# Patient Record
Sex: Male | Born: 1980 | Race: White | Hispanic: No | Smoking: Current every day smoker
Health system: Southern US, Community
[De-identification: ages and names within clinical notes are randomized; demographics above are authoritative.]

## PROBLEM LIST (undated history)

## (undated) ENCOUNTER — Emergency Department (HOSPITAL_COMMUNITY): Admission: EM | Payer: Medicaid Other | Source: Home / Self Care

## (undated) DIAGNOSIS — I10 Essential (primary) hypertension: Secondary | ICD-10-CM

## (undated) DIAGNOSIS — I213 ST elevation (STEMI) myocardial infarction of unspecified site: Secondary | ICD-10-CM

## (undated) DIAGNOSIS — E782 Mixed hyperlipidemia: Secondary | ICD-10-CM

## (undated) DIAGNOSIS — J45909 Unspecified asthma, uncomplicated: Secondary | ICD-10-CM

## (undated) DIAGNOSIS — I2510 Atherosclerotic heart disease of native coronary artery without angina pectoris: Secondary | ICD-10-CM

## (undated) DIAGNOSIS — C801 Malignant (primary) neoplasm, unspecified: Secondary | ICD-10-CM

## (undated) HISTORY — DX: Essential (primary) hypertension: I10

## (undated) HISTORY — PX: SPLENECTOMY: SUR1306

## (undated) HISTORY — DX: Mixed hyperlipidemia: E78.2

## (undated) HISTORY — PX: BACK SURGERY: SHX140

## (undated) HISTORY — PX: CARDIAC CATHETERIZATION: SHX172

## (undated) HISTORY — DX: Atherosclerotic heart disease of native coronary artery without angina pectoris: I25.10

---

## 2003-12-29 ENCOUNTER — Emergency Department (HOSPITAL_COMMUNITY): Admission: EM | Admit: 2003-12-29 | Discharge: 2003-12-29 | Payer: Self-pay | Admitting: Emergency Medicine

## 2004-06-20 ENCOUNTER — Emergency Department (HOSPITAL_COMMUNITY): Admission: EM | Admit: 2004-06-20 | Discharge: 2004-06-20 | Payer: Self-pay | Admitting: Emergency Medicine

## 2004-08-01 ENCOUNTER — Emergency Department (HOSPITAL_COMMUNITY): Admission: EM | Admit: 2004-08-01 | Discharge: 2004-08-01 | Payer: Self-pay | Admitting: Emergency Medicine

## 2004-10-21 ENCOUNTER — Emergency Department (HOSPITAL_COMMUNITY): Admission: EM | Admit: 2004-10-21 | Discharge: 2004-10-22 | Payer: Self-pay | Admitting: *Deleted

## 2005-04-06 ENCOUNTER — Emergency Department (HOSPITAL_COMMUNITY): Admission: EM | Admit: 2005-04-06 | Discharge: 2005-04-06 | Payer: Self-pay | Admitting: *Deleted

## 2005-07-22 ENCOUNTER — Emergency Department (HOSPITAL_COMMUNITY): Admission: EM | Admit: 2005-07-22 | Discharge: 2005-07-22 | Payer: Self-pay | Admitting: Emergency Medicine

## 2005-07-26 ENCOUNTER — Emergency Department (HOSPITAL_COMMUNITY): Admission: EM | Admit: 2005-07-26 | Discharge: 2005-07-26 | Payer: Self-pay | Admitting: Emergency Medicine

## 2005-07-29 ENCOUNTER — Emergency Department (HOSPITAL_COMMUNITY): Admission: EM | Admit: 2005-07-29 | Discharge: 2005-07-29 | Payer: Self-pay | Admitting: Emergency Medicine

## 2005-08-03 ENCOUNTER — Emergency Department (HOSPITAL_COMMUNITY): Admission: EM | Admit: 2005-08-03 | Discharge: 2005-08-03 | Payer: Self-pay | Admitting: Emergency Medicine

## 2005-11-15 ENCOUNTER — Emergency Department (HOSPITAL_COMMUNITY): Admission: EM | Admit: 2005-11-15 | Discharge: 2005-11-15 | Payer: Self-pay | Admitting: Emergency Medicine

## 2005-11-22 ENCOUNTER — Emergency Department (HOSPITAL_COMMUNITY): Admission: EM | Admit: 2005-11-22 | Discharge: 2005-11-23 | Payer: Self-pay | Admitting: Emergency Medicine

## 2008-06-22 ENCOUNTER — Emergency Department (HOSPITAL_COMMUNITY): Admission: EM | Admit: 2008-06-22 | Discharge: 2008-06-22 | Payer: Self-pay | Admitting: Emergency Medicine

## 2009-05-20 ENCOUNTER — Emergency Department (HOSPITAL_COMMUNITY): Admission: EM | Admit: 2009-05-20 | Discharge: 2009-05-20 | Payer: Self-pay | Admitting: Emergency Medicine

## 2009-05-27 ENCOUNTER — Emergency Department (HOSPITAL_COMMUNITY): Admission: EM | Admit: 2009-05-27 | Discharge: 2009-05-27 | Payer: Self-pay | Admitting: Emergency Medicine

## 2009-09-14 ENCOUNTER — Emergency Department (HOSPITAL_COMMUNITY): Admission: EM | Admit: 2009-09-14 | Discharge: 2009-09-14 | Payer: Self-pay | Admitting: Emergency Medicine

## 2010-04-21 ENCOUNTER — Ambulatory Visit (HOSPITAL_COMMUNITY): Admission: RE | Admit: 2010-04-21 | Discharge: 2010-04-21 | Payer: Self-pay | Admitting: Neurology

## 2010-06-14 ENCOUNTER — Inpatient Hospital Stay (HOSPITAL_COMMUNITY): Admission: RE | Admit: 2010-06-14 | Discharge: 2010-06-16 | Payer: Self-pay | Admitting: Neurosurgery

## 2010-09-16 ENCOUNTER — Emergency Department (HOSPITAL_COMMUNITY)
Admission: EM | Admit: 2010-09-16 | Discharge: 2010-09-16 | Payer: Self-pay | Source: Home / Self Care | Admitting: Emergency Medicine

## 2010-09-19 ENCOUNTER — Emergency Department (HOSPITAL_COMMUNITY)
Admission: EM | Admit: 2010-09-19 | Discharge: 2010-09-19 | Payer: Self-pay | Source: Home / Self Care | Admitting: Emergency Medicine

## 2010-12-31 LAB — CULTURE, BLOOD (ROUTINE X 2)
Culture: NO GROWTH
Culture: NO GROWTH

## 2010-12-31 LAB — CBC
HCT: 40.9 % (ref 39.0–52.0)
HCT: 45.3 % (ref 39.0–52.0)
Hemoglobin: 13.9 g/dL (ref 13.0–17.0)
Hemoglobin: 15.6 g/dL (ref 13.0–17.0)
MCH: 32.1 pg (ref 26.0–34.0)
MCH: 32.4 pg (ref 26.0–34.0)
MCHC: 34 g/dL (ref 30.0–36.0)
MCHC: 34.4 g/dL (ref 30.0–36.0)
MCV: 94.2 fL (ref 78.0–100.0)
MCV: 94.5 fL (ref 78.0–100.0)
Platelets: 258 10*3/uL (ref 150–400)
Platelets: 279 10*3/uL (ref 150–400)
RBC: 4.33 MIL/uL (ref 4.22–5.81)
RBC: 4.81 MIL/uL (ref 4.22–5.81)
RDW: 14.1 % (ref 11.5–15.5)
RDW: 14.1 % (ref 11.5–15.5)
WBC: 12.5 10*3/uL — ABNORMAL HIGH (ref 4.0–10.5)
WBC: 19 10*3/uL — ABNORMAL HIGH (ref 4.0–10.5)

## 2010-12-31 LAB — URINALYSIS, ROUTINE W REFLEX MICROSCOPIC
Bilirubin Urine: NEGATIVE
Glucose, UA: NEGATIVE mg/dL
Ketones, ur: NEGATIVE mg/dL
Leukocytes, UA: NEGATIVE
Nitrite: NEGATIVE
Protein, ur: 30 mg/dL — AB
Specific Gravity, Urine: 1.015 (ref 1.005–1.030)
Urobilinogen, UA: 1 mg/dL (ref 0.0–1.0)
pH: 7.5 (ref 5.0–8.0)

## 2010-12-31 LAB — URINE MICROSCOPIC-ADD ON

## 2010-12-31 LAB — SURGICAL PCR SCREEN
MRSA, PCR: NEGATIVE
Staphylococcus aureus: NEGATIVE

## 2010-12-31 LAB — BASIC METABOLIC PANEL
BUN: 4 mg/dL — ABNORMAL LOW (ref 6–23)
CO2: 23 mEq/L (ref 19–32)
Calcium: 8.5 mg/dL (ref 8.4–10.5)
Chloride: 107 mEq/L (ref 96–112)
Creatinine, Ser: 0.84 mg/dL (ref 0.4–1.5)
GFR calc Af Amer: >60 mL/min (ref >60)
GFR calc non Af Amer: >60 mL/min (ref >60)
Glucose, Bld: 124 mg/dL — ABNORMAL HIGH (ref 70–99)
Potassium: 3.5 mEq/L (ref 3.5–5.1)
Sodium: 135 mEq/L (ref 135–145)

## 2010-12-31 LAB — TYPE AND SCREEN
ABO/RH(D): O POS
Antibody Screen: NEGATIVE

## 2010-12-31 LAB — ABO/RH: ABO/RH(D): O POS

## 2011-02-07 ENCOUNTER — Emergency Department (HOSPITAL_COMMUNITY)
Admission: EM | Admit: 2011-02-07 | Discharge: 2011-02-08 | Disposition: A | Discharge status: Home / Self Care | Payer: Medicaid Other | Attending: Emergency Medicine | Admitting: Emergency Medicine

## 2011-02-07 ENCOUNTER — Emergency Department (HOSPITAL_COMMUNITY): Payer: Medicaid Other

## 2011-02-07 DIAGNOSIS — E669 Obesity, unspecified: Secondary | ICD-10-CM | POA: Insufficient documentation

## 2011-02-07 DIAGNOSIS — R091 Pleurisy: Secondary | ICD-10-CM | POA: Insufficient documentation

## 2011-02-07 DIAGNOSIS — J45909 Unspecified asthma, uncomplicated: Secondary | ICD-10-CM | POA: Insufficient documentation

## 2011-04-04 ENCOUNTER — Ambulatory Visit: Payer: Medicaid Other | Attending: Neurology

## 2011-04-04 DIAGNOSIS — G4730 Sleep apnea, unspecified: Secondary | ICD-10-CM | POA: Insufficient documentation

## 2011-04-04 DIAGNOSIS — G4710 Hypersomnia, unspecified: Secondary | ICD-10-CM | POA: Insufficient documentation

## 2011-04-04 DIAGNOSIS — Z6836 Body mass index (BMI) 36.0-36.9, adult: Secondary | ICD-10-CM | POA: Insufficient documentation

## 2011-04-06 NOTE — Procedures (Signed)
Nathan Waters, Nathan Waters               ACCOUNT NO.:  [id number]  MEDICAL RECORD NO.:  [id number]          PATIENT TYPE:  OUT  LOCATION:  SLEEP LAB                     FACILITY:  [facility]  PHYSICIAN:  Neleh Muldoon A. Gerilyn Pilgrim, M.D. DATE OF BIRTH:  24-Aug-1981  DATE OF STUDY:  04/04/2011                           NOCTURNAL POLYSOMNOGRAM  REFERRING PHYSICIAN:  Keva Darty  INDICATION FOR STUDY:  A 30 year old presents with snoring, fatigue, and insomnia.  This study has been done to evaluate for obstructive sleep apnea syndrome.  EPWORTH SLEEPINESS SCORE: 1. BMI 36.  MEDICATIONS:  Fioricet, Ventolin, Robaxin, and hydrocodone.  SLEEP ARCHITECTURE:  The total recording time is 401 minutes.  The sleep efficiency is 27.9%.  Sleep latency 36.5 minutes.  Sleep latency 0. Stage N1 9.4%, N2 46%, N3 44.6%, and REM sleep 0%.  RESPIRATORY DATA:  Baseline oxygen saturation 95, lowest saturation 89. Diagnostic AHI 0.  OXYGEN DATA:  CARDIAC DATA:  Average heart rate is 64 with no significant dysrhythmias observed.  MOVEMENT-PARASOMNIA:  PLM index 0.  IMPRESSIONS-RECOMMENDATIONS:  Abnormal sleep architecture with very poor sleep efficiency and no REM sleep.     Kerrigan Glendening A. Gerilyn Pilgrim, M.D. Electronically Signed 04/07/2011 09:33:40    KAD/MEDQ  D:  04/06/2011 07:43:40  T:  04/06/2011 08:31:38  Job:  [id number]

## 2011-04-19 ENCOUNTER — Other Ambulatory Visit: Payer: Self-pay | Admitting: Internal Medicine

## 2011-04-19 ENCOUNTER — Emergency Department (HOSPITAL_COMMUNITY)
Admission: EM | Admit: 2011-04-19 | Discharge: 2011-04-19 | Disposition: A | Discharge status: Home / Self Care | Payer: Medicaid Other | Attending: Emergency Medicine | Admitting: Emergency Medicine

## 2011-04-19 DIAGNOSIS — R51 Headache: Secondary | ICD-10-CM | POA: Insufficient documentation

## 2011-06-24 ENCOUNTER — Emergency Department (HOSPITAL_COMMUNITY)
Admission: EM | Admit: 2011-06-24 | Discharge: 2011-06-24 | Disposition: A | Discharge status: Home / Self Care | Payer: Medicaid Other | Attending: Emergency Medicine | Admitting: Emergency Medicine

## 2011-06-24 ENCOUNTER — Encounter: Payer: Self-pay | Admitting: *Deleted

## 2011-06-24 DIAGNOSIS — L02419 Cutaneous abscess of limb, unspecified: Secondary | ICD-10-CM | POA: Insufficient documentation

## 2011-06-24 DIAGNOSIS — F17200 Nicotine dependence, unspecified, uncomplicated: Secondary | ICD-10-CM | POA: Insufficient documentation

## 2011-06-24 MED ORDER — HYDROCODONE-ACETAMINOPHEN 5-325 MG PO TABS
2.0000 | ORAL_TABLET | Freq: Once | ORAL | Status: AC
  Administered 2011-06-24: 2 via ORAL
  Filled 2011-06-24: qty 2

## 2011-06-24 MED ORDER — SULFAMETHOXAZOLE-TRIMETHOPRIM 800-160 MG PO TABS: 1.0000 | ORAL_TABLET | Freq: Two times a day (BID) | ORAL | Status: DC

## 2011-06-24 MED ORDER — HYDROCODONE-ACETAMINOPHEN 5-325 MG PO TABS: ORAL_TABLET | ORAL | Status: DC

## 2011-06-24 MED ORDER — SULFAMETHOXAZOLE-TRIMETHOPRIM 800-160 MG PO TABS: 1.0000 | ORAL_TABLET | Freq: Two times a day (BID) | ORAL | Status: AC

## 2011-06-24 MED ORDER — CEPHALEXIN 500 MG PO CAPS: 500.0000 mg | ORAL_CAPSULE | Freq: Four times a day (QID) | ORAL | Status: AC

## 2011-06-24 MED ORDER — SULFAMETHOXAZOLE-TMP DS 800-160 MG PO TABS
1.0000 | ORAL_TABLET | Freq: Once | ORAL | Status: AC
  Administered 2011-06-24: 1 via ORAL
  Filled 2011-06-24: qty 1

## 2011-06-24 MED ORDER — HYDROCODONE-ACETAMINOPHEN 5-325 MG PO TABS: 2.0000 | ORAL_TABLET | ORAL | Status: DC | PRN

## 2011-06-24 MED ORDER — CEFTRIAXONE SODIUM 1 G IJ SOLR
1.0000 g | Freq: Once | INTRAMUSCULAR | Status: AC
  Administered 2011-06-24: 1 g via INTRAMUSCULAR
  Filled 2011-06-24: qty 1

## 2011-06-24 MED ORDER — ONDANSETRON HCL 4 MG PO TABS
4.0000 mg | ORAL_TABLET | Freq: Once | ORAL | Status: AC
  Administered 2011-06-24: 4 mg via ORAL
  Filled 2011-06-24: qty 1

## 2011-06-24 MED ORDER — CEPHALEXIN 500 MG PO CAPS: 500.0000 mg | ORAL_CAPSULE | Freq: Four times a day (QID) | ORAL | Status: DC

## 2011-06-24 NOTE — ED Notes (Signed)
C/o abscess to right inner thigh x 3 days

## 2011-06-24 NOTE — ED Provider Notes (Addendum)
Medical screening examination/treatment/procedure(s) were performed by non-physician practitioner and as supervising physician I was immediately available for consultation/collaboration.   Anayely Constantine, MD 07/01/11 1223 

## 2011-06-24 NOTE — ED Provider Notes (Signed)
History     CSN: [id number] Arrival date & time: 06/24/2011  5:01 PM  Chief Complaint  Patient presents with  . Abscess    right thigh   Patient is a 30 y.o. male presenting with abscess. The history is provided by the patient.  Abscess  This is a new problem. The current episode started less than one week ago. The onset was gradual. The problem occurs continuously. The problem has been gradually worsening. The abscess is present on the right upper leg. The problem is moderate. The abscess is characterized by painfulness and redness. Pertinent negatives include no decrease in physical activity, no fever, no vomiting and no cough. His past medical history is significant for skin abscesses in family. He has received no recent medical care.    Past Medical History  Diagnosis Date  . Migraine     Past Surgical History  Procedure Date  . Splenectomy   . Back surgery     No family history on file.  History  Substance Use Topics  . Smoking status: Current Everyday Smoker -- 1.0 packs/day  . Smokeless tobacco: Not on file  . Alcohol Use: No      Review of Systems  Constitutional: Negative for fever and activity change.       All ROS Neg except as noted in HPI  HENT: Negative for nosebleeds and neck pain.   Eyes: Negative for photophobia and discharge.  Respiratory: Negative for cough, shortness of breath and wheezing.   Cardiovascular: Negative for chest pain and palpitations.  Gastrointestinal: Negative for vomiting, abdominal pain and blood in stool.  Genitourinary: Negative for dysuria, frequency and hematuria.  Musculoskeletal: Negative for back pain and arthralgias.  Skin: Negative.        abscess  Neurological: Negative for dizziness, seizures and speech difficulty.  Psychiatric/Behavioral: Negative for hallucinations and confusion.    Physical Exam  BP 130/91  Pulse 108  Temp(Src) 98.3 F (36.8 C) (Oral)  Resp 18  Ht 5\' 9"  (1.753 m)  Wt 255 lb (115.667 kg)   BMI 37.66 kg/m2  SpO2 99%  Physical Exam  Nursing note and vitals reviewed. Constitutional: He is oriented to person, place, and time. He appears well-developed and well-nourished.  Non-toxic appearance.  HENT:  Head: Normocephalic.  Right Ear: Tympanic membrane and external ear normal.  Left Ear: Tympanic membrane and external ear normal.  Eyes: EOM and lids are normal. Pupils are equal, round, and reactive to light.  Neck: Normal range of motion. Neck supple. Carotid bruit is not present.  Cardiovascular: Regular rhythm, normal heart sounds, intact distal pulses and normal pulses.  Tachycardia present.   Pulmonary/Chest: Breath sounds normal. No respiratory distress.  Abdominal: Soft. Bowel sounds are normal. There is no tenderness. There is no guarding.  Musculoskeletal: Normal range of motion.  Lymphadenopathy:       Head (right side): No submandibular adenopathy present.       Head (left side): No submandibular adenopathy present.    He has no cervical adenopathy.  Neurological: He is alert and oriented to person, place, and time. He has normal strength. No cranial nerve deficit or sensory deficit.  Skin: Skin is warm and dry.       Moderate size abscess of the right inner thigh with moderate increase redness. Painful to touch. Warm but not hot. No red streaks.  Psychiatric: He has a normal mood and affect. His speech is normal.    ED Course: I and D suggested. Pt  states it is too painful at this time an he would like a surgical consult for the abscess. Rx for bactrim DS, cephalexin, and Norco given. Referal to Dr  Lovell Sheehan given at pt's request.  Procedures  MDM I have reviewed nursing notes, vital signs, and all appropriate lab and imaging results for this patient.      Kathie Dike, [place] 06/24/11 1733

## 2011-11-21 ENCOUNTER — Other Ambulatory Visit: Payer: Self-pay

## 2011-11-21 ENCOUNTER — Encounter (HOSPITAL_COMMUNITY): Payer: Self-pay | Admitting: *Deleted

## 2011-11-21 ENCOUNTER — Encounter (HOSPITAL_COMMUNITY)
Admission: EM | Disposition: A | Discharge status: Other Acute Inpatient Hospital | Payer: Self-pay | Source: Home / Self Care | Attending: Emergency Medicine

## 2011-11-21 ENCOUNTER — Ambulatory Visit (HOSPITAL_COMMUNITY): Admit: 2011-11-21 | Payer: Self-pay | Admitting: Cardiology

## 2011-11-21 ENCOUNTER — Emergency Department (HOSPITAL_COMMUNITY)
Admission: EM | Admit: 2011-11-21 | Discharge: 2011-11-21 | Disposition: A | Discharge status: Other Acute Inpatient Hospital | Payer: Medicaid Other | Source: Home / Self Care | Attending: Emergency Medicine | Admitting: Emergency Medicine

## 2011-11-21 ENCOUNTER — Inpatient Hospital Stay (HOSPITAL_COMMUNITY)
Admission: EM | Admit: 2011-11-21 | Discharge: 2011-11-24 | DRG: 249 | Disposition: A | Discharge status: Home / Self Care | Payer: Medicaid Other | Source: Ambulatory Visit | Attending: Cardiology | Admitting: Cardiology

## 2011-11-21 ENCOUNTER — Encounter (HOSPITAL_COMMUNITY)
Admission: EM | Disposition: A | Discharge status: Home / Self Care | Payer: Self-pay | Source: Ambulatory Visit | Attending: Cardiology

## 2011-11-21 ENCOUNTER — Emergency Department (HOSPITAL_COMMUNITY): Payer: Medicaid Other

## 2011-11-21 DIAGNOSIS — I252 Old myocardial infarction: Secondary | ICD-10-CM

## 2011-11-21 DIAGNOSIS — E876 Hypokalemia: Secondary | ICD-10-CM

## 2011-11-21 DIAGNOSIS — I4891 Unspecified atrial fibrillation: Secondary | ICD-10-CM

## 2011-11-21 DIAGNOSIS — J45909 Unspecified asthma, uncomplicated: Secondary | ICD-10-CM

## 2011-11-21 DIAGNOSIS — I2129 ST elevation (STEMI) myocardial infarction involving other sites: Principal | ICD-10-CM | POA: Diagnosis present

## 2011-11-21 DIAGNOSIS — R079 Chest pain, unspecified: Secondary | ICD-10-CM

## 2011-11-21 DIAGNOSIS — D72829 Elevated white blood cell count, unspecified: Secondary | ICD-10-CM

## 2011-11-21 DIAGNOSIS — I2510 Atherosclerotic heart disease of native coronary artery without angina pectoris: Secondary | ICD-10-CM | POA: Diagnosis present

## 2011-11-21 DIAGNOSIS — I213 ST elevation (STEMI) myocardial infarction of unspecified site: Secondary | ICD-10-CM

## 2011-11-21 DIAGNOSIS — F17200 Nicotine dependence, unspecified, uncomplicated: Secondary | ICD-10-CM | POA: Diagnosis present

## 2011-11-21 HISTORY — PX: LEFT HEART CATHETERIZATION WITH CORONARY ANGIOGRAM: SHX5451

## 2011-11-21 HISTORY — PX: PERCUTANEOUS CORONARY STENT INTERVENTION (PCI-S): SHX5485

## 2011-11-21 HISTORY — DX: ST elevation (STEMI) myocardial infarction of unspecified site: I21.3

## 2011-11-21 LAB — BASIC METABOLIC PANEL
BUN: 9 mg/dL (ref 6–23)
BUN: 5 mg/dL — ABNORMAL LOW (ref 6–23)
CO2: 23 mEq/L (ref 19–32)
Calcium: 8.8 mg/dL (ref 8.4–10.5)
Chloride: 106 mEq/L (ref 96–112)
Creatinine, Ser: 0.87 mg/dL (ref 0.50–1.35)
GFR calc Af Amer: >90 mL/min (ref >90)
Glucose, Bld: 154 mg/dL — ABNORMAL HIGH (ref 70–99)
Glucose, Bld: 110 mg/dL — ABNORMAL HIGH (ref 70–99)
Sodium: 140 mEq/L (ref 135–145)

## 2011-11-21 LAB — HEMOGLOBIN A1C
Hgb A1c MFr Bld: 5.7 % — ABNORMAL HIGH (ref <5.7)
Mean Plasma Glucose: 117 mg/dL — ABNORMAL HIGH (ref <117)

## 2011-11-21 LAB — MAGNESIUM: Magnesium: 2.1 mg/dL (ref 1.5–2.5)

## 2011-11-21 LAB — CBC
HCT: 44.3 % (ref 39.0–52.0)
Hemoglobin: 15.1 g/dL (ref 13.0–17.0)
MCHC: 34.1 g/dL (ref 30.0–36.0)
MCV: 92.9 fL (ref 78.0–100.0)
RDW: 14.1 % (ref 11.5–15.5)
WBC: 13.1 10*3/uL — ABNORMAL HIGH (ref 4.0–10.5)

## 2011-11-21 LAB — LIPID PANEL
HDL: 32 mg/dL — ABNORMAL LOW (ref >39)
LDL Cholesterol: 84 mg/dL (ref 0–99)

## 2011-11-21 LAB — URINALYSIS, ROUTINE W REFLEX MICROSCOPIC
Bilirubin Urine: NEGATIVE
Ketones, ur: NEGATIVE mg/dL
Nitrite: NEGATIVE
Protein, ur: NEGATIVE mg/dL
Urobilinogen, UA: 1 mg/dL (ref 0.0–1.0)

## 2011-11-21 SURGERY — LEFT HEART CATHETERIZATION WITH CORONARY ANGIOGRAM
Anesthesia: LOCAL

## 2011-11-21 MED ORDER — INFLUENZA VIRUS VACC SPLIT PF IM SUSP
0.5000 mL | INTRAMUSCULAR | Status: AC
  Administered 2011-11-22: 0.5 mL via INTRAMUSCULAR
  Filled 2011-11-21: qty 0.5

## 2011-11-21 MED ORDER — MIDAZOLAM HCL 2 MG/2ML IJ SOLN
INTRAMUSCULAR | Status: AC
  Filled 2011-11-21: qty 2

## 2011-11-21 MED ORDER — BIVALIRUDIN 250 MG IV SOLR
INTRAVENOUS | Status: AC
  Filled 2011-11-21: qty 250

## 2011-11-21 MED ORDER — NITROGLYCERIN 0.2 MG/ML ON CALL CATH LAB
INTRAVENOUS | Status: AC
  Filled 2011-11-21: qty 1

## 2011-11-21 MED ORDER — HEPARIN (PORCINE) IN NACL 2-0.9 UNIT/ML-% IJ SOLN
INTRAMUSCULAR | Status: AC
  Filled 2011-11-21: qty 2000

## 2011-11-21 MED ORDER — LIDOCAINE HCL (PF) 1 % IJ SOLN
INTRAMUSCULAR | Status: AC
  Filled 2011-11-21: qty 30

## 2011-11-21 MED ORDER — PNEUMOCOCCAL VAC POLYVALENT 25 MCG/0.5ML IJ INJ
0.5000 mL | INJECTION | INTRAMUSCULAR | Status: AC
  Administered 2011-11-22: 0.5 mL via INTRAMUSCULAR
  Filled 2011-11-21: qty 0.5

## 2011-11-21 MED ORDER — ROSUVASTATIN CALCIUM 40 MG PO TABS
40.0000 mg | ORAL_TABLET | Freq: Every day | ORAL | Status: DC
  Administered 2011-11-21 – 2011-11-23 (×3): 40 mg via ORAL
  Filled 2011-11-21 (×4): qty 1

## 2011-11-21 MED ORDER — SODIUM CHLORIDE 0.9 % IV SOLN: INTRAVENOUS | Status: AC

## 2011-11-21 MED ORDER — NITROGLYCERIN IN D5W 200-5 MCG/ML-% IV SOLN: 5.0000 ug/min | Freq: Once | INTRAVENOUS | Status: DC

## 2011-11-21 MED ORDER — ONDANSETRON HCL 4 MG/2ML IJ SOLN
4.0000 mg | Freq: Four times a day (QID) | INTRAMUSCULAR | Status: DC | PRN
  Administered 2011-11-21 – 2011-11-22 (×2): 4 mg via INTRAVENOUS
  Filled 2011-11-21 (×2): qty 2

## 2011-11-21 MED ORDER — BUTALBITAL-APAP-CAFF-COD 50-325-40-30 MG PO CAPS: 2.0000 | ORAL_CAPSULE | ORAL | Status: DC | PRN

## 2011-11-21 MED ORDER — POTASSIUM CHLORIDE CRYS ER 20 MEQ PO TBCR: 40.0000 meq | EXTENDED_RELEASE_TABLET | Freq: Once | ORAL | Status: DC

## 2011-11-21 MED ORDER — HYDROMORPHONE HCL PF 1 MG/ML IJ SOLN
1.0000 mg | Freq: Once | INTRAMUSCULAR | Status: AC
  Administered 2011-11-21: 1 mg via INTRAVENOUS
  Filled 2011-11-21: qty 1

## 2011-11-21 MED ORDER — ONDANSETRON HCL 4 MG/2ML IJ SOLN: 4.0000 mg | Freq: Four times a day (QID) | INTRAMUSCULAR | Status: DC | PRN

## 2011-11-21 MED ORDER — FENTANYL CITRATE 0.05 MG/ML IJ SOLN
INTRAMUSCULAR | Status: AC
  Filled 2011-11-21: qty 2

## 2011-11-21 MED ORDER — SODIUM CHLORIDE 0.9 % IV SOLN: INTRAVENOUS | Status: DC

## 2011-11-21 MED ORDER — PRASUGREL HCL 10 MG PO TABS: 10.0000 mg | ORAL_TABLET | Freq: Every day | ORAL | Status: DC

## 2011-11-21 MED ORDER — PANTOPRAZOLE SODIUM 40 MG IV SOLR: 40.0000 mg | Freq: Once | INTRAVENOUS | Status: DC

## 2011-11-21 MED ORDER — TRAZODONE 25 MG HALF TABLET
25.0000 mg | ORAL_TABLET | Freq: Every evening | ORAL | Status: DC | PRN
  Filled 2011-11-21: qty 1

## 2011-11-21 MED ORDER — HEPARIN SOD (PORCINE) IN D5W 100 UNIT/ML IV SOLN: 1000.0000 [IU]/h | Freq: Once | INTRAVENOUS | Status: DC

## 2011-11-21 MED ORDER — METHOCARBAMOL 500 MG PO TABS
500.0000 mg | ORAL_TABLET | Freq: Three times a day (TID) | ORAL | Status: DC | PRN
  Filled 2011-11-21: qty 1

## 2011-11-21 MED ORDER — ASPIRIN 81 MG PO CHEW: 81.0000 mg | CHEWABLE_TABLET | Freq: Every day | ORAL | Status: DC

## 2011-11-21 MED ORDER — POTASSIUM CHLORIDE CRYS ER 20 MEQ PO TBCR
EXTENDED_RELEASE_TABLET | ORAL | Status: AC
  Filled 2011-11-21: qty 2

## 2011-11-21 MED ORDER — ACETAMINOPHEN 325 MG PO TABS
650.0000 mg | ORAL_TABLET | ORAL | Status: DC | PRN
  Administered 2011-11-21: 650 mg via ORAL
  Filled 2011-11-21: qty 2

## 2011-11-21 MED ORDER — HEPARIN BOLUS VIA INFUSION
4000.0000 [IU] | Freq: Once | INTRAVENOUS | Status: AC
  Administered 2011-11-21: 4000 [IU] via INTRAVENOUS

## 2011-11-21 MED ORDER — ACETAMINOPHEN 325 MG PO TABS: 650.0000 mg | ORAL_TABLET | ORAL | Status: DC | PRN

## 2011-11-21 MED ORDER — ONDANSETRON HCL 4 MG PO TABS: 4.0000 mg | ORAL_TABLET | Freq: Four times a day (QID) | ORAL | Status: DC | PRN

## 2011-11-21 MED ORDER — VERAPAMIL HCL 2.5 MG/ML IV SOLN
INTRAVENOUS | Status: AC
  Filled 2011-11-21: qty 2

## 2011-11-21 MED ORDER — PRASUGREL HCL 10 MG PO TABS
ORAL_TABLET | ORAL | Status: AC
  Administered 2011-11-22: 10 mg via ORAL
  Filled 2011-11-21: qty 6

## 2011-11-21 MED ORDER — ROSUVASTATIN CALCIUM 40 MG PO TABS: 40.0000 mg | ORAL_TABLET | Freq: Every day | ORAL | Status: DC

## 2011-11-21 MED ORDER — PRASUGREL HCL 10 MG PO TABS
10.0000 mg | ORAL_TABLET | Freq: Every day | ORAL | Status: DC
Start: 2011-11-21 — End: 2011-11-24
  Administered 2011-11-21 – 2011-11-24 (×4): 10 mg via ORAL
  Filled 2011-11-21 (×4): qty 1

## 2011-11-21 MED ORDER — ONDANSETRON HCL 4 MG/2ML IJ SOLN
4.0000 mg | Freq: Once | INTRAMUSCULAR | Status: AC
  Administered 2011-11-21: 4 mg via INTRAVENOUS
  Filled 2011-11-21: qty 2

## 2011-11-21 MED ORDER — METHOCARBAMOL 500 MG PO TABS
500.0000 mg | ORAL_TABLET | Freq: Three times a day (TID) | ORAL | Status: DC | PRN
Start: 2011-11-21 — End: 2011-11-21

## 2011-11-21 MED ORDER — NICOTINE 21 MG/24HR TD PT24
21.0000 mg | MEDICATED_PATCH | Freq: Every day | TRANSDERMAL | Status: DC
  Administered 2011-11-21 – 2011-11-24 (×4): 21 mg via TRANSDERMAL
  Filled 2011-11-21 (×4): qty 1

## 2011-11-21 MED ORDER — ALBUTEROL SULFATE HFA 108 (90 BASE) MCG/ACT IN AERS
2.0000 | INHALATION_SPRAY | RESPIRATORY_TRACT | Status: DC | PRN
  Administered 2011-11-23: 2 via RESPIRATORY_TRACT
  Filled 2011-11-21: qty 6.7

## 2011-11-21 MED ORDER — DEXTROSE 5 % IV SOLN
150.0000 mg | INTRAVENOUS | Status: DC | PRN
  Administered 2011-11-21: 150 mg via INTRAVENOUS

## 2011-11-21 MED ORDER — METOPROLOL TARTRATE 50 MG PO TABS
50.0000 mg | ORAL_TABLET | Freq: Two times a day (BID) | ORAL | Status: DC
  Administered 2011-11-21 – 2011-11-24 (×6): 50 mg via ORAL
  Filled 2011-11-21 (×8): qty 1

## 2011-11-21 MED ORDER — NITROGLYCERIN IN D5W 200-5 MCG/ML-% IV SOLN
INTRAVENOUS | Status: AC
  Administered 2011-11-21: 50000 ug
  Filled 2011-11-21: qty 250

## 2011-11-21 MED ORDER — POTASSIUM CHLORIDE 20 MEQ PO PACK
40.0000 meq | PACK | Freq: Once | ORAL | Status: DC
  Administered 2011-11-21: 40 meq via ORAL

## 2011-11-21 MED ORDER — HEPARIN SOD (PORCINE) IN D5W 100 UNIT/ML IV SOLN
INTRAVENOUS | Status: AC
  Filled 2011-11-21: qty 250

## 2011-11-21 MED ORDER — ASPIRIN EC 81 MG PO TBEC
81.0000 mg | DELAYED_RELEASE_TABLET | Freq: Every day | ORAL | Status: DC
  Administered 2011-11-22 – 2011-11-24 (×3): 81 mg via ORAL
  Filled 2011-11-21 (×4): qty 1

## 2011-11-21 MED ORDER — BUTALBITAL-APAP-CAFFEINE 50-325-40 MG PO TABS
2.0000 | ORAL_TABLET | ORAL | Status: DC | PRN
  Filled 2011-11-21: qty 2

## 2011-11-21 MED ORDER — SODIUM CHLORIDE 0.9 % IV BOLUS (SEPSIS)
250.0000 mL | Freq: Once | INTRAVENOUS | Status: AC
  Administered 2011-11-21: 250 mL via INTRAVENOUS

## 2011-11-21 MED FILL — Medication: Qty: 1 | Status: AC

## 2011-11-21 NOTE — Progress Notes (Signed)
   CARE MANAGEMENT NOTE 11/21/2011  Patient:  Nathan Waters, Nathan Waters   Account Number:  [id number]  Date Initiated:  11/21/2011  Documentation initiated by:  GRAVES-BIGELOW,Haniya Fern  Subjective/Objective Assessment:   Pt admitted with chest pain- s/p   Left Heart Cath. Pt is from [facility]. Currently has medicaid and gets medications from [facility] in [place]. CM will give pt 30 day free effeint card.     Action/Plan:   MD please write rx for 30 day free effient no refills and then the original with refills. Effient will need prior authorization please call [phone]. Medicaid # is [id number] Q.   Anticipated DC Date:  11/25/2011   Anticipated DC Plan:  HOME/SELF CARE      DC Planning Services  CM consult  Medication Assistance      Choice offered to / List presented to:             Status of service:  Completed, signed off Medicare Important Message given?   (If response is "NO", the following Medicare IM given date fields will be blank) Date Medicare IM given:   Date Additional Medicare IM given:    Discharge Disposition:  HOME/SELF CARE  Per UR Regulation:    Comments:  11-21-11 [address] Tomi Bamberger, RN,BSN [phone] No other needs assessed by CM at this time.

## 2011-11-21 NOTE — ED Notes (Signed)
Pt states this a.m. While at home he had x1 episode of vomiting approx 45 minutes ago - afterwards pt began experiencing substernal chest pain, pt describes as sharp and pain increases w/ movement/inspiration. Pt appears pale in color. A&Ox4 admits to some dizziness.

## 2011-11-21 NOTE — ED Notes (Signed)
CRITICAL VALUE ALERT  Critical value received:  Trop 0.47  Date of notification:  11/21/2011  Time of notification: 0726  Critical value read back:yes  Nurse who received alert:  Lilia Pro RN  MD notified (1st page):  Dr. Elbert Ewings  Time of first page:  0726  MD notified (2nd page):  Time of second page:  Responding MD:  Deretha Emory  Time MD responded:  [phone]

## 2011-11-21 NOTE — ED Notes (Signed)
[facility] called to inform them of [facility] departure with PT.

## 2011-11-21 NOTE — Code Documentation (Signed)
Family at beside. Family given emotional support. 

## 2011-11-21 NOTE — H&P (Signed)
CARDIOLOGY ADMISSION NOTE  Patient ID: ALGIS LEHENBAUER MRN: [id number] DOB/AGE: 1981/03/07 31 y.o.  Admit date: 11/21/2011 Primary Physician : none Primary Cardiologist : none Chief Complaint   : chest pain  HPI: Mr. Erbe is a 31 yo M with PMH significant for asthma and back surgery presents initially to [facility] for chest pain that started at 2:30AM on day of admission.  He then called EMS at 3:30AM.  He receives asa, dilaudid, nitro, heparin, amio and was also found to have A-fib RVR (133).  Patient also had a witnessed arrest with ventricular fibrillation. He describes chest pain as sharp, locates mid-sternal, 8/10 in severity, no radiation, ++associates with shortness of breath.  Exacerbation factor include movement.  He denies any similar episodes in the past.  Of note, patient has strong family history of heart disease including mother, father, and brother; however, patient could not specify exactly and at what age.    Past Medical History  Diagnosis Date  . Migraine Asthma     Past Surgical History  Procedure Date  . Splenectomy-for unclear etiology of splenomegaly -  . Back surgery     Allergies  Allergen Reactions  . Penicillins Anaphylaxis   Current Facility-Administered Medications on File Prior to Encounter  Medication Dose Route Frequency Provider Last Rate Last Dose  . heparin bolus via infusion 4,000 Units  4,000 Units Intravenous Once Shelda Jakes, MD   4,000 Units at 11/21/11 [phone]  . HYDROmorphone (DILAUDID) injection 1 mg  1 mg Intravenous Once Shelda Jakes, MD   1 mg at 11/21/11 0630  . nitroGLYCERIN 0.2 mg/mL in dextrose 5 % infusion      5 mL/hr at 11/21/11 0736 50,000 mcg at 11/21/11 0736  . ondansetron (ZOFRAN) injection 4 mg  4 mg Intravenous Once Shelda Jakes, MD   4 mg at 11/21/11 0630  . sodium chloride 0.9 % bolus 250 mL  250 mL Intravenous Once Shelda Jakes, MD   250 mL at 11/21/11 0630  . DISCONTD: 0.9 %  sodium  chloride infusion   Intravenous Continuous Shelda Jakes, MD      . DISCONTD: amiodarone (CORDARONE) 150 mg in dextrose 5 % 100 mL bolus  150 mg Intravenous PRN Shelda Jakes, MD   150 mg at 11/21/11 0714  . DISCONTD: heparin 100 UNIT/ML infusion           . DISCONTD: heparin ADULT infusion 100 units/ml (25000 units/250 ml)  1,000 Units/hr Intravenous Once Shelda Jakes, MD      . DISCONTD: nitroGLYCERIN 0.2 mg/mL in dextrose 5 % infusion  5-200 mcg/min Intravenous Once Shelda Jakes, MD      . DISCONTD: pantoprazole (PROTONIX) injection 40 mg  40 mg Intravenous Once Shelda Jakes, MD       Current Outpatient Prescriptions on File Prior to Encounter  Medication Sig Dispense Refill  . butalbital-acetaminophen-caffeine (FIORICET WITH CODEINE) 50-325-40-30 MG per capsule Take 2 capsules by mouth every 4 (four) hours as needed. For headache pain       . butalbital-acetaminophen-caffeine (FIORICET, ESGIC) 50-325-40 MG per tablet Take 2 tablets by mouth every 4 (four) hours as needed.        Marland Kitchen HYDROcodone-acetaminophen (NORCO) 5-325 MG per tablet 1 or 2 po q4h prn pain  20 tablet  0  . methocarbamol (ROBAXIN) 500 MG tablet Take 500 mg by mouth 3 (three) times daily as needed.  History   Social History: divorced, smokes 2 ppd, denies any alcohol or illicit drugs. Unemployed and has 2 children    Family history:  Father: Cancer, died from MI Mother: heart Disease Brother: heart disease at age 64 Grandmother: heart disease  Physical Exam: General: alert, well-developed, and cooperative to examination.  Head: normocephalic and atraumatic.  Eyes: vision grossly intact, pupils equal, pupils round, pupils reactive to light, no injection and anicteric.  Mouth: pharynx pink and moist, no erythema, and no exudates.  Neck: supple, full ROM, no thyromegaly, no JVD, and no carotid bruits.  Lungs: normal respiratory effort, no accessory muscle use, coarse breath sounds, no  crackles, and + mild wheezes. Heart:irregularly irregular, no murmur, no gallop, and no rub.  Abdomen: soft, non-tender, normal bowel sounds, no distention, no guarding, no rebound tenderness, no hepatomegaly Msk: no joint swelling, no joint warmth, and no redness over joints.  Pulses: 2+ DP/PT pulses bilaterally Extremities: No cyanosis, clubbing, edema Neurologic: alert & oriented X3, cranial nerves II-XII intact, strength normal in all extremities, sensation intact to light touch, and gait normal.  Skin: turgor normal and no rashes.  Psych: Oriented X3, memory intact for recent and remote, normally interactive, good eye contact, ++anxious appearing, and not depressed appearing.  Labs: Lab Results  Component Value Date   BUN 9 11/21/2011   Lab Results  Component Value Date   CREATININE 0.87 11/21/2011   Lab Results  Component Value Date   NA 138 11/21/2011   K 3.3* 11/21/2011   CL 106 11/21/2011   CO2 24 11/21/2011   Lab Results  Component Value Date   TROPONINI 0.47* 11/21/2011   Lab Results  Component Value Date   WBC 13.1* 11/21/2011   HGB 15.1 11/21/2011   HCT 44.3 11/21/2011   MCV 92.9 11/21/2011   PLT 274 11/21/2011   Radiology:  CXR on 11/21/11: Mild left lower lobe opacity and hemidiaphragm elevation, favored  to represent atelectasis or scarring.   EKG:  Atrial fibrillation with RVR rate 133.  ST depression from V2-V4, mild ST elevation lead l, V6-->posterolateral  ASSESSMENT AND PLAN:   1.Chest pain/unstable Angina- EKG concerning for posterolateral MI with positive troponin 0.47.   -Admit to [id number]-[id number] -Catheterization performed-please see cath note per Dr. Swaz[place] -Continue ASA, Effient -Check FLP, 2D echocardiogram to evaluate structural heart disease -Start BB, statin -UDS -Check TSH -Counsel of smoking cessation  2. Atrial fibrillation with RVR- likely 2/2 MI. CHAD2 score: zero. -Start beta blocker -will check TSH -if afib persists, will start anti-coagulation later on  today  3. Leukocytosis- likely 2/2 stress de-margination.  Patient is afebrile, no focal source of infection as CXR does not show any infiltrates. -Repeat 2 views chest Xray once he is stable -Monitor temp and CBC -Will check urinalysis/ctx -Consider starting abx once there is a source of infection identified  4. Hypokalemia- slightly low K of 3.3.   -Replete with KCl  -Will check Mg and monitor BMP  5. Asthma- stable, will start albuterol inhaler  6. Tobacco abuse: smoking cessation counseling.  -Nicotine patch  Case management to help with medication since patient is unemployed and will need to be on either Plavix or Effient as outpatient for his stent.  VTE ppx: SCDs  Signed: HO,MICHELE 11/21/2011, 8:30 AM  Patient seen and examined and history reviewed. Agree with above findings and plan. Young white male presented to [facility] with acute chest pain. Ecg consistent with acute posterolateral STEMI with ST elevation in  leads I and V6 and marked ST depression in leads V2-4. He had a witnessed Vfib arrest and was emergently cardioverted. Now in atrial fibrillation. On arrival he is alert. Ongoing chest pain. Exam is remarkable for poor dentition. He is obese. Lung and heart sounds are normal. Patient taken for emergent cath/ PCI. Hopefully with reperfusion atrial fib will convert. Will Rx with dual antiplatelet Rx, statin, beta blockers.   Thedora Hinders, [facility] 11/21/2011 12:25 PM

## 2011-11-21 NOTE — Op Note (Signed)
Cardiac Catheterization Procedure Note  Name: Nathan Waters MRN: [id number] DOB: 05-07-1981  Procedure: Left Heart Cath, Selective Coronary Angiography, LV angiography, PTCA and stenting of the left circumflex  Indication: 31 year old white male who presents to an outside hospital with acute onset of chest pain. ECG shows evidence of a posterior lateral myocardial infarction with ST elevation in leads 1 and V6 and significant ST depression in leads of V2 through V4. Patient had a witnessed arrest with ventricular fibrillation.  Procedural Details:  The right wrist was prepped, draped, and anesthetized with 1% lidocaine. Using the modified Seldinger technique, a 6 French sheath was introduced into the right radial artery. 3 mg of verapamil was administered through the sheath, weight-based unfractionated heparin was administered intravenously. Standard Judkins catheters were used for selective coronary angiography and left ventriculography. Catheter exchanges were performed over an exchange length guidewire.  PROCEDURAL FINDINGS Hemodynamics: Patient was in atrial fibrillation during the procedure AO 106/77 with a mean of 91 mmHg LV 109/24   Coronary angiography: Coronary dominance: right  Left mainstem: Normal  Left anterior descending (LAD): Normal.  Left circumflex (LCx): This is a moderately large vessel which gives rise to 2 marginal branches. There is a 99% stenosis just prior to the bifurcation of the first obtuse marginal vessel.  Right coronary artery (RCA): Dominant vessel. It is normal.  Left ventriculography: Left ventricular systolic function is normal, LVEF is estimated at 55%, there is no significant mitral regurgitation   PCI Note:  Following the diagnostic procedure, the decision was made to proceed with PCI.  Weight-based bivalirudin was given for anticoagulation. Once a therapeutic ACT was achieved, a 6 [place] XB LAD 3.5 guide catheter was inserted.  A pro-water  coronary guidewire was used to cross the lesion.  The lesion was predilated with a 2.5 x 12 mm balloon.  The lesion was then stented with a 3.0 x 16 Veriflex stent.  The stent was postdilated with a 3.25 mm noncompliant balloon.  Following PCI, there was 0% residual stenosis and TIMI-3 flow. Final angiography confirmed an excellent result. The patient tolerated the procedure well. There were no immediate procedural complications. A TR band was used for radial hemostasis. The patient was transferred to the post catheterization recovery area for further monitoring.  PCI Data: Vessel - left circumflex/Segment - mid vessel Percent Stenosis (pre)  99% TIMI-flow 1 Stent 3.0 x 16 mm Veriflex Percent Stenosis (post) 0% TIMI-flow (post) 3  Final Conclusions:   1. Single vessel occlusive atherosclerotic coronary disease. 2. Good overall left ventricular systolic function. 3. Successful intracoronary stenting of the mid circumflex with a bare-metal stent.   Recommendations:  Recommend dual antiplatelet therapy for one year.  Peter Swaziland 11/21/2011, 9:23 AM

## 2011-11-21 NOTE — ED Notes (Signed)
Gave report to Riki Rusk at [facility] cone cath lab

## 2011-11-21 NOTE — ED Notes (Signed)
Per EMS - pt from home, pt w/ onset of chest pain approx 45 minutes ago - pt states pain is worse w/ movement and inspiration. Pt denies any n/v. Pt given ASA 324 and x2 nitro SL by EMS en route w/o relief.

## 2011-11-21 NOTE — ED Notes (Signed)
Report given by Cala Bradford, RN.

## 2011-11-21 NOTE — ED Provider Notes (Signed)
History     CSN: [id number]  Arrival date & time 11/21/11  0605   First MD Initiated Contact with Patient 11/21/11 [phone]      Chief Complaint  Patient presents with  . Chest Pain    (Consider location/radiation/quality/duration/timing/severity/associated sxs/prior treatment) Patient is a 31 y.o. male presenting with chest pain. The history is provided by the patient and the EMS personnel.  Chest Pain The chest pain began 1 - 2 hours ago. Chest pain occurs constantly. The chest pain is unchanged. Associated with: vomiting. At its most intense, the pain is at 10/10. The pain is currently at 10/10. The severity of the pain is moderate. The quality of the pain is described as sharp. The pain radiates to the left arm and right arm. Exacerbated by: nothing. Primary symptoms include nausea and vomiting. Pertinent negatives for primary symptoms include no fever, no syncope, no shortness of breath, no cough, no wheezing, no abdominal pain and no dizziness. He tried nitroglycerin and aspirin for the symptoms.   acute onset of substernal chest pain that radiates to both arms immediately following a vomiting episode. Did not vomit blood hasn't vomited since EMS gave patient aspirin and nitroglycerin with no change in his pain. No history of similar pain. Pain is not exacerbated by anything.  Past Medical History  Diagnosis Date  . Migraine     Past Surgical History  Procedure Date  . Splenectomy   . Back surgery     No family history on file.  History  Substance Use Topics  . Smoking status: Current Everyday Smoker -- 2.0 packs/day    Types: Cigarettes  . Smokeless tobacco: Not on file  . Alcohol Use: Yes     occasionally      Review of Systems  Constitutional: Negative for fever.  HENT: Negative for congestion, neck pain and neck stiffness.   Eyes: Negative for redness and visual disturbance.  Respiratory: Negative for cough, shortness of breath and wheezing.   Cardiovascular:  Positive for chest pain. Negative for syncope.  Gastrointestinal: Positive for nausea and vomiting. Negative for abdominal pain.  Genitourinary: Negative for dysuria.  Musculoskeletal: Negative for back pain.  Skin: Negative for rash.  Neurological: Negative for dizziness and headaches.  Hematological: Does not bruise/bleed easily.    Allergies  Penicillins  Home Medications   Current Outpatient Rx  Name Route Sig Dispense Refill  . BUTALBITAL-APAP-CAFF-COD 50-325-40-30 MG PO CAPS Oral Take 2 capsules by mouth every 4 (four) hours as needed. For headache pain     . BUTALBITAL-APAP-CAFFEINE 50-325-40 MG PO TABS Oral Take 2 tablets by mouth every 4 (four) hours as needed.      Marland Kitchen HYDROCODONE-ACETAMINOPHEN 5-325 MG PO TABS  1 or 2 po q4h prn pain 20 tablet 0  . METHOCARBAMOL 500 MG PO TABS Oral Take 500 mg by mouth 3 (three) times daily as needed.        BP 149/91  Pulse 132  Temp(Src) 97.7 F (36.5 C) (Oral)  Resp 17  SpO2 100%  Physical Exam  Nursing note and vitals reviewed. Constitutional: He is oriented to person, place, and time. He appears well-developed and well-nourished.  HENT:  Head: Normocephalic and atraumatic.  Mouth/Throat: Oropharynx is clear and moist.  Eyes: Conjunctivae and EOM are normal. Pupils are equal, round, and reactive to light.  Neck: Normal range of motion. Neck supple.       No crepitus  Cardiovascular: Normal rate, regular rhythm, normal heart sounds and intact distal  pulses.   No murmur heard. Pulmonary/Chest: Effort normal and breath sounds normal. No respiratory distress. He has no wheezes. He has no rales. He exhibits no tenderness.  Abdominal: Soft. Bowel sounds are normal. There is no tenderness.  Musculoskeletal: Normal range of motion. He exhibits no edema.  Neurological: He is alert and oriented to person, place, and time. No cranial nerve deficit. He exhibits normal muscle tone. Coordination normal.  Skin: Skin is warm. No rash noted.      ED Course  Procedures (including critical care time)  Labs Reviewed  CBC - Abnormal; Notable for the following:    WBC 13.1 (*)    All other components within normal limits  BASIC METABOLIC PANEL - Abnormal; Notable for the following:    Potassium 3.3 (*)    Glucose, Bld 154 (*)    All other components within normal limits  D-DIMER, QUANTITATIVE  TROPONIN I  URINALYSIS, WITH MICROSCOPIC  URINE RAPID DRUG SCREEN (HOSP PERFORMED)   Dg Chest Portable 1 View  11/21/2011  *RADIOLOGY REPORT*  Clinical Data: Pleuritic chest pain  PORTABLE CHEST - 1 VIEW  Comparison: 02/07/2011  Findings: Mild left lung base opacity with associated mild elevation of the left hemidiaphragm. Cardiomediastinal contours within normal limits.  No pleural effusion or pneumothorax.  No acute osseous abnormality.  IMPRESSION: Mild left lower lobe opacity and hemidiaphragm elevation, favored to represent atelectasis or scarring.  Original Report Authenticated By: Waneta Martins, M.D.    Date: 11/21/2011  Rate: 75  Rhythm: normal sinus rhythm and sinus arrhythmia  QRS Axis: normal  Intervals: normal  ST/T Wave abnormalities: nonspecific ST/T changes  Conduction Disutrbances:none  Narrative Interpretation:   Old EKG Reviewed: none available Initial EKG with questionable posterior ischemia.   Date: 11/21/2011  Rate: 131  Rhythm: sinus tachycardia  QRS Axis: normal  Intervals: normal  ST/T Wave abnormalities: acute myocardial infarction  Conduction Disutrbances:none  Narrative Interpretation:   Old EKG Reviewed: changes noted This EKG is post cardiac arrest obvious posterior MI with marked ST segment depression leads V1 V2 and V3 code STEMI called.    Results for orders placed during the hospital encounter of 11/21/11  CBC      Component Value Range   WBC 13.1 (*) 4.0 - 10.5 (K/uL)   RBC 4.77  4.22 - 5.81 (MIL/uL)   Hemoglobin 15.1  13.0 - 17.0 (g/dL)   HCT 29.5  28.4 - 13.2 (%)   MCV 92.9  78.0  - 100.0 (fL)   MCH 31.7  26.0 - 34.0 (pg)   MCHC 34.1  30.0 - 36.0 (g/dL)   RDW 44.0  10.2 - 72.5 (%)   Platelets 274  150 - 400 (K/uL)  BASIC METABOLIC PANEL      Component Value Range   Sodium 138  135 - 145 (mEq/L)   Potassium 3.3 (*) 3.5 - 5.1 (mEq/L)   Chloride 106  96 - 112 (mEq/L)   CO2 24  19 - 32 (mEq/L)   Glucose, Bld 154 (*) 70 - 99 (mg/dL)   BUN 9  6 - 23 (mg/dL)   Creatinine, Ser 3.66  0.50 - 1.35 (mg/dL)   Calcium 9.2  8.4 - 44.0 (mg/dL)   GFR calc non Af Amer >90  >90 (mL/min)   GFR calc Af Amer >90  >90 (mL/min)  D-DIMER, QUANTITATIVE      Component Value Range   D-Dimer, Quant <0.22  0.00 - 0.48 (ug/mL-FEU)     1. Chest pain  CRITICAL CARE Performed by: Shelda Jakes.   Total critical care time: 30  Critical care time was exclusive of separately billable procedures and treating other patients.  Critical care was necessary to treat or prevent imminent or life-threatening deterioration.  Critical care was time spent personally by me on the following activities: development of treatment plan with patient and/or surrogate as well as nursing, discussions with consultants, evaluation of patient's response to treatment, examination of patient, obtaining history from patient or surrogate, ordering and performing treatments and interventions, ordering and review of laboratory studies, ordering and review of radiographic studies, pulse oximetry and re-evaluation of patient's condition.   MDM   Patient's chest x-ray and labs are still pending. EKG does raise some concern for ST segment changes although at his age would not expect acute cardiac event. Since chest pain started immediately after her episode of vomiting could be esophageal irritation from stomach acid or perhaps even esophageal perforation. Clinically no evidence of subcutaneous air in the neck or upper part of chest. Will treat with pain medicines in the ED. D-dimer pending cardiac marker pending  urinalysis with drug screen pending. Would consider having a low threshold to do CT angiography of chest.  Patient will be turned over to Dr. Ignacia Palma the morning ED physician to check the labs and x-ray results.   Addendum:  She with acute change in the emergency department heart rate on the monitor went into a bradycardia cardiac rhythm and then patient went into V. Fib and went into cardiac arrest. CPR initiated. Patient placed with defibrillator pads and shocked for V. Fib 120 J biphasic patient returned to sinus rhythm woke up [employer] Arona bolus 150 mg initiated. Repeat EKG clearly consistent with posterior MI with marked ST segment depression now and some ST segment elevation in V5 ST segment depression was in V1 V2 V3. Upper and initiated code STEMI called to discuss with Dr. [place] at [facility] patient will be transferred to the Cath Lab to rocking him EMS. Initially after cardiac arrest patient's chest pains was resolved but now is recurring so we'll start IV nitroglycerin. Owing arrest blood pressure returned to systolic of 126 is to rest EKG at heart rate of 131. Patient was not given any of epinephrine or atropine.         Shelda Jakes, MD 11/21/11 [phone]

## 2011-11-22 ENCOUNTER — Other Ambulatory Visit: Payer: Self-pay

## 2011-11-22 ENCOUNTER — Inpatient Hospital Stay (HOSPITAL_COMMUNITY): Payer: Medicaid Other

## 2011-11-22 DIAGNOSIS — I2129 ST elevation (STEMI) myocardial infarction involving other sites: Secondary | ICD-10-CM

## 2011-11-22 DIAGNOSIS — I517 Cardiomegaly: Secondary | ICD-10-CM

## 2011-11-22 LAB — COMPREHENSIVE METABOLIC PANEL
CO2: 22 mEq/L (ref 19–32)
Calcium: 9.2 mg/dL (ref 8.4–10.5)
Creatinine, Ser: 0.8 mg/dL (ref 0.50–1.35)
GFR calc Af Amer: >90 mL/min (ref >90)
GFR calc non Af Amer: >90 mL/min (ref >90)
Glucose, Bld: 112 mg/dL — ABNORMAL HIGH (ref 70–99)

## 2011-11-22 LAB — URINE CULTURE

## 2011-11-22 LAB — CBC
Hemoglobin: 15.4 g/dL (ref 13.0–17.0)
MCH: 32.4 pg (ref 26.0–34.0)
MCV: 93.3 fL (ref 78.0–100.0)
Platelets: 272 10*3/uL (ref 150–400)
RBC: 4.76 MIL/uL (ref 4.22–5.81)

## 2011-11-22 MED FILL — Medication: Qty: 1 | Status: AC

## 2011-11-22 NOTE — Progress Notes (Signed)
CARDIAC REHAB PHASE I   PRE:  Rate/Rhythm: 94 SR    BP: sitting 119/65    SaO2: 95 RA  MODE:  Ambulation: 390 ft   POST:  Rate/Rhythm: 100 ST    BP: sitting 122/74     SaO2: 91 RA  Tolerated well. No c/o. Ed completed and pt voiced understanding. Sts he will quit smoking. Requests his name be sent to [facility] [phone]  [name] CES, ACSM

## 2011-11-22 NOTE — Progress Notes (Signed)
UR Completed. Simmons, [place] F [phone]  

## 2011-11-22 NOTE — H&P (Signed)
CARDIOLOGY ADMISSION NOTE  Patient ID: Nathan Waters MRN: [id number] DOB/AGE: 13-Feb-1981 30 y.o.  Admit date: 11/21/2011 Primary Physician : none Primary Cardiologist : none Chief Complaint   : chest pain  HPI: Nathan Waters is a 31 yo M with PMH significant for asthma and back surgery presents initially to [facility] for chest pain that started at 2:30AM on day of admission.  Has a significant family history of early CAD. Had posterior lateral MI- bare-metal stenting of left circumflex.  Had one vomiting episode last night, otherwise no chest pain. Had good sleep.    Past Medical History  Diagnosis Date  . Migraine Asthma     Past Surgical History  Procedure Date  . Splenectomy-for unclear etiology of splenomegaly -  . Back surgery     Allergies  Allergen Reactions  . Penicillins Anaphylaxis   Current Facility-Administered Medications on File Prior to Encounter  Medication Dose Route Frequency Provider Last Rate Last Dose  . DISCONTD: 0.9 %  sodium chloride infusion   Intravenous Continuous Shelda Jakes, MD      . DISCONTD: 0.9 %  sodium chloride infusion   Intravenous Continuous Peter Swaziland, MD      . DISCONTD: acetaminophen (TYLENOL) tablet 650 mg  650 mg Oral Q4H PRN Peter Swaziland, MD      . DISCONTD: amiodarone (CORDARONE) 150 mg in dextrose 5 % 100 mL bolus  150 mg Intravenous PRN Shelda Jakes, MD   150 mg at 11/21/11 0714  . DISCONTD: aspirin chewable tablet 81 mg  81 mg Oral Daily Peter Swaziland, MD      . DISCONTD: butalbital-acetaminophen-caffeine (FIORICET WITH CODEINE) per capsule 2 capsule  2 capsule Oral Q4H PRN Peter Swaziland, MD      . DISCONTD: heparin 100 UNIT/ML infusion           . DISCONTD: heparin ADULT infusion 100 units/ml (25000 units/250 ml)  1,000 Units/hr Intravenous Once Shelda Jakes, MD      . DISCONTD: methocarbamol (ROBAXIN) tablet 500 mg  500 mg Oral TID PRN Peter Swaziland, MD      . DISCONTD: nitroGLYCERIN 0.2 mg/mL in  dextrose 5 % infusion  5-200 mcg/min Intravenous Once Shelda Jakes, MD      . DISCONTD: ondansetron [facility]) injection 4 mg  4 mg Intravenous Q6H PRN Peter Swaziland, MD      . DISCONTD: pantoprazole (PROTONIX) injection 40 mg  40 mg Intravenous Once Shelda Jakes, MD      . DISCONTD: prasugrel (EFFIENT) tablet 10 mg  10 mg Oral Daily Peter Swaziland, MD      . DISCONTD: rosuvastatin (CRESTOR) tablet 40 mg  40 mg Oral q1800 Peter Swaziland, MD       No current outpatient prescriptions on file prior to encounter.    Physical Exam: General: NAD, resting in bed.  Head: normocephalic and atraumatic.  Eyes: vision grossly intact, pupils equal, pupils round, pupils reactive to light, no injection and anicteric.  Mouth: pharynx pink and moist, no erythema, and no exudates.  Neck: supple, full ROM, no thyromegaly, no JVD, and no carotid bruits.  Lungs: normal respiratory effort, no accessory muscle use, coarse breath sounds, no crackles or wheezes.  Heart: irregularly irregular, no murmur, no gallop, and no rub.  Abdomen: soft, non-tender, normal bowel sounds Pulses: 2+ DP/PT pulses bilaterally Extremities: No cyanosis, clubbing, edema Neurologic: alert & oriented X3 Skin: turgor normal and no rashes.   Labs: Lab  Results  Component Value Date   BUN 5* 11/22/2011   Lab Results  Component Value Date   CREATININE 0.80 11/22/2011   Lab Results  Component Value Date   NA 138 11/22/2011   K 3.9 11/22/2011   CL 107 11/22/2011   CO2 22 11/22/2011   Lab Results  Component Value Date   TROPONINI 0.47* 11/21/2011   Lab Results  Component Value Date   WBC 16.0* 11/22/2011   HGB 15.4 11/22/2011   HCT 44.4 11/22/2011   MCV 93.3 11/22/2011   PLT 272 11/22/2011   Radiology:  CXR on 11/21/11: Mild left lower lobe opacity and hemidiaphragm elevation, favored  to represent atelectasis or scarring.   EKG:  Atrial fibrillation with RVR rate 133.  ST depression from V2-V4, mild ST elevation lead l,  V6-->posterolateral  ASSESSMENT AND PLAN:   1.STEMI- acute posterolateral STEMI- with troponin 0.47. Single vessel disease- left circumflex- status post PCI with bare-metal stenting on 2/4.  -UDS- negative, total cholesterol 141 LDL 84, HDL 32, HbA1c 5.7, TSH 1.315  - Continue ASA, Effient  - 2D echocardiogram to evaluate structural heart disease- pending  - BB and statin started. - Counsel for smoking cessation  2. Atrial fibrillation with RVR- likely 2/2 MI. CHAD2 score: zero. -Start beta blocker - TSH 1.315 -if afib persists, will consider anti-coagulation later on today.  3. Leukocytosis- likely 2/2 stress de-margination.  Patient is afebrile, no focal source of infection as CXR does not show any infiltrates. - CXR this morning shows no signs of infection.  -Monitor temp and CBC - UA negative  - will not give antibiotics at this point of time.   4. Hypokalemia-  normalized with replacement - magnesium 2.1   5. Asthma- stable. - Continue albuterol inhaler   6. Tobacco abuse: smoking cessation counseling.  -Nicotine patch  Case management to help with medication since patient is unemployed and will need to be on either Plavix or Effient as outpatient for his stent.  VTE ppx: SCDs  Signed: PATEL,RAVI 11/22/2011, 7:38 AM   Patient examined and agree except changes made. Compliance emphasized as well as not smoking. Has Medicaid and [place] [facility] involved. Will followup in [place] with [facility].  Valera Castle, MD 11/22/2011 9:01 AM

## 2011-11-22 NOTE — Progress Notes (Signed)
  Echocardiogram 2D Echocardiogram has been performed.  Jorje Guild [facility] 11/22/2011, 9:52 AM

## 2011-11-23 ENCOUNTER — Inpatient Hospital Stay (HOSPITAL_COMMUNITY): Payer: Medicaid Other

## 2011-11-23 DIAGNOSIS — I2129 ST elevation (STEMI) myocardial infarction involving other sites: Principal | ICD-10-CM

## 2011-11-23 LAB — CBC
Platelets: 269 10*3/uL (ref 150–400)
RBC: 5.02 MIL/uL (ref 4.22–5.81)
WBC: 17.1 10*3/uL — ABNORMAL HIGH (ref 4.0–10.5)

## 2011-11-23 LAB — DIFFERENTIAL
Lymphocytes Relative: 26 % (ref 12–46)
Lymphs Abs: 4.4 10*3/uL — ABNORMAL HIGH (ref 0.7–4.0)
Neutrophils Relative %: 65 % (ref 43–77)

## 2011-11-23 MED ORDER — AZITHROMYCIN 250 MG PO TABS
250.0000 mg | ORAL_TABLET | Freq: Every day | ORAL | Status: DC
  Administered 2011-11-24: 250 mg via ORAL
  Filled 2011-11-23: qty 1

## 2011-11-23 MED ORDER — ACETAMINOPHEN 500 MG PO TABS
1000.0000 mg | ORAL_TABLET | Freq: Four times a day (QID) | ORAL | Status: DC | PRN
  Administered 2011-11-23: 1000 mg via ORAL
  Filled 2011-11-23: qty 2

## 2011-11-23 MED ORDER — AZITHROMYCIN 500 MG PO TABS
500.0000 mg | ORAL_TABLET | Freq: Every day | ORAL | Status: AC
  Administered 2011-11-23: 500 mg via ORAL
  Filled 2011-11-23: qty 1

## 2011-11-23 MED ORDER — HYDROCODONE-ACETAMINOPHEN 5-325 MG PO TABS: 0.5000 | ORAL_TABLET | ORAL | Status: DC | PRN

## 2011-11-23 MED ORDER — ALBUTEROL SULFATE (5 MG/ML) 0.5% IN NEBU
2.5000 mg | INHALATION_SOLUTION | RESPIRATORY_TRACT | Status: DC
  Filled 2011-11-23: qty 0.5

## 2011-11-23 MED ORDER — IPRATROPIUM BROMIDE 0.02 % IN SOLN
0.5000 mg | RESPIRATORY_TRACT | Status: DC
  Filled 2011-11-23: qty 2.5

## 2011-11-23 NOTE — Progress Notes (Signed)
Evaluated pt this afternoon. He continues to have low grade fevers with no source. Blood cultures are pending. Will transfer to telemetry today and if afebrile in am, he can be discharged home in am.   MCALHANY,CHRISTOPHER 1:06 PM 11/23/2011

## 2011-11-23 NOTE — Progress Notes (Signed)
Patient ambulated 600 ft independently.  Tolerated well.  VSS.  Will continue to monitor.

## 2011-11-23 NOTE — Progress Notes (Signed)
CARDIAC REHAB PHASE I   PRE:  Rate/Rhythm: 92 SR asleep, 128 ST getting to EOB    BP: sitting 102/51    SaO2: 92 RA  MODE:  Ambulation: 740 ft   POST:  Rate/Rhythm: 115 ST    BP: sitting 119/73     SaO2: 97 RA  Pt sts he feels good, just sleepy. Denied palpitations. Tolerated well. Reinforced smoking cessation.  [phone]  [name] CES, ACSM

## 2011-11-23 NOTE — Progress Notes (Addendum)
Subjective: HPI: Nathan Waters is a 31 yo M with PMH significant for asthma and back surgery presents initially to [facility] for chest pain with witnessed V-fib and cardiac arrest s/p cardioverted on 2/4.   Has a significant family history of early CAD.  Had posterior lateral MI-s/p bare-metal stenting of left circumflex on 2/4.  He denies any chest pain, SOB, N/Vor chills, abdominal pain this morning.  He does have occasional cough but could not tell me if it is productive or not. Asking if he is being discharged today. Had a Tmax of 100.9 but repeat was 98. He was also found to have 17 beat of non-sustained VT yesterday at noon.   Objective: Vital signs in last 24 hours: Filed Vitals:   11/23/11 0400 11/23/11 0455 11/23/11 0500 11/23/11 0600  BP: 116/65  119/58 120/63  Pulse: 79  80 85  Temp: 100.9 F (38.3 C)     TempSrc: Oral     Resp: 20  21 20   Height:      Weight:  260 lb 2.3 oz (118 kg)    SpO2: 92%  92% 90%   Weight change: -1 lb 5.2 oz (-0.6 kg)  Intake/Output Summary (Last 24 hours) at 11/23/11 0611 Last data filed at 11/23/11 0400  Gross per 24 hour  Intake   1160 ml  Output   1910 ml  Net   -750 ml   Physical Exam: General: NAD, resting in bed.  Neck: supple, full ROM, no thyromegaly, no JVD, and no carotid bruits.  Lungs: normal respiratory effort, no accessory muscle use, ++coarse breath sounds, no crackles, +++diffuse wheezes bilaterally, decrease/tight air movement on right.  Heart: RRR, no murmur, no gallop, and no rub.  Abdomen: soft, non-tender, normal bowel sounds  Pulses: 2+ DP/PT pulses bilaterally Extremities: No cyanosis, clubbing, edema Neurologic: nonfocal Skin: turgor normal and no rashes.  Lab Results: Basic Metabolic Panel:  Lab 11/22/11 1610 11/21/11 1643 11/21/11 1135  NA 138 140 --  K 3.9 3.9 --  CL 107 110 --  CO2 22 23 --  GLUCOSE 112* 110* --  BUN 5* 5* --  CREATININE 0.80 0.76 --  CALCIUM 9.2 8.8 --  MG -- -- 2.1  PHOS -- -- --    Liver Function Tests:  Lab 11/22/11 0510  AST 136*  ALT 52  ALKPHOS 93  BILITOT 0.6  PROT 6.7  ALBUMIN 3.4*   CBC:  Lab 11/22/11 0510 11/21/11 0630  WBC 16.0* 13.1*  NEUTROABS -- --  HGB 15.4 15.1  HCT 44.4 44.3  MCV 93.3 92.9  PLT 272 274   Cardiac Enzymes:  Lab 11/21/11 0630  CKTOTAL --  CKMB --  CKMBINDEX --  TROPONINI 0.47*  Hemoglobin A1C:  Lab 11/21/11 1135  HGBA1C 5.7*   Fasting Lipid Panel:  Lab 11/21/11 1135  CHOL 141  HDL 32*  LDLCALC 84  TRIG 960  CHOLHDL 4.4  LDLDIRECT --   Thyroid Function Tests:  Lab 11/21/11 1135  TSH 1.315  T4TOTAL --  FREET4 --  T3FREE --  THYROIDAB --   Urine Drug Screen: Drugs of Abuse     Component Value Date/Time   LABOPIA POSITIVE* 11/21/2011 0855   COCAINSCRNUR NONE DETECTED 11/21/2011 0855   LABBENZ NONE DETECTED 11/21/2011 0855   AMPHETMU NONE DETECTED 11/21/2011 0855   THCU NONE DETECTED 11/21/2011 0855   LABBARB NONE DETECTED 11/21/2011 0855    Alcohol Level: No results found for this basename: ETH:2 in the last 168  hours Urinalysis:  Lab 11/21/11 1125  COLORURINE YELLOW  LABSPEC 1.023  PHURINE 7.5  GLUCOSEU NEGATIVE  HGBUR NEGATIVE  BILIRUBINUR NEGATIVE  KETONESUR NEGATIVE  PROTEINUR NEGATIVE  UROBILINOGEN 1.0  NITRITE NEGATIVE  LEUKOCYTESUR NEGATIVE    Micro Results: Recent Results (from the past 240 hour(s))  URINE CULTURE     Status: Normal   Collection Time   11/21/11 11:25 AM      Component Value Range Status Comment   Specimen Description URINE, CLEAN CATCH   Final    Special Requests NONE   Final    Culture  Setup Time [id number]   Final    Colony Count 20,OOO COLONIES/ML   Final    Culture     Final    Value: Multiple bacterial morphotypes present, none predominant. Suggest appropriate recollection if clinically indicated.   Report Status 11/22/2011 FINAL   Final   MRSA PCR SCREENING     Status: Normal   Collection Time   11/21/11 11:25 AM      Component Value Range Status  Comment   MRSA by PCR NEGATIVE  NEGATIVE  Final    Studies/Results: Dg Chest 2 View  11/22/2011  *RADIOLOGY REPORT*  Clinical Data: Myocardial infarction.  CHEST - 2 VIEW  Comparison: 11/21/2011  Findings: Aeration improved with no further atelectasis present at the left lung base.  No edema, infiltrate, nodule or pleural fluid identified.  The heart size is within normal limits.  IMPRESSION: No active disease.  Improved aeration.  Original Report Authenticated By: Reola Calkins, M.D.   Dg Chest Portable 1 View  11/21/2011  *RADIOLOGY REPORT*  Clinical Data: Pleuritic chest pain  PORTABLE CHEST - 1 VIEW  Comparison: 02/07/2011  Findings: Mild left lung base opacity with associated mild elevation of the left hemidiaphragm. Cardiomediastinal contours within normal limits.  No pleural effusion or pneumothorax.  No acute osseous abnormality.  IMPRESSION: Mild left lower lobe opacity and hemidiaphragm elevation, favored to represent atelectasis or scarring.  Original Report Authenticated By: Waneta Martins, M.D.   Medications:  Scheduled Meds:   . aspirin EC  81 mg Oral Daily  . influenza  inactive virus vaccine  0.5 mL Intramuscular Tomorrow-1000  . metoprolol tartrate  50 mg Oral BID  . nicotine  21 mg Transdermal Daily  . pneumococcal 23 valent vaccine  0.5 mL Intramuscular Tomorrow-1000  . potassium chloride  40 mEq Oral Once  . prasugrel  10 mg Oral Daily  . rosuvastatin  40 mg Oral q1800   Continuous Infusions:  PRN Meds:.acetaminophen, albuterol, butalbital-acetaminophen-caffeine, methocarbamol, ondansetron (ZOFRAN) IV, ondansetron, traZODone Assessment/Plan: 1.STEMI- acute posterolateral STEMI- with troponin 0.47.  Single vessel disease- left circumflex- status post PCI with bare-metal stenting on 2/4.  -UDS- negative, total cholesterol 141 LDL 84, HDL 32, HbA1c 5.7, TSH 1.315  - Continue ASA, Effient  - 2D echocardiogram : 55-60% EF, hypokinesis of basal-mid posterolateral  myocardium. Atrial septal aneurysm. - Continue BB and statin    2. Atrial fibrillation with RVR- likely 2/2 MI. CHAD2 score: zero. Now resolved, rate is well-controlled and is in sinus. -continue Metoprolol 50mg  bid - TSH 1.315 -wnl  3. Leukocytosis- likely 2/2 stress de-margination. Patient febrile at 4AM this morning with T max of 100.9, although there is no focal source of infection as CXR does not show any infiltrates on 2/5, UA was negative on admission.   -start Abx for empiric treatment? -get blood culture, sputum ctx -Repeat CXR  4. Hypokalemia- resolved - magnesium  2.1   5. Asthma- wheezing and coarse breath sounds on lung exam, patient has not been asking for his albuterol inhaler.  - Continue albuterol inhaler -Start Duo-Neb treatments  -If no improvement, may need steroids  6. Tobacco abuse: smoking cessation counseling.  -Nicotine patch  -Will write for Rx for Effient so he can get 30 days free, then after that will need prior authorization   VTE ppx: SCDs  Dispo: transfer to telemetry  Addendum: will add Zpak for severe bronchitis on 2 views CXR today.  LOS: 2 days   Nathan Waters 11/23/2011, 6:11 AM  I have personally seen and examined this patient with Dr Anselm Jungling. I agree with the assessment and plan as outlined above. We will transfer to telemetry this am. He is asymptomatic on good medical therapy. He did have a low grade fever this am but no source for infection. If afebrile all day, will d/c home later today. Follow up in [facility] cardiology office.   MCALHANY,CHRISTOPHER 9:18 AM 11/23/2011

## 2011-11-24 ENCOUNTER — Encounter (HOSPITAL_COMMUNITY): Payer: Self-pay | Admitting: Physician Assistant

## 2011-11-24 LAB — BASIC METABOLIC PANEL
Chloride: 103 mEq/L (ref 96–112)
GFR calc Af Amer: >90 mL/min (ref >90)
GFR calc non Af Amer: >90 mL/min (ref >90)
Potassium: 3.9 mEq/L (ref 3.5–5.1)
Sodium: 136 mEq/L (ref 135–145)

## 2011-11-24 LAB — CBC
HCT: 47 % (ref 39.0–52.0)
Hemoglobin: 16.1 g/dL (ref 13.0–17.0)
RBC: 5.03 MIL/uL (ref 4.22–5.81)

## 2011-11-24 MED ORDER — ROSUVASTATIN CALCIUM 40 MG PO TABS: 40.0000 mg | ORAL_TABLET | Freq: Every day | ORAL | Status: DC

## 2011-11-24 MED ORDER — ALBUTEROL SULFATE HFA 108 (90 BASE) MCG/ACT IN AERS: 2.0000 | INHALATION_SPRAY | RESPIRATORY_TRACT | Status: DC | PRN

## 2011-11-24 MED ORDER — ASPIRIN 81 MG PO TBEC: 81.0000 mg | DELAYED_RELEASE_TABLET | Freq: Every day | ORAL | Status: DC

## 2011-11-24 MED ORDER — AZITHROMYCIN 250 MG PO TABS: ORAL_TABLET | ORAL | Status: AC

## 2011-11-24 MED ORDER — NITROGLYCERIN 0.4 MG SL SUBL: 0.4000 mg | SUBLINGUAL_TABLET | SUBLINGUAL | Status: DC | PRN

## 2011-11-24 MED ORDER — METOPROLOL TARTRATE 50 MG PO TABS: 50.0000 mg | ORAL_TABLET | Freq: Two times a day (BID) | ORAL | Status: DC

## 2011-11-24 MED FILL — Dextrose Inj 5%: INTRAVENOUS | Qty: 50 | Status: AC

## 2011-11-24 NOTE — Consult Note (Signed)
Pt a heavy smoker of 2+ ppd. Discussed risk factors in detail with the pt. He is in action stage. Recommended starting with 42 mg patch (two 21 mg patches). Discussed and wrote down patch use instructions and tapering. Referred to 1-800 quit now for f/u and support. Discussed oral fixation substitutes, second hand smoke and in home smoking policy. Reviewed and gave pt Written education/contact information.

## 2011-11-24 NOTE — Discharge Summary (Signed)
Patient seen and examined and history reviewed. Agree with above findings and plan. See rounding note earlier today.   Nathan Waters 11/24/2011 12:37 PM

## 2011-11-24 NOTE — Progress Notes (Signed)
Subjective: HPI: Nathan Waters is a 31 yo M with PMH significant for asthma and back surgery presents initially to [facility] for chest pain with witnessed V-fib and cardiac arrest s/p cardioverted on 2/4.   Has a significant family history of early CAD. Had posterior lateral MI-s/p bare-metal stenting of left circumflex on 2/4.  He denies any chest pain, SOB, N/Vor chills, abdominal pain this morning.  He does have occasional cough. HR increases significantly with exertion to 120-130 sinus tachy. Little ambulation to date. Metoprolol held last night due to decreased BP 98 systolic.  Objective: Vital signs in last 24 hours: Filed Vitals:   11/23/11 1512 11/23/11 2023 11/24/11 0515 11/24/11 0606  BP: 122/75 98/50 102/59   Pulse: 98 79 84   Temp: 99.1 F (37.3 C) 98.2 F (36.8 C) 97.1 F (36.2 C)   TempSrc: Oral Oral Oral   Resp: 19 20 18    Height:      Weight:    252 lb 3.2 oz (114.397 kg)  SpO2: 95% 96% 95%    Weight change: -7 lb 15.1 oz (-3.603 kg)  Intake/Output Summary (Last 24 hours) at 11/24/11 0753 Last data filed at 11/23/11 1900  Gross per 24 hour  Intake    960 ml  Output    725 ml  Net    235 ml   Physical Exam: General: NAD, resting in bed.  Neck: supple, full ROM, no thyromegaly, no JVD, and no carotid bruits.  Lungs: normal respiratory effort, no accessory muscle use, ++coarse breath sounds, no crackles, no wheezes Heart: RRR, no murmur, no gallop, and no rub.  Abdomen: soft, non-tender, normal bowel sounds  Pulses: 2+ DP/PT pulses bilaterally Extremities: No cyanosis, clubbing, edema Neurologic: nonfocal Skin: turgor normal and no rashes.  Lab Results: Basic Metabolic Panel:  Lab 11/24/11 7829 11/22/11 0510 11/21/11 1135  NA 136 138 --  K 3.9 3.9 --  CL 103 107 --  CO2 23 22 --  GLUCOSE 111* 112* --  BUN 10 5* --  CREATININE 0.97 0.80 --  CALCIUM 9.7 9.2 --  MG -- -- 2.1  PHOS -- -- --   Liver Function Tests:  Lab 11/22/11 0510  AST 136*  ALT  52  ALKPHOS 93  BILITOT 0.6  PROT 6.7  ALBUMIN 3.4*   CBC:  Lab 11/24/11 0535 11/23/11 0740  WBC 16.9* 17.1*  NEUTROABS -- 11.2*  HGB 16.1 16.4  HCT 47.0 46.7  MCV 93.4 93.0  PLT 270 269   Cardiac Enzymes:  Lab 11/21/11 0630  CKTOTAL --  CKMB --  CKMBINDEX --  TROPONINI 0.47*  Hemoglobin A1C:  Lab 11/21/11 1135  HGBA1C 5.7*   Fasting Lipid Panel:  Lab 11/21/11 1135  CHOL 141  HDL 32*  LDLCALC 84  TRIG 562  CHOLHDL 4.4  LDLDIRECT --   Thyroid Function Tests:  Lab 11/21/11 1135  TSH 1.315  T4TOTAL --  FREET4 --  T3FREE --  THYROIDAB --   Urine Drug Screen: Drugs of Abuse     Component Value Date/Time   LABOPIA POSITIVE* 11/21/2011 0855   COCAINSCRNUR NONE DETECTED 11/21/2011 0855   LABBENZ NONE DETECTED 11/21/2011 0855   AMPHETMU NONE DETECTED 11/21/2011 0855   THCU NONE DETECTED 11/21/2011 0855   LABBARB NONE DETECTED 11/21/2011 0855    Alcohol Level: No results found for this basename: ETH:2 in the last 168 hours Urinalysis:  Lab 11/21/11 1125  COLORURINE YELLOW  LABSPEC 1.023  PHURINE 7.5  GLUCOSEU NEGATIVE  HGBUR NEGATIVE  BILIRUBINUR NEGATIVE  KETONESUR NEGATIVE  PROTEINUR NEGATIVE  UROBILINOGEN 1.0  NITRITE NEGATIVE  LEUKOCYTESUR NEGATIVE    Micro Results: Recent Results (from the past 240 hour(s))  URINE CULTURE     Status: Normal   Collection Time   11/21/11 11:25 AM      Component Value Range Status Comment   Specimen Description URINE, CLEAN CATCH   Final    Special Requests NONE   Final    Culture  Setup Time [id number]   Final    Colony Count 20,OOO COLONIES/ML   Final    Culture     Final    Value: Multiple bacterial morphotypes present, none predominant. Suggest appropriate recollection if clinically indicated.   Report Status 11/22/2011 FINAL   Final   MRSA PCR SCREENING     Status: Normal   Collection Time   11/21/11 11:25 AM      Component Value Range Status Comment   MRSA by PCR NEGATIVE  NEGATIVE  Final     Studies/Results: Dg Chest 2 View  11/23/2011  *RADIOLOGY REPORT*  Clinical Data: Fever.  Cough.  Leukocytosis.  CHEST - 2 VIEW  Comparison: 11/22/2011 and 11/21/2011  Findings: The patient has developed a linear atelectasis at the right base medially and in the left perihilar region extending superiorly and inferiorly.  There is peribronchial thickening. Heart size and vascularity are normal.  No effusions.  No osseous abnormality.  IMPRESSION: Progressive slight linear atelectasis bilaterally.  Findings are consistent with severe bronchitis.  Original Report Authenticated By: Gwynn Burly, M.D.   Medications:  Scheduled Meds:    . aspirin EC  81 mg Oral Daily  . azithromycin  500 mg Oral Daily   Followed by  . azithromycin  250 mg Oral Daily  . metoprolol tartrate  50 mg Oral BID  . nicotine  21 mg Transdermal Daily  . prasugrel  10 mg Oral Daily  . rosuvastatin  40 mg Oral q1800  . DISCONTD: albuterol  2.5 mg Nebulization Q4H  . DISCONTD: ipratropium  0.5 mg Nebulization Q4H  . DISCONTD: potassium chloride  40 mEq Oral Once   Continuous Infusions:  PRN Meds:.acetaminophen, albuterol, butalbital-acetaminophen-caffeine, HYDROcodone-acetaminophen, methocarbamol, ondansetron (ZOFRAN) IV, ondansetron, traZODone, DISCONTD: acetaminophen Assessment/Plan: 1.STEMI- acute posterolateral STEMI- with troponin 0.47.  Single vessel disease- left circumflex- status post PCI with bare-metal stenting on 2/4.  -UDS- negative, total cholesterol 141 LDL 84, HDL 32, HbA1c 5.7, TSH 1.315  - Continue ASA, Effient  - 2D echocardiogram : 55-60% EF, hypokinesis of basal-mid posterolateral myocardium. Atrial septal aneurysm. - Continue BB and statin. Will need to continue metoprolol 50 mg bid    2. Atrial fibrillation with RVR- likely 2/2 MI. CHAD2 score: zero. Now resolved in sinus. -continue Metoprolol 50mg  bid - TSH 1.315 -wnl  3. Leukocytosis- likely 2/2 stress de-margination. Patient febrile at  4AM this morning with T max of 100.9, although there is no focal source of infection as CXR does not show any infiltrates on 2/5, UA was negative on admission.   CXR consistent with severe bronchitis. Now on zithromax. Afebrile.  4. Hypokalemia- resolved - magnesium 2.1   5. Asthma- wheezing and coarse breath sounds on lung exam, patient has not been asking for his albuterol inhaler.  - Continue albuterol inhaler   6. Tobacco abuse: smoking cessation counseling.  -Nicotine patch  -Will write for Rx for Effient so he can get 30 days free, then after that will need prior authorization  VTE ppx: SCDs  Dispo:  Patient now on telemetry. Given recent cardiac events and bronchitis I would recommend he stay another day since he is still deconditioned and meds are being adjusted. Patient insists that he is going home today and will check out AMA. Poor understanding of medical condition and risk. Reinforced need for smoking cessation. Will need close follow up with cardiology in our [place] office. Long term treatment will be a challenge given lack of insight.    LOS: 3 days   Peter [place]az[place] 11/24/2011, 7:53 AM

## 2011-11-24 NOTE — Discharge Summary (Signed)
Discharge Summary   Patient ID: Nathan Waters,  MRN: [id number], DOB/AGE: May 05, 1981 31 y.o.  Admit date: 11/21/2011 Discharge date: 11/24/2011  Discharge Diagnoses Principal Problem:  *Myocardial infarction of posterolateral wall Active Problems:  Asthma  Leukocytosis  Hypokalemia  Atrial fibrillation   Allergies Allergies  Allergen Reactions  . Penicillins Anaphylaxis    Procedures  Cardiac catheterization  Hemodynamics: Patient was in atrial fibrillation during the procedure  AO 106/77 with a mean of 91 mmHg  LV 109/24  Coronary angiography:  Coronary dominance: right  Left mainstem: Normal  Left anterior descending (LAD): Normal.  Left circumflex (LCx): This is a moderately large vessel which gives rise to 2 marginal branches. There is a 99% stenosis just prior to the bifurcation of the first obtuse marginal vessel.  Right coronary artery (RCA): Dominant vessel. It is normal.  Left ventriculography: Left ventricular systolic function is normal, LVEF is estimated at 55%, there is no significant mitral regurgitation  PCI Note: Following the diagnostic procedure, the decision was made to proceed with PCI. Weight-based bivalirudin was given for anticoagulation. Once a therapeutic ACT was achieved, a 6 [place] XB LAD 3.5 guide catheter was inserted. A pro-water coronary guidewire was used to cross the lesion. The lesion was predilated with a 2.5 x 12 mm balloon. The lesion was then stented with a 3.0 x 16 Veriflex stent. The stent was postdilated with a 3.25 mm noncompliant balloon. Following PCI, there was 0% residual stenosis and TIMI-3 flow. Final angiography confirmed an excellent result. The patient tolerated the procedure well. There were no immediate procedural complications. A TR band was used for radial hemostasis. The patient was transferred to the post catheterization recovery area for further monitoring.  PCI Data:  Vessel - left circumflex/Segment - mid vessel    Percent Stenosis (pre) 99%  TIMI-flow 1  Stent 3.0 x 16 mm Veriflex  Percent Stenosis (post) 0%  TIMI-flow (post) 3  Final Conclusions:  1. Single vessel occlusive atherosclerotic coronary disease.  2. Good overall left ventricular systolic function.  3. Successful intracoronary stenting of the mid circumflex with a bare-metal stent.  Recommendations:  Recommend dual antiplatelet therapy for one year.  2D Echocardiogram with contrast  Study Conclusions  - Left ventricle: The cavity size was mildly dilated. Wall thickness was normal. Systolic function was normal. The estimated ejection fraction was in the range of 55% to 60%. There is hypokinesis of the basal-mid posterolateral myocardium. - Left atrium: The atrium was mildly dilated. - Atrial septum: There was an atrial septal aneurysm. Transthoracic echocardiography. M-mode, complete 2D, spectral Doppler, and color Doppler. Height: Height: 175.3cm. Height: 69in. Weight: Weight: 117.9kg. Weight: 259.5lb. Body mass index: BMI: 38.4kg/m^2. Body surface area: BSA: 2.50m^2. Blood pressure: 117/71. Patient status: Inpatient. Location: ICU/CCU ------------------------------------------------------------ ------------------------------------------------------------ Left ventricle: The cavity size was mildly dilated. Wall thickness was normal. Systolic function was normal. The estimated ejection fraction was in the range of 55% to 60%. Regional wall motion abnormalities: There is hypokinesis of the basal-mid posterolateral myocardium. ------------------------------------------------------------ Aortic valve: Trileaflet; normal thickness leaflets. Mobility was not restricted. Doppler: Transvalvular velocity was within the normal range. There was no stenosis. No regurgitation. ------------------------------------------------------------ Aorta: Aortic root: The aortic root was normal in size.   ------------------------------------------------------------ Mitral valve: Structurally normal valve. Mobility was not restricted. Doppler: Transvalvular velocity was within the normal range. There was no evidence for stenosis. Trivial regurgitation. ------------------------------------------------------------ Left atrium: The atrium was mildly dilated.  ------------------------------------------------------------ Atrial septum: There was an atrial septal aneurysm.  ------------------------------------------------------------  Right ventricle: The cavity size was normal. Systolic function was normal. ------------------------------------------------------------ Pulmonic valve: Doppler: Transvalvular velocity was within the normal range. There was no evidence for stenosis. ------------------------------------------------------------ Tricuspid valve: Structurally normal valve. Doppler: Transvalvular velocity was within the normal range. Trivial regurgitation. ------------------------------------------------------------ Right atrium: The atrium was normal in size.  ------------------------------------------------------------ Pericardium: There was no pericardial effusion.  ------------------------------------------------------------ Systemic veins: Inferior vena cava: The vessel was normal in size.  History of Present Illness  Nathan Waters is a 31 yo Caucasian male with PMHx significant for asthma and back surgery who was transferred to [facility] from [facility] for chest pain after experiencing VF arrest s/p successful cardioversion. He stated that the chest pain began at 2:30 AM on 02/04, he then called EMS at 3:30 AM. He was witnessed to have VF arrest. Chest pain was described as sharp, mid-sternal, 8/10 without radiation associated with SOB. Aggravated by movement. No prior episodes. Patient also with strong family history of premature CAD.   Hospital Course   At [facility], he underwent emergent PCI  revealing the above details including 99% LCx stenosis prox to OM1 bifurcation, LVEF 55% and s/p successful BMS placement to mid LCx. He tolerated the procedure well without immediate post-op complications. He had an episode of emesis the evening after the procedure. Otherwise, he did not experience recurrent chest pain. He was noted to have a fib with RVR. TSH was WNL, he was started on BB. He developed a mild leukocytosis and fever believed to be secondary to inflammation post-MI, however CXR did reveal evidence of severe bronchitis. His breathing remained stable at baseline. He was subsequently started on a Z-Pak. He was given albuterol, given tobacco cessation counseling and a nicotine patch. Mild hypokalemia was noted, and successfully supplemented. He continued to improve, a fib with RVR resolved and he was transferred from stepdown to telemetry.   Overnight, he developed an episode of mild hypotension and Lopressor was held. Today, his symptoms have improved, but it was recommended that he stay an additional day as he is deconditioned and for possible medication adjustments. He insisted on leaving AMA today despite recommendations against this. He has a poor understanding of his medical condition and risk. He will be discharged on ASA/Effient/Lopressor/Zocor/NTG SL PRN. Additionally, he will finish Z-Pak and continue with albuterol inh. Of note, patient is unemployed, and required assistance with Effient. I have also switched statin from Crestor to high-dose Zocor for affordability. This has been facilitated by CM. He will follow-up in [place] later this month. I have stressed the importance of medication and follow-up compliance. He expressed understanding.   Discharge Vitals:  Blood pressure 102/59, pulse 84, temperature 97.1 F (36.2 C), temperature source Oral, resp. rate 18, height 5\' 9"  (1.753 m), weight 114.397 kg (252 lb 3.2 oz), SpO2 95.00%.   Weight change: -3.603 kg (-7 lb 15.1  oz)  Labs: Recent Labs  [facility] 11/24/11 0535 11/23/11 0740   WBC 16.9* 17.1*   HGB 16.1 16.4   HCT 47.0 46.7   MCV 93.4 93.0   PLT 270 269    Lab 11/24/11 0535 11/22/11 0510 11/21/11 1643  NA 136 138 140  K 3.9 3.9 3.9  CL 103 107 110  CO2 23 22 23   BUN 10 5* 5*  CREATININE 0.97 0.80 0.76  CALCIUM 9.7 9.2 8.8  PROT -- 6.7 --  BILITOT -- 0.6 --  ALKPHOS -- 93 --  ALT -- 52 --  AST -- 136* --  AMYLASE -- -- --  LIPASE -- -- --  GLUCOSE 111* 112* 110*   Recent Labs  Basename 11/21/11 1135   HGBA1C 5.7*    Recent Labs  Basename 11/21/11 1135   CHOL 141   HDL 32*   LDLCALC 84   TRIG 124   CHOLHDL 4.4   LDLDIRECT --    Basename 11/21/11 1135  TSH 1.315  T4TOTAL --  T3FREE --  THYROIDAB --    Disposition:  Discharge Orders    Future Appointments: Provider: Department: Dept Phone: Center:   12/08/2011 1:00 PM Joni Reining, NP [facility] [phone] [id number]     Follow-up Information    Follow up with Joni Reining, NP on 12/08/2011. (At 1:00 PM for follow-up after your heart attack. )    Contact information:   [address], [address] [place] [address] [phone]          Discharge Medications:  Medication List  As of 11/24/2011  9:42 AM   START taking these medications         albuterol 108 (90 BASE) MCG/ACT inhaler   Commonly known as: PROVENTIL HFA;VENTOLIN HFA   Inhale 2 puffs into the lungs every 4 (four) hours as needed for wheezing or shortness of breath.      aspirin 81 MG EC tablet   Take 1 tablet (81 mg total) by mouth daily.      azithromycin 250 MG tablet   Commonly known as: ZITHROMAX   Take 1 tablet daily.      metoprolol 50 MG tablet   Commonly known as: LOPRESSOR   Take 1 tablet (50 mg total) by mouth 2 (two) times daily.      nitroGLYCERIN 0.4 MG SL tablet   Commonly known as: NITROSTAT   Place 1 tablet (0.4 mg total) under the tongue every 5 (five) minutes  as needed for chest pain.      rosuvastatin 40 MG tablet   Commonly known as: CRESTOR   Take 1 tablet (40 mg total) by mouth daily at 6 PM.    *Note, this has been replaced with Zocor 80mg  PO daily for affordability. [facility] $4 list*       CONTINUE taking these medications         acetaminophen 500 MG tablet   Commonly known as: TYLENOL      HYDROcodone-acetaminophen 5-325 MG per tablet   Commonly known as: NORCO          Where to get your medications    These are the prescriptions that you need to pick up. We sent them to a specific pharmacy, so you will need to go there to get them.   [id number] - [place], [place] - [address]    [address] [place] [address]    Phone: [phone]        albuterol 108 (90 BASE) MCG/ACT inhaler   aspirin 81 MG EC tablet   azithromycin 250 MG tablet   metoprolol 50 MG tablet   nitroGLYCERIN 0.4 MG SL tablet   rosuvastatin 40 MG tablet           Outstanding Labs/Studies: None  Duration of Discharge Encounter: 40 minutes including physician time.  Signed, R. Hurman Horn, PA-C 11/24/2011, 9:42 AM

## 2011-11-26 ENCOUNTER — Telehealth: Payer: Self-pay | Admitting: Physician Assistant

## 2011-11-26 NOTE — Telephone Encounter (Signed)
Received call from patient.  Zocor and Effient not being filled by his pharmacy. Called his pharmacy. Needs prior auth. For Zocor 80 mg due to the dose and Medicaid will not pay for Effient. Will switch to Plavix 75 mg QD. D/c Zocor and start Lipitor 80 mg QD. Notified patient. Tereso Newcomer, PA-C  11:43 AM 11/26/2011

## 2011-11-28 ENCOUNTER — Encounter (HOSPITAL_COMMUNITY): Payer: Self-pay

## 2011-11-28 ENCOUNTER — Encounter (HOSPITAL_COMMUNITY)
Admission: RE | Admit: 2011-11-28 | Discharge: 2011-11-28 | Disposition: A | Discharge status: Home / Self Care | Payer: Medicaid Other | Source: Ambulatory Visit | Attending: Cardiology | Admitting: Cardiology

## 2011-11-28 DIAGNOSIS — I252 Old myocardial infarction: Secondary | ICD-10-CM | POA: Insufficient documentation

## 2011-11-28 DIAGNOSIS — I2510 Atherosclerotic heart disease of native coronary artery without angina pectoris: Secondary | ICD-10-CM | POA: Insufficient documentation

## 2011-11-28 DIAGNOSIS — Z5189 Encounter for other specified aftercare: Secondary | ICD-10-CM | POA: Insufficient documentation

## 2011-11-28 DIAGNOSIS — Z9861 Coronary angioplasty status: Secondary | ICD-10-CM | POA: Insufficient documentation

## 2011-11-28 NOTE — Progress Notes (Signed)
During orientation advised patient on arrival and appointment times what to wear, what to do before, during and after exercise. Reviewed attendance and class policy. Talked about inclement weather and class consultation policy. Pt is scheduled to start Cardiac Rehab on 12/07/11 at 11:00. Pt was advised to come to class 5 minutes before class starts. He was also given instructions on meeting with the dietician and attending the Family Structure classes. Pt is eager to get started.

## 2011-11-28 NOTE — Patient Instructions (Signed)
Pt has finished orientation and is scheduled to start CR on 12/07/11 at 11:00. Pt has been instructed to arrive to class 15 minutes early for scheduled class. Pt has been instructed to wear comfortable clothing and shoes with rubber soles. Pt has been told to take their medications 1 hour prior to coming to class.  If the patient is not going to attend class, he/she has been instructed to call.

## 2011-11-29 LAB — CULTURE, BLOOD (ROUTINE X 2)
Culture: NO GROWTH
Culture: NO GROWTH

## 2011-12-07 ENCOUNTER — Encounter (HOSPITAL_COMMUNITY): Payer: Medicaid Other

## 2011-12-07 ENCOUNTER — Encounter: Payer: Self-pay | Admitting: Adult Health

## 2011-12-08 ENCOUNTER — Encounter: Payer: Self-pay | Admitting: Adult Health

## 2011-12-08 ENCOUNTER — Ambulatory Visit (INDEPENDENT_AMBULATORY_CARE_PROVIDER_SITE_OTHER): Payer: Medicaid Other | Admitting: Adult Health

## 2011-12-08 DIAGNOSIS — I2129 ST elevation (STEMI) myocardial infarction involving other sites: Secondary | ICD-10-CM

## 2011-12-08 DIAGNOSIS — I4891 Unspecified atrial fibrillation: Secondary | ICD-10-CM

## 2011-12-08 NOTE — Assessment & Plan Note (Signed)
He is doing well without recurrence of symptoms. He will remain of Plavix for a minimum of 30 days. I will see him in 3 months and will take him off of this. He is encouraged to attend cardiac rehab and to continue smoking cessation. Follow-up lipids and [facility].

## 2011-12-08 NOTE — Progress Notes (Signed)
HPI: Nathan Waters is a 31 y/o patient of Dr. Juanito Doom, who is here on follow-up after presenting to [facility] ER with V-fib arrest, requiring defibrillation and emergent cardiac cath. He was diagnosed with a posterior lateral MI.  Cath revealed normal LM, LAD, 99% stenosis of the L Cx just prior to the bifurcation of the first OM. RCA was normal.  EF 55%.He had subsequent PCI with BMS. He had transient Atrial fib post procedure. He also had transient bradycardia and hypotension on BB, but resolved on discharge and was sent home on lopressor 50 mg BID.  He was started on Effient but could not afford and switched to Plavix as this was paid by Medicaid. He has quit smoking since discharge. He denies any further complaints of chest pain. Occasional substernal burning when he bends over, or with coughing. He is starting cardiac rehab on 12/09/11. He denies bleeding. There is some muscle soreness in his thighs but not consistent or debilitating.  Allergies  Allergen Reactions  . Penicillins Anaphylaxis    Current Outpatient Prescriptions  Medication Sig Dispense Refill  . acetaminophen (TYLENOL) 500 MG tablet Take 1,000 mg by mouth every 6 (six) hours as needed. For headaches      . albuterol (PROVENTIL HFA;VENTOLIN HFA) 108 (90 BASE) MCG/ACT inhaler Inhale 2 puffs into the lungs every 4 (four) hours as needed for wheezing or shortness of breath.  1 Inhaler  3  . aspirin EC 81 MG EC tablet Take 1 tablet (81 mg total) by mouth daily.  30 tablet  3  . atorvastatin (LIPITOR) 80 MG tablet Take 80 mg by mouth daily.      . clopidogrel (PLAVIX) 75 MG tablet Take 75 mg by mouth daily.      Marland Kitchen HYDROcodone-acetaminophen (NORCO) 5-325 MG per tablet Take 0.5-1 tablets by mouth every 4 (four) hours as needed. For pain      . metoprolol (LOPRESSOR) 50 MG tablet Take 1 tablet (50 mg total) by mouth 2 (two) times daily.  60 tablet  3  . nitroGLYCERIN (NITROSTAT) 0.4 MG SL tablet Place 1 tablet (0.4 mg total) under the tongue  every 5 (five) minutes as needed for chest pain.  25 tablet  12    Past Medical History  Diagnosis Date  . Migraine   . STEMI (ST elevation myocardial infarction)   . History of ventricular fibrillation     S/p cardioversion  . CAD (coronary artery disease)     s/p BMS to mid LCx    Past Surgical History  Procedure Date  . Splenectomy   . Back surgery     [id number] of systems complete and found to be negative unless listed above  PHYSICAL EXAM BP 101/66  Pulse 54  Resp 16  Ht 5\' 10"  (1.778 m)  Wt 268 lb (121.564 kg)  BMI 38.45 kg/m2  General: Well developed, well nourished, in no acute distress Head: Eyes PERRLA, No xanthomas.   Normal cephalic and atramatic  Lungs: Clear bilaterally to auscultation and percussion. Heart: HRRR S1 S2, without MRG.  Pulses are 2+ & equal.            No carotid bruit. No JVD.  No abdominal bruits. No femoral bruits. Abdomen: Bowel sounds are positive, abdomen soft and non-tender without masses or                  Hernia's noted. Msk:  Back normal, normal gait. Normal strength and tone for age. Extremities: No clubbing,  cyanosis or edema.  DP +1 Neuro: Alert and oriented X 3. Psych:  Good affect, responds appropriately    ASSESSMENT AND PLAN

## 2011-12-08 NOTE — Assessment & Plan Note (Signed)
NO recurrence of arrythmia or palpitations per pt. Continue lopressor as tolerated.

## 2011-12-08 NOTE — Patient Instructions (Signed)
Follow up with fasting lipids and lfts in 6 weeks.  Your physician wants you to follow-up in: 3 months with Lorin Picket NP You will receive a reminder letter in the mail two months in advance. If you don't receive a letter, please call our office to schedule the follow-up appointment.

## 2011-12-09 ENCOUNTER — Encounter (HOSPITAL_COMMUNITY)
Admission: RE | Admit: 2011-12-09 | Discharge: 2011-12-09 | Disposition: A | Discharge status: Home / Self Care | Payer: Medicaid Other | Source: Ambulatory Visit | Attending: Cardiology | Admitting: Cardiology

## 2011-12-12 ENCOUNTER — Other Ambulatory Visit: Payer: Self-pay | Admitting: Adult Health

## 2011-12-12 ENCOUNTER — Encounter (HOSPITAL_COMMUNITY)
Admission: RE | Admit: 2011-12-12 | Discharge: 2011-12-12 | Disposition: A | Discharge status: Home / Self Care | Payer: Medicaid Other | Source: Ambulatory Visit | Attending: Cardiology | Admitting: Cardiology

## 2011-12-12 LAB — HEPATIC FUNCTION PANEL
ALT: 77 U/L — ABNORMAL HIGH (ref 0–53)
Albumin: 4.1 g/dL (ref 3.5–5.2)
Total Protein: 6.7 g/dL (ref 6.0–8.3)

## 2011-12-12 LAB — LIPID PANEL
Cholesterol: 105 mg/dL (ref 0–200)
HDL: 35 mg/dL — ABNORMAL LOW (ref >39)
Total CHOL/HDL Ratio: 3 Ratio
Triglycerides: 99 mg/dL (ref <150)
VLDL: 20 mg/dL (ref 0–40)

## 2011-12-14 ENCOUNTER — Encounter (HOSPITAL_COMMUNITY)
Admission: RE | Admit: 2011-12-14 | Discharge: 2011-12-14 | Disposition: A | Discharge status: Home / Self Care | Payer: Medicaid Other | Source: Ambulatory Visit

## 2011-12-15 ENCOUNTER — Other Ambulatory Visit: Payer: Self-pay

## 2011-12-15 MED ORDER — PRAVASTATIN SODIUM 40 MG PO TABS: 40.0000 mg | ORAL_TABLET | Freq: Every day | ORAL | Status: DC

## 2011-12-16 ENCOUNTER — Encounter (HOSPITAL_COMMUNITY)
Admission: RE | Admit: 2011-12-16 | Discharge: 2011-12-16 | Disposition: A | Discharge status: Home / Self Care | Payer: Medicaid Other | Source: Ambulatory Visit | Attending: Cardiology | Admitting: Cardiology

## 2011-12-16 DIAGNOSIS — Z5189 Encounter for other specified aftercare: Secondary | ICD-10-CM | POA: Insufficient documentation

## 2011-12-16 DIAGNOSIS — I2510 Atherosclerotic heart disease of native coronary artery without angina pectoris: Secondary | ICD-10-CM | POA: Insufficient documentation

## 2011-12-16 DIAGNOSIS — I252 Old myocardial infarction: Secondary | ICD-10-CM | POA: Insufficient documentation

## 2011-12-16 DIAGNOSIS — Z9861 Coronary angioplasty status: Secondary | ICD-10-CM | POA: Insufficient documentation

## 2011-12-19 ENCOUNTER — Encounter (HOSPITAL_COMMUNITY)
Admission: RE | Admit: 2011-12-19 | Discharge: 2011-12-19 | Disposition: A | Discharge status: Home / Self Care | Payer: Medicaid Other | Source: Ambulatory Visit | Attending: Cardiology | Admitting: Cardiology

## 2011-12-21 ENCOUNTER — Encounter (HOSPITAL_COMMUNITY)
Admission: RE | Admit: 2011-12-21 | Discharge: 2011-12-21 | Disposition: A | Discharge status: Home / Self Care | Payer: Medicaid Other | Source: Ambulatory Visit | Attending: Cardiology | Admitting: Cardiology

## 2011-12-23 ENCOUNTER — Encounter (HOSPITAL_COMMUNITY)
Admission: RE | Admit: 2011-12-23 | Discharge: 2011-12-23 | Disposition: A | Discharge status: Home / Self Care | Payer: Medicaid Other | Source: Ambulatory Visit | Attending: Cardiology | Admitting: Cardiology

## 2011-12-26 ENCOUNTER — Encounter (HOSPITAL_COMMUNITY)
Admission: RE | Admit: 2011-12-26 | Discharge: 2011-12-26 | Disposition: A | Discharge status: Home / Self Care | Payer: Medicaid Other | Source: Ambulatory Visit | Attending: Cardiology | Admitting: Cardiology

## 2011-12-28 ENCOUNTER — Encounter (HOSPITAL_COMMUNITY): Payer: Medicaid Other

## 2011-12-30 ENCOUNTER — Encounter (HOSPITAL_COMMUNITY)
Admission: RE | Admit: 2011-12-30 | Discharge: 2011-12-30 | Disposition: A | Discharge status: Home / Self Care | Payer: Medicaid Other | Source: Ambulatory Visit | Attending: Cardiology | Admitting: Cardiology

## 2011-12-30 DIAGNOSIS — Z9861 Coronary angioplasty status: Secondary | ICD-10-CM

## 2012-01-02 ENCOUNTER — Encounter (HOSPITAL_COMMUNITY)
Admission: RE | Admit: 2012-01-02 | Discharge: 2012-01-02 | Disposition: A | Discharge status: Home / Self Care | Payer: Medicaid Other | Source: Ambulatory Visit | Attending: Cardiology | Admitting: Cardiology

## 2012-01-02 ENCOUNTER — Telehealth: Payer: Self-pay | Admitting: Adult Health

## 2012-01-02 NOTE — Telephone Encounter (Signed)
PT HAS BEEN SET UP TO SEE DR WALL ON 01/06/12. PT HAS NOT SEE A "DOCTOR" ONLY NP OR PA.

## 2012-01-02 NOTE — Telephone Encounter (Signed)
DIANE FROM CARDIAC REHAB CALLED FOR PATIENT.  PT BP HAS BEEN LOW, HE STATES THAT HE DOESN'T FEEL BAD AT ALL.  BEFORE EXERCISE TODAY BP WAS 87/50 AND DURING EXERCISE NEVER GOT ABOVE 100/80.  WITH KATHRYN BEING OFF SICK, WAS NOT SURE WHAT TO DO AS FAR AS MAKING A APPOINTMENT.

## 2012-01-02 NOTE — Telephone Encounter (Signed)
PT HAS BEEN SET UP TO SEE DR WALL ON 01/06/12/TMJ

## 2012-01-04 ENCOUNTER — Encounter (HOSPITAL_COMMUNITY)
Admission: RE | Admit: 2012-01-04 | Discharge: 2012-01-04 | Disposition: A | Discharge status: Home / Self Care | Payer: Medicaid Other | Source: Ambulatory Visit | Attending: Cardiology | Admitting: Cardiology

## 2012-01-06 ENCOUNTER — Ambulatory Visit (INDEPENDENT_AMBULATORY_CARE_PROVIDER_SITE_OTHER): Payer: Medicaid Other | Admitting: Cardiology

## 2012-01-06 ENCOUNTER — Encounter: Payer: Self-pay | Admitting: Cardiology

## 2012-01-06 ENCOUNTER — Encounter (HOSPITAL_COMMUNITY)
Admission: RE | Admit: 2012-01-06 | Discharge: 2012-01-06 | Disposition: A | Discharge status: Home / Self Care | Payer: Medicaid Other | Source: Ambulatory Visit | Attending: Cardiology | Admitting: Cardiology

## 2012-01-06 VITALS — BP 96/61 | HR 43 | Resp 16 | Wt 274.0 lb

## 2012-01-06 DIAGNOSIS — E782 Mixed hyperlipidemia: Secondary | ICD-10-CM

## 2012-01-06 DIAGNOSIS — I2510 Atherosclerotic heart disease of native coronary artery without angina pectoris: Secondary | ICD-10-CM | POA: Insufficient documentation

## 2012-01-06 MED ORDER — METOPROLOL TARTRATE 25 MG PO TABS: 25.0000 mg | ORAL_TABLET | Freq: Two times a day (BID) | ORAL | Status: DC

## 2012-01-06 NOTE — Progress Notes (Signed)
HPI Mr Nathan Waters Returns for evaluation and management his history of coronary disease status post MI.  He has done well not smoking and is taking all his meds. His blood pressure heart rate low today. He says he feels sleepy but denies any presyncope or syncope.    He is involved in cardiac rehabilitation. He is very compliant. Recent labs showed his LDL to be at goal.  He's had no recurrent angina. He did have one episode of sharp pain that was very short lived which he took a nitroglycerin for. It gave him a headache.    Past Medical History  Diagnosis Date  . Migraine   . STEMI (ST elevation myocardial infarction)   . History of ventricular fibrillation     S/p cardioversion  . CAD (coronary artery disease)     s/p BMS to mid LCx    Current Outpatient Prescriptions  Medication Sig Dispense Refill  . acetaminophen (TYLENOL) 500 MG tablet Take 1,000 mg by mouth every 6 (six) hours as needed. For headaches      . albuterol (PROVENTIL HFA;VENTOLIN HFA) 108 (90 BASE) MCG/ACT inhaler Inhale 2 puffs into the lungs every 4 (four) hours as needed for wheezing or shortness of breath.  1 Inhaler  3  . aspirin EC 81 MG EC tablet Take 1 tablet (81 mg total) by mouth daily.  30 tablet  3  . clopidogrel (PLAVIX) 75 MG tablet Take 75 mg by mouth daily.      Marland Kitchen HYDROcodone-acetaminophen (NORCO) 5-325 MG per tablet Take 0.5-1 tablets by mouth every 4 (four) hours as needed. For pain      . metoprolol (LOPRESSOR) 25 MG tablet Take 1 tablet (25 mg total) by mouth 2 (two) times daily.  60 tablet  3  . nitroGLYCERIN (NITROSTAT) 0.4 MG SL tablet Place 1 tablet (0.4 mg total) under the tongue every 5 (five) minutes as needed for chest pain.  25 tablet  12  . pravastatin (PRAVACHOL) 40 MG tablet Take 1 tablet (40 mg total) by mouth daily.  30 tablet  3    Allergies  Allergen Reactions  . Penicillins Anaphylaxis    Family History  Problem Relation Age of Onset  . Malignant hyperthermia Mother   .  Malignant hyperthermia Brother   . Heart disease Brother   . Heart disease Father     History   Social History  . Marital Status: Divorced    Spouse Name: N/A    Number of Children: N/A  . Years of Education: N/A   Occupational History  . Not on file.   Social History Main Topics  . Smoking status: Former Smoker -- 2.0 packs/day    Types: Cigarettes  . Smokeless tobacco: Not on file  . Alcohol Use: 1.2 oz/week    2 Cans of beer per week     occasionally  . Drug Use: No  . Sexually Active: Not on file   Other Topics Concern  . Not on file   Social History Narrative  . No narrative on file    ROS ALL NEGATIVE EXCEPT THOSE NOTED IN HPI  PE  General Appearance: well developed, well nourished in no acute distress, obese HEENT: symmetrical face, PERRLA, good dentition  Neck: no JVD, thyromegaly, or adenopathy, trachea midline Chest: symmetric without deformity Cardiac: PMI non-displaced, RRR, normal S1, S2, no gallop or murmur Lung: clear to ausculation and percussion Vascular: all pulses full without bruits  Abdominal: nondistended, nontender, good bowel sounds, no HSM,  no bruits Extremities: no cyanosis, clubbing or edema, no sign of DVT, no varicosities  Skin: normal color, no rashes Neuro: alert and oriented x 3, non-focal Pysch: normal affect  EKG  BMET    Component Value Date/Time   NA 136 11/24/2011 0535   K 3.9 11/24/2011 0535   CL 103 11/24/2011 0535   CO2 23 11/24/2011 0535   GLUCOSE 111* 11/24/2011 0535   BUN 10 11/24/2011 0535   CREATININE 0.97 11/24/2011 0535   CALCIUM 9.7 11/24/2011 0535   GFRNONAA >90 11/24/2011 0535   GFRAA >90 11/24/2011 0535    Lipid Panel     Component Value Date/Time   CHOL 105 12/12/2011 1223   TRIG 99 12/12/2011 1223   HDL 35* 12/12/2011 1223   CHOLHDL 3.0 12/12/2011 1223   VLDL 20 12/12/2011 1223   LDLCALC 50 12/12/2011 1223    CBC    Component Value Date/Time   WBC 16.9* 11/24/2011 0535   RBC 5.03 11/24/2011 0535   HGB 16.1  11/24/2011 0535   HCT 47.0 11/24/2011 0535   PLT 270 11/24/2011 0535   MCV 93.4 11/24/2011 0535   MCH 32.0 11/24/2011 0535   MCHC 34.3 11/24/2011 0535   RDW 14.2 11/24/2011 0535   LYMPHSABS 4.4* 11/23/2011 0740   MONOABS 1.4* 11/23/2011 0740   EOSABS 0.1 11/23/2011 0740   BASOSABS 0.0 11/23/2011 0740

## 2012-01-06 NOTE — Assessment & Plan Note (Signed)
Lipids are at goal except for low HDL. Continue not smoking no change in his pravastatin dose.

## 2012-01-06 NOTE — Assessment & Plan Note (Signed)
Doing well. His blood pressure and his heart rates are low. We'll decrease his Lopressor to 25 mg twice a day. We'll hold his dose this evening. Followup with me in 3 months.

## 2012-01-06 NOTE — Patient Instructions (Signed)
**Note De-Identified Cian Costanzo Obfuscation** Your physician has recommended you make the following change in your medication: Do not take Lopressor (Metoprolol) tonight then decrease to 25 mg twice daily  Your physician recommends that you schedule a follow-up appointment in: 3 months

## 2012-01-09 ENCOUNTER — Encounter (HOSPITAL_COMMUNITY): Payer: Medicaid Other

## 2012-01-11 ENCOUNTER — Encounter (HOSPITAL_COMMUNITY): Payer: Medicaid Other

## 2012-01-13 ENCOUNTER — Encounter (HOSPITAL_COMMUNITY): Payer: Medicaid Other

## 2012-01-16 ENCOUNTER — Encounter (HOSPITAL_COMMUNITY): Payer: Medicaid Other

## 2012-01-18 ENCOUNTER — Encounter (HOSPITAL_COMMUNITY): Payer: Medicaid Other

## 2012-01-20 ENCOUNTER — Encounter (HOSPITAL_COMMUNITY): Payer: Medicaid Other

## 2012-01-23 ENCOUNTER — Ambulatory Visit: Payer: Medicaid Other | Admitting: Cardiology

## 2012-01-23 ENCOUNTER — Encounter (HOSPITAL_COMMUNITY): Payer: Medicaid Other

## 2012-01-25 ENCOUNTER — Encounter (HOSPITAL_COMMUNITY): Payer: Medicaid Other

## 2012-01-27 ENCOUNTER — Encounter (HOSPITAL_COMMUNITY): Payer: Medicaid Other

## 2012-01-30 ENCOUNTER — Encounter (HOSPITAL_COMMUNITY): Payer: Medicaid Other

## 2012-02-01 ENCOUNTER — Encounter (HOSPITAL_COMMUNITY): Payer: Medicaid Other

## 2012-02-03 ENCOUNTER — Encounter (HOSPITAL_COMMUNITY): Payer: Medicaid Other

## 2012-02-03 NOTE — Progress Notes (Signed)
Cardiac Rehabilitation Program Outcomes Report   Orientation:  11/28/2011 Graduate Date:   Discharge Date:  01/06/2012 # of sessions completed: 12  [id number] Infarction  Cardiologist: [place] Bing Family MD:  Health Dept Class Time:  11:00  A.  Exercise Program:  Tolerates exercise @ 3.0 METS for 15 minutes and Discharged  B.  Mental Health:  Good mental attitude  C.  Education/Instruction/Skills  Knows THR for exercise and Uses Perceived Exertion Scale and/or Dyspnea Scale  Uses Perceived Exertion Scale and/or Dyspnea Scale  D.  Nutrition/Weight Control/Body Composition:  Adherence to prescribed nutrition program: good    E.  Blood Lipids    Lab Results  Component Value Date   CHOL 105 12/12/2011   HDL 35* 12/12/2011   LDLCALC 50 12/12/2011   TRIG 99 12/12/2011   CHOLHDL 3.0 12/12/2011    F.  Lifestyle Changes:  Making positive lifestyle changes  G.  Symptoms noted with exercise:  Asymptomatic  Report Completed By:  Lelon Huh. [name] RN   Comments:  Patient did well while in rehab. He achieved a peak Mets of 3.0. His resting HR was 42 and His resting BP is 90/60. His peak HR is 85 and his peak BP is 110/60. He attended 12 sessions and never returned to rehab No call either.

## 2012-02-06 ENCOUNTER — Encounter (HOSPITAL_COMMUNITY): Payer: Self-pay | Admitting: *Deleted

## 2012-02-06 ENCOUNTER — Encounter (HOSPITAL_COMMUNITY): Payer: Medicaid Other

## 2012-02-06 ENCOUNTER — Emergency Department (HOSPITAL_COMMUNITY)
Admission: EM | Admit: 2012-02-06 | Discharge: 2012-02-07 | Disposition: A | Discharge status: Home / Self Care | Payer: Medicaid Other | Attending: Emergency Medicine | Admitting: Emergency Medicine

## 2012-02-06 ENCOUNTER — Emergency Department (HOSPITAL_COMMUNITY): Payer: Medicaid Other

## 2012-02-06 DIAGNOSIS — R079 Chest pain, unspecified: Secondary | ICD-10-CM | POA: Insufficient documentation

## 2012-02-06 DIAGNOSIS — I498 Other specified cardiac arrhythmias: Secondary | ICD-10-CM | POA: Insufficient documentation

## 2012-02-06 DIAGNOSIS — I4891 Unspecified atrial fibrillation: Secondary | ICD-10-CM | POA: Insufficient documentation

## 2012-02-06 DIAGNOSIS — F17200 Nicotine dependence, unspecified, uncomplicated: Secondary | ICD-10-CM | POA: Insufficient documentation

## 2012-02-06 DIAGNOSIS — I252 Old myocardial infarction: Secondary | ICD-10-CM | POA: Insufficient documentation

## 2012-02-06 DIAGNOSIS — I2510 Atherosclerotic heart disease of native coronary artery without angina pectoris: Secondary | ICD-10-CM | POA: Insufficient documentation

## 2012-02-06 LAB — CBC
MCH: 32 pg (ref 26.0–34.0)
Platelets: 368 10*3/uL (ref 150–400)
RBC: 4.5 MIL/uL (ref 4.22–5.81)
WBC: 11.1 10*3/uL — ABNORMAL HIGH (ref 4.0–10.5)

## 2012-02-06 LAB — POCT I-STAT TROPONIN I: Troponin i, poc: 0.01 ng/mL (ref 0.00–0.08)

## 2012-02-06 LAB — BASIC METABOLIC PANEL
CO2: 27 mEq/L (ref 19–32)
Calcium: 9.7 mg/dL (ref 8.4–10.5)
Potassium: 3.5 mEq/L (ref 3.5–5.1)
Sodium: 139 mEq/L (ref 135–145)

## 2012-02-06 LAB — D-DIMER, QUANTITATIVE: D-Dimer, Quant: 0.4 ug/mL-FEU (ref 0.00–0.48)

## 2012-02-06 MED ORDER — ASPIRIN 81 MG PO CHEW
324.0000 mg | CHEWABLE_TABLET | Freq: Once | ORAL | Status: AC
  Administered 2012-02-06: 324 mg via ORAL
  Filled 2012-02-06: qty 4

## 2012-02-06 NOTE — ED Provider Notes (Signed)
History   This chart was scribed for Shelda Jakes, MD by Toya Smothers. The patient was seen in room APA11/APA11. Patient's care was started at 2021.   CSN: [id number]  Arrival date & time 02/06/12  2021   First MD Initiated Contact with Patient 02/06/12 2058      Chief Complaint  Patient presents with  . Chest Pain    (Consider location/radiation/quality/duration/timing/severity/associated sxs/prior treatment) HPI Nathan Waters is a 31 y.o. male who presents to the Emergency Department complaining of constant gradual chest pain radiating from left chest to left shoulder onset 5 hrs ago while walking and persistent since. Pt noted increased pain with movement and states pain was relieved with rest. Pt denies nausea, vomiting, SOB, fever, chills, and abdominal pain. Pt took NTG with no relief. Pt took ASA 81mg  this morning. Pt has had splenectomy and h/o MI 2 months ago and had a cardiac stent placed at that time. Pt list cardiologist as [place]   Past Medical History  Diagnosis Date  . Migraine   . STEMI (ST elevation myocardial infarction)   . History of ventricular fibrillation     S/p cardioversion  . CAD (coronary artery disease)     s/p BMS to mid LCx    Past Surgical History  Procedure Date  . Splenectomy   . Back surgery     Family History  Problem Relation Age of Onset  . Malignant hyperthermia Mother   . Malignant hyperthermia Brother   . Heart disease Brother   . Heart disease Father     History  Substance Use Topics  . Smoking status: Current Everyday Smoker -- 2.0 packs/day    Types: Cigarettes  . Smokeless tobacco: Not on file  . Alcohol Use: 1.2 oz/week    2 Cans of beer per week     occasionally      Review of Systems  Constitutional: Negative for fever and chills.  HENT: Negative for rhinorrhea and neck pain.   Eyes: Negative for pain.  Respiratory: Negative for cough and shortness of breath.   Cardiovascular: Positive for chest pain.   Gastrointestinal: Negative for nausea, vomiting, abdominal pain and diarrhea.  Genitourinary: Negative for dysuria.  Musculoskeletal: Negative for back pain.  Skin: Negative for pallor and wound.  Neurological: Negative for dizziness and weakness.    Allergies  Penicillins  Home Medications   Current Outpatient Rx  Name Route Sig Dispense Refill  . ALBUTEROL SULFATE HFA 108 (90 BASE) MCG/ACT IN AERS Inhalation Inhale 2 puffs into the lungs every 4 (four) hours as needed for wheezing or shortness of breath. 1 Inhaler 3  . ASPIRIN 81 MG PO TBEC Oral Take 81 mg by mouth every morning.    Marland Kitchen CLOPIDOGREL BISULFATE 75 MG PO TABS Oral Take 75 mg by mouth every morning.     Marland Kitchen METOPROLOL TARTRATE 25 MG PO TABS Oral Take 12.5 mg by mouth 2 (two) times daily.    Marland Kitchen NITROGLYCERIN 0.4 MG SL SUBL Sublingual Place 1 tablet (0.4 mg total) under the tongue every 5 (five) minutes as needed for chest pain. 25 tablet 12  . PRAVASTATIN SODIUM 40 MG PO TABS Oral Take 40 mg by mouth every evening.      BP 121/67  Pulse 88  Temp(Src) 98.8 F (37.1 C) (Oral)  Resp 20  Wt 270 lb (122.471 kg)  SpO2 97%  Physical Exam  Nursing note and vitals reviewed. Constitutional: He is oriented to person, place, and time.  He appears well-developed and well-nourished. No distress.  HENT:  Head: Normocephalic and atraumatic.  Mouth/Throat: Oropharynx is clear and moist.  Eyes: EOM are normal. Pupils are equal, round, and reactive to light.  Neck: Neck supple. No tracheal deviation present.  Cardiovascular: Normal rate, regular rhythm and intact distal pulses.  Exam reveals no gallop and no friction rub.   No murmur heard. Pulmonary/Chest: Effort normal. No respiratory distress. He has no wheezes.  Abdominal: Soft. Bowel sounds are normal. He exhibits no distension. There is no tenderness. There is no rebound.  Musculoskeletal: Normal range of motion. He exhibits no edema.  Neurological: He is alert and oriented to  person, place, and time. No sensory deficit.  Skin: Skin is warm and dry.  Psychiatric: He has a normal mood and affect. His behavior is normal.    ED Course  Procedures (including critical care time) DIAGNOSTIC STUDIES: Oxygen Saturation is 95% on room air, adequate by my interpretation.    COORDINATION OF CARE: 9:55PM- Patient informed of current plan for treatment and evaluation and agrees with plan at this time.     Labs Reviewed  CBC - Abnormal; Notable for the following:    WBC 11.1 (*)    All other components within normal limits  BASIC METABOLIC PANEL  POCT I-STAT TROPONIN I  D-DIMER, QUANTITATIVE  TROPONIN I   Chest Portable 1 View  02/06/2012  *RADIOLOGY REPORT*  Clinical Data: Chest pain  PORTABLE CHEST - 1 VIEW  Comparison: 11/23/2011  Findings: Lungs clear.  Heart size and pulmonary vascularity normal.  No effusion.  Visualized bones unremarkable.  IMPRESSION: No acute disease  Original Report Authenticated By: Osa Craver, M.D.    Date: 02/07/2012  Rate: 66  Rhythm: normal sinus rhythm and sinus arrhythmia  QRS Axis: normal  Intervals: normal  ST/T Wave abnormalities: normal  Conduction Disutrbances:none  Narrative Interpretation:   Old EKG Reviewed: changes noted EKG compared to 11/23/2011 shows some peaking of T waves in the lateral leads. No change in inferior leads no change in anterior posterior leads.  Results for orders placed during the hospital encounter of 02/06/12  CBC      Component Value Range   WBC 11.1 (*) 4.0 - 10.5 (K/uL)   RBC 4.50  4.22 - 5.81 (MIL/uL)   Hemoglobin 14.4  13.0 - 17.0 (g/dL)   HCT 09.8  11.9 - 14.7 (%)   MCV 94.2  78.0 - 100.0 (fL)   MCH 32.0  26.0 - 34.0 (pg)   MCHC 34.0  30.0 - 36.0 (g/dL)   RDW 82.9  56.2 - 13.0 (%)   Platelets 368  150 - 400 (K/uL)  BASIC METABOLIC PANEL      Component Value Range   Sodium 139  135 - 145 (mEq/L)   Potassium 3.5  3.5 - 5.1 (mEq/L)   Chloride 104  96 - 112 (mEq/L)   CO2  27  19 - 32 (mEq/L)   Glucose, Bld 90  70 - 99 (mg/dL)   BUN 11  6 - 23 (mg/dL)   Creatinine, Ser 8.65  0.50 - 1.35 (mg/dL)   Calcium 9.7  8.4 - 78.4 (mg/dL)   GFR calc non Af Amer >90  >90 (mL/min)   GFR calc Af Amer >90  >90 (mL/min)  POCT I-STAT TROPONIN I      Component Value Range   Troponin i, poc 0.01  0.00 - 0.08 (ng/mL)   Comment 3  D-DIMER, QUANTITATIVE      Component Value Range   D-Dimer, Quant 0.40  0.00 - 0.48 (ug/mL-FEU)     1. Chest pain       MDM  Patient well-known to me was involved with his acute posterior MI and cardiac arrest that required defibrillation to bring him back. He then went on to cardiac catheterization had stent placed in the circumflex artery. Patient with onset of chest pain tonight may be musculoskeletal but still of concern is started at 4:30 in the afternoon first troponin being negative is promising. EKG was some mild T wave changes there is more as a pronouncement of the T waves compared to previous EKG noted.  Second cardiac marker if negative will be after the six-hour mark patient can probably be discharged home. No chest pain currently no chest pain with taking a deep breath as before. Only gets chest pain now with some movement of the left arm into certain positions. This is more supportive of musculoskeletal chest pain. As noted in the history patient did take his baby aspirin today and also took one sublingual nitroglycerin when this first started without any relief. But this resolved on its own over time. Again as stated if the second cardiac marker beyond 6 hours is negative this is not likely to be related to cardiac event can be discharged and followup will of our cardiology on an outpatient basis.  Second cardiac marker was negative not even slightly elevated. Okay to discharge patient home with followup with cardiology patient will return for any new or worse or recurrent symptoms not resolved with nitroglycerin.    I  personally performed the services described in this documentation, which was scribed in my presence. The recorded information has been reviewed and considered.      Shelda Jakes, MD 02/07/12 [phone]

## 2012-02-06 NOTE — ED Notes (Signed)
Chest pain for 1-2 hours, took 1 ntg without relief

## 2012-02-06 NOTE — ED Notes (Signed)
Patient lying in bed with family at bedside. States pain is "no better but no worse." Denies diaphoresis, nausea / vomiting, radiating pain. States "I feel like it might be a pulled muscle." No obvious distress at this time. Patient has generalized rash and states he has had it for several days. Complains of rash itching.

## 2012-02-07 NOTE — Discharge Instructions (Signed)
Cardiac markers x2 negative including the last one which is after 6 hours. Make appointment with cardiology for followup. Return for any recurrent chest pain lasting 15 minutes or longer her new or worse symptoms. Remember to take her nitroglycerin for recurrent chest pain if it does not resolve in 15 minutes then return.

## 2012-02-08 ENCOUNTER — Encounter (HOSPITAL_COMMUNITY): Payer: Medicaid Other

## 2012-02-10 ENCOUNTER — Encounter (HOSPITAL_COMMUNITY): Payer: Medicaid Other

## 2012-02-13 ENCOUNTER — Encounter (HOSPITAL_COMMUNITY): Payer: Medicaid Other

## 2012-02-15 ENCOUNTER — Encounter (HOSPITAL_COMMUNITY): Payer: Medicaid Other

## 2012-02-17 ENCOUNTER — Encounter (HOSPITAL_COMMUNITY): Payer: Medicaid Other

## 2012-02-20 ENCOUNTER — Encounter (HOSPITAL_COMMUNITY): Payer: Medicaid Other

## 2012-02-22 ENCOUNTER — Encounter (HOSPITAL_COMMUNITY): Payer: Medicaid Other

## 2012-02-24 ENCOUNTER — Encounter (HOSPITAL_COMMUNITY): Payer: Medicaid Other

## 2012-02-27 ENCOUNTER — Encounter (HOSPITAL_COMMUNITY): Payer: Medicaid Other

## 2012-02-29 ENCOUNTER — Encounter (HOSPITAL_COMMUNITY): Payer: Medicaid Other

## 2012-03-02 ENCOUNTER — Encounter (HOSPITAL_COMMUNITY): Payer: Medicaid Other

## 2012-03-05 ENCOUNTER — Encounter (HOSPITAL_COMMUNITY): Payer: Medicaid Other

## 2012-03-07 ENCOUNTER — Encounter (HOSPITAL_COMMUNITY): Payer: Medicaid Other

## 2012-03-09 ENCOUNTER — Encounter (HOSPITAL_COMMUNITY): Payer: Medicaid Other

## 2012-03-09 ENCOUNTER — Ambulatory Visit: Payer: Self-pay | Admitting: Adult Health

## 2012-03-12 ENCOUNTER — Encounter (HOSPITAL_COMMUNITY): Payer: Medicaid Other

## 2012-04-09 ENCOUNTER — Encounter: Payer: Self-pay | Admitting: Cardiology

## 2012-04-09 ENCOUNTER — Ambulatory Visit (INDEPENDENT_AMBULATORY_CARE_PROVIDER_SITE_OTHER): Payer: Medicaid Other | Admitting: Cardiology

## 2012-04-09 VITALS — BP 115/77 | HR 77 | Resp 18 | Ht 71.0 in | Wt 267.0 lb

## 2012-04-09 DIAGNOSIS — E782 Mixed hyperlipidemia: Secondary | ICD-10-CM

## 2012-04-09 DIAGNOSIS — F17200 Nicotine dependence, unspecified, uncomplicated: Secondary | ICD-10-CM

## 2012-04-09 DIAGNOSIS — Z720 Tobacco use: Secondary | ICD-10-CM | POA: Insufficient documentation

## 2012-04-09 DIAGNOSIS — I2510 Atherosclerotic heart disease of native coronary artery without angina pectoris: Secondary | ICD-10-CM

## 2012-04-09 MED ORDER — BUPROPION HCL ER (XL) 150 MG PO TB24: 150.0000 mg | ORAL_TABLET | Freq: Every day | ORAL | Status: DC

## 2012-04-09 NOTE — Patient Instructions (Signed)
Your physician recommends that you schedule a follow-up appointment in: 6 months   Your physician has recommended you make the following change in your medication:  1 - START Wellbutrin 150 mg twice a day for smoking cessation

## 2012-04-09 NOTE — Assessment & Plan Note (Signed)
Stable. No change in medications. Stop smoking. See back in the office in 6 months.

## 2012-04-09 NOTE — Assessment & Plan Note (Signed)
Repeat labs in February 2014.

## 2012-04-09 NOTE — Progress Notes (Signed)
HPI Nathan Waters returns today for evaluation and management coronary artery disease. He's had no angina or chest pain. He has started smoking again because she's anxious all the time. Is worse during the day. He is up about a half-pack cigarettes a day.  He is compliant with his medications. He is looking for a job.  Past Medical History  Diagnosis Date  . Migraine   . STEMI (ST elevation myocardial infarction)   . History of ventricular fibrillation     S/p cardioversion  . CAD (coronary artery disease)     s/p BMS to mid LCx    Current Outpatient Prescriptions  Medication Sig Dispense Refill  . albuterol (PROVENTIL HFA;VENTOLIN HFA) 108 (90 BASE) MCG/ACT inhaler Inhale 2 puffs into the lungs every 4 (four) hours as needed for wheezing or shortness of breath.  1 Inhaler  3  . aspirin 81 MG EC tablet Take 81 mg by mouth every morning.      . clopidogrel (PLAVIX) 75 MG tablet Take 75 mg by mouth every morning.       . metoprolol tartrate (LOPRESSOR) 25 MG tablet Take 25 mg by mouth 2 (two) times daily.       . nitroGLYCERIN (NITROSTAT) 0.4 MG SL tablet Place 1 tablet (0.4 mg total) under the tongue every 5 (five) minutes as needed for chest pain.  25 tablet  12  . pravastatin (PRAVACHOL) 40 MG tablet Take 40 mg by mouth every evening.        Allergies  Allergen Reactions  . Penicillins Anaphylaxis    Family History  Problem Relation Age of Onset  . Malignant hyperthermia Mother   . Malignant hyperthermia Brother   . Heart disease Brother   . Heart disease Father     History   Social History  . Marital Status: Divorced    Spouse Name: N/A    Number of Children: N/A  . Years of Education: N/A   Occupational History  . Not on file.   Social History Main Topics  . Smoking status: Current Everyday Smoker -- 2.0 packs/day    Types: Cigarettes  . Smokeless tobacco: Not on file  . Alcohol Use: 1.2 oz/week    2 Cans of beer per week     occasionally  . Drug Use: No  .  Sexually Active: Yes    Birth Control/ Protection: Rhythm   Other Topics Concern  . Not on file   Social History Narrative  . No narrative on file    ROS ALL NEGATIVE EXCEPT THOSE NOTED IN HPI  PE  General Appearance: well developed, well nourished in no acute distress, obese HEENT: symmetrical face, PERRLA, good dentition  Neck: no JVD, thyromegaly, or adenopathy, trachea midline Chest: symmetric without deformity Cardiac: PMI non-displaced, RRR, normal S1, S2, no gallop or murmur Lung: clear to ausculation and percussion Vascular: all pulses full without bruits  Abdominal: nondistended, nontender, good bowel sounds, no HSM, no bruits Extremities: no cyanosis, clubbing or edema, no sign of DVT, no varicosities  Skin: normal color, no rashes Neuro: alert and oriented x 3, non-focal Pysch: normal affect  EKG  BMET    Component Value Date/Time   NA 139 02/06/2012 2036   K 3.5 02/06/2012 2036   CL 104 02/06/2012 2036   CO2 27 02/06/2012 2036   GLUCOSE 90 02/06/2012 2036   BUN 11 02/06/2012 2036   CREATININE 0.85 02/06/2012 2036   CALCIUM 9.7 02/06/2012 2036   GFRNONAA >90 02/06/2012 2036  GFRAA >90 02/06/2012 2036    Lipid Panel     Component Value Date/Time   CHOL 105 12/12/2011 1223   TRIG 99 12/12/2011 1223   HDL 35* 12/12/2011 1223   CHOLHDL 3.0 12/12/2011 1223   VLDL 20 12/12/2011 1223   LDLCALC 50 12/12/2011 1223    CBC    Component Value Date/Time   WBC 11.1* 02/06/2012 2036   RBC 4.50 02/06/2012 2036   HGB 14.4 02/06/2012 2036   HCT 42.4 02/06/2012 2036   PLT 368 02/06/2012 2036   MCV 94.2 02/06/2012 2036   MCH 32.0 02/06/2012 2036   MCHC 34.0 02/06/2012 2036   RDW 14.2 02/06/2012 2036   LYMPHSABS 4.4* 11/23/2011 0740   MONOABS 1.4* 11/23/2011 0740   EOSABS 0.1 11/23/2011 0740   BASOSABS 0.0 11/23/2011 0740

## 2012-04-09 NOTE — Assessment & Plan Note (Signed)
I will start Wellbutrin 150 mg twice a day. Hopefully this will help his anxiety as well.

## 2012-04-13 ENCOUNTER — Other Ambulatory Visit: Payer: Self-pay | Admitting: Adult Health

## 2012-06-09 ENCOUNTER — Emergency Department (HOSPITAL_COMMUNITY)
Admission: EM | Admit: 2012-06-09 | Discharge: 2012-06-10 | Disposition: A | Discharge status: Home / Self Care | Payer: Medicaid Other | Attending: Emergency Medicine | Admitting: Emergency Medicine

## 2012-06-09 DIAGNOSIS — S40019A Contusion of unspecified shoulder, initial encounter: Secondary | ICD-10-CM

## 2012-06-09 DIAGNOSIS — F17200 Nicotine dependence, unspecified, uncomplicated: Secondary | ICD-10-CM | POA: Insufficient documentation

## 2012-06-09 DIAGNOSIS — M549 Dorsalgia, unspecified: Secondary | ICD-10-CM | POA: Insufficient documentation

## 2012-06-09 DIAGNOSIS — I252 Old myocardial infarction: Secondary | ICD-10-CM | POA: Insufficient documentation

## 2012-06-09 DIAGNOSIS — M25519 Pain in unspecified shoulder: Secondary | ICD-10-CM | POA: Insufficient documentation

## 2012-06-09 DIAGNOSIS — I2510 Atherosclerotic heart disease of native coronary artery without angina pectoris: Secondary | ICD-10-CM | POA: Insufficient documentation

## 2012-06-09 HISTORY — DX: Unspecified asthma, uncomplicated: J45.909

## 2012-06-09 NOTE — ED Notes (Signed)
Patient complaining of mid back pain radiating to lower back. Also complaining of left elbow, left shoulder, and left shoulder blade pain. Patient reports running in wet grass yesterday, slipping, and falling onto pavement. Patient reports history of back problems.

## 2012-06-10 ENCOUNTER — Emergency Department (HOSPITAL_COMMUNITY): Payer: Medicaid Other

## 2012-06-10 ENCOUNTER — Encounter (HOSPITAL_COMMUNITY): Payer: Self-pay | Admitting: Emergency Medicine

## 2012-06-10 MED ORDER — HYDROCODONE-ACETAMINOPHEN 5-325 MG PO TABS
1.0000 | ORAL_TABLET | Freq: Once | ORAL | Status: AC
  Administered 2012-06-10: 1 via ORAL
  Filled 2012-06-10: qty 1

## 2012-06-10 MED ORDER — HYDROCODONE-ACETAMINOPHEN 5-325 MG PO TABS: 1.0000 | ORAL_TABLET | ORAL | Status: AC | PRN

## 2012-06-10 NOTE — ED Provider Notes (Addendum)
History     CSN: [id number]  Arrival date & time 06/09/12  2322   First MD Initiated Contact with Patient 06/09/12 2339      Chief Complaint  Patient presents with  . Back Pain  . Arm Pain    (Consider location/radiation/quality/duration/timing/severity/associated sxs/prior treatment) HPI  Nathan Waters is a 31 y.o. male who presents to the Emergency Department complaining of right shoulder and arm pain after falling yesterday in the rain. He was running, slipped, slammed into the hood of a car and rolled off onto the ground. He has used tylenol with no relief. Able to move the arm and shoulder with discomfort.  Past Medical History  Diagnosis Date  . Migraine   . STEMI (ST elevation myocardial infarction)   . History of ventricular fibrillation     S/p cardioversion  . CAD (coronary artery disease)     s/p BMS to mid LCx    Past Surgical History  Procedure Date  . Splenectomy   . Back surgery     Family History  Problem Relation Age of Onset  . Malignant hyperthermia Mother   . Malignant hyperthermia Brother   . Heart disease Brother   . Heart disease Father     History  Substance Use Topics  . Smoking status: Current Everyday Smoker -- 2.0 packs/day    Types: Cigarettes  . Smokeless tobacco: Not on file  . Alcohol Use: 1.2 oz/week    2 Cans of beer per week     occasionally      Review of Systems  Constitutional: Negative for fever.       10 Systems reviewed and are negative for acute change except as noted in the HPI.  HENT: Negative for congestion.   Eyes: Negative for discharge and redness.  Respiratory: Negative for cough and shortness of breath.   Cardiovascular: Negative for chest pain.  Gastrointestinal: Negative for vomiting and abdominal pain.  Musculoskeletal: Negative for back pain.       Left shoulder pain  Skin: Negative for rash.  Neurological: Negative for syncope, numbness and headaches.  Psychiatric/Behavioral:       No  behavior change.    Allergies  Penicillins  Home Medications   Current Outpatient Rx  Name Route Sig Dispense Refill  . ALBUTEROL SULFATE HFA 108 (90 BASE) MCG/ACT IN AERS Inhalation Inhale 2 puffs into the lungs every 4 (four) hours as needed for wheezing or shortness of breath. 1 Inhaler 3  . ASPIRIN 81 MG PO TBEC Oral Take 81 mg by mouth every morning.    Marland Kitchen BUPROPION HCL ER (XL) 150 MG PO TB24 Oral Take 1 tablet (150 mg total) by mouth daily. 60 tablet 6  . CLOPIDOGREL BISULFATE 75 MG PO TABS Oral Take 75 mg by mouth every morning.     Marland Kitchen METOPROLOL TARTRATE 25 MG PO TABS Oral Take 25 mg by mouth 2 (two) times daily.     Marland Kitchen NITROGLYCERIN 0.4 MG SL SUBL Sublingual Place 1 tablet (0.4 mg total) under the tongue every 5 (five) minutes as needed for chest pain. 25 tablet 12  . PRAVASTATIN SODIUM 40 MG PO TABS Oral Take 40 mg by mouth every evening.    Marland Kitchen PRAVASTATIN SODIUM 40 MG PO TABS  TAKE 1 TABLET BY MOUTH EVERY DAY TO REPLACE LIPITOR 30 tablet 2    BP 123/73  Pulse 65  Temp 97.7 F (36.5 C) (Oral)  Resp 20  Ht 5\' 11"  (1.803 m)  Wt 270 lb (122.471 kg)  BMI 37.66 kg/m2  SpO2 99%  Physical Exam  Nursing note and vitals reviewed. Constitutional: He appears well-developed and well-nourished.       Awake, alert, nontoxic appearance.  HENT:  Head: Atraumatic.  Eyes: Right eye exhibits no discharge. Left eye exhibits no discharge.  Neck: Neck supple.  Pulmonary/Chest: Effort normal. He exhibits no tenderness.  Abdominal: Soft. There is no tenderness. There is no rebound.  Musculoskeletal: He exhibits no tenderness.       Baseline ROM, no obvious new focal weakness.No bruising, lesions, deformity to left shoulder. FROM with mild discomfort.  Neurological:       Mental status and motor strength appears baseline for patient and situation.  Skin: No rash noted.  Psychiatric: He has a normal mood and affect.    ED Course  Procedures (including critical care time)  Dg Shoulder  Left  06/10/2012  *RADIOLOGY REPORT*  Clinical Data: 31 year old male with pain status post fall.  LEFT SHOULDER - 2+ VIEW  Comparison: Chest radiographs 11/23/2011 and earlier.  Findings: Bone mineralization is within normal limits. No glenohumeral joint dislocation.  Proximal left humerus, left clavicle and left scapula appear intact.  Visualized left ribs and lung parenchyma within normal limits.  IMPRESSION: No acute fracture or dislocation identified about the left shoulder.   Original Report Authenticated By: Harley Hallmark, M.D.      MDM  Patient s/p fall with pain to left shoulder. Xrays negative. Given analgesic with improvement. Pt stable in ED with no significant deterioration in condition.The patient appears reasonably screened and/or stabilized for discharge and I doubt any other medical condition or other [facility] requiring further screening, evaluation, or treatment in the ED at this time prior to discharge.  MDM Reviewed: nursing note and vitals           Nicoletta Dress. Colon Branch, MD 06/10/12 Josefa Half  Nicoletta Dress. Colon Branch, MD 06/10/12 1610

## 2012-06-17 ENCOUNTER — Emergency Department (HOSPITAL_COMMUNITY)
Admission: EM | Admit: 2012-06-17 | Discharge: 2012-06-18 | Disposition: A | Discharge status: Home / Self Care | Payer: Medicaid Other | Attending: Emergency Medicine | Admitting: Emergency Medicine

## 2012-06-17 ENCOUNTER — Encounter (HOSPITAL_COMMUNITY): Payer: Self-pay

## 2012-06-17 DIAGNOSIS — I2510 Atherosclerotic heart disease of native coronary artery without angina pectoris: Secondary | ICD-10-CM | POA: Insufficient documentation

## 2012-06-17 DIAGNOSIS — F17200 Nicotine dependence, unspecified, uncomplicated: Secondary | ICD-10-CM | POA: Insufficient documentation

## 2012-06-17 DIAGNOSIS — T148XXA Other injury of unspecified body region, initial encounter: Secondary | ICD-10-CM

## 2012-06-17 DIAGNOSIS — J45909 Unspecified asthma, uncomplicated: Secondary | ICD-10-CM | POA: Insufficient documentation

## 2012-06-17 DIAGNOSIS — S239XXA Sprain of unspecified parts of thorax, initial encounter: Secondary | ICD-10-CM | POA: Insufficient documentation

## 2012-06-17 DIAGNOSIS — E7800 Pure hypercholesterolemia, unspecified: Secondary | ICD-10-CM | POA: Insufficient documentation

## 2012-06-17 DIAGNOSIS — I252 Old myocardial infarction: Secondary | ICD-10-CM | POA: Insufficient documentation

## 2012-06-17 DIAGNOSIS — Z9861 Coronary angioplasty status: Secondary | ICD-10-CM | POA: Insufficient documentation

## 2012-06-17 DIAGNOSIS — X500XXA Overexertion from strenuous movement or load, initial encounter: Secondary | ICD-10-CM | POA: Insufficient documentation

## 2012-06-17 DIAGNOSIS — S335XXA Sprain of ligaments of lumbar spine, initial encounter: Secondary | ICD-10-CM | POA: Insufficient documentation

## 2012-06-17 DIAGNOSIS — I10 Essential (primary) hypertension: Secondary | ICD-10-CM | POA: Insufficient documentation

## 2012-06-17 MED ORDER — HYDROCODONE-ACETAMINOPHEN 5-325 MG PO TABS
1.0000 | ORAL_TABLET | Freq: Once | ORAL | Status: AC
  Administered 2012-06-18: 1 via ORAL
  Filled 2012-06-17: qty 1

## 2012-06-17 MED ORDER — CYCLOBENZAPRINE HCL 10 MG PO TABS
10.0000 mg | ORAL_TABLET | Freq: Once | ORAL | Status: AC
  Administered 2012-06-18: 10 mg via ORAL
  Filled 2012-06-17: qty 1

## 2012-06-17 MED ORDER — IBUPROFEN 800 MG PO TABS
800.0000 mg | ORAL_TABLET | Freq: Once | ORAL | Status: AC
  Administered 2012-06-18: 800 mg via ORAL
  Filled 2012-06-17: qty 1

## 2012-06-17 NOTE — ED Notes (Signed)
Pt sts tripped over a jack and twisted back. Sts lower back is hurting.  Hx of back surgery in 2011.

## 2012-06-17 NOTE — ED Notes (Signed)
Pt states pain in lower back after tripping over a jack at home. Pt has had sx on L4/L5 area in 2011. Pt states R foot has been going numb also

## 2012-06-17 NOTE — ED Provider Notes (Signed)
History     CSN: [id number]  Arrival date & time 06/17/12  2200   First MD Initiated Contact with Patient 06/17/12 2327      Chief Complaint  Patient presents with  . Back Pain    (Consider location/radiation/quality/duration/timing/severity/associated sxs/prior treatment) HPI Comments: Pr helping a friend work on his car yesterday.  The car was up on a jack.  He was walking and tripped over the jack handle and "wrenched" his back.  He had 2 laminectomies and a fusion in the LS spine 2 years ago.      Patient is a 31 y.o. male presenting with back pain. The history is provided by the patient. No language interpreter was used.  Back Pain  This is a new problem. The current episode started yesterday. The problem occurs constantly. The problem has not changed since onset.The pain is present in the thoracic spine. The pain does not radiate. The pain is severe. The symptoms are aggravated by bending and twisting. Pertinent negatives include no fever, no numbness, no bowel incontinence, no perianal numbness, no bladder incontinence, no leg pain, no paresthesias, no paresis, no tingling and no weakness. He has tried nothing for the symptoms.    Past Medical History  Diagnosis Date  . Migraine   . STEMI (ST elevation myocardial infarction)   . History of ventricular fibrillation     S/p cardioversion  . CAD (coronary artery disease)     s/p BMS to mid LCx  . Hypertension   . Hypercholesteremia   . Asthma     Past Surgical History  Procedure Date  . Splenectomy   . Back surgery   . Coronary stent placement     Family History  Problem Relation Age of Onset  . Malignant hyperthermia Mother   . Malignant hyperthermia Brother   . Heart disease Brother   . Heart disease Father     History  Substance Use Topics  . Smoking status: Current Every Day Smoker -- 0.5 packs/day    Types: Cigarettes  . Smokeless tobacco: Not on file  . Alcohol Use: 0.0 oz/week     occasionally       Review of Systems  Constitutional: Negative for fever and chills.  Gastrointestinal: Negative for bowel incontinence.  Genitourinary: Negative for bladder incontinence.  Musculoskeletal: Positive for back pain.  Neurological: Negative for tingling, weakness, numbness and paresthesias.  All other systems reviewed and are negative.    Allergies  Penicillins  Home Medications   Current Outpatient Rx  Name Route Sig Dispense Refill  . ALBUTEROL SULFATE HFA 108 (90 BASE) MCG/ACT IN AERS Inhalation Inhale 2 puffs into the lungs every 4 (four) hours as needed for wheezing or shortness of breath. 1 Inhaler 3  . ASPIRIN 81 MG PO TBEC Oral Take 81 mg by mouth every morning.    Marland Kitchen BUPROPION HCL ER (XL) 150 MG PO TB24 Oral Take 1 tablet (150 mg total) by mouth daily. 60 tablet 6  . CLOPIDOGREL BISULFATE 75 MG PO TABS Oral Take 75 mg by mouth every morning.     Marland Kitchen METOPROLOL TARTRATE 50 MG PO TABS  TAKE 1 TABLET BY MOUTH TWICE DAILY 60 tablet 2  . METOPROLOL TARTRATE 25 MG PO TABS Oral Take 25 mg by mouth 2 (two) times daily.     Marland Kitchen NITROGLYCERIN 0.4 MG SL SUBL Sublingual Place 1 tablet (0.4 mg total) under the tongue every 5 (five) minutes as needed for chest pain. 25 tablet 12  .  PRAVASTATIN SODIUM 40 MG PO TABS Oral Take 40 mg by mouth every evening.    Marland Kitchen PRAVASTATIN SODIUM 40 MG PO TABS  TAKE 1 TABLET BY MOUTH EVERY DAY TO REPLACE LIPITOR 30 tablet 2    BP 123/72  Pulse 79  Temp 97.9 F (36.6 C)  Resp 20  Ht 5\' 11"  (1.803 m)  Wt 270 lb (122.471 kg)  BMI 37.66 kg/m2  SpO2 97%  Physical Exam  Nursing note and vitals reviewed. Constitutional: He is oriented to person, place, and time. He appears well-developed and well-nourished.  HENT:  Head: Normocephalic and atraumatic.  Eyes: EOM are normal.  Neck: Normal range of motion.  Cardiovascular: Normal rate, regular rhythm, normal heart sounds and intact distal pulses.   Pulmonary/Chest: Effort normal and breath sounds normal.  No respiratory distress.  Abdominal: Soft. He exhibits no distension. There is no tenderness.  Musculoskeletal:       Lumbar back: He exhibits decreased range of motion and tenderness. He exhibits no bony tenderness, no swelling, no edema, no deformity, no laceration, no pain, no spasm and normal pulse.       Back:  Neurological: He is alert and oriented to person, place, and time.  Skin: Skin is warm and dry.  Psychiatric: He has a normal mood and affect. Judgment normal.    ED Course  Procedures (including critical care time)  Labs Reviewed - No data to display No results found.   1. Muscle strain       MDM  rx-flexeril F/u with PCP        Evalina Field, PA 07/05/12 1331

## 2012-06-18 MED ORDER — CYCLOBENZAPRINE HCL 10 MG PO TABS: 10.0000 mg | ORAL_TABLET | Freq: Three times a day (TID) | ORAL | Status: AC | PRN

## 2012-06-18 MED ORDER — HYDROCODONE-ACETAMINOPHEN 5-325 MG PO TABS: 12.0000 | ORAL_TABLET | Freq: Four times a day (QID) | ORAL | Status: AC | PRN

## 2012-06-18 NOTE — ED Notes (Signed)
Pt stable at discharge pt instructed not to drive while on pain medication also instructed not to take extra tylenol with pain meds

## 2012-06-19 ENCOUNTER — Other Ambulatory Visit (HOSPITAL_COMMUNITY): Payer: Self-pay | Admitting: Physician Assistant

## 2012-07-06 NOTE — ED Provider Notes (Signed)
Medical screening examination/treatment/procedure(s) were performed by non-physician practitioner and as supervising physician I was immediately available for consultation/collaboration.    Vida Roller, MD 07/06/12 [phone]

## 2012-07-26 ENCOUNTER — Other Ambulatory Visit: Payer: Self-pay | Admitting: Cardiology

## 2012-07-27 NOTE — Telephone Encounter (Signed)
Fax Received. Refill Completed. Nathan Waters (R.M.A)   

## 2012-11-15 ENCOUNTER — Encounter (HOSPITAL_COMMUNITY): Payer: Self-pay | Admitting: *Deleted

## 2012-11-15 ENCOUNTER — Emergency Department (HOSPITAL_COMMUNITY)
Admission: EM | Admit: 2012-11-15 | Discharge: 2012-11-16 | Disposition: A | Discharge status: Home / Self Care | Payer: Medicaid Other | Attending: Emergency Medicine | Admitting: Emergency Medicine

## 2012-11-15 DIAGNOSIS — R000 Tachycardia, unspecified: Secondary | ICD-10-CM | POA: Insufficient documentation

## 2012-11-15 DIAGNOSIS — R5381 Other malaise: Secondary | ICD-10-CM | POA: Insufficient documentation

## 2012-11-15 DIAGNOSIS — I2510 Atherosclerotic heart disease of native coronary artery without angina pectoris: Secondary | ICD-10-CM | POA: Insufficient documentation

## 2012-11-15 DIAGNOSIS — R5383 Other fatigue: Secondary | ICD-10-CM | POA: Insufficient documentation

## 2012-11-15 DIAGNOSIS — R51 Headache: Secondary | ICD-10-CM | POA: Insufficient documentation

## 2012-11-15 DIAGNOSIS — F430 Acute stress reaction: Secondary | ICD-10-CM | POA: Insufficient documentation

## 2012-11-15 DIAGNOSIS — Z79899 Other long term (current) drug therapy: Secondary | ICD-10-CM | POA: Insufficient documentation

## 2012-11-15 DIAGNOSIS — R52 Pain, unspecified: Secondary | ICD-10-CM | POA: Insufficient documentation

## 2012-11-15 DIAGNOSIS — Z9861 Coronary angioplasty status: Secondary | ICD-10-CM | POA: Insufficient documentation

## 2012-11-15 DIAGNOSIS — Z9089 Acquired absence of other organs: Secondary | ICD-10-CM | POA: Insufficient documentation

## 2012-11-15 DIAGNOSIS — E7800 Pure hypercholesterolemia, unspecified: Secondary | ICD-10-CM | POA: Insufficient documentation

## 2012-11-15 DIAGNOSIS — I252 Old myocardial infarction: Secondary | ICD-10-CM | POA: Insufficient documentation

## 2012-11-15 DIAGNOSIS — J45909 Unspecified asthma, uncomplicated: Secondary | ICD-10-CM | POA: Insufficient documentation

## 2012-11-15 DIAGNOSIS — Z8679 Personal history of other diseases of the circulatory system: Secondary | ICD-10-CM | POA: Insufficient documentation

## 2012-11-15 DIAGNOSIS — K529 Noninfective gastroenteritis and colitis, unspecified: Secondary | ICD-10-CM

## 2012-11-15 DIAGNOSIS — I10 Essential (primary) hypertension: Secondary | ICD-10-CM | POA: Insufficient documentation

## 2012-11-15 DIAGNOSIS — R110 Nausea: Secondary | ICD-10-CM | POA: Insufficient documentation

## 2012-11-15 DIAGNOSIS — R630 Anorexia: Secondary | ICD-10-CM | POA: Insufficient documentation

## 2012-11-15 DIAGNOSIS — F17200 Nicotine dependence, unspecified, uncomplicated: Secondary | ICD-10-CM | POA: Insufficient documentation

## 2012-11-15 DIAGNOSIS — Z7982 Long term (current) use of aspirin: Secondary | ICD-10-CM | POA: Insufficient documentation

## 2012-11-15 DIAGNOSIS — Z7902 Long term (current) use of antithrombotics/antiplatelets: Secondary | ICD-10-CM | POA: Insufficient documentation

## 2012-11-15 DIAGNOSIS — K5289 Other specified noninfective gastroenteritis and colitis: Secondary | ICD-10-CM | POA: Insufficient documentation

## 2012-11-15 MED ORDER — SODIUM CHLORIDE 0.9 % IV SOLN
1000.0000 mL | Freq: Once | INTRAVENOUS | Status: AC
  Administered 2012-11-15: 1000 mL via INTRAVENOUS

## 2012-11-15 MED ORDER — ONDANSETRON HCL 4 MG/2ML IJ SOLN
4.0000 mg | Freq: Once | INTRAMUSCULAR | Status: AC
  Administered 2012-11-15: 4 mg via INTRAVENOUS
  Filled 2012-11-15: qty 2

## 2012-11-15 MED ORDER — SODIUM CHLORIDE 0.9 % IV SOLN: 1000.0000 mL | INTRAVENOUS | Status: DC

## 2012-11-15 NOTE — ED Notes (Signed)
Body aches, fever, nausea and vomiting

## 2012-11-15 NOTE — ED Provider Notes (Signed)
History     CSN: [id number]  Arrival date & time 11/15/12  2222   First MD Initiated Contact with Patient 11/15/12 2240      Chief Complaint  Patient presents with  . Influenza    (Consider location/radiation/quality/duration/timing/severity/associated sxs/prior treatment) Patient is a 32 y.o. male presenting with flu symptoms. The history is provided by the patient.  Influenza This is a new problem. The current episode started today. The problem occurs constantly. The problem has been gradually worsening. Associated symptoms include anorexia, arthralgias, a change in bowel habit, chills, fatigue, a fever, headaches, myalgias, nausea and weakness. Pertinent negatives include no abdominal pain, chest pain, coughing, neck pain or vomiting. Nothing aggravates the symptoms. He has tried nothing for the symptoms. The treatment provided no relief.    Past Medical History  Diagnosis Date  . Migraine   . STEMI (ST elevation myocardial infarction)   . History of ventricular fibrillation     S/p cardioversion  . CAD (coronary artery disease)     s/p BMS to mid LCx  . Hypertension   . Hypercholesteremia   . Asthma     Past Surgical History  Procedure Date  . Splenectomy   . Back surgery   . Coronary stent placement     Family History  Problem Relation Age of Onset  . Malignant hyperthermia Mother   . Malignant hyperthermia Brother   . Heart disease Brother   . Heart disease Father     History  Substance Use Topics  . Smoking status: Current Every Day Smoker -- 0.5 packs/day    Types: Cigarettes  . Smokeless tobacco: Not on file  . Alcohol Use: 0.0 oz/week     Comment: occasionally      Review of Systems  Constitutional: Positive for fever, chills and fatigue. Negative for activity change.       All ROS Neg except as noted in HPI  HENT: Negative for nosebleeds and neck pain.   Eyes: Negative for photophobia and discharge.  Respiratory: Negative for cough, shortness  of breath and wheezing.   Cardiovascular: Negative for chest pain and palpitations.  Gastrointestinal: Positive for nausea, anorexia and change in bowel habit. Negative for vomiting, abdominal pain and blood in stool.  Genitourinary: Negative for dysuria, frequency and hematuria.  Musculoskeletal: Positive for myalgias and arthralgias. Negative for back pain.  Skin: Negative.   Neurological: Positive for weakness and headaches. Negative for dizziness, seizures and speech difficulty.  Psychiatric/Behavioral: Negative for hallucinations and confusion.    Allergies  Penicillins  Home Medications   Current Outpatient Rx  Name  Route  Sig  Dispense  Refill  . ASPIRIN 81 MG PO TBEC   Oral   Take 81 mg by mouth every morning.          Marland Kitchen CLOPIDOGREL BISULFATE 75 MG PO TABS   Oral   Take 75 mg by mouth every morning.          . IBUPROFEN 200 MG PO TABS   Oral   Take 600 mg by mouth once as needed.         Marland Kitchen METOPROLOL TARTRATE 50 MG PO TABS   Oral   Take 50 mg by mouth 2 (two) times daily.         Marland Kitchen NITROGLYCERIN 0.4 MG SL SUBL   Sublingual   Place 1 tablet (0.4 mg total) under the tongue every 5 (five) minutes as needed for chest pain.   25 tablet  12   . PERMETHRIN 5 % EX CREA   Topical   Apply topically once.         Marland Kitchen PRAVASTATIN SODIUM 40 MG PO TABS   Oral   Take 40 mg by mouth daily.         . ALBUTEROL SULFATE HFA 108 (90 BASE) MCG/ACT IN AERS   Inhalation   Inhale 2 puffs into the lungs every 4 (four) hours as needed for wheezing or shortness of breath.   1 Inhaler   3     BP 114/79  Pulse 124  Temp 99.4 F (37.4 C)  Resp 20  Ht 5\' 11"  (1.803 m)  Wt 266 lb (120.657 kg)  BMI 37.10 kg/m2  SpO2 98%  Physical Exam  Nursing note and vitals reviewed. Constitutional: He is oriented to person, place, and time. He appears well-developed and well-nourished.  Non-toxic appearance.  HENT:  Head: Normocephalic.  Right Ear: Tympanic membrane and  external ear normal.  Left Ear: Tympanic membrane and external ear normal.  Eyes: EOM and lids are normal. Pupils are equal, round, and reactive to light.  Neck: Normal range of motion. Neck supple. Carotid bruit is not present.  Cardiovascular: Normal heart sounds, intact distal pulses and normal pulses.  Tachycardia present.   Pulmonary/Chest: Breath sounds normal. No respiratory distress.  Abdominal: Soft. Bowel sounds are normal. There is no tenderness. There is no guarding.       Diffuse soreness.  Musculoskeletal: Normal range of motion.  Lymphadenopathy:       Head (right side): No submandibular adenopathy present.       Head (left side): No submandibular adenopathy present.    He has no cervical adenopathy.  Neurological: He is alert and oriented to person, place, and time. He has normal strength. No cranial nerve deficit or sensory deficit.  Skin: Skin is warm and dry.  Psychiatric: He has a normal mood and affect. His speech is normal.    ED Course  Procedures (including critical care time)   Labs Reviewed  BASIC METABOLIC PANEL   No results found.   No diagnosis found.    MDM  I have reviewed nursing notes, vital signs, and all appropriate lab and imaging results for this patient. No nausea, vomiting, or diarrhea in the emergency department. Patient tolerated IV fluids and IV Zofran without problem. Suspect patient has a viral gastroenteritis. Will treat with promethazine every 6 hours. Patient asked to increase fluids. To use Tylenol for soreness or fever. Patient to return if not progressing.       Kathie Dike, [place] 11/16/12 1606

## 2012-11-16 LAB — BASIC METABOLIC PANEL
BUN: 10 mg/dL (ref 6–23)
Chloride: 101 mEq/L (ref 96–112)
Creatinine, Ser: 0.95 mg/dL (ref 0.50–1.35)
GFR calc Af Amer: >90 mL/min (ref >90)
GFR calc non Af Amer: >90 mL/min (ref >90)
Glucose, Bld: 122 mg/dL — ABNORMAL HIGH (ref 70–99)

## 2012-11-16 MED ORDER — PROMETHAZINE HCL 25 MG PO TABS: ORAL_TABLET | ORAL | Status: DC

## 2012-11-16 NOTE — Discharge Instructions (Signed)
Please increase fluids. Wash hands frequently with soap and waterViral Gastroenteritis Viral gastroenteritis is also known as stomach flu. This condition affects the stomach and intestinal tract. It can cause sudden diarrhea and vomiting. The illness typically lasts 3 to 8 days. Most people develop an immune response that eventually gets rid of the virus. While this natural response develops, the virus can make you quite ill. CAUSES  Many different viruses can cause gastroenteritis, such as rotavirus or noroviruses. You can catch one of these viruses by consuming contaminated food or water. You may also catch a virus by sharing utensils or other personal items with an infected person or by touching a contaminated surface. SYMPTOMS  The most common symptoms are diarrhea and vomiting. These problems can cause a severe loss of body fluids (dehydration) and a body salt (electrolyte) imbalance. Other symptoms may include:  Fever.  Headache.  Fatigue.  Abdominal pain. DIAGNOSIS  Your caregiver can usually diagnose viral gastroenteritis based on your symptoms and a physical exam. A stool sample may also be taken to test for the presence of viruses or other infections. TREATMENT  This illness typically goes away on its own. Treatments are aimed at rehydration. The most serious cases of viral gastroenteritis involve vomiting so severely that you are not able to keep fluids down. In these cases, fluids must be given through an intravenous line (IV). HOME CARE INSTRUCTIONS   Drink enough fluids to keep your urine clear or pale yellow. Drink small amounts of fluids frequently and increase the amounts as tolerated.  Ask your caregiver for specific rehydration instructions.  Avoid:  Foods high in sugar.  Alcohol.  Carbonated drinks.  Tobacco.  Juice.  Caffeine drinks.  Extremely hot or cold fluids.  Fatty, greasy foods.  Too much intake of anything at one time.  Dairy products until 24  to 48 hours after diarrhea stops.  You may consume probiotics. Probiotics are active cultures of beneficial bacteria. They may lessen the amount and number of diarrheal stools in adults. Probiotics can be found in yogurt with active cultures and in supplements.  Wash your hands well to avoid spreading the virus.  Only take over-the-counter or prescription medicines for pain, discomfort, or fever as directed by your caregiver. Do not give aspirin to children. Antidiarrheal medicines are not recommended.  Ask your caregiver if you should continue to take your regular prescribed and over-the-counter medicines.  Keep all follow-up appointments as directed by your caregiver. SEEK IMMEDIATE MEDICAL CARE IF:   You are unable to keep fluids down.  You do not urinate at least once every 6 to 8 hours.  You develop shortness of breath.  You notice blood in your stool or vomit. This may look like coffee grounds.  You have abdominal pain that increases or is concentrated in one small area (localized).  You have persistent vomiting or diarrhea.  You have a fever.  The patient is a child younger than 3 months, and he or she has a fever.  The patient is a child older than 3 months, and he or she has a fever and persistent symptoms.  The patient is a child older than 3 months, and he or she has a fever and symptoms suddenly get worse.  The patient is a baby, and he or she has no tears when crying. MAKE SURE YOU:   Understand these instructions.  Will watch your condition.  Will get help right away if you are not doing well or get worse.  Document Released: 10/03/2005 Document Revised: 12/26/2011 Document Reviewed: 07/20/2011 [facility] Patient Information 2013 [place], [place]. Clear Liquid Diet The clear liquid dietconsists of foods that are liquid or will become liquid at room temperature.You should be able to see through the liquid and beverages. Examples of foods allowed on a clear  liquid diet include fruit juice, broth or bouillon, gelatin, or frozen ice pops. The purpose of this diet is to provide necessary fluid, electrolytes such as sodium and potassium, and energy to keep the body functioning during times when you are not able to consume a regular diet.A clear liquid diet should not be continued for long periods of time as it is not nutritionally adequate.  REASONS FOR USING A CLEAR LIQUID DIET  In sudden onset (acute) conditions for a patient before or after surgery.  As the first step in oral feeding.  For fluid and electrolyte replacement in diarrheal diseases.  As a diet before certain medical tests are performed. ADEQUACY The clear liquid diet is adequate only in ascorbic acid, according to the Recommended Dietary Allowances of the [employer]. CHOOSING FOODS Breads and Starches  Allowed:  None are allowed.  Avoid: All are avoided. Vegetables  Allowed:  Strained tomato or vegetable juice.  Avoid: Any others. Fruit  Allowed:  Strained fruit juices and fruit drinks. Include 1 serving of citrus or vitamin C-enriched fruit juice daily.  Avoid: Any others. Meat and Meat Substitutes  Allowed:  None are allowed.  Avoid: All are avoided. Milk  Allowed:  None are allowed.  Avoid: All are avoided. Soups and Combination Foods  Allowed:  Clear bouillon, broth, or strained broth-based soups.  Avoid: Any others. Desserts and Sweets  Allowed:  Sugar, honey. High protein gelatin. Flavored gelatin, ices, or frozen ice pops that do not contain milk.  Avoid: Any others. Fats and Oils  Allowed:  None are allowed.  Avoid: All are avoided. Beverages  Allowed: Cereal beverages, coffee (regular or decaffeinated), tea, or soda at the discretion of your caregiver.  Avoid: Any others. Condiments  Allowed:  Iodized salt.  Avoid: Any others, including pepper. Supplements  Allowed:  Liquid nutrition beverages.  Avoid: Any others  that contain lactose or fiber. SAMPLE MEAL PLAN Breakfast  4 oz (120 mL) strained orange juice.   to 1 cup (125 to 250 mL) gelatin (plain or fortified).  1 cup (250 mL) beverage (coffee or tea).  Sugar, if desired. Midmorning Snack   cup (125 mL) gelatin (plain or fortified). Lunch  1 cup (250 mL) broth or consomm.  4 oz (120 mL) strained grapefruit juice.   cup (125 mL) gelatin (plain or fortified).  1 cup (250 mL) beverage (coffee or tea).  Sugar, if desired. Midafternoon Snack   cup (125 mL) fruit ice.   cup (125 mL) strained fruit juice. Dinner  1 cup (250 mL) broth or consomm.   cup (125 mL) cranberry juice.   cup (125 mL) flavored gelatin (plain or fortified).  1 cup (250 mL) beverage (coffee or tea).  Sugar, if desired. Evening Snack  4 oz (120 mL) strained apple juice (vitamin C-fortified).   cup (125 mL) flavored gelatin (plain or fortified). Document Released: 10/03/2005 Document Revised: 12/26/2011 Document Reviewed: 12/31/2010 [facility] Patient Information 2013 [place], [place]. . Do not allow any one to eat or drink from your utensils. Use clear liquids for the next 24 hours, then gradually increase diet.

## 2012-11-16 NOTE — ED Provider Notes (Signed)
Medical screening examination/treatment/procedure(s) were performed by non-physician practitioner and as supervising physician I was immediately available for consultation/collaboration.   Danilyn Cocke B. Bernette Mayers, MD 11/16/12 [phone]

## 2012-11-26 ENCOUNTER — Emergency Department (HOSPITAL_COMMUNITY)
Admission: EM | Admit: 2012-11-26 | Discharge: 2012-11-26 | Disposition: A | Discharge status: Home / Self Care | Payer: Medicaid Other | Attending: Emergency Medicine | Admitting: Emergency Medicine

## 2012-11-26 ENCOUNTER — Other Ambulatory Visit (HOSPITAL_COMMUNITY): Payer: Self-pay | Admitting: Physician Assistant

## 2012-11-26 ENCOUNTER — Encounter (HOSPITAL_COMMUNITY): Payer: Self-pay | Admitting: *Deleted

## 2012-11-26 DIAGNOSIS — Z8679 Personal history of other diseases of the circulatory system: Secondary | ICD-10-CM | POA: Insufficient documentation

## 2012-11-26 DIAGNOSIS — M25551 Pain in right hip: Secondary | ICD-10-CM

## 2012-11-26 DIAGNOSIS — F17200 Nicotine dependence, unspecified, uncomplicated: Secondary | ICD-10-CM | POA: Insufficient documentation

## 2012-11-26 DIAGNOSIS — M5430 Sciatica, unspecified side: Secondary | ICD-10-CM

## 2012-11-26 DIAGNOSIS — I252 Old myocardial infarction: Secondary | ICD-10-CM | POA: Insufficient documentation

## 2012-11-26 DIAGNOSIS — M25559 Pain in unspecified hip: Secondary | ICD-10-CM | POA: Insufficient documentation

## 2012-11-26 DIAGNOSIS — E7800 Pure hypercholesterolemia, unspecified: Secondary | ICD-10-CM | POA: Insufficient documentation

## 2012-11-26 DIAGNOSIS — J45909 Unspecified asthma, uncomplicated: Secondary | ICD-10-CM | POA: Insufficient documentation

## 2012-11-26 DIAGNOSIS — Z79899 Other long term (current) drug therapy: Secondary | ICD-10-CM | POA: Insufficient documentation

## 2012-11-26 DIAGNOSIS — I10 Essential (primary) hypertension: Secondary | ICD-10-CM | POA: Insufficient documentation

## 2012-11-26 DIAGNOSIS — I2510 Atherosclerotic heart disease of native coronary artery without angina pectoris: Secondary | ICD-10-CM | POA: Insufficient documentation

## 2012-11-26 MED ORDER — IBUPROFEN 800 MG PO TABS: 800.0000 mg | ORAL_TABLET | Freq: Three times a day (TID) | ORAL | Status: DC | PRN

## 2012-11-26 MED ORDER — IBUPROFEN 800 MG PO TABS
800.0000 mg | ORAL_TABLET | Freq: Once | ORAL | Status: AC
  Administered 2012-11-26: 800 mg via ORAL
  Filled 2012-11-26: qty 1

## 2012-11-26 MED ORDER — CYCLOBENZAPRINE HCL 10 MG PO TABS
10.0000 mg | ORAL_TABLET | Freq: Once | ORAL | Status: AC
  Administered 2012-11-26: 10 mg via ORAL
  Filled 2012-11-26: qty 1

## 2012-11-26 MED ORDER — CYCLOBENZAPRINE HCL 10 MG PO TABS: 10.0000 mg | ORAL_TABLET | Freq: Two times a day (BID) | ORAL | Status: DC | PRN

## 2012-11-26 NOTE — ED Notes (Signed)
Pain low back , rt hip and rt leg. Onset this am, no know injury.

## 2012-11-26 NOTE — ED Provider Notes (Signed)
History     CSN: [id number]  Arrival date & time 11/26/12  1800   None     Chief Complaint  Patient presents with  . Hip Pain    (Consider location/radiation/quality/duration/timing/severity/associated sxs/prior treatment) HPI Comments: R hip pain began when he woke up. No known trauma.   Patient is a 32 y.o. male presenting with hip pain. The history is provided by the patient.  Hip Pain This is a new problem. The current episode started today. The problem occurs constantly. The problem has been unchanged. Pertinent negatives include no abdominal pain, chills, coughing, fever or vomiting. The symptoms are aggravated by bending and walking. He has tried nothing for the symptoms.    Past Medical History  Diagnosis Date  . Migraine   . STEMI (ST elevation myocardial infarction)   . History of ventricular fibrillation     S/p cardioversion  . CAD (coronary artery disease)     s/p BMS to mid LCx  . Hypertension   . Hypercholesteremia   . Asthma     Past Surgical History  Procedure Laterality Date  . Splenectomy    . Back surgery    . Coronary stent placement      Family History  Problem Relation Age of Onset  . Malignant hyperthermia Mother   . Malignant hyperthermia Brother   . Heart disease Brother   . Heart disease Father     History  Substance Use Topics  . Smoking status: Current Every Day Smoker -- 0.50 packs/day    Types: Cigarettes  . Smokeless tobacco: Not on file  . Alcohol Use: 0.0 oz/week     Comment: occasionally      Review of Systems  Constitutional: Negative for fever and chills.  Respiratory: Negative for cough and shortness of breath.   Gastrointestinal: Negative for vomiting and abdominal pain.  All other systems reviewed and are negative.    Allergies  Penicillins  Home Medications   Current Outpatient Rx  Name  Route  Sig  Dispense  Refill  . EXPIRED: albuterol (PROVENTIL HFA;VENTOLIN HFA) 108 (90 BASE) MCG/ACT inhaler  Inhalation   Inhale 2 puffs into the lungs every 4 (four) hours as needed for wheezing or shortness of breath.   1 Inhaler   3   . clopidogrel (PLAVIX) 75 MG tablet   Oral   Take 75 mg by mouth every morning.          Marland Kitchen ibuprofen (ADVIL,MOTRIN) 200 MG tablet   Oral   Take 600 mg by mouth once as needed.         . metoprolol (LOPRESSOR) 50 MG tablet   Oral   Take 50 mg by mouth 2 (two) times daily.         Marland Kitchen EXPIRED: nitroGLYCERIN (NITROSTAT) 0.4 MG SL tablet   Sublingual   Place 1 tablet (0.4 mg total) under the tongue every 5 (five) minutes as needed for chest pain.   25 tablet   12   . permethrin (ELIMITE) 5 % cream   Topical   Apply topically once.         . pravastatin (PRAVACHOL) 40 MG tablet   Oral   Take 40 mg by mouth daily.         . promethazine (PHENERGAN) 25 MG tablet      1/2 to 1 po q6h prn nausea   15 tablet   0     BP 125/78  Pulse 106  Temp(Src) 98.2 F (  36.8 C) (Oral)  Resp 18  Ht 5\' 11"  (1.803 m)  Wt 267 lb (121.11 kg)  BMI 37.26 kg/m2  SpO2 98%  Physical Exam  Nursing note and vitals reviewed. Constitutional: He is oriented to person, place, and time. He appears well-developed and well-nourished. No distress.  HENT:  Head: Normocephalic and atraumatic.  Mouth/Throat: No oropharyngeal exudate.  Eyes: EOM are normal. Pupils are equal, round, and reactive to light.  Neck: Normal range of motion. Neck supple.  Cardiovascular: Normal rate and regular rhythm.  Exam reveals no friction rub.   No murmur heard. Pulmonary/Chest: Effort normal and breath sounds normal. No respiratory distress. He has no wheezes. He has no rales.  Abdominal: He exhibits no distension. There is no tenderness. There is no rebound.  Musculoskeletal: Normal range of motion. He exhibits no edema.       Right hip: He exhibits decreased strength (secondary to lower back pain). He exhibits no tenderness and no bony tenderness.       Lumbar back: He exhibits  tenderness (R lower back) and bony tenderness (lower back over surgical scar). He exhibits no spasm and normal pulse.  Neurological: He is alert and oriented to person, place, and time.  Skin: He is not diaphoretic.    ED Course  Procedures (including critical care time)  Labs Reviewed - No data to display No results found.   1. Right hip pain   2. Sciatica       MDM   32 year old male with history of coronary artery disease who presents with right-sided hip pain. Pain radiates down the back of the right leg to the knee. He has no known trauma. He states he woke up today 11 AM and started having this pain. Had a normal night last night. Patient states it feels like an aching that shoots down his leg. He denies any bowel or bladder incontinence. He denies any fevers. On exam, he is afebrile. He has right lower back tenderness. I did not appreciate any muscle spasm. He has mild strength reduction secondary to pain. He has normal range of motion with passive movements. He has normal sensation. He has no clonus. I feel his exam is consistent with sciatica pain. I will give Flexeril & Motrin. No need for emergent imaging.       Elwin Mocha, MD 11/27/12 [phone]

## 2012-11-26 NOTE — ED Notes (Signed)
Discharge instructions given and reviewed with patient.  Prescriptions given for Flexeril and Motrin; effects and use explained.  Patient verbalized understanding of sedating effects of Flexeril and to take Motrin with food.  Patient ambulatory with steady gait; discharged home in good condition.  Significant other accompanied discharge to drive home.

## 2012-11-27 NOTE — ED Provider Notes (Signed)
I saw and evaluated the patient, reviewed the resident's note and I agree with the findings and plan.  Patient with back pain consistent with sciatica. Treated acutely, followup with his neurosurgeon.  Gilda Crease, MD 11/27/12 [phone]

## 2012-12-11 ENCOUNTER — Ambulatory Visit (INDEPENDENT_AMBULATORY_CARE_PROVIDER_SITE_OTHER): Payer: Medicaid Other | Admitting: Cardiology

## 2012-12-11 ENCOUNTER — Encounter: Payer: Self-pay | Admitting: Cardiology

## 2012-12-11 VITALS — BP 122/82 | HR 69 | Ht 70.0 in | Wt 268.0 lb

## 2012-12-11 DIAGNOSIS — Z720 Tobacco use: Secondary | ICD-10-CM

## 2012-12-11 DIAGNOSIS — I2510 Atherosclerotic heart disease of native coronary artery without angina pectoris: Secondary | ICD-10-CM

## 2012-12-11 DIAGNOSIS — E782 Mixed hyperlipidemia: Secondary | ICD-10-CM

## 2012-12-11 DIAGNOSIS — F17200 Nicotine dependence, unspecified, uncomplicated: Secondary | ICD-10-CM

## 2012-12-11 DIAGNOSIS — I252 Old myocardial infarction: Secondary | ICD-10-CM

## 2012-12-11 NOTE — Progress Notes (Signed)
HPI Nathan Waters returns today for evaluation and management his history of coronary artery disease and history of a STEMI.  He denies any chest pain or angina. He is compliant with his medications but as due  blood work. He continues to smoke about a half-pack cigarettes a day however. He needs renewal on his nitroglycerin. He has not lost any weight.  Past Medical History  Diagnosis Date  . Migraine   . STEMI (ST elevation myocardial infarction)   . History of ventricular fibrillation     S/p cardioversion  . CAD (coronary artery disease)     s/p BMS to mid LCx  . Hypertension   . Hypercholesteremia   . Asthma     Current Outpatient Prescriptions  Medication Sig Dispense Refill  . clopidogrel (PLAVIX) 75 MG tablet Take 75 mg by mouth every morning.       . cyclobenzaprine (FLEXERIL) 10 MG tablet Take 1 tablet (10 mg total) by mouth 2 (two) times daily as needed for muscle spasms.  20 tablet  0  . ibuprofen (ADVIL,MOTRIN) 800 MG tablet Take 1 tablet (800 mg total) by mouth every 8 (eight) hours as needed for pain.  30 tablet  0  . metoprolol (LOPRESSOR) 50 MG tablet Take 50 mg by mouth 2 (two) times daily.      Marland Kitchen NITROSTAT 0.4 MG SL tablet PLACE 1 TABLET UNDER TONGUE EVERY 5 MINUTES AS NEEDED FOR CHEST PAIN  25 tablet  3  . permethrin (ELIMITE) 5 % cream Apply topically once.      . pravastatin (PRAVACHOL) 40 MG tablet Take 40 mg by mouth daily.      Marland Kitchen albuterol (PROVENTIL HFA;VENTOLIN HFA) 108 (90 BASE) MCG/ACT inhaler Inhale 2 puffs into the lungs every 4 (four) hours as needed for wheezing or shortness of breath.  1 Inhaler  3   No current facility-administered medications for this visit.    Allergies  Allergen Reactions  . Penicillins Anaphylaxis    Family History  Problem Relation Age of Onset  . Malignant hyperthermia Mother   . Malignant hyperthermia Brother   . Heart disease Brother   . Heart disease Father     History   Social History  . Marital Status: Divorced     Spouse Name: N/A    Number of Children: N/A  . Years of Education: N/A   Occupational History  . Not on file.   Social History Main Topics  . Smoking status: Current Every Day Smoker -- 0.50 packs/day    Types: Cigarettes  . Smokeless tobacco: Not on file  . Alcohol Use: 0.0 oz/week     Comment: occasionally  . Drug Use: No  . Sexually Active: Yes    Birth Control/ Protection: Rhythm   Other Topics Concern  . Not on file   Social History Narrative  . No narrative on file    ROS ALL NEGATIVE EXCEPT THOSE NOTED IN HPI  PE  General Appearance: well developed, well nourished in no acute distress, morbidly obese, disheveled HEENT: symmetrical face, PERRLA,   Neck: no JVD, thyromegaly, or adenopathy, trachea midline Chest: symmetric without deformity Cardiac: PMI non-displaced, RRR, normal S1, S2, no gallop or murmur Lung: clear to ausculation and percussion Vascular: all pulses full without bruits  Abdominal: nondistended, nontender, good bowel sounds, no HSM, no bruits Extremities: no cyanosis, clubbing or edema, no sign of DVT, no varicosities  Skin: normal color, no rashes Neuro: alert and oriented x 3, non-focal Pysch: normal  affect  EKG Sinus bradycardia with sinus arrhythmia, otherwise normal EKG  BMET    Component Value Date/Time   NA 134* 11/15/2012 2257   K 3.4* 11/15/2012 2257   CL 101 11/15/2012 2257   CO2 21 11/15/2012 2257   GLUCOSE 122* 11/15/2012 2257   BUN 10 11/15/2012 2257   CREATININE 0.95 11/15/2012 2257   CALCIUM 9.1 11/15/2012 2257   GFRNONAA >90 11/15/2012 2257   GFRAA >90 11/15/2012 2257    Lipid Panel     Component Value Date/Time   CHOL 105 12/12/2011 1223   TRIG 99 12/12/2011 1223   HDL 35* 12/12/2011 1223   CHOLHDL 3.0 12/12/2011 1223   VLDL 20 12/12/2011 1223   LDLCALC 50 12/12/2011 1223    CBC    Component Value Date/Time   WBC 11.1* 02/06/2012 2036   RBC 4.50 02/06/2012 2036   HGB 14.4 02/06/2012 2036   HCT 42.4 02/06/2012 2036    PLT 368 02/06/2012 2036   MCV 94.2 02/06/2012 2036   MCH 32.0 02/06/2012 2036   MCHC 34.0 02/06/2012 2036   RDW 14.2 02/06/2012 2036   LYMPHSABS 4.4* 11/23/2011 0740   MONOABS 1.4* 11/23/2011 0740   EOSABS 0.1 11/23/2011 0740   BASOSABS 0.0 11/23/2011 0740

## 2012-12-11 NOTE — Assessment & Plan Note (Signed)
Stable. Continue secondary preventative therapy. We'll obtain fasting lipids and a compress metabolic profile. Encouraged to lose weight and stop smoking. Return the office in one year.

## 2012-12-11 NOTE — Patient Instructions (Signed)
Your physician wants you to follow-up in: 1 year in [place].  You will receive a reminder letter in the mail two months in advance. If you don't receive a letter, please call our office to schedule the follow-up appointment.   Have Fasting Labs at [facility]:  CMP and Lipid Panel.

## 2012-12-17 ENCOUNTER — Encounter: Payer: Self-pay | Admitting: *Deleted

## 2013-01-14 ENCOUNTER — Telehealth: Payer: Self-pay | Admitting: Cardiology

## 2013-01-14 NOTE — Telephone Encounter (Signed)
Pt states that he needs a new rx called in for the medication that we changed the dose on. He doesn't know the name of it. States that pharmacy faxed [place] last week about it.

## 2013-01-14 NOTE — Telephone Encounter (Signed)
Reviewed chart to find OV notations on 01-06-12 for last medication change noted as follows:  Doing well. His blood pressure and his heart rates are low. We'll decrease his Lopressor to 25 mg twice a day. We'll hold his dose this evening. Followup with me in 3 months.per MD Wall however unable to locate any new medication changes in pt chart at recent OV nor lab/test results  Left message with pt family member to call office

## 2013-01-15 MED ORDER — METOPROLOL TARTRATE 25 MG PO TABS: 25.0000 mg | ORAL_TABLET | Freq: Two times a day (BID) | ORAL | Status: DC

## 2013-01-15 NOTE — Telephone Encounter (Signed)
Pt advised that he is still taking the lopressor 25mg  BID, however noted last OV as 50mg  BID, pt stated he has been taking the 25mg  since the last change from MD Wall noted a few visits ago, which confirms pt medication change on 01-06-12, pt advised he needs a refill, sent Lopressor with sig to take 25mg  BID, pt understood and refill sent via escribe per pharmacy rep clarified pt last refill was for 25mg  not 50mg , error noted in chart has been changed

## 2013-01-15 NOTE — Telephone Encounter (Signed)
Tried to call pt number in chart however noted out of service at this time, will try again later per was able to make contact on same number yesterday

## 2013-01-19 ENCOUNTER — Other Ambulatory Visit: Payer: Self-pay | Admitting: Physician Assistant

## 2013-03-05 ENCOUNTER — Emergency Department (HOSPITAL_COMMUNITY)
Admission: EM | Admit: 2013-03-05 | Discharge: 2013-03-05 | Disposition: A | Discharge status: Home / Self Care | Payer: Medicaid Other | Attending: Emergency Medicine | Admitting: Emergency Medicine

## 2013-03-05 ENCOUNTER — Encounter (HOSPITAL_COMMUNITY): Payer: Self-pay | Admitting: *Deleted

## 2013-03-05 DIAGNOSIS — R221 Localized swelling, mass and lump, neck: Secondary | ICD-10-CM | POA: Insufficient documentation

## 2013-03-05 DIAGNOSIS — K089 Disorder of teeth and supporting structures, unspecified: Secondary | ICD-10-CM | POA: Insufficient documentation

## 2013-03-05 DIAGNOSIS — I252 Old myocardial infarction: Secondary | ICD-10-CM | POA: Insufficient documentation

## 2013-03-05 DIAGNOSIS — E7800 Pure hypercholesterolemia, unspecified: Secondary | ICD-10-CM | POA: Insufficient documentation

## 2013-03-05 DIAGNOSIS — J45909 Unspecified asthma, uncomplicated: Secondary | ICD-10-CM | POA: Insufficient documentation

## 2013-03-05 DIAGNOSIS — K047 Periapical abscess without sinus: Secondary | ICD-10-CM | POA: Insufficient documentation

## 2013-03-05 DIAGNOSIS — I2510 Atherosclerotic heart disease of native coronary artery without angina pectoris: Secondary | ICD-10-CM | POA: Insufficient documentation

## 2013-03-05 DIAGNOSIS — Z880 Allergy status to penicillin: Secondary | ICD-10-CM | POA: Insufficient documentation

## 2013-03-05 DIAGNOSIS — Z79899 Other long term (current) drug therapy: Secondary | ICD-10-CM | POA: Insufficient documentation

## 2013-03-05 DIAGNOSIS — F17200 Nicotine dependence, unspecified, uncomplicated: Secondary | ICD-10-CM | POA: Insufficient documentation

## 2013-03-05 DIAGNOSIS — I10 Essential (primary) hypertension: Secondary | ICD-10-CM | POA: Insufficient documentation

## 2013-03-05 DIAGNOSIS — R220 Localized swelling, mass and lump, head: Secondary | ICD-10-CM | POA: Insufficient documentation

## 2013-03-05 DIAGNOSIS — Z7902 Long term (current) use of antithrombotics/antiplatelets: Secondary | ICD-10-CM | POA: Insufficient documentation

## 2013-03-05 DIAGNOSIS — K0889 Other specified disorders of teeth and supporting structures: Secondary | ICD-10-CM

## 2013-03-05 DIAGNOSIS — Z9861 Coronary angioplasty status: Secondary | ICD-10-CM | POA: Insufficient documentation

## 2013-03-05 DIAGNOSIS — Z8679 Personal history of other diseases of the circulatory system: Secondary | ICD-10-CM | POA: Insufficient documentation

## 2013-03-05 MED ORDER — CLINDAMYCIN HCL 150 MG PO CAPS: ORAL_CAPSULE | ORAL | Status: DC

## 2013-03-05 MED ORDER — HYDROCODONE-ACETAMINOPHEN 5-325 MG PO TABS: 1.0000 | ORAL_TABLET | Freq: Four times a day (QID) | ORAL | Status: DC | PRN

## 2013-03-05 MED ORDER — HYDROCODONE-ACETAMINOPHEN 5-325 MG PO TABS
2.0000 | ORAL_TABLET | Freq: Once | ORAL | Status: AC
  Administered 2013-03-05: 2 via ORAL
  Filled 2013-03-05: qty 2

## 2013-03-05 MED ORDER — CLINDAMYCIN HCL 150 MG PO CAPS
300.0000 mg | ORAL_CAPSULE | Freq: Once | ORAL | Status: AC
  Administered 2013-03-05: 300 mg via ORAL
  Filled 2013-03-05: qty 2

## 2013-03-05 NOTE — ED Notes (Signed)
Pain, swelling lt side of  Face.

## 2013-03-05 NOTE — ED Provider Notes (Signed)
History  This chart was scribed for Benny Lennert, MD by Bennett Scrape, ED Scribe. This patient was seen in room APA18/APA18 and the patient's care was started at 8:19 PM.  CSN: [id number]  Arrival date & time 03/05/13  1953   First MD Initiated Contact with Patient 03/05/13 2019      Chief Complaint  Patient presents with  . Dental Pain     Patient is a 32 y.o. male presenting with tooth pain. The history is provided by the patient. No language interpreter was used.  Dental Pain Location:  Upper Severity:  Moderate Onset quality:  Gradual Duration:  1 day Timing:  Constant Progression:  Worsening Chronicity:  New Context: abscess   Relieved by:  Nothing Worsened by:  Pressure, touching, hot food/drink, cold food/drink and jaw movement Ineffective treatments:  None tried Associated symptoms: facial swelling   Associated symptoms: no congestion, no difficulty swallowing, no headaches and no neck pain     HPI Comments: Nathan Waters is a 32 y.o. male who presents to the Emergency Department complaining of a gradual onset, gradually worsening, constant left upper dental abscess with associated left sided facial swelling that he noticed yesterday. He denies fever, chills, nausea and emesis as associated symptoms. He has a h/o HTN, CAD and HLD. Pt is a current everyday smoker and occasional alcohol user.  Pt does not have a dentist.   Past Medical History  Diagnosis Date  . Migraine   . STEMI (ST elevation myocardial infarction)   . History of ventricular fibrillation     S/p cardioversion  . CAD (coronary artery disease)     s/p BMS to mid LCx  . Hypertension   . Hypercholesteremia   . Asthma     Past Surgical History  Procedure Laterality Date  . Splenectomy    . Back surgery    . Coronary stent placement      Family History  Problem Relation Age of Onset  . Malignant hyperthermia Mother   . Malignant hyperthermia Brother   . Heart disease Brother   .  Heart disease Father     History  Substance Use Topics  . Smoking status: Current Every Day Smoker -- 0.50 packs/day    Types: Cigarettes  . Smokeless tobacco: Not on file  . Alcohol Use: 0.0 oz/week     Comment: occasionally      Review of Systems  Constitutional: Negative for appetite change and fatigue.  HENT: Positive for facial swelling. Negative for congestion, neck pain, sinus pressure and ear discharge.   Eyes: Negative for discharge.  Respiratory: Negative for cough.   Cardiovascular: Negative for chest pain.  Gastrointestinal: Negative for abdominal pain and diarrhea.  Genitourinary: Negative for frequency and hematuria.  Musculoskeletal: Negative for back pain.  Skin: Negative for rash.  Neurological: Negative for seizures and headaches.  Psychiatric/Behavioral: Negative for hallucinations.    Allergies  Penicillins  Home Medications   Current Outpatient Rx  Name  Route  Sig  Dispense  Refill  . EXPIRED: albuterol (PROVENTIL HFA;VENTOLIN HFA) 108 (90 BASE) MCG/ACT inhaler   Inhalation   Inhale 2 puffs into the lungs every 4 (four) hours as needed for wheezing or shortness of breath.   1 Inhaler   3   . clopidogrel (PLAVIX) 75 MG tablet      TAKE 1 TABLET BY MOUTH EVERY DAY   90 tablet   1   . cyclobenzaprine (FLEXERIL) 10 MG tablet   Oral  Take 1 tablet (10 mg total) by mouth 2 (two) times daily as needed for muscle spasms.   20 tablet   0   . ibuprofen (ADVIL,MOTRIN) 800 MG tablet   Oral   Take 1 tablet (800 mg total) by mouth every 8 (eight) hours as needed for pain.   30 tablet   0   . metoprolol tartrate (LOPRESSOR) 25 MG tablet   Oral   Take 1 tablet (25 mg total) by mouth 2 (two) times daily.   90 tablet   1   . NITROSTAT 0.4 MG SL tablet      PLACE 1 TABLET UNDER TONGUE EVERY 5 MINUTES AS NEEDED FOR CHEST PAIN   25 tablet   3   . permethrin (ELIMITE) 5 % cream   Topical   Apply topically once.         . pravastatin  (PRAVACHOL) 40 MG tablet   Oral   Take 40 mg by mouth daily.           Triage Vitals: BP 133/86  Pulse 93  Temp(Src) 99 F (37.2 C)  Resp 20  Ht 5\' 11"  (1.803 m)  Wt 270 lb (122.471 kg)  BMI 37.67 kg/m2  SpO2 97%  Physical Exam  Nursing note and vitals reviewed. Constitutional: He is oriented to person, place, and time. He appears well-developed and well-nourished.  HENT:  Head: Atraumatic.  Very poor dentition, tenderness to a broken tooth on the left upper side, mild left sided facial swelling  Eyes: Conjunctivae are normal.  Neck: No tracheal deviation present.  Cardiovascular: Normal rate.   Pulmonary/Chest: Effort normal. No respiratory distress.  Musculoskeletal: Normal range of motion.  Neurological: He is alert and oriented to person, place, and time.  Skin: Skin is warm and dry.  Psychiatric: He has a normal mood and affect. His behavior is normal.    ED Course  Procedures (including critical care time)  Medications - No data to display  DIAGNOSTIC STUDIES: Oxygen Saturation is 97% on room air, normal by my interpretation.    COORDINATION OF CARE: 8:26 PM-Discussed treatment plan which includes antibiotics and pain medications with pt at bedside and pt agreed to plan.   Labs Reviewed - No data to display No results found.   No diagnosis found.    MDM        The chart was scribed for me under my direct supervision.  I personally performed the history, physical, and medical decision making and all procedures in the evaluation of this patient.Benny Lennert, MD 03/05/13 2035

## 2013-03-07 ENCOUNTER — Telehealth: Payer: Self-pay | Admitting: *Deleted

## 2013-03-07 MED ORDER — PRAVASTATIN SODIUM 40 MG PO TABS: 40.0000 mg | ORAL_TABLET | Freq: Every day | ORAL | Status: DC

## 2013-03-07 NOTE — Telephone Encounter (Signed)
Incoming fax of patient medication refill request noted per patient pharmacy. FOR PRAVASTIN FROM [facility] rx sent to pharmacy by e-script

## 2013-05-20 ENCOUNTER — Other Ambulatory Visit: Payer: Self-pay | Admitting: *Deleted

## 2013-05-21 ENCOUNTER — Emergency Department (HOSPITAL_COMMUNITY)
Admission: EM | Admit: 2013-05-21 | Discharge: 2013-05-21 | Disposition: A | Discharge status: Home / Self Care | Payer: Medicaid Other | Attending: Emergency Medicine | Admitting: Emergency Medicine

## 2013-05-21 ENCOUNTER — Emergency Department (HOSPITAL_COMMUNITY): Payer: Medicaid Other

## 2013-05-21 DIAGNOSIS — Z79899 Other long term (current) drug therapy: Secondary | ICD-10-CM | POA: Insufficient documentation

## 2013-05-21 DIAGNOSIS — Z880 Allergy status to penicillin: Secondary | ICD-10-CM | POA: Insufficient documentation

## 2013-05-21 DIAGNOSIS — J45909 Unspecified asthma, uncomplicated: Secondary | ICD-10-CM | POA: Insufficient documentation

## 2013-05-21 DIAGNOSIS — Z9861 Coronary angioplasty status: Secondary | ICD-10-CM | POA: Insufficient documentation

## 2013-05-21 DIAGNOSIS — M549 Dorsalgia, unspecified: Secondary | ICD-10-CM

## 2013-05-21 DIAGNOSIS — F17200 Nicotine dependence, unspecified, uncomplicated: Secondary | ICD-10-CM | POA: Insufficient documentation

## 2013-05-21 DIAGNOSIS — I10 Essential (primary) hypertension: Secondary | ICD-10-CM | POA: Insufficient documentation

## 2013-05-21 DIAGNOSIS — Z7902 Long term (current) use of antithrombotics/antiplatelets: Secondary | ICD-10-CM | POA: Insufficient documentation

## 2013-05-21 DIAGNOSIS — I2510 Atherosclerotic heart disease of native coronary artery without angina pectoris: Secondary | ICD-10-CM | POA: Insufficient documentation

## 2013-05-21 DIAGNOSIS — E7800 Pure hypercholesterolemia, unspecified: Secondary | ICD-10-CM | POA: Insufficient documentation

## 2013-05-21 DIAGNOSIS — Z9889 Other specified postprocedural states: Secondary | ICD-10-CM | POA: Insufficient documentation

## 2013-05-21 DIAGNOSIS — I252 Old myocardial infarction: Secondary | ICD-10-CM | POA: Insufficient documentation

## 2013-05-21 DIAGNOSIS — Z8679 Personal history of other diseases of the circulatory system: Secondary | ICD-10-CM | POA: Insufficient documentation

## 2013-05-21 DIAGNOSIS — M546 Pain in thoracic spine: Secondary | ICD-10-CM | POA: Insufficient documentation

## 2013-05-21 MED ORDER — HYDROCODONE-ACETAMINOPHEN 5-325 MG PO TABS
1.0000 | ORAL_TABLET | Freq: Once | ORAL | Status: AC
  Administered 2013-05-21: 1 via ORAL
  Filled 2013-05-21: qty 1

## 2013-05-21 MED ORDER — HYDROCODONE-ACETAMINOPHEN 5-325 MG PO TABS: 1.0000 | ORAL_TABLET | Freq: Four times a day (QID) | ORAL | Status: DC | PRN

## 2013-05-21 MED ORDER — METOPROLOL TARTRATE 25 MG PO TABS: 25.0000 mg | ORAL_TABLET | Freq: Two times a day (BID) | ORAL | Status: DC

## 2013-05-21 NOTE — Telephone Encounter (Signed)
Medication sent via escribe.  

## 2013-05-21 NOTE — ED Provider Notes (Addendum)
CSN: [id number]     Arrival date & time 05/21/13  0424 History     First MD Initiated Contact with Patient 05/21/13 [phone]     Chief Complaint  Patient presents with  . Muscle Pain    .pain bewtween shoulder blades . has hx of stemi   (Consider location/radiation/quality/duration/timing/severity/associated sxs/prior Treatment) HPI This is a 32 year old male with about a 2 hour a history of pain in his upper back. It is located between her shoulder blades about the midline. He is not aware of any injury or exertion they have caused it. The pain is worse with movement of the back, movement of the shoulders, deep breathing and palpation. It is sharp in nature. He describes it as feeling like muscle pain. The symptoms are moderate to severe depending on position and movement. There is no associated shortness of breath, nausea, vomiting, diaphoresis or cough. He has had a heart attack he states this feels in no way like that pain.   Past Medical History  Diagnosis Date  . Migraine   . STEMI (ST elevation myocardial infarction)   . History of ventricular fibrillation     S/p cardioversion  . CAD (coronary artery disease)     s/p BMS to mid LCx  . Hypertension   . Hypercholesteremia   . Asthma    Past Surgical History  Procedure Laterality Date  . Splenectomy    . Back surgery    . Coronary stent placement     Family History  Problem Relation Age of Onset  . Malignant hyperthermia Mother   . Malignant hyperthermia Brother   . Heart disease Brother   . Heart disease Father    History  Substance Use Topics  . Smoking status: Current Every Day Smoker -- 0.50 packs/day    Types: Cigarettes  . Smokeless tobacco: Not on file  . Alcohol Use: 0.0 oz/week     Comment: occasionally    Review of Systems  All other systems reviewed and are negative.    Allergies  Penicillins  Home Medications   Current Outpatient Rx  Name  Route  Sig  Dispense  Refill  . albuterol (PROVENTIL  HFA;VENTOLIN HFA) 108 (90 BASE) MCG/ACT inhaler   Inhalation   Inhale 2 puffs into the lungs every 4 (four) hours as needed for wheezing or shortness of breath.   1 Inhaler   3   . clopidogrel (PLAVIX) 75 MG tablet   Oral   Take 75 mg by mouth daily.         . metoprolol tartrate (LOPRESSOR) 25 MG tablet   Oral   Take 1 tablet (25 mg total) by mouth 2 (two) times daily.   90 tablet   1   . nitroGLYCERIN (NITROSTAT) 0.4 MG SL tablet   Sublingual   Place 0.4 mg under the tongue every 5 (five) minutes as needed for chest pain.         . pravastatin (PRAVACHOL) 40 MG tablet   Oral   Take 1 tablet (40 mg total) by mouth daily.   30 tablet   6   . clindamycin (CLEOCIN) 150 MG capsule      Take 2 tablets 4 times a day   80 capsule   0   . HYDROcodone-acetaminophen (NORCO/VICODIN) 5-325 MG per tablet   Oral   Take 1 tablet by mouth every 6 (six) hours as needed for pain.   40 tablet   0    BP 121/69  Pulse 57  Temp(Src) 97.6 F (36.4 C) (Oral)  Resp 20  Ht 5\' 11"  (1.803 m)  Wt 263 lb (119.296 kg)  BMI 36.7 kg/m2  SpO2 96%  Physical Exam General: Well-developed, well-nourished male in no acute distress; appearance consistent with age of record HENT: normocephalic, atraumatic Eyes: pupils equal round and reactive to light; extraocular muscles intact Neck: supple Heart: regular rate and rhythm; no murmurs, rubs or gallops Lungs: clear to auscultation bilaterally Abdomen: soft; nondistended; nontender; bowel sounds present Back: Tenderness of mid upper back without deformity; pain on movement of the back or shoulders Extremities: No deformity; full range of motion; pulses normal; there Neurologic: Awake, alert and oriented; motor function intact in all extremities and symmetric; no facial droop Skin: Warm and dry Psychiatric: Normal mood and affect    ED Course   Procedures (including critical care time)   MDM  Nursing notes and vitals signs, including  pulse oximetry, reviewed.  Summary of this visit's results, reviewed by myself:  Labs:  No results found for this or any previous visit (from the past 24 hour(s)).  Imaging Studies: Dg Chest 2 View  05/21/2013   *RADIOLOGY REPORT*  Clinical Data: Pain between the shoulder blades.  CHEST - 2 VIEW  Comparison: Single view of the chest 02/06/2012.  Findings: Lungs are clear.  There is peribronchial thickening.  No consolidative process, pneumothorax or effusion.  Heart size is normal.  IMPRESSION: Bronchitic change without focal process.   Original Report Authenticated By: Holley Dexter, M.D.    EKG Interpretation:  Date & Time: 05/21/2013 4:35 AM  Rate: 59  Rhythm: sinus bradycardia and sinus arrhythmia  QRS Axis: normal  Intervals: normal  ST/T Wave abnormalities: normal  Conduction Disutrbances:none  Narrative Interpretation:   Old EKG Reviewed: rate is slower  Pain is clearly musculoskeletal on exam. Low suspicion for cardiac, pulmonary or vascular etiology.     Hanley Seamen, MD 05/21/13 [id number]  Hanley Seamen, MD 05/21/13 [phone]

## 2013-06-20 ENCOUNTER — Emergency Department (HOSPITAL_COMMUNITY)
Admission: EM | Admit: 2013-06-20 | Discharge: 2013-06-21 | Disposition: A | Discharge status: Home / Self Care | Payer: Medicaid Other | Attending: Emergency Medicine | Admitting: Emergency Medicine

## 2013-06-20 ENCOUNTER — Emergency Department (HOSPITAL_COMMUNITY): Payer: Medicaid Other

## 2013-06-20 ENCOUNTER — Encounter (HOSPITAL_COMMUNITY): Payer: Self-pay | Admitting: *Deleted

## 2013-06-20 DIAGNOSIS — Z79899 Other long term (current) drug therapy: Secondary | ICD-10-CM | POA: Insufficient documentation

## 2013-06-20 DIAGNOSIS — I2510 Atherosclerotic heart disease of native coronary artery without angina pectoris: Secondary | ICD-10-CM | POA: Insufficient documentation

## 2013-06-20 DIAGNOSIS — E7800 Pure hypercholesterolemia, unspecified: Secondary | ICD-10-CM | POA: Insufficient documentation

## 2013-06-20 DIAGNOSIS — I252 Old myocardial infarction: Secondary | ICD-10-CM | POA: Insufficient documentation

## 2013-06-20 DIAGNOSIS — Z87828 Personal history of other (healed) physical injury and trauma: Secondary | ICD-10-CM | POA: Insufficient documentation

## 2013-06-20 DIAGNOSIS — I10 Essential (primary) hypertension: Secondary | ICD-10-CM | POA: Insufficient documentation

## 2013-06-20 DIAGNOSIS — Z880 Allergy status to penicillin: Secondary | ICD-10-CM | POA: Insufficient documentation

## 2013-06-20 DIAGNOSIS — M25511 Pain in right shoulder: Secondary | ICD-10-CM

## 2013-06-20 DIAGNOSIS — F17200 Nicotine dependence, unspecified, uncomplicated: Secondary | ICD-10-CM | POA: Insufficient documentation

## 2013-06-20 DIAGNOSIS — M25519 Pain in unspecified shoulder: Secondary | ICD-10-CM | POA: Insufficient documentation

## 2013-06-20 DIAGNOSIS — Z7902 Long term (current) use of antithrombotics/antiplatelets: Secondary | ICD-10-CM | POA: Insufficient documentation

## 2013-06-20 DIAGNOSIS — J45909 Unspecified asthma, uncomplicated: Secondary | ICD-10-CM | POA: Insufficient documentation

## 2013-06-20 DIAGNOSIS — Z9889 Other specified postprocedural states: Secondary | ICD-10-CM | POA: Insufficient documentation

## 2013-06-20 MED ORDER — MELOXICAM 7.5 MG PO TABS: 7.5000 mg | ORAL_TABLET | Freq: Every day | ORAL | Status: DC

## 2013-06-20 MED ORDER — OXYCODONE-ACETAMINOPHEN 5-325 MG PO TABS
1.0000 | ORAL_TABLET | Freq: Once | ORAL | Status: AC
  Administered 2013-06-20: 1 via ORAL
  Filled 2013-06-20: qty 1

## 2013-06-20 NOTE — ED Notes (Signed)
Rt shoulder pain, Onset while watching tv.

## 2013-06-20 NOTE — ED Notes (Signed)
Pt reporting pain in right shoulder.  Decreased ROM due to pain.  Denies numbness or tingling.  Radial pulse strong and palpable.  No deformity noted. Pt denies injury, and states pain began while he was sitting on the couch.

## 2013-06-20 NOTE — ED Provider Notes (Signed)
Date: 06/20/2013  Rate: 61  Rhythm: normal sinus rhythm  QRS Axis: normal  Intervals: normal  ST/T Wave abnormalities: nonspecific ST changes  Conduction Disutrbances:none  Narrative Interpretation:   Old EKG Reviewed: unchanged from 05/21/13    Joya Gaskins, MD 06/20/13 [phone]

## 2013-06-20 NOTE — ED Provider Notes (Signed)
CSN: [id number]     Arrival date & time 06/20/13  2155 History   First MD Initiated Contact with Patient 06/20/13 2214     Chief Complaint  Patient presents with  . Shoulder Pain   (Consider location/radiation/quality/duration/timing/severity/associated sxs/prior Treatment) Patient is a 31 y.o. male presenting with shoulder pain. The history is provided by the patient.  Shoulder Pain This is a recurrent problem. The current episode started today. The problem occurs constantly. Pertinent negatives include no chest pain, chills, fever, headaches, nausea, neck pain, sore throat or vomiting.   Nathan Waters is a 32 y.o. male who presents to the ED with right shoulder pain that started suddenly approximately 6 pm. Patient states he started to raise his arm and it felt like a sharp pain shoot through the shoulder. Has continued to hurt but if no movement the pain is 4/10 and with movement is 8/10. He injured the shoulder 5 years ago and has problems off and on since then.   Past Medical History  Diagnosis Date  . Migraine   . STEMI (ST elevation myocardial infarction)   . History of ventricular fibrillation     S/p cardioversion  . CAD (coronary artery disease)     s/p BMS to mid LCx  . Hypertension   . Hypercholesteremia   . Asthma    Past Surgical History  Procedure Laterality Date  . Splenectomy    . Back surgery    . Coronary stent placement     Family History  Problem Relation Age of Onset  . Malignant hyperthermia Mother   . Malignant hyperthermia Brother   . Heart disease Brother   . Heart disease Father    History  Substance Use Topics  . Smoking status: Current Every Day Smoker -- 0.50 packs/day    Types: Cigarettes  . Smokeless tobacco: Not on file  . Alcohol Use: 0.0 oz/week     Comment: occasionally    Review of Systems  Constitutional: Negative for fever and chills.  HENT: Negative for sore throat and neck pain.   Respiratory: Negative for shortness of  breath.   Cardiovascular: Negative for chest pain.  Gastrointestinal: Negative for nausea and vomiting.  Musculoskeletal:       Right shoulder pain  Skin: Negative for wound.  Neurological: Negative for headaches.  Psychiatric/Behavioral: The patient is not nervous/anxious.     Allergies  Penicillins  Home Medications   Current Outpatient Rx  Name  Route  Sig  Dispense  Refill  . EXPIRED: albuterol (PROVENTIL HFA;VENTOLIN HFA) 108 (90 BASE) MCG/ACT inhaler   Inhalation   Inhale 2 puffs into the lungs every 4 (four) hours as needed for wheezing or shortness of breath.   1 Inhaler   3   . clindamycin (CLEOCIN) 150 MG capsule      Take 2 tablets 4 times a day   80 capsule   0   . clopidogrel (PLAVIX) 75 MG tablet   Oral   Take 75 mg by mouth daily.         Marland Kitchen HYDROcodone-acetaminophen (NORCO/VICODIN) 5-325 MG per tablet   Oral   Take 1-2 tablets by mouth every 6 (six) hours as needed for pain.   20 tablet   0   . metoprolol tartrate (LOPRESSOR) 25 MG tablet   Oral   Take 1 tablet (25 mg total) by mouth 2 (two) times daily.   90 tablet   1   . nitroGLYCERIN (NITROSTAT) 0.4 MG SL tablet  Sublingual   Place 0.4 mg under the tongue every 5 (five) minutes as needed for chest pain.         . pravastatin (PRAVACHOL) 40 MG tablet   Oral   Take 1 tablet (40 mg total) by mouth daily.   30 tablet   6    BP 130/64  Pulse 75  Temp(Src) 98.4 F (36.9 C) (Oral)  Resp 20  Ht 5\' 11"  (1.803 m)  Wt 230 lb (104.327 kg)  BMI 32.09 kg/m2  SpO2 97% Physical Exam  Nursing note and vitals reviewed. Constitutional: He is oriented to person, place, and time. He appears well-developed and well-nourished. No distress.  HENT:  Head: Normocephalic and atraumatic.  Eyes: Conjunctivae and EOM are normal. Pupils are equal, round, and reactive to light.  Neck: Normal range of motion. Neck supple.  Cardiovascular: Normal rate.   Pulmonary/Chest: Effort normal.  Abdominal: Soft.  There is no tenderness.  Musculoskeletal:       Right shoulder: He exhibits decreased range of motion, tenderness and pain. He exhibits no swelling, no crepitus, no deformity, normal pulse and normal strength.  Pain with palpation of the right deltoid and with range of motion. Pain radiates to the shoulder. Radial pulses strong, adequate circulation, good touch sensation.   Neurological: He is alert and oriented to person, place, and time. No cranial nerve deficit.  Skin: Skin is warm and dry.  Psychiatric: He has a normal mood and affect. His behavior is normal.   Dg Shoulder Right  06/20/2013   *RADIOLOGY REPORT*  Clinical Data: Shoulder pain  RIGHT SHOULDER - 2+ VIEW  Comparison: 08/01/2004  Findings: The humeral head is located.  No evidence of subluxation or dislocation.  No acute or healing fracture or focal bony abnormality is identified.  IMPRESSION: No acute bony abnormality.   Original Report Authenticated By: Britta Mccreedy, M.D.    ED Course  Procedures   EKG reviewed by Dr. Bebe Shaggy MDM  32 y.o. male with right shoulder pain. Will treat with medication for inflammation. He is to follow up with his PCP. He will return here for any problems.  Discussed with the patient and all questioned fully answered.   Medication List    TAKE these medications       meloxicam 7.5 MG tablet  Commonly known as:  MOBIC  Take 1 tablet (7.5 mg total) by mouth daily.      ASK your doctor about these medications       albuterol 108 (90 BASE) MCG/ACT inhaler  Commonly known as:  PROVENTIL HFA;VENTOLIN HFA  Inhale 2 puffs into the lungs every 4 (four) hours as needed for wheezing or shortness of breath.     clopidogrel 75 MG tablet  Commonly known as:  PLAVIX  Take 75 mg by mouth daily.     metoprolol tartrate 25 MG tablet  Commonly known as:  LOPRESSOR  Take 1 tablet (25 mg total) by mouth 2 (two) times daily.     nitroGLYCERIN 0.4 MG SL tablet  Commonly known as:  NITROSTAT  Place 0.4  mg under the tongue every 5 (five) minutes as needed for chest pain.     pravastatin 40 MG tablet  Commonly known as:  PRAVACHOL  Take 1 tablet (40 mg total) by mouth daily.       Janne Napoleon, NP 06/21/13 [phone]

## 2013-06-22 NOTE — ED Provider Notes (Signed)
Medical screening examination/treatment/procedure(s) were performed by non-physician practitioner and as supervising physician I was immediately available for consultation/collaboration.  Srija Southard, MD 06/22/13 1643 

## 2013-07-17 ENCOUNTER — Other Ambulatory Visit: Payer: Self-pay | Admitting: Cardiology

## 2013-12-24 ENCOUNTER — Other Ambulatory Visit: Payer: Self-pay | Admitting: Cardiology

## 2014-02-05 ENCOUNTER — Encounter: Payer: Self-pay | Admitting: Cardiology

## 2014-02-05 ENCOUNTER — Ambulatory Visit (INDEPENDENT_AMBULATORY_CARE_PROVIDER_SITE_OTHER): Payer: Medicaid Other | Admitting: Cardiology

## 2014-02-05 VITALS — BP 127/75 | HR 61 | Ht 71.0 in | Wt 261.0 lb

## 2014-02-05 DIAGNOSIS — F17200 Nicotine dependence, unspecified, uncomplicated: Secondary | ICD-10-CM

## 2014-02-05 DIAGNOSIS — Z720 Tobacco use: Secondary | ICD-10-CM

## 2014-02-05 DIAGNOSIS — E782 Mixed hyperlipidemia: Secondary | ICD-10-CM

## 2014-02-05 DIAGNOSIS — I2510 Atherosclerotic heart disease of native coronary artery without angina pectoris: Secondary | ICD-10-CM

## 2014-02-05 NOTE — Assessment & Plan Note (Signed)
He continues on Pravachol. Due for followup FLP and LFT which will be arranged.

## 2014-02-05 NOTE — Assessment & Plan Note (Signed)
We discussed critical importance of complete smoking cessation.

## 2014-02-05 NOTE — Progress Notes (Signed)
Clinical Summary Nathan Waters is a 33 y.o.male presenting for a followup visit. This is my first meeting with him, he was last seen by Dr. Verl Blalock in February 2014. Cardiac history is reviewed below. He has done very well since then, denies any angina. Has had no unusual shortness of breath. Recently played basketball with some friends with no major limitation.  I reviewed his medications. He has remained on aspirin and Plavix despite use of BMS back in 2013. We discussed simplifying this to an aspirin going forward. He otherwise reports compliance with statin therapy.  Cannot locate recent lipid panel, lab work from 2013 showed cholesterol 105, triglycerides 99, HDL 35, LDL 50. ECG from September 2014 shows sinus arrhythmia with no acute ST segment changes.  Echocardiogram from February 2013 showed mildly dilated left ventricle with LVEF 55-60%, hypokinesis of the mid to basal posterior lateral wall, mildly dilated left atrium.  He continues to smoke cigarettes. He has been able to cut back. We discussed the critical importance of complete smoking cessation.   Allergies  Allergen Reactions  . Penicillins Anaphylaxis    Current Outpatient Prescriptions  Medication Sig Dispense Refill  . aspirin 325 MG EC tablet Take 325 mg by mouth daily.      . metoprolol tartrate (LOPRESSOR) 25 MG tablet TAKE 1 TABLET BY MOUTH TWICE DAILY  90 tablet  0  . nitroGLYCERIN (NITROSTAT) 0.4 MG SL tablet Place 0.4 mg under the tongue every 5 (five) minutes as needed for chest pain.      . pravastatin (PRAVACHOL) 40 MG tablet Take 1 tablet (40 mg total) by mouth daily.  30 tablet  6  . albuterol (PROVENTIL HFA;VENTOLIN HFA) 108 (90 BASE) MCG/ACT inhaler Inhale 2 puffs into the lungs every 4 (four) hours as needed for wheezing or shortness of breath.  1 Inhaler  3   No current facility-administered medications for this visit.    Past Medical History  Diagnosis Date  . Migraine   . STEMI (ST elevation  myocardial infarction)     Complicated by VF arrest 11/2011  . Coronary atherosclerosis of native coronary artery     BMS to mid circumflex 11/2011, LVEF 55-60%  . Essential hypertension, benign   . Mixed hyperlipidemia   . Asthma     Past Surgical History  Procedure Laterality Date  . Splenectomy    . Back surgery      Family History  Problem Relation Age of Onset  . Malignant hyperthermia Mother   . Malignant hyperthermia Brother   . Heart disease Brother   . Heart disease Father     Social History Nathan Waters reports that he has been smoking Cigarettes.  He has been smoking about 0.50 packs per day. He does not have any smokeless tobacco history on file. Nathan Waters reports that he drinks alcohol.  Review of Systems Negative except as outlined.  Physical Examination Filed Vitals:   02/05/14 1435  BP: 127/75  Pulse: 61   Filed Weights   02/05/14 1435  Weight: 261 lb (118.389 kg)   Overweight male, appears comfortable at rest. HEENT: Conjunctiva and lids normal, oropharynx clear, lip piercings. Neck: Supple, no elevated JVP or carotid bruits, no thyromegaly. Lungs: Clear to auscultation, nonlabored breathing at rest. Cardiac: Regular rate and rhythm, no S3 or significant systolic murmur, no pericardial rub. Abdomen: Soft, nontender, bowel sounds present. Extremities: No pitting edema, distal pulses 2+. Skin: Warm and dry. Musculoskeletal: No kyphosis. Neuropsychiatric: Alert and oriented x3,  affect grossly appropriate.   Problem List and Plan   Coronary atherosclerosis of native coronary artery Symptomatically stable, history of myocardial infarction back in 2013, BMS to the mid circumflex at that time, overall preserved LVEF. Recent ECG reviewed. Plan will be to simplify to coated aspirin 325 mg daily after he completes present bottle of Plavix. Continue observation. Discussed maintaining a fresh bottle of nitroglycerin at least every 6 months to year. Also  discussed warning signs.  Mixed hyperlipidemia He continues on Pravachol. Due for followup FLP and LFT which will be arranged.  Tobacco use We discussed critical importance of complete smoking cessation.    Satira Sark, M.D., F.A.C.C.

## 2014-02-05 NOTE — Assessment & Plan Note (Signed)
Symptomatically stable, history of myocardial infarction back in 2013, BMS to the mid circumflex at that time, overall preserved LVEF. Recent ECG reviewed. Plan will be to simplify to coated aspirin 325 mg daily after he completes present bottle of Plavix. Continue observation. Discussed maintaining a fresh bottle of nitroglycerin at least every 6 months to year. Also discussed warning signs.

## 2014-02-05 NOTE — Patient Instructions (Signed)
Your physician recommends that you schedule a follow-up appointment in: 1 year with Dr Ferne Reus will receive a reminder letter two months in advance reminding you to call and schedule your appointment. If you don't receive this letter, please contact our office.  Finish Plavix then start taking aspirin 325 daily.  Your physician recommends that you return for lab work in the next week. LIVER/LIPID

## 2014-02-17 ENCOUNTER — Other Ambulatory Visit (HOSPITAL_COMMUNITY): Payer: Self-pay | Admitting: Cardiology

## 2014-04-18 ENCOUNTER — Encounter (HOSPITAL_COMMUNITY): Payer: Self-pay | Admitting: Emergency Medicine

## 2014-04-18 ENCOUNTER — Emergency Department (HOSPITAL_COMMUNITY): Payer: Medicaid Other

## 2014-04-18 ENCOUNTER — Emergency Department (HOSPITAL_COMMUNITY)
Admission: EM | Admit: 2014-04-18 | Discharge: 2014-04-18 | Disposition: A | Discharge status: Home / Self Care | Payer: Medicaid Other | Attending: Emergency Medicine | Admitting: Emergency Medicine

## 2014-04-18 DIAGNOSIS — I10 Essential (primary) hypertension: Secondary | ICD-10-CM | POA: Insufficient documentation

## 2014-04-18 DIAGNOSIS — R079 Chest pain, unspecified: Secondary | ICD-10-CM

## 2014-04-18 DIAGNOSIS — J45909 Unspecified asthma, uncomplicated: Secondary | ICD-10-CM | POA: Insufficient documentation

## 2014-04-18 DIAGNOSIS — F17200 Nicotine dependence, unspecified, uncomplicated: Secondary | ICD-10-CM | POA: Insufficient documentation

## 2014-04-18 DIAGNOSIS — Z79899 Other long term (current) drug therapy: Secondary | ICD-10-CM | POA: Insufficient documentation

## 2014-04-18 DIAGNOSIS — I252 Old myocardial infarction: Secondary | ICD-10-CM | POA: Insufficient documentation

## 2014-04-18 DIAGNOSIS — I2510 Atherosclerotic heart disease of native coronary artery without angina pectoris: Secondary | ICD-10-CM | POA: Insufficient documentation

## 2014-04-18 DIAGNOSIS — Z880 Allergy status to penicillin: Secondary | ICD-10-CM | POA: Insufficient documentation

## 2014-04-18 DIAGNOSIS — E782 Mixed hyperlipidemia: Secondary | ICD-10-CM | POA: Insufficient documentation

## 2014-04-18 DIAGNOSIS — Z7982 Long term (current) use of aspirin: Secondary | ICD-10-CM | POA: Insufficient documentation

## 2014-04-18 LAB — COMPREHENSIVE METABOLIC PANEL
ALK PHOS: 123 U/L — AB (ref 39–117)
ALT: 34 U/L (ref 0–53)
AST: 26 U/L (ref 0–37)
Albumin: 3.7 g/dL (ref 3.5–5.2)
Anion gap: 11 (ref 5–15)
BILIRUBIN TOTAL: 0.3 mg/dL (ref 0.3–1.2)
BUN: 9 mg/dL (ref 6–23)
CHLORIDE: 103 meq/L (ref 96–112)
CO2: 24 meq/L (ref 19–32)
Calcium: 9.6 mg/dL (ref 8.4–10.5)
Creatinine, Ser: 0.81 mg/dL (ref 0.50–1.35)
GLUCOSE: 111 mg/dL — AB (ref 70–99)
POTASSIUM: 3.7 meq/L (ref 3.7–5.3)
SODIUM: 138 meq/L (ref 137–147)
Total Protein: 7.3 g/dL (ref 6.0–8.3)

## 2014-04-18 LAB — CBC WITH DIFFERENTIAL/PLATELET
Basophils Absolute: 0 10*3/uL (ref 0.0–0.1)
Basophils Relative: 0 % (ref 0–1)
Eosinophils Absolute: 0.2 10*3/uL (ref 0.0–0.7)
Eosinophils Relative: 1 % (ref 0–5)
HCT: 43.3 % (ref 39.0–52.0)
HEMOGLOBIN: 15.1 g/dL (ref 13.0–17.0)
LYMPHS ABS: 3.8 10*3/uL (ref 0.7–4.0)
LYMPHS PCT: 32 % (ref 12–46)
MCH: 32.3 pg (ref 26.0–34.0)
MCHC: 34.9 g/dL (ref 30.0–36.0)
MCV: 92.5 fL (ref 78.0–100.0)
MONOS PCT: 7 % (ref 3–12)
Monocytes Absolute: 0.9 10*3/uL (ref 0.1–1.0)
NEUTROS PCT: 60 % (ref 43–77)
Neutro Abs: 7.1 10*3/uL (ref 1.7–7.7)
PLATELETS: 359 10*3/uL (ref 150–400)
RBC: 4.68 MIL/uL (ref 4.22–5.81)
RDW: 14.4 % (ref 11.5–15.5)
WBC: 12 10*3/uL — AB (ref 4.0–10.5)

## 2014-04-18 LAB — TROPONIN I
Troponin I: <0.3 ng/mL (ref <0.30)
Troponin I: <0.3 ng/mL (ref <0.30)

## 2014-04-18 MED ORDER — ASPIRIN 81 MG PO CHEW
324.0000 mg | CHEWABLE_TABLET | Freq: Once | ORAL | Status: AC
  Administered 2014-04-18: 324 mg via ORAL
  Filled 2014-04-18: qty 4

## 2014-04-18 MED ORDER — NITROGLYCERIN 0.4 MG SL SUBL
0.4000 mg | SUBLINGUAL_TABLET | Freq: Once | SUBLINGUAL | Status: AC
  Administered 2014-04-18: 0.4 mg via SUBLINGUAL
  Filled 2014-04-18: qty 1

## 2014-04-18 NOTE — ED Notes (Signed)
Awake, painfree, voided.

## 2014-04-18 NOTE — Discharge Instructions (Signed)

## 2014-04-18 NOTE — ED Notes (Signed)
sleeping

## 2014-04-18 NOTE — ED Notes (Signed)
Pat reports mid sternal chest pressure and sharp pains that started approx 1 hour ago, states he took 3 ntg sl with minimal relief.

## 2014-04-18 NOTE — ED Notes (Signed)
Awake and comfortable

## 2014-04-18 NOTE — ED Provider Notes (Signed)
CSN: [id number]     Arrival date & time 04/18/14  0030 History   First MD Initiated Contact with Patient 04/18/14 0124     No chief complaint on file.    HPI Patient has a history of ST elevation MI in the past.  At the time of his MI in 2013 he had a V. fib arrest in the emergency department and required emergent heart catheterization.  He states his been his normal state of health over the past several weeks without any exertional shortness of breath or chest pain.  He states this evening he developed some sharp left-sided chest discomfort that occurred while he was playing a video game.  Approximately one hour prior to this he had eaten 3 microwavable hamburgers.  He states he took nitroglycerin with some improvement in his symptoms.  At time of my evaluation the patient is chest pain-free.  He denies diaphoresis.  No nausea vomiting.  No shortness of breath.  He states the symptoms feel somewhat different than his prior heart attack.  He's been by with his medications including his aspirin.  He continues to smoke cigarettes.  He has no other complaints at this time.  He is chest pain-free.  No recent cough or congestion.  No fevers or chills.   Past Medical History  Diagnosis Date  . Migraine   . STEMI (ST elevation myocardial infarction)     Complicated by VF arrest 11/2011  . Coronary atherosclerosis of native coronary artery     BMS to mid circumflex 11/2011, LVEF 55-60%  . Essential hypertension, benign   . Mixed hyperlipidemia   . Asthma    Past Surgical History  Procedure Laterality Date  . Splenectomy    . Back surgery     Family History  Problem Relation Age of Onset  . Malignant hyperthermia Mother   . Malignant hyperthermia Brother   . Heart disease Brother   . Heart disease Father    History  Substance Use Topics  . Smoking status: Current Every Day Smoker -- 0.50 packs/day    Types: Cigarettes  . Smokeless tobacco: Not on file  . Alcohol Use: 0.0 oz/week   Comment: Occasionally    Review of Systems  All other systems reviewed and are negative.     Allergies  Penicillins  Home Medications   Prior to Admission medications   Medication Sig Start Date End Date Taking? Authorizing Provider  albuterol (PROVENTIL HFA;VENTOLIN HFA) 108 (90 BASE) MCG/ACT inhaler Inhale 2 puffs into the lungs every 4 (four) hours as needed for wheezing or shortness of breath. 11/24/11 04/18/14 Yes Roger A Arguello, PA-C  aspirin 325 MG EC tablet Take 325 mg by mouth daily.   Yes Historical Provider, MD  metoprolol tartrate (LOPRESSOR) 25 MG tablet TAKE 1 TABLET BY MOUTH TWICE DAILY   Yes Arnoldo Lenis, MD  nitroGLYCERIN (NITROSTAT) 0.4 MG SL tablet Place 0.4 mg under the tongue every 5 (five) minutes as needed for chest pain.   Yes Historical Provider, MD  NITROSTAT 0.4 MG SL tablet PLACE 1 TABLET UNDER THE TONGUE EVERY 5 MINUTES AS NEEDED FOR CHEST PAIN   Yes Satira Sark, MD  pravastatin (PRAVACHOL) 40 MG tablet Take 1 tablet (40 mg total) by mouth daily. 03/07/13  Yes Renella Cunas, MD   BP 108/73  Pulse 55  Temp(Src) 97.9 F (36.6 C) (Oral)  Resp 19  Ht 5\' 11"  (1.803 m)  Wt 260 lb (117.935 kg)  BMI 36.28  kg/m2  SpO2 95% Physical Exam  Nursing note and vitals reviewed. Constitutional: He is oriented to person, place, and time. He appears well-developed and well-nourished.  HENT:  Head: Normocephalic and atraumatic.  Eyes: EOM are normal.  Neck: Normal range of motion.  Cardiovascular: Normal rate, regular rhythm, normal heart sounds and intact distal pulses.   Pulmonary/Chest: Effort normal and breath sounds normal. No respiratory distress.  Abdominal: Soft. He exhibits no distension. There is no tenderness.  Musculoskeletal: Normal range of motion.  Neurological: He is alert and oriented to person, place, and time.  Skin: Skin is warm and dry.  Psychiatric: He has a normal mood and affect. Judgment normal.    ED Course  Procedures  (including critical care time) Labs Review Labs Reviewed  CBC WITH DIFFERENTIAL - Abnormal; Notable for the following:    WBC 12.0 (*)    All other components within normal limits  COMPREHENSIVE METABOLIC PANEL - Abnormal; Notable for the following:    Glucose, Bld 111 (*)    Alkaline Phosphatase 123 (*)    All other components within normal limits  TROPONIN I  TROPONIN I    Imaging Review Dg Chest 1 View  04/18/2014   CLINICAL DATA:  Sternal chest pain.  EXAM: CHEST - 1 VIEW  COMPARISON:  05/21/2013  FINDINGS: The heart size and mediastinal contours are within normal limits. Both lungs are clear. The visualized skeletal structures are unremarkable.  IMPRESSION: No active disease.   Electronically Signed   By: Kerby Moors M.D.   On: 04/18/2014 01:48     EKG Interpretation   Date/Time:  Friday April 18 2014 00:51:51 EDT Ventricular Rate:  75 PR Interval:  161 QRS Duration: 108 QT Interval:  393 QTC Calculation: 439 R Axis:   48 Text Interpretation:  Sinus rhythm No significant change was found  Confirmed by Jacquelyn Antony  MD, Tori Dattilio ([id number]) on 04/18/2014 2:28:36 AM      ECG interpretation  Date: 04/18/2014 0354AM  Rate: 79   Rhythm: normal sinus rhythm  QRS Axis: normal  Intervals: normal  ST/T Wave abnormalities: normal  Conduction Disutrbances: none  Narrative Interpretation:   Old EKG Reviewed: No significant changes noted     MDM   Final diagnoses:  None    Atypical story for acute coronary syndrome.  EKG x2 are without abnormalities.  Troponin x2 are negative.  Outpatient cardiology followup.  Patient and family understand to return to the ER for any new or worsening symptoms.    Hoy Morn, MD 04/18/14 [phone]

## 2014-04-21 ENCOUNTER — Other Ambulatory Visit: Payer: Self-pay | Admitting: Cardiology

## 2014-06-19 ENCOUNTER — Encounter (HOSPITAL_COMMUNITY): Payer: Self-pay | Admitting: Emergency Medicine

## 2014-06-19 ENCOUNTER — Emergency Department (HOSPITAL_COMMUNITY): Payer: Medicaid Other

## 2014-06-19 ENCOUNTER — Emergency Department (HOSPITAL_COMMUNITY)
Admission: EM | Admit: 2014-06-19 | Discharge: 2014-06-19 | Disposition: A | Discharge status: Home / Self Care | Payer: Medicaid Other | Attending: Emergency Medicine | Admitting: Emergency Medicine

## 2014-06-19 DIAGNOSIS — R52 Pain, unspecified: Secondary | ICD-10-CM | POA: Diagnosis not present

## 2014-06-19 DIAGNOSIS — M25531 Pain in right wrist: Secondary | ICD-10-CM

## 2014-06-19 DIAGNOSIS — E785 Hyperlipidemia, unspecified: Secondary | ICD-10-CM | POA: Insufficient documentation

## 2014-06-19 DIAGNOSIS — F17200 Nicotine dependence, unspecified, uncomplicated: Secondary | ICD-10-CM | POA: Diagnosis not present

## 2014-06-19 DIAGNOSIS — Z7982 Long term (current) use of aspirin: Secondary | ICD-10-CM | POA: Diagnosis not present

## 2014-06-19 DIAGNOSIS — M25549 Pain in joints of unspecified hand: Secondary | ICD-10-CM | POA: Diagnosis present

## 2014-06-19 DIAGNOSIS — I10 Essential (primary) hypertension: Secondary | ICD-10-CM | POA: Insufficient documentation

## 2014-06-19 DIAGNOSIS — J45909 Unspecified asthma, uncomplicated: Secondary | ICD-10-CM | POA: Diagnosis not present

## 2014-06-19 DIAGNOSIS — G43909 Migraine, unspecified, not intractable, without status migrainosus: Secondary | ICD-10-CM | POA: Insufficient documentation

## 2014-06-19 DIAGNOSIS — M25539 Pain in unspecified wrist: Secondary | ICD-10-CM | POA: Insufficient documentation

## 2014-06-19 DIAGNOSIS — I252 Old myocardial infarction: Secondary | ICD-10-CM | POA: Insufficient documentation

## 2014-06-19 DIAGNOSIS — Z79899 Other long term (current) drug therapy: Secondary | ICD-10-CM | POA: Insufficient documentation

## 2014-06-19 DIAGNOSIS — I2510 Atherosclerotic heart disease of native coronary artery without angina pectoris: Secondary | ICD-10-CM | POA: Diagnosis not present

## 2014-06-19 DIAGNOSIS — Z880 Allergy status to penicillin: Secondary | ICD-10-CM | POA: Diagnosis not present

## 2014-06-19 MED ORDER — PREDNISONE 10 MG PO TABS: ORAL_TABLET | ORAL | Status: DC

## 2014-06-19 MED ORDER — PREDNISONE 50 MG PO TABS
60.0000 mg | ORAL_TABLET | Freq: Once | ORAL | Status: AC
  Administered 2014-06-19: 60 mg via ORAL
  Filled 2014-06-19 (×2): qty 1

## 2014-06-19 NOTE — Discharge Instructions (Signed)
Wrist Pain °A wrist sprain happens when the bands of tissue that hold the wrist joints together (ligament) stretch too much or tear. A wrist strain happens when muscles or bands of tissue that connect muscles to bones (tendons) are stretched or pulled. °HOME CARE °· Put ice on the injured area. °¨ Put ice in a plastic bag. °¨ Place a towel between your skin and the bag. °¨ Leave the ice on for 15-20 minutes, 03-04 times a day, for the first 2 days. °· Raise (elevate) the injured wrist to lessen puffiness (swelling). °· Rest the injured wrist for at least 48 hours or as told by your doctor. °· Wear a splint, cast, or an elastic wrap as told by your doctor. °· Only take medicine as told by your doctor. °· Follow up with your doctor as told. This is important. °GET HELP RIGHT AWAY IF:  °· The fingers are puffy, very red, white, or cold and blue. °· The fingers lose feeling (numb) or tingle. °· The pain gets worse. °· It is hard to move the fingers. °MAKE SURE YOU:  °· Understand these instructions. °· Will watch your condition. °· Will get help right away if you are not doing well or get worse. °Document Released: 03/21/2008 Document Revised: 12/26/2011 Document Reviewed: 11/24/2010 °[employer]® Patient Information ©2015 [employer]. This information is not intended to replace advice given to you by your health care provider. Make sure you discuss any questions you have with your health care provider. ° °

## 2014-06-19 NOTE — ED Notes (Signed)
Pt reporting pain in right hand/wrist area.  Denies known injury.  Denies numbness or tingling.  Reports pain when attempting to grasp.

## 2014-06-21 NOTE — ED Provider Notes (Signed)
CSN: [id number]     Arrival date & time 06/19/14  2119 History   First MD Initiated Contact with Patient 06/19/14 2152     Chief Complaint  Patient presents with  . Hand Pain     (Consider location/radiation/quality/duration/timing/severity/associated sxs/prior Treatment) The history is provided by the patient.   Nathan Waters is a 33 y.o.right handed male presenting with right wrist pain which is constant, aching and worsened when he tries to grasp objects and when he pushes himself off the bed or couch, describing using a clenched fist approach getting up from a seated position.  He denies injury or overuse activities and denies prior episodes of similar symptoms. He denies weakness or numbness in his fingers.  He has taken ibuprofen 400 mg without relief of pain.     Past Medical History  Diagnosis Date  . Migraine   . STEMI (ST elevation myocardial infarction)     Complicated by VF arrest 11/2011  . Coronary atherosclerosis of native coronary artery     BMS to mid circumflex 11/2011, LVEF 55-60%  . Essential hypertension, benign   . Mixed hyperlipidemia   . Asthma    Past Surgical History  Procedure Laterality Date  . Splenectomy    . Back surgery     Family History  Problem Relation Age of Onset  . Malignant hyperthermia Mother   . Malignant hyperthermia Brother   . Heart disease Brother   . Heart disease Father    History  Substance Use Topics  . Smoking status: Current Every Day Smoker -- 0.50 packs/day    Types: Cigarettes  . Smokeless tobacco: Not on file  . Alcohol Use: 0.0 oz/week     Comment: Occasionally    Review of Systems  Constitutional: Negative for fever.  Musculoskeletal: Positive for arthralgias. Negative for joint swelling and myalgias.  Skin: Negative for color change.  Neurological: Negative for weakness and numbness.      Allergies  Penicillins  Home Medications   Prior to Admission medications   Medication Sig Start Date End  Date Taking? Authorizing Provider  aspirin 325 MG EC tablet Take 325 mg by mouth daily.   Yes Historical Provider, MD  metoprolol tartrate (LOPRESSOR) 25 MG tablet Take 25 mg by mouth 2 (two) times daily.   Yes Historical Provider, MD  nitroGLYCERIN (NITROSTAT) 0.4 MG SL tablet Place 0.4 mg under the tongue every 5 (five) minutes as needed for chest pain.   Yes Historical Provider, MD  pravastatin (PRAVACHOL) 40 MG tablet Take 40 mg by mouth at bedtime.   Yes Historical Provider, MD  predniSONE (DELTASONE) 10 MG tablet 6, 5, 4, 3, 2 then 1 tablet by mouth daily for 6 days total. 06/19/14   Evalee Jefferson, PA-C   BP 144/85  Pulse 77  Temp(Src) 98 F (36.7 C) (Oral)  Resp 20  Ht 6' (1.829 m)  Wt 260 lb (117.935 kg)  BMI 35.25 kg/m2  SpO2 97% Physical Exam  Constitutional: He appears well-developed and well-nourished.  HENT:  Head: Atraumatic.  Neck: Normal range of motion.  Cardiovascular:  Pulses equal bilaterally  Musculoskeletal: He exhibits tenderness.  ttp along right dorsal proximal hand and wrist.  No point tenderness, no edema or erythema, no rash.  No crepitus with palpation or ROM.  Forearm nontender.  Negative finkelstein.  Distal cap refill less than 3 seconds.  Neurological: He is alert. He has normal strength. He displays normal reflexes. No sensory deficit.  Skin: Skin is  warm and dry.  Psychiatric: He has a normal mood and affect.    ED Course  Procedures (including critical care time) Labs Review Labs Reviewed - No data to display  Imaging Review Dg Hand Complete Right  06/19/2014   CLINICAL DATA:  HAND PAIN  EXAM: RIGHT HAND - COMPLETE 3+ VIEW  COMPARISON:  01/30/2005  FINDINGS: There is no evidence of fracture or dislocation. There is no evidence of arthropathy or other focal bone abnormality. Soft tissues are unremarkable. No radiodense foreign body.  IMPRESSION: Negative.   Electronically Signed   By: Arne Cleveland M.D.   On: 06/19/2014 22:21     EKG  Interpretation None      MDM   Final diagnoses:  Wrist pain, acute, right    Patients labs and/or radiological studies were viewed and considered during the medical decision making and disposition process. Wrist and hand pain without known injury or overuse.  xrays negative.  Exam without suspicion of infection, gout, trauma.  Pt was placed in wrist splint to rest the area.  Prednisone taper, heat tx.  F/u with ortho in 1 week if sx not improved, referral given.    Evalee Jefferson, PA-C 06/21/14 1326

## 2014-06-22 NOTE — ED Provider Notes (Signed)
Medical screening examination/treatment/procedure(s) were performed by non-physician practitioner and as supervising physician I was immediately available for consultation/collaboration.   EKG Interpretation None        Francine Graven, DO 06/22/14 1626

## 2014-08-12 ENCOUNTER — Emergency Department (HOSPITAL_COMMUNITY)
Admission: EM | Admit: 2014-08-12 | Discharge: 2014-08-12 | Disposition: A | Discharge status: Home / Self Care | Payer: Medicaid Other | Attending: Emergency Medicine | Admitting: Emergency Medicine

## 2014-08-12 ENCOUNTER — Encounter (HOSPITAL_COMMUNITY): Payer: Self-pay | Admitting: Emergency Medicine

## 2014-08-12 DIAGNOSIS — J45909 Unspecified asthma, uncomplicated: Secondary | ICD-10-CM | POA: Diagnosis not present

## 2014-08-12 DIAGNOSIS — Z9889 Other specified postprocedural states: Secondary | ICD-10-CM | POA: Diagnosis not present

## 2014-08-12 DIAGNOSIS — M545 Low back pain, unspecified: Secondary | ICD-10-CM

## 2014-08-12 DIAGNOSIS — I10 Essential (primary) hypertension: Secondary | ICD-10-CM | POA: Diagnosis not present

## 2014-08-12 DIAGNOSIS — Z880 Allergy status to penicillin: Secondary | ICD-10-CM | POA: Insufficient documentation

## 2014-08-12 DIAGNOSIS — I2510 Atherosclerotic heart disease of native coronary artery without angina pectoris: Secondary | ICD-10-CM | POA: Insufficient documentation

## 2014-08-12 DIAGNOSIS — I252 Old myocardial infarction: Secondary | ICD-10-CM | POA: Diagnosis not present

## 2014-08-12 DIAGNOSIS — E782 Mixed hyperlipidemia: Secondary | ICD-10-CM | POA: Insufficient documentation

## 2014-08-12 MED ORDER — DIAZEPAM 5 MG PO TABS: 5.0000 mg | ORAL_TABLET | Freq: Four times a day (QID) | ORAL | Status: DC | PRN

## 2014-08-12 MED ORDER — HYDROCODONE-ACETAMINOPHEN 5-325 MG PO TABS: 2.0000 | ORAL_TABLET | Freq: Four times a day (QID) | ORAL | Status: DC | PRN

## 2014-08-12 MED ORDER — LORAZEPAM 1 MG PO TABS
1.0000 mg | ORAL_TABLET | Freq: Once | ORAL | Status: AC
  Administered 2014-08-12: 1 mg via ORAL
  Filled 2014-08-12: qty 1

## 2014-08-12 MED ORDER — HYDROCODONE-ACETAMINOPHEN 5-325 MG PO TABS
2.0000 | ORAL_TABLET | Freq: Once | ORAL | Status: AC
  Administered 2014-08-12: 2 via ORAL
  Filled 2014-08-12: qty 2

## 2014-08-12 NOTE — Discharge Instructions (Signed)
SEEK IMMEDIATE MEDICAL ATTENTION IF: New numbness, tingling, weakness, or problem with the use of your arms or legs.  Severe back pain not relieved with medications.  Change in bowel or bladder control.  Increasing pain in any areas of the body (such as chest or abdominal pain).  Shortness of breath, dizziness or fainting.  Nausea (feeling sick to your stomach), vomiting, fever, or sweats.  

## 2014-08-12 NOTE — ED Provider Notes (Signed)
CSN: [id number]     Arrival date & time 08/12/14  0041 History   First MD Initiated Contact with Patient 08/12/14 0046     Chief Complaint  Patient presents with  . Back Pain     (Consider location/radiation/quality/duration/timing/severity/associated sxs/prior Treatment) HPI 33 year old male with history of prior back surgery states over the last few months for a few minutes at a time and some numbness and tingling to both feet which is not an issue today however he states over the last week he has been helping a friend perform roofing and has been climbing lifting and twisting with heavy lifting and has a sore lower back for the last few days with constant moderately severe back pain worse with position changes over the last few days radiating to his buttocks bilaterally without radiation down his legs without change in bowel or bladder function without weakness or numbness or tingling to his legs or feet without trauma without fever without falls without abdominal pain shortness of breath chest pain or other concerns. He denies history of cancer, HIV, or IV drug use. He has tried Tylenol with minimal relief. He states he can feel his low back muscles spasm intermittently.  Past Medical History  Diagnosis Date  . Migraine   . STEMI (ST elevation myocardial infarction)     Complicated by VF arrest 11/2011  . Coronary atherosclerosis of native coronary artery     BMS to mid circumflex 11/2011, LVEF 55-60%  . Essential hypertension, benign   . Mixed hyperlipidemia   . Asthma    Past Surgical History  Procedure Laterality Date  . Splenectomy    . Back surgery     Family History  Problem Relation Age of Onset  . Malignant hyperthermia Mother   . Malignant hyperthermia Brother   . Heart disease Brother   . Heart disease Father    History  Substance Use Topics  . Smoking status: Current Every Day Smoker -- 0.50 packs/day    Types: Cigarettes  . Smokeless tobacco: Not on file  .  Alcohol Use: 0.0 oz/week     Comment: Occasionally    Review of Systems 10 Systems reviewed and are negative for acute change except as noted in the HPI.   Allergies  Penicillins  Home Medications   Prior to Admission medications   Medication Sig Start Date End Date Taking? Authorizing Provider  aspirin 325 MG EC tablet Take 325 mg by mouth daily.    Historical Provider, MD  diazepam (VALIUM) 5 MG tablet Take 1 tablet (5 mg total) by mouth every 6 (six) hours as needed for muscle spasms. 08/12/14   Babette Relic, MD  HYDROcodone-acetaminophen (NORCO) 5-325 MG per tablet Take 2 tablets by mouth every 6 (six) hours as needed for moderate pain or severe pain. 08/12/14   Babette Relic, MD  metoprolol tartrate (LOPRESSOR) 25 MG tablet Take 25 mg by mouth 2 (two) times daily.    Historical Provider, MD  nitroGLYCERIN (NITROSTAT) 0.4 MG SL tablet Place 0.4 mg under the tongue every 5 (five) minutes as needed for chest pain.    Historical Provider, MD  pravastatin (PRAVACHOL) 40 MG tablet Take 40 mg by mouth at bedtime.    Historical Provider, MD  predniSONE (DELTASONE) 10 MG tablet 6, 5, 4, 3, 2 then 1 tablet by mouth daily for 6 days total. 06/19/14   Evalee Jefferson, PA-C   BP 130/87 mmHg  Pulse 104  Temp(Src) 98.2 F (36.8 C) (Oral)  Resp 18  Ht 5\' 11"  (1.803 m)  Wt 231 lb (104.781 kg)  BMI 32.23 kg/m2  SpO2 95% Physical Exam  Nursing note and vitals reviewed. Constitutional:  Awake, alert, nontoxic appearance with baseline speech.  HENT:  Head: Atraumatic.  Eyes: Pupils are equal, round, and reactive to light. Right eye exhibits no discharge. Left eye exhibits no discharge.  Neck: Neck supple.  Cardiovascular: Normal rate and regular rhythm.   No murmur heard. Pulmonary/Chest: Effort normal and breath sounds normal. No respiratory distress. He has no wheezes. He has no rales. He exhibits no tenderness.  Abdominal: Soft. Bowel sounds are normal. He exhibits no mass. There is no  tenderness. There is no rebound.  Musculoskeletal: He exhibits tenderness.       Thoracic back: He exhibits no tenderness.       Lumbar back: He exhibits no tenderness.  Bilateral lower extremities non tender without new rashes or color change, baseline ROM with intact DP pulses, CR<2 secs all digits bilaterally, sensation baseline light touch bilaterally for pt, DTR's symmetric and intact bilaterally KJ / AJ, motor symmetric bilateral 5 / 5 hip flexion, quadriceps, hamstrings, EHL, foot dorsiflexion, foot plantarflexion, gait somewhat antalgic but without apparent new ataxia. He has diffuse lumbar and paralumbar tenderness without rash.  Neurological: He is alert.  Mental status baseline for patient.  Upper extremity motor strength and sensation intact and symmetric bilaterally.  Skin: No rash noted.  Psychiatric: He has a normal mood and affect.    ED Course  Procedures (including critical care time) Labs Review Labs Reviewed - No data to display  Imaging Review No results found.   EKG Interpretation None      MDM   Final diagnoses:  Acute low back pain    Patient / Family / Caregiver informed of clinical course, understand medical decision-making process, and agree with plan. I doubt any other EMC precluding discharge at this time including, but not necessarily limited to the following:cauda equina syndrome, SBI.    Babette Relic, MD 08/17/14 [phone]

## 2014-08-12 NOTE — ED Notes (Signed)
Pt reports pain to lower back that started in past couple of days, states he has been helping to do some roofing last week, and feels it is worse since then

## 2014-09-25 ENCOUNTER — Encounter (HOSPITAL_COMMUNITY): Payer: Self-pay | Admitting: Cardiology

## 2015-04-01 ENCOUNTER — Emergency Department (HOSPITAL_COMMUNITY): Payer: Medicaid Other

## 2015-04-01 ENCOUNTER — Encounter (HOSPITAL_COMMUNITY): Payer: Self-pay

## 2015-04-01 ENCOUNTER — Emergency Department (HOSPITAL_COMMUNITY)
Admission: EM | Admit: 2015-04-01 | Discharge: 2015-04-01 | Disposition: A | Discharge status: Home / Self Care | Payer: Medicaid Other | Attending: Emergency Medicine | Admitting: Emergency Medicine

## 2015-04-01 DIAGNOSIS — G43909 Migraine, unspecified, not intractable, without status migrainosus: Secondary | ICD-10-CM | POA: Diagnosis not present

## 2015-04-01 DIAGNOSIS — Z9889 Other specified postprocedural states: Secondary | ICD-10-CM | POA: Insufficient documentation

## 2015-04-01 DIAGNOSIS — J45909 Unspecified asthma, uncomplicated: Secondary | ICD-10-CM | POA: Insufficient documentation

## 2015-04-01 DIAGNOSIS — I252 Old myocardial infarction: Secondary | ICD-10-CM | POA: Insufficient documentation

## 2015-04-01 DIAGNOSIS — X58XXXA Exposure to other specified factors, initial encounter: Secondary | ICD-10-CM | POA: Insufficient documentation

## 2015-04-01 DIAGNOSIS — Z720 Tobacco use: Secondary | ICD-10-CM | POA: Diagnosis not present

## 2015-04-01 DIAGNOSIS — I2510 Atherosclerotic heart disease of native coronary artery without angina pectoris: Secondary | ICD-10-CM | POA: Insufficient documentation

## 2015-04-01 DIAGNOSIS — Z7982 Long term (current) use of aspirin: Secondary | ICD-10-CM | POA: Insufficient documentation

## 2015-04-01 DIAGNOSIS — S46812A Strain of other muscles, fascia and tendons at shoulder and upper arm level, left arm, initial encounter: Secondary | ICD-10-CM | POA: Insufficient documentation

## 2015-04-01 DIAGNOSIS — Y999 Unspecified external cause status: Secondary | ICD-10-CM | POA: Diagnosis not present

## 2015-04-01 DIAGNOSIS — Z880 Allergy status to penicillin: Secondary | ICD-10-CM | POA: Insufficient documentation

## 2015-04-01 DIAGNOSIS — Y929 Unspecified place or not applicable: Secondary | ICD-10-CM | POA: Insufficient documentation

## 2015-04-01 DIAGNOSIS — I10 Essential (primary) hypertension: Secondary | ICD-10-CM | POA: Diagnosis not present

## 2015-04-01 DIAGNOSIS — E782 Mixed hyperlipidemia: Secondary | ICD-10-CM | POA: Diagnosis not present

## 2015-04-01 DIAGNOSIS — Y939 Activity, unspecified: Secondary | ICD-10-CM | POA: Insufficient documentation

## 2015-04-01 DIAGNOSIS — S4992XA Unspecified injury of left shoulder and upper arm, initial encounter: Secondary | ICD-10-CM | POA: Diagnosis present

## 2015-04-01 MED ORDER — TRAMADOL HCL 50 MG PO TABS: 50.0000 mg | ORAL_TABLET | Freq: Four times a day (QID) | ORAL | Status: DC | PRN

## 2015-04-01 NOTE — Discharge Instructions (Signed)
Follow up with a family md or dr. Aline Waters if shoulder does not improve

## 2015-04-01 NOTE — ED Notes (Signed)
Pt c/o pain in r shoulder blade, worse with deep breathing or moving.  Denies injury.  Denies cough or congestion.

## 2015-04-01 NOTE — ED Provider Notes (Signed)
CSN: [id number]     Arrival date & time 04/01/15  1712 History   First MD Initiated Contact with Patient 04/01/15 1727     Chief Complaint  Patient presents with  . Shoulder Pain     (Consider location/radiation/quality/duration/timing/severity/associated sxs/prior Treatment) Patient is a 34 y.o. male presenting with shoulder pain. The history is provided by the patient (the pt complains of pain to upper back and shoulder).  Shoulder Pain Upper extremity pain location: right trapezius muscle. Pain details:    Quality:  Aching   Radiates to:  Does not radiate   Severity:  Moderate   Onset quality:  Sudden   Timing:  Intermittent Chronicity:  New Associated symptoms: back pain   Associated symptoms: no fatigue     Past Medical History  Diagnosis Date  . Migraine   . STEMI (ST elevation myocardial infarction)     Complicated by VF arrest 11/2011  . Coronary atherosclerosis of native coronary artery     BMS to mid circumflex 11/2011, LVEF 55-60%  . Essential hypertension, benign   . Mixed hyperlipidemia   . Asthma    Past Surgical History  Procedure Laterality Date  . Splenectomy    . Back surgery    . Left heart catheterization with coronary angiogram N/A 11/21/2011    Procedure: LEFT HEART CATHETERIZATION WITH CORONARY ANGIOGRAM;  Surgeon: Peter M Martinique, MD;  Location: [facility] CATH LAB;  Service: Cardiovascular;  Laterality: N/A;  . Percutaneous coronary stent intervention (pci-s) N/A 11/21/2011    Procedure: PERCUTANEOUS CORONARY STENT INTERVENTION (PCI-S);  Surgeon: Peter M Martinique, MD;  Location: [facility] CATH LAB;  Service: Cardiovascular;  Laterality: N/A;   Family History  Problem Relation Age of Onset  . Malignant hyperthermia Mother   . Malignant hyperthermia Brother   . Heart disease Brother   . Heart disease Father    History  Substance Use Topics  . Smoking status: Current Every Day Smoker -- 0.50 packs/day    Types: Cigarettes  . Smokeless tobacco: Not on file  .  Alcohol Use: 0.0 oz/week     Comment: Occasionally    Review of Systems  Constitutional: Negative for appetite change and fatigue.  HENT: Negative for congestion, ear discharge and sinus pressure.   Eyes: Negative for discharge.  Respiratory: Negative for cough.   Cardiovascular: Negative for chest pain.  Gastrointestinal: Negative for abdominal pain and diarrhea.  Genitourinary: Negative for frequency and hematuria.  Musculoskeletal: Positive for back pain.  Skin: Negative for rash.  Neurological: Negative for seizures and headaches.  Psychiatric/Behavioral: Negative for hallucinations.      Allergies  Penicillins  Home Medications   Prior to Admission medications   Medication Sig Start Date End Date Taking? Authorizing Provider  aspirin 325 MG EC tablet Take 325 mg by mouth daily.   Yes Historical Provider, MD  diazepam (VALIUM) 5 MG tablet Take 1 tablet (5 mg total) by mouth every 6 (six) hours as needed for muscle spasms. Patient not taking: Reported on 04/01/2015 08/12/14   Riki Altes, MD  HYDROcodone-acetaminophen [facility]) 5-325 MG per tablet Take 2 tablets by mouth every 6 (six) hours as needed for moderate pain or severe pain. Patient not taking: Reported on 04/01/2015 08/12/14   Riki Altes, MD  metoprolol tartrate (LOPRESSOR) 25 MG tablet Take 25 mg by mouth 2 (two) times daily.    Historical Provider, MD  nitroGLYCERIN (NITROSTAT) 0.4 MG SL tablet Place 0.4 mg under the tongue every 5 (five) minutes as needed for  chest pain.    Historical Provider, MD  pravastatin (PRAVACHOL) 40 MG tablet Take 40 mg by mouth at bedtime.    Historical Provider, MD  predniSONE (DELTASONE) 10 MG tablet 6, 5, 4, 3, 2 then 1 tablet by mouth daily for 6 days total. Patient not taking: Reported on 04/01/2015 06/19/14   Evalee Jefferson, PA-C  traMADol (ULTRAM) 50 MG tablet Take 1 tablet (50 mg total) by mouth every 6 (six) hours as needed. 04/01/15   Milton Ferguson, MD   BP 142/85 mmHg  Pulse 57   Temp(Src) 98 F (36.7 C) (Oral)  Resp 18  Ht 6' (1.829 m)  Wt 240 lb (108.863 kg)  BMI 32.54 kg/m2  SpO2 94% Physical Exam  Constitutional: He is oriented to person, place, and time. He appears well-developed.  HENT:  Head: Normocephalic.  Eyes: Conjunctivae and EOM are normal. No scleral icterus.  Neck: Neck supple. No thyromegaly present.  Cardiovascular: Normal rate and regular rhythm.  Exam reveals no gallop and no friction rub.   No murmur heard. Pulmonary/Chest: No stridor. He has no wheezes. He has no rales. He exhibits no tenderness.  Abdominal: He exhibits no distension. There is no tenderness. There is no rebound.  Musculoskeletal: Normal range of motion. He exhibits no edema.  Tender right trapezius muscle,  Worse with movement  Lymphadenopathy:    He has no cervical adenopathy.  Neurological: He is oriented to person, place, and time. He exhibits normal muscle tone. Coordination normal.  Skin: No rash noted. No erythema.  Psychiatric: He has a normal mood and affect. His behavior is normal.    ED Course  Procedures (including critical care time) Labs Review Labs Reviewed - No data to display  Imaging Review Dg Chest 2 View  04/01/2015   CLINICAL DATA:  Severe chest and right shoulder pain with deep inspiration.  EXAM: CHEST  2 VIEW  COMPARISON:  04/18/2014  FINDINGS: The heart size and mediastinal contours are within normal limits. Both lungs are clear. The visualized skeletal structures are unremarkable.  IMPRESSION: Normal chest x-ray.   Electronically Signed   By: Marijo Sanes M.D.   On: 04/01/2015 17:57     EKG Interpretation None      MDM   Final diagnoses:  Trapezius muscle strain, left, initial encounter    Muscle strain,  tx with motrin and ultram and follow up    Milton Ferguson, MD 04/01/15 [phone]

## 2015-07-31 ENCOUNTER — Emergency Department (HOSPITAL_COMMUNITY)
Admission: EM | Admit: 2015-07-31 | Discharge: 2015-07-31 | Disposition: A | Discharge status: Home / Self Care | Payer: Medicaid Other | Attending: Emergency Medicine | Admitting: Emergency Medicine

## 2015-07-31 ENCOUNTER — Encounter (HOSPITAL_COMMUNITY): Payer: Self-pay | Admitting: Emergency Medicine

## 2015-07-31 DIAGNOSIS — E782 Mixed hyperlipidemia: Secondary | ICD-10-CM | POA: Insufficient documentation

## 2015-07-31 DIAGNOSIS — Z720 Tobacco use: Secondary | ICD-10-CM | POA: Diagnosis not present

## 2015-07-31 DIAGNOSIS — Z79899 Other long term (current) drug therapy: Secondary | ICD-10-CM | POA: Insufficient documentation

## 2015-07-31 DIAGNOSIS — J45909 Unspecified asthma, uncomplicated: Secondary | ICD-10-CM | POA: Insufficient documentation

## 2015-07-31 DIAGNOSIS — Z880 Allergy status to penicillin: Secondary | ICD-10-CM | POA: Diagnosis not present

## 2015-07-31 DIAGNOSIS — R21 Rash and other nonspecific skin eruption: Secondary | ICD-10-CM

## 2015-07-31 DIAGNOSIS — Z7982 Long term (current) use of aspirin: Secondary | ICD-10-CM | POA: Insufficient documentation

## 2015-07-31 DIAGNOSIS — I10 Essential (primary) hypertension: Secondary | ICD-10-CM | POA: Diagnosis not present

## 2015-07-31 DIAGNOSIS — I252 Old myocardial infarction: Secondary | ICD-10-CM | POA: Diagnosis not present

## 2015-07-31 DIAGNOSIS — Z9889 Other specified postprocedural states: Secondary | ICD-10-CM | POA: Insufficient documentation

## 2015-07-31 DIAGNOSIS — I2510 Atherosclerotic heart disease of native coronary artery without angina pectoris: Secondary | ICD-10-CM | POA: Insufficient documentation

## 2015-07-31 MED ORDER — FLUCONAZOLE 200 MG PO TABS: 200.0000 mg | ORAL_TABLET | Freq: Every day | ORAL | Status: DC

## 2015-07-31 MED ORDER — PREDNISONE 20 MG PO TABS: ORAL_TABLET | ORAL | Status: DC

## 2015-07-31 NOTE — ED Provider Notes (Signed)
CSN: [id number]     Arrival date & time 07/31/15  0044 History   First MD Initiated Contact with Patient 07/31/15 0100     Chief Complaint  Patient presents with  . Rash     (Consider location/radiation/quality/duration/timing/severity/associated sxs/prior Treatment) Patient is a 34 y.o. male presenting with rash.  Rash Location:  Torso Torso rash location:  Abd LLQ, abd RLQ and lower back Quality: dryness, redness and scaling   Quality: not itchy, not painful and not peeling   Severity:  Mild Onset quality:  Gradual Duration:  1 month Timing:  Constant Progression:  Worsening Chronicity:  New Context: not animal contact, not chemical exposure, not eggs and not exposure to similar rash   Relieved by:  None tried Worsened by:  Nothing tried Ineffective treatments:  None tried Associated symptoms: no abdominal pain, no diarrhea, no fever, no nausea, no shortness of breath, not vomiting and not wheezing     Past Medical History  Diagnosis Date  . Migraine   . STEMI (ST elevation myocardial infarction) ([place])     Complicated by VF arrest 11/2011  . Coronary atherosclerosis of native coronary artery     BMS to mid circumflex 11/2011, LVEF 55-60%  . Essential hypertension, benign   . Mixed hyperlipidemia   . Asthma    Past Surgical History  Procedure Laterality Date  . Splenectomy    . Back surgery    . Left heart catheterization with coronary angiogram N/A 11/21/2011    Procedure: LEFT HEART CATHETERIZATION WITH CORONARY ANGIOGRAM;  Surgeon: Peter M Martinique, MD;  Location: [facility] CATH LAB;  Service: Cardiovascular;  Laterality: N/A;  . Percutaneous coronary stent intervention (pci-s) N/A 11/21/2011    Procedure: PERCUTANEOUS CORONARY STENT INTERVENTION (PCI-S);  Surgeon: Peter M Martinique, MD;  Location: [facility] CATH LAB;  Service: Cardiovascular;  Laterality: N/A;   Family History  Problem Relation Age of Onset  . Malignant hyperthermia Mother   . Malignant hyperthermia Brother   .  Heart disease Brother   . Heart disease Father    Social History  Substance Use Topics  . Smoking status: Current Every Day Smoker -- 1.00 packs/day    Types: Cigarettes  . Smokeless tobacco: None  . Alcohol Use: 0.0 oz/week     Comment: Occasionally    Review of Systems  Constitutional: Negative for fever, chills, activity change and appetite change.  HENT: Negative for congestion, drooling, ear discharge and ear pain.   Respiratory: Negative for shortness of breath and wheezing.   Cardiovascular: Negative for chest pain and leg swelling.  Gastrointestinal: Negative for nausea, vomiting, abdominal pain and diarrhea.  Endocrine: Negative for polydipsia and polyuria.  Skin: Positive for rash.      Allergies  Penicillins  Home Medications   Prior to Admission medications   Medication Sig Start Date End Date Taking? Authorizing Provider  aspirin 325 MG EC tablet Take 325 mg by mouth daily.    Historical Provider, MD  fluconazole (DIFLUCAN) 200 MG tablet Take 1 tablet (200 mg total) by mouth daily. 07/31/15   Merrily Pew, MD  metoprolol tartrate (LOPRESSOR) 25 MG tablet Take 25 mg by mouth 2 (two) times daily.    Historical Provider, MD  nitroGLYCERIN (NITROSTAT) 0.4 MG SL tablet Place 0.4 mg under the tongue every 5 (five) minutes as needed for chest pain.    Historical Provider, MD  pravastatin (PRAVACHOL) 40 MG tablet Take 40 mg by mouth at bedtime.    Historical Provider, MD  predniSONE (  DELTASONE) 20 MG tablet 3 tabs po daily x 3 days, then 2 tabs x 3 days, then 1.5 tabs x 3 days, then 1 tab x 3 days, then 0.5 tabs x 3 days 07/31/15   Merrily Pew, MD  traMADol (ULTRAM) 50 MG tablet Take 1 tablet (50 mg total) by mouth every 6 (six) hours as needed. 04/01/15   Milton Ferguson, MD   BP 129/88 mmHg  Pulse 102  Temp(Src) 98.3 F (36.8 C) (Oral)  Resp 18  Ht 6' (1.829 m)  Wt 270 lb (122.471 kg)  BMI 36.61 kg/m2  SpO2 100% Physical Exam  Constitutional: He is oriented to  person, place, and time. He appears well-developed and well-nourished.  HENT:  Head: Normocephalic and atraumatic.  Eyes: Conjunctivae and EOM are normal.  Neck: Normal range of motion. Neck supple.  Cardiovascular: Normal rate and regular rhythm.   Pulmonary/Chest: Effort normal. No respiratory distress.  Abdominal: Soft. There is no tenderness.  Musculoskeletal: Normal range of motion. He exhibits no edema or tenderness.  Neurological: He is alert and oriented to person, place, and time.  Skin: Skin is warm and dry. Rash noted. No purpura noted. Rash is not nodular, not pustular, not vesicular and not urticarial.  Nursing note and vitals reviewed.   ED Course  Procedures (including critical care time) Labs Review Labs Reviewed - No data to display  Imaging Review No results found. I have personally reviewed and evaluated these images and lab results as part of my medical decision-making.   EKG Interpretation None      MDM   Final diagnoses:  Rash    DDx for rash is broad and includes common causes of atopic dermatitis, contact dermatitis, drug eruption, erythema multiforme, fifth disease, psoriasis, insect bites, eczema, pityriasis rosea, roseola, scabies, tinea corporis, urticaria, varicella, viral exanthem. However I feel this patient's rash is most consistent with likely tinea corpora. He has no signs of the more emergent causes of rashes. It is diffuse so we'll treat with systemic antifungals and have reevaluation in 5 days if not improving.   Merrily Pew, MD 07/31/15 [phone]

## 2015-07-31 NOTE — ED Notes (Signed)
Pt stated that has a red -dry patchy rash all over his body-- started on lower extremeties and this past couple of days it has worked it's way up body

## 2015-08-25 ENCOUNTER — Encounter (HOSPITAL_COMMUNITY): Payer: Self-pay | Admitting: Emergency Medicine

## 2015-08-25 ENCOUNTER — Emergency Department (HOSPITAL_COMMUNITY)
Admission: EM | Admit: 2015-08-25 | Discharge: 2015-08-25 | Disposition: A | Discharge status: Home / Self Care | Payer: Medicaid Other | Attending: Emergency Medicine | Admitting: Emergency Medicine

## 2015-08-25 DIAGNOSIS — L0211 Cutaneous abscess of neck: Secondary | ICD-10-CM | POA: Insufficient documentation

## 2015-08-25 DIAGNOSIS — E782 Mixed hyperlipidemia: Secondary | ICD-10-CM | POA: Diagnosis not present

## 2015-08-25 DIAGNOSIS — L0291 Cutaneous abscess, unspecified: Secondary | ICD-10-CM

## 2015-08-25 DIAGNOSIS — I10 Essential (primary) hypertension: Secondary | ICD-10-CM | POA: Diagnosis not present

## 2015-08-25 DIAGNOSIS — Z7982 Long term (current) use of aspirin: Secondary | ICD-10-CM | POA: Insufficient documentation

## 2015-08-25 DIAGNOSIS — J45909 Unspecified asthma, uncomplicated: Secondary | ICD-10-CM | POA: Insufficient documentation

## 2015-08-25 DIAGNOSIS — I252 Old myocardial infarction: Secondary | ICD-10-CM | POA: Insufficient documentation

## 2015-08-25 DIAGNOSIS — I2510 Atherosclerotic heart disease of native coronary artery without angina pectoris: Secondary | ICD-10-CM | POA: Insufficient documentation

## 2015-08-25 DIAGNOSIS — Z880 Allergy status to penicillin: Secondary | ICD-10-CM | POA: Diagnosis not present

## 2015-08-25 DIAGNOSIS — Z720 Tobacco use: Secondary | ICD-10-CM | POA: Diagnosis not present

## 2015-08-25 DIAGNOSIS — Z79899 Other long term (current) drug therapy: Secondary | ICD-10-CM | POA: Diagnosis not present

## 2015-08-25 MED ORDER — SULFAMETHOXAZOLE-TRIMETHOPRIM 800-160 MG PO TABS: 1.0000 | ORAL_TABLET | Freq: Two times a day (BID) | ORAL | Status: AC

## 2015-08-25 MED ORDER — POVIDONE-IODINE 10 % EX SOLN
CUTANEOUS | Status: AC
  Administered 2015-08-25: 04:00:00
  Filled 2015-08-25: qty 118

## 2015-08-25 MED ORDER — LIDOCAINE HCL (PF) 1 % IJ SOLN
INTRAMUSCULAR | Status: AC
  Administered 2015-08-25: 04:00:00
  Filled 2015-08-25: qty 5

## 2015-08-25 MED ORDER — SULFAMETHOXAZOLE-TRIMETHOPRIM 800-160 MG PO TABS
1.0000 | ORAL_TABLET | Freq: Once | ORAL | Status: AC
  Administered 2015-08-25: 1 via ORAL
  Filled 2015-08-25: qty 1

## 2015-08-25 MED ORDER — HYDROCODONE-ACETAMINOPHEN 5-325 MG PO TABS
1.0000 | ORAL_TABLET | Freq: Once | ORAL | Status: AC
  Administered 2015-08-25: 1 via ORAL
  Filled 2015-08-25: qty 1

## 2015-08-25 MED ORDER — HYDROCODONE-ACETAMINOPHEN 5-325 MG PO TABS
1.0000 | ORAL_TABLET | Freq: Four times a day (QID) | ORAL | Status: DC | PRN
Start: 2015-08-25 — End: 2015-10-07

## 2015-08-25 NOTE — ED Provider Notes (Signed)
TIME SEEN: 3:40 AM  CHIEF COMPLAINT: Abscess  HPI: Pt is a 34 y.o. male with history of hypertension, hyperlipidemia, coronary artery disease who presents to the emergency department with an abscess to his right neck for the past 2-3 days. No drainage. States it is painful to palpate and makes it painful to bend his neck to the side. No fever. He is not a diabetic. Denies any IV drug use.  ROS: See HPI Constitutional: no fever  Eyes: no drainage  ENT: no runny nose   Cardiovascular:  no chest pain  Resp: no SOB  GI: no vomiting GU: no dysuria Integumentary: no rash  Allergy: no hives  Musculoskeletal: no leg swelling  Neurological: no slurred speech ROS otherwise negative  PAST MEDICAL HISTORY/PAST SURGICAL HISTORY:  Past Medical History  Diagnosis Date  . Migraine   . STEMI (ST elevation myocardial infarction) ([place])     Complicated by VF arrest 11/2011  . Coronary atherosclerosis of native coronary artery     BMS to mid circumflex 11/2011, LVEF 55-60%  . Essential hypertension, benign   . Mixed hyperlipidemia   . Asthma     MEDICATIONS:  Prior to Admission medications   Medication Sig Start Date End Date Taking? Authorizing Provider  aspirin 325 MG EC tablet Take 325 mg by mouth daily.    Historical Provider, MD  metoprolol tartrate (LOPRESSOR) 25 MG tablet Take 25 mg by mouth 2 (two) times daily.    Historical Provider, MD  nitroGLYCERIN (NITROSTAT) 0.4 MG SL tablet Place 0.4 mg under the tongue every 5 (five) minutes as needed for chest pain.    Historical Provider, MD  pravastatin (PRAVACHOL) 40 MG tablet Take 40 mg by mouth at bedtime.    Historical Provider, MD  predniSONE (DELTASONE) 20 MG tablet 3 tabs po daily x 3 days, then 2 tabs x 3 days, then 1.5 tabs x 3 days, then 1 tab x 3 days, then 0.5 tabs x 3 days 07/31/15   Merrily Pew, MD  traMADol (ULTRAM) 50 MG tablet Take 1 tablet (50 mg total) by mouth every 6 (six) hours as needed. 04/01/15   Milton Ferguson, MD     ALLERGIES:  Allergies  Allergen Reactions  . Penicillins Anaphylaxis    SOCIAL HISTORY:  Social History  Substance Use Topics  . Smoking status: Current Every Day Smoker -- 1.00 packs/day    Types: Cigarettes  . Smokeless tobacco: Not on file  . Alcohol Use: 0.0 oz/week     Comment: Occasionally    FAMILY HISTORY: Family History  Problem Relation Age of Onset  . Malignant hyperthermia Mother   . Malignant hyperthermia Brother   . Heart disease Brother   . Heart disease Father     EXAM: BP 133/77 mmHg  Pulse 71  Temp(Src) 97.8 F (36.6 C) (Oral)  Resp 18  Ht 6' (1.829 m)  Wt 260 lb (117.935 kg)  BMI 35.25 kg/m2  SpO2 95% CONSTITUTIONAL: Alert and oriented and responds appropriately to questions. Well-appearing; well-nourished HEAD: Normocephalic EYES: Conjunctivae clear, PERRL ENT: normal nose; no rhinorrhea; moist mucous membranes; pharynx without lesions noted, no tonsillar hypertrophy or exudate, no uvular deviation, no trismus or drooling, normal phonation, no stridor, no dental caries or abscess noted, no Ludwig's angina, tongue sits flat in the bottom of the mouth NECK: Supple, no meningismus, no LAD  CARD: RRR; S1 and S2 appreciated; no murmurs, no clicks, no rubs, no gallops RESP: Normal chest excursion without splinting or tachypnea; breath  sounds clear and equal bilaterally; no wheezes, no rhonchi, no rales, no hypoxia or respiratory distress, speaking full sentences ABD/GI: Normal bowel sounds; non-distended; soft, non-tender, no rebound, no guarding, no peritoneal signs BACK:  The back appears normal and is non-tender to palpation, there is no CVA tenderness EXT: Normal ROM in all joints; non-tender to palpation; no edema; normal capillary refill; no cyanosis, no calf tenderness or swelling    SKIN: Normal color for age and race; warm; 2 x 3 fluctuant area with erythema and warmth to the right lateral neck that appear superficial with no active  drainage NEURO: Moves all extremities equally, sensation to light touch intact diffusely, cranial nerves II through XII intact PSYCH: The patient's mood and manner are appropriate. Grooming and personal hygiene are appropriate.  MEDICAL DECISION MAKING: Patient here with a superficial abscess to the right neck. Abscess easily drained with a moderate amount of purulent drainage. Abscess was very superficial on exam. I do not feel he needs any imaging. Minimal surrounding cellulitis so will discharge patient on Bactrim. We'll also discharge with pain medication.  I do not feel there is any life-threatening condition present. Discussed all results, exam findings with patient. I feel the patient is safe to be discharged home without further emergent workup. Discussed usual and customary return precautions. Patient and family (if present) verbalize understanding and are comfortable with this plan.  Patient will follow-up with their primary care provider. If they do not have a primary care provider, information for follow-up has been provided to them. All questions have been answered.       INCISION AND DRAINAGE Performed by: Nyra Jabs Consent: Verbal consent obtained. Risks and benefits: risks, benefits and alternatives were discussed Type: abscess  Body area: Right neck  Anesthesia: local infiltration  Incision was made with a scalpel.  Local anesthetic: lidocaine 1 % without epinephrine  Anesthetic total: 4 ml  Complexity: complex Superficial Blunt dissection to break up loculations  Drainage: purulent  Drainage amount: moderate  Patient tolerance: Patient tolerated the procedure well with no immediate complications.        [place], DO 08/25/15 [phone]

## 2015-08-25 NOTE — Discharge Instructions (Signed)
Abscess °An abscess is an infected area that contains a collection of pus and debris. It can occur in almost any part of the body. An abscess is also known as a furuncle or boil. °CAUSES  °An abscess occurs when tissue gets infected. This can occur from blockage of oil or sweat glands, infection of hair follicles, or a minor injury to the skin. As the body tries to fight the infection, pus collects in the area and creates pressure under the skin. This pressure causes pain. People with weakened immune systems have difficulty fighting infections and get certain abscesses more often.  °SYMPTOMS °Usually an abscess develops on the skin and becomes a painful mass that is red, warm, and tender. If the abscess forms under the skin, you may feel a moveable soft area under the skin. Some abscesses break open (rupture) on their own, but most will continue to get worse without care. The infection can spread deeper into the body and eventually into the bloodstream, causing you to feel ill.  °DIAGNOSIS  °Your caregiver will take your medical history and perform a physical exam. A sample of fluid may also be taken from the abscess to determine what is causing your infection. °TREATMENT  °Your caregiver may prescribe antibiotic medicines to fight the infection. However, taking antibiotics alone usually does not cure an abscess. Your caregiver may need to make a small cut (incision) in the abscess to drain the pus. In some cases, gauze is packed into the abscess to reduce pain and to continue draining the area. °HOME CARE INSTRUCTIONS  °· Only take over-the-counter or prescription medicines for pain, discomfort, or fever as directed by your caregiver. °· If you were prescribed antibiotics, take them as directed. Finish them even if you start to feel better. °· If gauze is used, follow your caregiver's directions for changing the gauze. °· To avoid spreading the infection: °· Keep your draining abscess covered with a  bandage. °· Wash your hands well. °· Do not share personal care items, towels, or whirlpools with others. °· Avoid skin contact with others. °· Keep your skin and clothes clean around the abscess. °· Keep all follow-up appointments as directed by your caregiver. °SEEK MEDICAL CARE IF:  °· You have increased pain, swelling, redness, fluid drainage, or bleeding. °· You have muscle aches, chills, or a general ill feeling. °· You have a fever. °MAKE SURE YOU:  °· Understand these instructions. °· Will watch your condition. °· Will get help right away if you are not doing well or get worse. °  °This information is not intended to replace advice given to you by your health care provider. Make sure you discuss any questions you have with your health care provider. °  °Document Released: 07/13/2005 Document Revised: 04/03/2012 Document Reviewed: 12/16/2011 °[employer] Interactive Patient Education ©2016 [employer] ° °Incision and Drainage °Incision and drainage is a procedure in which a sac-like structure (cystic structure) is opened and drained. The area to be drained usually contains material such as pus, fluid, or blood.  °LET YOUR CAREGIVER KNOW ABOUT:  °· Allergies to medicine. °· Medicines taken, including vitamins, herbs, eyedrops, over-the-counter medicines, and creams. °· Use of steroids (by mouth or creams). °· Previous problems with anesthetics or numbing medicines. °· History of bleeding problems or blood clots. °· Previous surgery. °· Other health problems, including diabetes and kidney problems. °· Possibility of pregnancy, if this applies. °RISKS AND COMPLICATIONS °· Pain. °· Bleeding. °· Scarring. °· Infection. °BEFORE THE PROCEDURE  °  You may need to have an ultrasound or other imaging tests to see how large or deep your cystic structure is. Blood tests may also be used to determine if you have an infection or how severe the infection is. You may need to have a tetanus shot. °PROCEDURE  °The affected area  is cleaned with a cleaning fluid. The cyst area will then be numbed with a medicine (local anesthetic). A small incision will be made in the cystic structure. A syringe or catheter may be used to drain the contents of the cystic structure, or the contents may be squeezed out. The area will then be flushed with a cleansing solution. After cleansing the area, it is often gently packed with a gauze or another wound dressing. Once it is packed, it will be covered with gauze and tape or some other type of wound dressing.  °AFTER THE PROCEDURE  °· Often, you will be allowed to go home right after the procedure. °· You may be given antibiotic medicine to prevent or heal an infection. °· If the area was packed with gauze or some other wound dressing, you will likely need to come back in 1 to 2 days to get it removed. °· The area should heal in about 14 days. °  °This information is not intended to replace advice given to you by your health care provider. Make sure you discuss any questions you have with your health care provider. °  °Document Released: 03/29/2001 Document Revised: 04/03/2012 Document Reviewed: 11/28/2011 °[employer] Interactive Patient Education ©2016 [employer] ° °

## 2015-08-25 NOTE — ED Notes (Signed)
Pt states that he noticed an "abscess or infected bug bite" 2-3 days ago and that now it is very painful and makes it difficult to turn his neck.

## 2015-10-07 ENCOUNTER — Encounter (HOSPITAL_COMMUNITY): Payer: Self-pay | Admitting: Emergency Medicine

## 2015-10-07 ENCOUNTER — Emergency Department (HOSPITAL_COMMUNITY)
Admission: EM | Admit: 2015-10-07 | Discharge: 2015-10-07 | Disposition: A | Discharge status: Home / Self Care | Payer: Medicaid Other | Attending: Emergency Medicine | Admitting: Emergency Medicine

## 2015-10-07 DIAGNOSIS — R200 Anesthesia of skin: Secondary | ICD-10-CM | POA: Insufficient documentation

## 2015-10-07 DIAGNOSIS — E782 Mixed hyperlipidemia: Secondary | ICD-10-CM | POA: Insufficient documentation

## 2015-10-07 DIAGNOSIS — I252 Old myocardial infarction: Secondary | ICD-10-CM | POA: Diagnosis not present

## 2015-10-07 DIAGNOSIS — Z7982 Long term (current) use of aspirin: Secondary | ICD-10-CM | POA: Insufficient documentation

## 2015-10-07 DIAGNOSIS — S39012A Strain of muscle, fascia and tendon of lower back, initial encounter: Secondary | ICD-10-CM | POA: Insufficient documentation

## 2015-10-07 DIAGNOSIS — Y998 Other external cause status: Secondary | ICD-10-CM | POA: Diagnosis not present

## 2015-10-07 DIAGNOSIS — Z79899 Other long term (current) drug therapy: Secondary | ICD-10-CM | POA: Insufficient documentation

## 2015-10-07 DIAGNOSIS — I2510 Atherosclerotic heart disease of native coronary artery without angina pectoris: Secondary | ICD-10-CM | POA: Diagnosis not present

## 2015-10-07 DIAGNOSIS — Y9289 Other specified places as the place of occurrence of the external cause: Secondary | ICD-10-CM | POA: Diagnosis not present

## 2015-10-07 DIAGNOSIS — Z9889 Other specified postprocedural states: Secondary | ICD-10-CM | POA: Diagnosis not present

## 2015-10-07 DIAGNOSIS — Y9389 Activity, other specified: Secondary | ICD-10-CM | POA: Diagnosis not present

## 2015-10-07 DIAGNOSIS — F17210 Nicotine dependence, cigarettes, uncomplicated: Secondary | ICD-10-CM | POA: Diagnosis not present

## 2015-10-07 DIAGNOSIS — Z880 Allergy status to penicillin: Secondary | ICD-10-CM | POA: Diagnosis not present

## 2015-10-07 DIAGNOSIS — X58XXXA Exposure to other specified factors, initial encounter: Secondary | ICD-10-CM | POA: Insufficient documentation

## 2015-10-07 DIAGNOSIS — J45909 Unspecified asthma, uncomplicated: Secondary | ICD-10-CM | POA: Insufficient documentation

## 2015-10-07 DIAGNOSIS — I10 Essential (primary) hypertension: Secondary | ICD-10-CM | POA: Diagnosis not present

## 2015-10-07 DIAGNOSIS — S3992XA Unspecified injury of lower back, initial encounter: Secondary | ICD-10-CM | POA: Diagnosis present

## 2015-10-07 MED ORDER — OXYCODONE-ACETAMINOPHEN 5-325 MG PO TABS
1.0000 | ORAL_TABLET | Freq: Once | ORAL | Status: AC
  Administered 2015-10-07: 1 via ORAL
  Filled 2015-10-07: qty 1

## 2015-10-07 MED ORDER — OXYCODONE-ACETAMINOPHEN 5-325 MG PO TABS: 1.0000 | ORAL_TABLET | ORAL | Status: DC | PRN

## 2015-10-07 MED ORDER — CYCLOBENZAPRINE HCL 10 MG PO TABS
10.0000 mg | ORAL_TABLET | Freq: Once | ORAL | Status: AC
  Administered 2015-10-07: 10 mg via ORAL
  Filled 2015-10-07: qty 1

## 2015-10-07 MED ORDER — NAPROXEN 500 MG PO TABS: 500.0000 mg | ORAL_TABLET | Freq: Two times a day (BID) | ORAL | Status: DC

## 2015-10-07 MED ORDER — OXYCODONE-ACETAMINOPHEN 5-325 MG PO TABS
1.0000 | ORAL_TABLET | ORAL | Status: DC | PRN
Start: 2015-10-07 — End: 2015-11-09

## 2015-10-07 MED ORDER — ORPHENADRINE CITRATE ER 100 MG PO TB12: 100.0000 mg | ORAL_TABLET | Freq: Two times a day (BID) | ORAL | Status: DC

## 2015-10-07 MED ORDER — IBUPROFEN 800 MG PO TABS
800.0000 mg | ORAL_TABLET | Freq: Once | ORAL | Status: AC
  Administered 2015-10-07: 800 mg via ORAL
  Filled 2015-10-07: qty 1

## 2015-10-07 NOTE — ED Notes (Signed)
Pt states he was swinging an ax yesterday and now his back is hurting lower to mid back. Has hx of lumbar back fusion 5-6 years ago.

## 2015-10-07 NOTE — Discharge Instructions (Signed)
Back Injury Prevention °Back injuries can be very painful. They can also be difficult to heal. After having one back injury, you are more likely to injure your back again. It is important to learn how to avoid injuring or re-injuring your back. The following tips can help you to prevent a back injury. °WHAT SHOULD I KNOW ABOUT PHYSICAL FITNESS? °· Exercise for 30 minutes per day on most days of the week or as directed by your health care provider. Make sure to: °· Do aerobic exercises, such as walking, jogging, biking, or swimming. °· Do exercises that increase balance and strength, such as tai chi and yoga. These can decrease your risk of falling and injuring your back. °· Do stretching exercises to help with flexibility. °· Try to develop strong abdominal muscles. Your abdominal muscles provide a lot of the support that is needed by your back. °· Maintain a healthy weight.  This helps to decrease your risk of a back injury. °WHAT SHOULD I KNOW ABOUT MY DIET? °· Talk with your health care provider about your overall diet. Take supplements and vitamins only as directed by your health care provider. °· Talk with your health care provider about how much calcium and vitamin D you need each day. These nutrients help to prevent weakening of the bones (osteoporosis). Osteoporosis can cause broken (fractured) bones, which lead to back pain. °· Include good sources of calcium in your diet, such as dairy products, green leafy vegetables, and products that have had calcium added to them (fortified). °· Include good sources of vitamin D in your diet, such as milk and foods that are fortified with vitamin D. °WHAT SHOULD I KNOW ABOUT MY POSTURE? °· Sit up straight and stand up straight. Avoid leaning forward when you sit or hunching over when you stand. °· Choose chairs that have good low-back (lumbar) support. °· If you work at a desk, sit close to it so you do not need to lean over. Keep your chin tucked in. Keep your neck  drawn back, and keep your elbows bent at a right angle. Your arms should look like the letter "L." °· Sit high and close to the steering wheel when you drive. Add a lumbar support to your car seat, if needed. °· Avoid sitting or standing in one position for very long. Take breaks to get up, stretch, and walk around at least one time every hour. Take breaks every hour if you are driving for long periods of time. °· Sleep on your side with your knees slightly bent, or sleep on your back with a pillow under your knees. Do not lie on the front of your body to sleep. °WHAT SHOULD I KNOW ABOUT LIFTING, TWISTING, AND REACHING? °Lifting and Heavy Lifting °· Avoid heavy lifting, especially repetitive heavy lifting. If you must do heavy lifting: °· Stretch before lifting. °· Work slowly. °· Rest between lifts. °· Use a tool such as a cart or a dolly to move objects if one is available. °· Make several small trips instead of carrying one heavy load. °· Ask for help when you need it, especially when moving big objects. °· Follow these steps when lifting: °· Stand with your feet shoulder-width apart. °· Get as close to the object as you can. Do not try to pick up a heavy object that is far from your body. °· Use handles or lifting straps if they are available. °· Bend at your knees. Squat down, but keep your heels off the floor. °·   Keep your shoulders pulled back, your chin tucked in, and your back straight. °· Lift the object slowly while you tighten the muscles in your legs, abdomen, and buttocks. Keep the object as close to the center of your body as possible. °· Follow these steps when putting down a heavy load: °· Stand with your feet shoulder-width apart. °· Lower the object slowly while you tighten the muscles in your legs, abdomen, and buttocks. Keep the object as close to the center of your body as possible. °· Keep your shoulders pulled back, your chin tucked in, and your back straight. °· Bend at your knees. Squat  down, but keep your heels off the floor. °· Use handles or lifting straps if they are available. °Twisting and Reaching °· Avoid lifting heavy objects above your waist. °· Do not twist at your waist while you are lifting or carrying a load. If you need to turn, move your feet. °· Do not bend over without bending at your knees. °· Avoid reaching over your head, across a table, or for an object on a high surface. °WHAT ARE SOME OTHER TIPS? °· Avoid wet floors and icy ground. Keep sidewalks clear of ice to prevent falls. °· Do not sleep on a mattress that is too soft or too hard. °· Keep items that are used frequently within easy reach. °· Put heavier objects on shelves at waist level, and put lighter objects on lower or higher shelves. °· Find ways to decrease your stress, such as exercise, massage, or relaxation techniques. Stress can build up in your muscles. Tense muscles are more vulnerable to injury. °· Talk with your health care provider if you feel anxious or depressed. These conditions can make back pain worse. °· Wear flat heel shoes with cushioned soles. °· Avoid sudden movements. °· Use both shoulder straps when carrying a backpack. °· Do not use any tobacco products, including cigarettes, chewing tobacco, or electronic cigarettes. If you need help quitting, ask your health care provider. °  °This information is not intended to replace advice given to you by your health care provider. Make sure you discuss any questions you have with your health care provider. °  °Document Released: 11/10/2004 Document Revised: 02/17/2015 Document Reviewed: 10/07/2014 °Elsevier Interactive Patient Education ©2016 [employer] ° °Lumbosacral Strain °Lumbosacral strain is a strain of any of the parts that make up your lumbosacral vertebrae. Your lumbosacral vertebrae are the bones that make up the lower third of your backbone. Your lumbosacral vertebrae are held together by muscles and tough, fibrous tissue (ligaments).    °CAUSES  °A sudden blow to your back can cause lumbosacral strain. Also, anything that causes an excessive stretch of the muscles in the low back can cause this strain. This is typically seen when people exert themselves strenuously, fall, lift heavy objects, bend, or crouch repeatedly. °RISK FACTORS °· Physically demanding work. °· Participation in pushing or pulling sports or sports that require a sudden twist of the back (tennis, golf, baseball). °· Weight lifting. °· Excessive lower back curvature. °· Forward-tilted pelvis. °· Weak back or abdominal muscles or both. °· Tight hamstrings. °SIGNS AND SYMPTOMS  °Lumbosacral strain may cause pain in the area of your injury or pain that moves (radiates) down your leg.  °DIAGNOSIS °Your health care provider can often diagnose lumbosacral strain through a physical exam. In some cases, you may need tests such as X-ray exams.  °TREATMENT  °Treatment for your lower back injury depends on many factors that   your clinician will have to evaluate. However, most treatment will include the use of anti-inflammatory medicines. HOME CARE INSTRUCTIONS   Avoid hard physical activities (tennis, racquetball, waterskiing) if you are not in proper physical condition for it. This may aggravate or create problems.  If you have a back problem, avoid sports requiring sudden body movements. Swimming and walking are generally safer activities.  Maintain good posture.  Maintain a healthy weight.  For acute conditions, you may put ice on the injured area.  Put ice in a plastic bag.  Place a towel between your skin and the bag.  Leave the ice on for 20 minutes, 2-3 times a day.  When the low back starts healing, stretching and strengthening exercises may be recommended. SEEK MEDICAL CARE IF:  Your back pain is getting worse.  You experience severe back pain not relieved with medicines. SEEK IMMEDIATE MEDICAL CARE IF:   You have numbness, tingling, weakness, or problems  with the use of your arms or legs.  There is a change in bowel or bladder control.  You have increasing pain in any area of the body, including your belly (abdomen).  You notice shortness of breath, dizziness, or feel faint.  You feel sick to your stomach (nauseous), are throwing up (vomiting), or become sweaty.  You notice discoloration of your toes or legs, or your feet get very cold. MAKE SURE YOU:   Understand these instructions.  Will watch your condition.  Will get help right away if you are not doing well or get worse.   This information is not intended to replace advice given to you by your health care provider. Make sure you discuss any questions you have with your health care provider.   Document Released: 07/13/2005 Document Revised: 10/24/2014 Document Reviewed: 05/22/2013 Elsevier Interactive Patient Education 2016 [employer] Inc.  Naproxen and naproxen sodium oral immediate-release tablets What is this medicine? NAPROXEN (na PROX en) is a non-steroidal anti-inflammatory drug (NSAID). It is used to reduce swelling and to treat pain. This medicine may be used for dental pain, headache, or painful monthly periods. It is also used for painful joint and muscular problems such as arthritis, tendinitis, bursitis, and gout. This medicine may be used for other purposes; ask your health care provider or pharmacist if you have questions. What should I tell my health care provider before I take this medicine? They need to know if you have any of these conditions: -asthma -cigarette smoker -drink more than 3 alcohol containing drinks a day -heart disease or circulation problems such as heart failure or leg edema (fluid retention) -high blood pressure -kidney disease -liver disease -stomach bleeding or ulcers -an unusual or allergic reaction to naproxen, aspirin, other NSAIDs, other medicines, foods, dyes, or preservatives -pregnant or trying to get  pregnant -breast-feeding How should I use this medicine? Take this medicine by mouth with a glass of water. Follow the directions on the prescription label. Take it with food if your stomach gets upset. Try to not lie down for at least 10 minutes after you take it. Take your medicine at regular intervals. Do not take your medicine more often than directed. Long-term, continuous use may increase the risk of heart attack or stroke. A special MedGuide will be given to you by the pharmacist with each prescription and refill. Be sure to read this information carefully each time. Talk to your pediatrician regarding the use of this medicine in children. Special care may be needed. Overdosage: If you think you  have taken too much of this medicine contact a poison control center or emergency room at once. NOTE: This medicine is only for you. Do not share this medicine with others. What if I miss a dose? If you miss a dose, take it as soon as you can. If it is almost time for your next dose, take only that dose. Do not take double or extra doses. What may interact with this medicine? -alcohol -aspirin -cidofovir -diuretics -lithium -methotrexate -other drugs for inflammation like ketorolac or prednisone -pemetrexed -probenecid -warfarin This list may not describe all possible interactions. Give your health care provider a list of all the medicines, herbs, non-prescription drugs, or dietary supplements you use. Also tell them if you smoke, drink alcohol, or use illegal drugs. Some items may interact with your medicine. What should I watch for while using this medicine? Tell your doctor or health care professional if your pain does not get better. Talk to your doctor before taking another medicine for pain. Do not treat yourself. This medicine does not prevent heart attack or stroke. In fact, this medicine may increase the chance of a heart attack or stroke. The chance may increase with longer use of  this medicine and in people who have heart disease. If you take aspirin to prevent heart attack or stroke, talk with your doctor or health care professional. Do not take other medicines that contain aspirin, ibuprofen, or naproxen with this medicine. Side effects such as stomach upset, nausea, or ulcers may be more likely to occur. Many medicines available without a prescription should not be taken with this medicine. This medicine can cause ulcers and bleeding in the stomach and intestines at any time during treatment. Do not smoke cigarettes or drink alcohol. These increase irritation to your stomach and can make it more susceptible to damage from this medicine. Ulcers and bleeding can happen without warning symptoms and can cause death. You may get drowsy or dizzy. Do not drive, use machinery, or do anything that needs mental alertness until you know how this medicine affects you. Do not stand or sit up quickly, especially if you are an older patient. This reduces the risk of dizzy or fainting spells. This medicine can cause you to bleed more easily. Try to avoid damage to your teeth and gums when you brush or floss your teeth. What side effects may I notice from receiving this medicine? Side effects that you should report to your doctor or health care professional as soon as possible: -black or bloody stools, blood in the urine or vomit -blurred vision -chest pain -difficulty breathing or wheezing -nausea or vomiting -severe stomach pain -skin rash, skin redness, blistering or peeling skin, hives, or itching -slurred speech or weakness on one side of the body -swelling of eyelids, throat, lips -unexplained weight gain or swelling -unusually weak or tired -yellowing of eyes or skin Side effects that usually do not require medical attention (report to your doctor or health care professional if they continue or are bothersome): -constipation -headache -heartburn This list may not describe all  possible side effects. Call your doctor for medical advice about side effects. You may report side effects to FDA at 1-800-FDA-1088. Where should I keep my medicine? Keep out of the reach of children. Store at room temperature between 15 and 30 degrees C (59 and 86 degrees F). Keep container tightly closed. Throw away any unused medicine after the expiration date. NOTE: This sheet is a summary. It may not cover all  possible information. If you have questions about this medicine, talk to your doctor, pharmacist, or health care provider.    2016, Elsevier/Gold Standard. (2009-10-05 20:10:16)  Orphenadrine tablets What is this medicine? ORPHENADRINE (or FEN a dreen) helps to relieve pain and stiffness in muscles and can treat muscle spasms. This medicine may be used for other purposes; ask your health care provider or pharmacist if you have questions. What should I tell my health care provider before I take this medicine? They need to know if you have any of these conditions: -glaucoma -heart disease -kidney disease -myasthenia gravis -peptic ulcer disease -prostate disease -stomach problems -an unusual or allergic reaction to orphenadrine, other medicines, foods, lactose, dyes, or preservatives -pregnant or trying to get pregnant -breast-feeding How should I use this medicine? Take this medicine by mouth with a full glass of water. Follow the directions on the prescription label. Take your medicine at regular intervals. Do not take your medicine more often than directed. Do not take more than you are told to take. Talk to your pediatrician regarding the use of this medicine in children. Special care may be needed. Patients over 51 years old may have a stronger reaction and need a smaller dose. Overdosage: If you think you have taken too much of this medicine contact a poison control center or emergency room at once. NOTE: This medicine is only for you. Do not share this medicine with  others. What if I miss a dose? If you miss a dose, take it as soon as you can. If it is almost time for your next dose, take only that dose. Do not take double or extra doses. What may interact with this medicine? -alcohol -antihistamines -barbiturates, like phenobarbital -benzodiazepines -cyclobenzaprine -medicines for pain -phenothiazines like chlorpromazine, mesoridazine, prochlorperazine, thioridazine This list may not describe all possible interactions. Give your health care provider a list of all the medicines, herbs, non-prescription drugs, or dietary supplements you use. Also tell them if you smoke, drink alcohol, or use illegal drugs. Some items may interact with your medicine. What should I watch for while using this medicine? Your mouth may get dry. Chewing sugarless gum or sucking hard candy, and drinking plenty of water may help. Contact your doctor if the problem does not go away or is severe. This medicine may cause dry eyes and blurred vision. If you wear contact lenses you may feel some discomfort. Lubricating drops may help. See your eye doctor if the problem does not go away or is severe. You may get drowsy or dizzy. Do not drive, use machinery, or do anything that needs mental alertness until you know how this medicine affects you. Do not stand or sit up quickly, especially if you are an older patient. This reduces the risk of dizzy or fainting spells. Alcohol may interfere with the effect of this medicine. Avoid alcoholic drinks. What side effects may I notice from receiving this medicine? Side effects that you should report to your doctor or health care professional as soon as possible: -allergic reactions like skin rash, itching or hives, swelling of the face, lips, or tongue -changes in vision -difficulty breathing -fast heartbeat or palpitations -hallucinations -light headedness, fainting spells -vomiting Side effects that usually do not require medical attention  (report to your doctor or health care professional if they continue or are bothersome): -dizziness -drowsiness -headache -nausea This list may not describe all possible side effects. Call your doctor for medical advice about side effects. You may report side effects  to FDA at 1-800-FDA-1088. Where should I keep my medicine? Keep out of the reach of children. This medicine may cause accidental overdose and death if it taken by other adults, children, or pets. Mix any unused medicine with a substance like cat litter or coffee grounds. Then throw the medicine away in a sealed container like a sealed bag or a coffee can with a lid. Do not use the medicine after the expiration date. Store at room temperature between 15 and 30 degrees C (59 and 86 degrees F). NOTE: This sheet is a summary. It may not cover all possible information. If you have questions about this medicine, talk to your doctor, pharmacist, or health care provider.    2016, Elsevier/Gold Standard. (2013-11-29 15:35:08)  Acetaminophen; Oxycodone tablets What is this medicine? ACETAMINOPHEN; OXYCODONE (a set a MEE noe fen; ox i KOE done) is a pain reliever. It is used to treat moderate to severe pain. This medicine may be used for other purposes; ask your health care provider or pharmacist if you have questions. What should I tell my health care provider before I take this medicine? They need to know if you have any of these conditions: -brain tumor -Crohn's disease, inflammatory bowel disease, or ulcerative colitis -drug abuse or addiction -head injury -heart or circulation problems -if you often drink alcohol -kidney disease or problems going to the bathroom -liver disease -lung disease, asthma, or breathing problems -an unusual or allergic reaction to acetaminophen, oxycodone, other opioid analgesics, other medicines, foods, dyes, or preservatives -pregnant or trying to get pregnant -breast-feeding How should I use this  medicine? Take this medicine by mouth with a full glass of water. Follow the directions on the prescription label. You can take it with or without food. If it upsets your stomach, take it with food. Take your medicine at regular intervals. Do not take it more often than directed. Talk to your pediatrician regarding the use of this medicine in children. Special care may be needed. Patients over 58 years old may have a stronger reaction and need a smaller dose. Overdosage: If you think you have taken too much of this medicine contact a poison control center or emergency room at once. NOTE: This medicine is only for you. Do not share this medicine with others. What if I miss a dose? If you miss a dose, take it as soon as you can. If it is almost time for your next dose, take only that dose. Do not take double or extra doses. What may interact with this medicine? -alcohol -antihistamines -barbiturates like amobarbital, butalbital, butabarbital, methohexital, pentobarbital, phenobarbital, thiopental, and secobarbital -benztropine -drugs for bladder problems like solifenacin, trospium, oxybutynin, tolterodine, hyoscyamine, and methscopolamine -drugs for breathing problems like ipratropium and tiotropium -drugs for certain stomach or intestine problems like propantheline, homatropine methylbromide, glycopyrrolate, atropine, belladonna, and dicyclomine -general anesthetics like etomidate, ketamine, nitrous oxide, propofol, desflurane, enflurane, halothane, isoflurane, and sevoflurane -medicines for depression, anxiety, or psychotic disturbances -medicines for sleep -muscle relaxants -naltrexone -narcotic medicines (opiates) for pain -phenothiazines like perphenazine, thioridazine, chlorpromazine, mesoridazine, fluphenazine, prochlorperazine, promazine, and trifluoperazine -scopolamine -tramadol -trihexyphenidyl This list may not describe all possible interactions. Give your health care provider a  list of all the medicines, herbs, non-prescription drugs, or dietary supplements you use. Also tell them if you smoke, drink alcohol, or use illegal drugs. Some items may interact with your medicine. What should I watch for while using this medicine? Tell your doctor or health care professional if your pain does  not go away, if it gets worse, or if you have new or a different type of pain. You may develop tolerance to the medicine. Tolerance means that you will need a higher dose of the medication for pain relief. Tolerance is normal and is expected if you take this medicine for a long time. Do not suddenly stop taking your medicine because you may develop a severe reaction. Your body becomes used to the medicine. This does NOT mean you are addicted. Addiction is a behavior related to getting and using a drug for a non-medical reason. If you have pain, you have a medical reason to take pain medicine. Your doctor will tell you how much medicine to take. If your doctor wants you to stop the medicine, the dose will be slowly lowered over time to avoid any side effects. You may get drowsy or dizzy. Do not drive, use machinery, or do anything that needs mental alertness until you know how this medicine affects you. Do not stand or sit up quickly, especially if you are an older patient. This reduces the risk of dizzy or fainting spells. Alcohol may interfere with the effect of this medicine. Avoid alcoholic drinks. There are different types of narcotic medicines (opiates) for pain. If you take more than one type at the same time, you may have more side effects. Give your health care provider a list of all medicines you use. Your doctor will tell you how much medicine to take. Do not take more medicine than directed. Call emergency for help if you have problems breathing. The medicine will cause constipation. Try to have a bowel movement at least every 2 to 3 days. If you do not have a bowel movement for 3 days, call  your doctor or health care professional. Do not take Tylenol (acetaminophen) or medicines that have acetaminophen with this medicine. Too much acetaminophen can be very dangerous. Many nonprescription medicines contain acetaminophen. Always read the labels carefully to avoid taking more acetaminophen. What side effects may I notice from receiving this medicine? Side effects that you should report to your doctor or health care professional as soon as possible: -allergic reactions like skin rash, itching or hives, swelling of the face, lips, or tongue -breathing difficulties, wheezing -confusion -light headedness or fainting spells -severe stomach pain -unusually weak or tired -yellowing of the skin or the whites of the eyes Side effects that usually do not require medical attention (report to your doctor or health care professional if they continue or are bothersome): -dizziness -drowsiness -nausea -vomiting This list may not describe all possible side effects. Call your doctor for medical advice about side effects. You may report side effects to FDA at 1-800-FDA-1088. Where should I keep my medicine? Keep out of the reach of children. This medicine can be abused. Keep your medicine in a safe place to protect it from theft. Do not share this medicine with anyone. Selling or giving away this medicine is dangerous and against the law. This medicine may cause accidental overdose and death if it taken by other adults, children, or pets. Mix any unused medicine with a substance like cat litter or coffee grounds. Then throw the medicine away in a sealed container like a sealed bag or a coffee can with a lid. Do not use the medicine after the expiration date. Store at room temperature between 20 and 25 degrees C (68 and 77 degrees F). NOTE: This sheet is a summary. It may not cover all possible information. If  you have questions about this medicine, talk to your doctor, pharmacist, or health care  provider.    2016, Elsevier/Gold Standard. (2014-09-03 15:18:46)

## 2015-10-07 NOTE — ED Provider Notes (Signed)
CSN: [id number]     Arrival date & time 10/07/15  2231 History  By signing my name below, I, Nathan Waters, attest that this documentation has been prepared under the direction and in the presence of Delora Fuel, MD. Electronically Signed: Jolayne Waters, Scribe. 10/07/2015. 11:17 PM.      Chief Complaint  Patient presents with  . Back Pain    The history is provided by the patient. No language interpreter was used.    HPI Comments: Nathan Waters is a 34 y.o. male with a hx of injury to his L4 and L5 disc and repair by effusion, who presents to the Emergency Department complaining of moderate 8.5/10 lower and middle back pain onset two days ago after experiencing a sharp pain while swinging an axe. Pt also notes radiation of the pain into his hips and lower extremities and states that he sometimes experiences numbness from his waist down when experiencing the pain. He also reports that he almost fell a couple of times today and notes that his pain is worse with movement. He has tried lying on a heating blanket with little relief. Pt denies issues with his bowel and bladder. Pt has medicaid for insurance.   Past Medical History  Diagnosis Date  . Migraine   . STEMI (ST elevation myocardial infarction) ([place])     Complicated by VF arrest 11/2011  . Coronary atherosclerosis of native coronary artery     BMS to mid circumflex 11/2011, LVEF 55-60%  . Essential hypertension, benign   . Mixed hyperlipidemia   . Asthma    Past Surgical History  Procedure Laterality Date  . Splenectomy    . Back surgery    . Left heart catheterization with coronary angiogram N/A 11/21/2011    Procedure: LEFT HEART CATHETERIZATION WITH CORONARY ANGIOGRAM;  Surgeon: Peter M Martinique, MD;  Location: [facility] CATH LAB;  Service: Cardiovascular;  Laterality: N/A;  . Percutaneous coronary stent intervention (pci-s) N/A 11/21/2011    Procedure: PERCUTANEOUS CORONARY STENT INTERVENTION (PCI-S);  Surgeon: Peter M  Martinique, MD;  Location: [facility] CATH LAB;  Service: Cardiovascular;  Laterality: N/A;   Family History  Problem Relation Age of Onset  . Malignant hyperthermia Mother   . Malignant hyperthermia Brother   . Heart disease Brother   . Heart disease Father    Social History  Substance Use Topics  . Smoking status: Current Every Day Smoker -- 1.00 packs/day    Types: Cigarettes  . Smokeless tobacco: None  . Alcohol Use: 0.0 oz/week     Comment: Occasionally    Review of Systems  Musculoskeletal: Positive for back pain and arthralgias.  Neurological: Positive for numbness.  All other systems reviewed and are negative.  Allergies  Penicillins  Home Medications   Prior to Admission medications   Medication Sig Start Date End Date Taking? Authorizing Provider  aspirin 325 MG EC tablet Take 325 mg by mouth daily.    Historical Provider, MD  HYDROcodone-acetaminophen (NORCO/VICODIN) 5-325 MG tablet Take 1-2 tablets by mouth every 6 (six) hours as needed. 08/25/15   Kristen N Ward, DO  metoprolol tartrate (LOPRESSOR) 25 MG tablet Take 25 mg by mouth 2 (two) times daily.    Historical Provider, MD  nitroGLYCERIN (NITROSTAT) 0.4 MG SL tablet Place 0.4 mg under the tongue every 5 (five) minutes as needed for chest pain.    Historical Provider, MD  pravastatin (PRAVACHOL) 40 MG tablet Take 40 mg by mouth at bedtime.  Historical Provider, MD  predniSONE (DELTASONE) 20 MG tablet 3 tabs po daily x 3 days, then 2 tabs x 3 days, then 1.5 tabs x 3 days, then 1 tab x 3 days, then 0.5 tabs x 3 days 07/31/15   Merrily Pew, MD  traMADol (ULTRAM) 50 MG tablet Take 1 tablet (50 mg total) by mouth every 6 (six) hours as needed. 04/01/15   [place] Ferguson, MD   BP 133/85 mmHg  Pulse 83  Temp(Src) 97.7 F (36.5 C) (Oral)  Resp 14  Ht 6' (1.829 m)  Wt 240 lb (108.863 kg)  BMI 32.54 kg/m2  SpO2 97% Physical Exam  Constitutional: He is oriented to person, place, and time. He appears well-developed and  well-nourished. No distress.  HENT:  Head: Normocephalic and atraumatic.  Eyes: Conjunctivae and EOM are normal. Pupils are equal, round, and reactive to light.  Neck: Normal range of motion. Neck supple. No JVD present.  Cardiovascular: Normal rate, regular rhythm and normal heart sounds.   No murmur heard. Pulmonary/Chest: Effort normal and breath sounds normal. He has no wheezes. He has no rales. He exhibits no tenderness.  Abdominal: Soft. Bowel sounds are normal. He exhibits no distension and no mass. There is no tenderness.  Musculoskeletal: Normal range of motion. He exhibits no edema.  Mild tenderness upper lumbar spine Moderate bilateral paralumbar spasm Straight leg raise positive bilaterally at 30 degrees   Lymphadenopathy:    He has no cervical adenopathy.  Neurological: He is alert and oriented to person, place, and time. No cranial nerve deficit. He exhibits normal muscle tone. Coordination normal.  Normal strength and sensation in both legs.  Skin: Skin is warm and dry. No rash noted.  Psychiatric: He has a normal mood and affect. His behavior is normal. Judgment and thought content normal.  Nursing note and vitals reviewed.   ED Course  Procedures  DIAGNOSTIC STUDIES:    Oxygen Saturation is 97% on RA, normal by my interpretation.   COORDINATION OF CARE:  11:05 PM Will prescribe pt naproxin, ibuprofen, and percocet. Will administer pt first dose of pain medication in the ED. Discussed treatment plan with pt at bedside and pt agreed to plan.    MDM   Final diagnoses:  Lumbar strain, initial encounter    Acute lumbosacral strain. This appears to be purely musculoskeletal. No evidence of neurologic compromise. Old records are reviewed and he does have several ED visits for upper back and lower back pain. He does have history of prior lumbar fusion but I do not see any indications for imaging today. I reviewed his record on the [place] controlled substance  reporting website and he has no narcotic prescriptions for the last 6 months. He is discharged with prescriptions for naproxen, orphenadrine, and oxycodone-acetaminophen. He has expressed interest in following up with his neurosurgeon. I've advised him to wait for 2 weeks to see if he has any improvement with this current regimen.  I personally performed the services described in this documentation, which was scribed in my presence. The recorded information has been reviewed and is accurate.       Delora Fuel, MD [id number]

## 2015-10-07 NOTE — ED Notes (Signed)
MD at bedside. 

## 2015-11-09 ENCOUNTER — Encounter (HOSPITAL_COMMUNITY): Payer: Self-pay | Admitting: Emergency Medicine

## 2015-11-09 ENCOUNTER — Emergency Department (HOSPITAL_COMMUNITY)
Admission: EM | Admit: 2015-11-09 | Discharge: 2015-11-10 | Disposition: A | Discharge status: Home / Self Care | Payer: Medicaid Other | Attending: Emergency Medicine | Admitting: Emergency Medicine

## 2015-11-09 DIAGNOSIS — Z7982 Long term (current) use of aspirin: Secondary | ICD-10-CM | POA: Insufficient documentation

## 2015-11-09 DIAGNOSIS — J45909 Unspecified asthma, uncomplicated: Secondary | ICD-10-CM | POA: Diagnosis not present

## 2015-11-09 DIAGNOSIS — Z9889 Other specified postprocedural states: Secondary | ICD-10-CM | POA: Diagnosis not present

## 2015-11-09 DIAGNOSIS — Z79899 Other long term (current) drug therapy: Secondary | ICD-10-CM | POA: Insufficient documentation

## 2015-11-09 DIAGNOSIS — E782 Mixed hyperlipidemia: Secondary | ICD-10-CM | POA: Insufficient documentation

## 2015-11-09 DIAGNOSIS — I252 Old myocardial infarction: Secondary | ICD-10-CM | POA: Insufficient documentation

## 2015-11-09 DIAGNOSIS — F17210 Nicotine dependence, cigarettes, uncomplicated: Secondary | ICD-10-CM | POA: Diagnosis not present

## 2015-11-09 DIAGNOSIS — M436 Torticollis: Secondary | ICD-10-CM | POA: Diagnosis not present

## 2015-11-09 DIAGNOSIS — I10 Essential (primary) hypertension: Secondary | ICD-10-CM | POA: Insufficient documentation

## 2015-11-09 DIAGNOSIS — I2510 Atherosclerotic heart disease of native coronary artery without angina pectoris: Secondary | ICD-10-CM | POA: Diagnosis not present

## 2015-11-09 DIAGNOSIS — Z880 Allergy status to penicillin: Secondary | ICD-10-CM | POA: Insufficient documentation

## 2015-11-09 DIAGNOSIS — M542 Cervicalgia: Secondary | ICD-10-CM | POA: Diagnosis present

## 2015-11-09 MED ORDER — METHOCARBAMOL 500 MG PO TABS: 500.0000 mg | ORAL_TABLET | Freq: Two times a day (BID) | ORAL | Status: DC

## 2015-11-09 MED ORDER — METHOCARBAMOL 500 MG PO TABS
500.0000 mg | ORAL_TABLET | Freq: Once | ORAL | Status: AC
  Administered 2015-11-09: 500 mg via ORAL
  Filled 2015-11-09: qty 1

## 2015-11-09 MED ORDER — OXYCODONE-ACETAMINOPHEN 5-325 MG PO TABS: 2.0000 | ORAL_TABLET | ORAL | Status: DC | PRN

## 2015-11-09 MED ORDER — OXYCODONE-ACETAMINOPHEN 5-325 MG PO TABS
2.0000 | ORAL_TABLET | Freq: Once | ORAL | Status: AC
  Administered 2015-11-09: 2 via ORAL
  Filled 2015-11-09: qty 2

## 2015-11-09 NOTE — ED Notes (Signed)
Onset 3 days ago woke up with neck pain, today can not turn head, denies injury

## 2015-11-09 NOTE — Discharge Instructions (Signed)
Acute Torticollis Take ibuprofen or Tylenol for pain and Percocet for breakthrough pain. Take Robaxin as a muscle relaxer. Do not drive or work when using muscle relaxers or narcotic pain medication. Follow-up with a primary care provider using the resource guide below. Torticollis is a condition in which the muscles of the neck tighten (contract) abnormally, causing the neck to twist and the head to move into an unnatural position. Torticollis that develops suddenly is called acute torticollis. If torticollis becomes chronic and is left untreated, the face and neck can become deformed. CAUSES This condition may be caused by:  Sleeping in an awkward position (common).  Extending or twisting the neck muscles beyond their normal position.  Infection. In some cases, the cause may not be known. SYMPTOMS Symptoms of this condition include:  An unnatural position of the head.  Neck pain.  A limited ability to move the neck.  Twisting of the neck to one side. DIAGNOSIS This condition is diagnosed with a physical exam. You may also have imaging tests, such as an X-ray, CT scan, or MRI. TREATMENT Treatment for this condition involves trying to relax the neck muscles. It may include:  Medicines or shots.  Physical therapy.  Surgery. This may be done in severe cases. HOME CARE INSTRUCTIONS  Take medicines only as directed by your health care provider.  Do stretching exercises and massage your neck as directed by your health care provider.  Keep all follow-up visits as directed by your health care provider. This is important. SEEK MEDICAL CARE IF:  You develop a fever. SEEK IMMEDIATE MEDICAL CARE IF:  You develop difficulty breathing.  You develop noisy breathing (stridor).  You start drooling.  You have trouble swallowing or have pain with swallowing.  You develop numbness or weakness in your hands or feet.  You have changes in your speech, understanding, or vision.  Your  pain gets worse.   This information is not intended to replace advice given to you by your health care provider. Make sure you discuss any questions you have with your health care provider.   Document Released: 09/30/2000 Document Revised: 02/17/2015 Document Reviewed: 09/29/2014 Elsevier Interactive Patient Education 2016 [employer].  Emergency Department Resource Guide 1) Find a Doctor and Pay Out of Pocket Although you won't have to find out who is covered by your insurance plan, it is a good idea to ask around and get recommendations. You will then need to call the office and see if the doctor you have chosen will accept you as a new patient and what types of options they offer for patients who are self-pay. Some doctors offer discounts or will set up payment plans for their patients who do not have insurance, but you will need to ask so you aren't surprised when you get to your appointment.  2) Contact Your Local Health Department Not all health departments have doctors that can see patients for sick visits, but many do, so it is worth a call to see if yours does. If you don't know where your local health department is, you can check in your phone book. The CDC also has a tool to help you locate your state's health department, and many state websites also have listings of all of their local health departments.  3) Find a [facility] If your illness is not likely to be very severe or complicated, you may want to try a walk in clinic. These are popping up all over the country in pharmacies, drugstores,  and shopping centers. They're usually staffed by nurse practitioners or physician assistants that have been trained to treat common illnesses and complaints. They're usually fairly quick and inexpensive. However, if you have serious medical issues or chronic medical problems, these are probably not your best option.  No Primary Care Doctor: - Call Health Connect at  [phone] - they can help  you locate a primary care doctor that  accepts your insurance, provides certain services, etc. - Physician Referral Service- [phone]  Chronic Pain Problems: Organization         Address  Phone   Notes  [facility]  [phone] Patients need to be referred by their primary care doctor.   Medication Assistance: Organization         Address  Phone   Notes  [facility] (2-Rh) Medication [facility] [address] [place], [place] [address] [phone] --Must be a resident of [facility] -- Must have NO insurance coverage whatsoever (no Medicaid/ Medicare, etc.) -- The pt. MUST have a primary care doctor that directs their care regularly and follows them in the community   MedAssist  [phone]   [employer]  [phone]    Agencies that provide inexpensive medical care: Organization         Address  Phone   Notes  [place]  [phone]   [facility]    [phone]   [facility] [place], [place] [address] [phone]   [place] [address], [place] [phone]   Planned Parenthood    [phone]   [facility]    [phone]   [place] and [address], [place] Phone:  [phone], Fax:  [phone] Hours of Operation:  9 am - 6 pm, M-F.  Also accepts Medicaid/Medicare and self-pay.  [facility] for [place] [address], [address], [place] Phone: [phone], Fax: [phone]. Hours of Operation:  8:30 am - 5:30 pm, M-F.  Also accepts Medicaid and self-pay.  [facility] [place] [address], [place] Phone: [phone]   [place], [place], [place] [phone], Ext. [phone] Mondays & Thursdays: 7-9 AM.  First 15 patients are seen on a first come, first serve basis.    [place] Providers:  Organization         Address  Phone   Notes  [facility] [address], [place] [phone] Also accepts self-pay patients.  [facility] [id number] [place], [place]  [phone]   [place], [address], [place] [phone]   [facility] [address], [place] [phone]   Lucianne Lei [address], [place]   [phone] Only accepts [place] Access [place] patients after they have their name applied to their card.   Self-Pay (no insurance) in [facility]:  Organization         Address  Phone   Notes  Sickle Cell Patients, [facility] [place] [phone]   [facility] [place] [phone]   [facility]gent [place]  [place], [place], [place] [phone])  [id number]   [facility]/Dr. Vista Lawman  [address] [place] or [address], [place] [phone] Phone number for both [place] and [place] locations is the same.  Urgent Medical and [facility] [address], [place] [phone]   [facility] [address], [place] or [address] [phone]   [facility] [place] [address] [place] [phone], phone; [phone], fax Sees patients 1st and 3rd Saturday of every month.  Must not qualify for public or private insurance (i.e. Medicaid, Medicare, [place] Health Choice, Veterans' Benefits)  Household income should be no more than 200% of the poverty level The clinic cannot treat you if you are pregnant or think you are pregnant  Sexually transmitted diseases are not treated at the clinic.    Dental Care: Organization         Address  Phone  Notes  [facility] Department of [facility] [place] [phone] Accepts children up to age 27 who are enrolled in [place] or [place]; pregnant women with a Medicaid card; and children who have applied for Medicaid or [place] Health Choice, but were declined, whose parents can pay a reduced fee at time of service.  [facility] Department of [facility]  [address], [place] [phone] Accepts children up to age 49 who are enrolled in [place] or [place]; pregnant women with a Medicaid card; and children who have applied for Medicaid or [place] Health Choice, but were declined, whose parents can pay a reduced fee at time of service.  [facility] Adult [facility] Access PROGRAM  [place] [phone] Patients are seen by appointment only. Walk-ins are not accepted. [place] will see patients 46 years of age and older. Monday - Tuesday (8am-5pm) Most Wednesdays (8:30-5pm) $30 per visit, cash only  [facility] Adult Dental Access PROGRAM  [address], [facility] [phone] Patients are seen by appointment only. Walk-ins are not accepted. [employer] will see patients 37 years of age and older. One Wednesday Evening (Monthly: Volunteer Based).  $30 per visit, cash only  [place]  [phone] for adults; Children under age 94, call Graduate Pediatric Dentistry at [phone]. Children aged 14-14, please call [phone] to request a pediatric application.  Dental services are provided in all areas of dental care including fillings, crowns and bridges, complete and partial dentures, implants, gum treatment, root canals, and extractions. Preventive care is also provided. Treatment is provided to both adults and children. Patients are selected via a lottery and there is often a waiting list.   [facility] [address], [place]  [phone] [email or web address]   [facility] [address] [place], [place] [phone], Ext. [phone] Second and Fourth Thursday of each month, opens at 6:30 AM; Clinic ends at 9 AM.  Patients are seen on a first-come first-served basis, and a limited number are seen during each clinic.   [facility]  [address] Hillard Danker [place], [place] [phone]   Eligibility Requirements You must have lived in [place], [place], or [place] counties for at least the last three months.   You cannot be eligible for state or federal sponsored [employer], including [employer], [place], or [employer].   You generally cannot be eligible  for healthcare insurance through your employer.    How to apply: Eligibility screenings are held every Tuesday and Wednesday afternoon from 1:00 pm until 4:00 pm. You do not need an appointment for the interview!  [facility] [address] [place], [place]   [place]  [place] Department  [place]  [phone]    Behavioral Health Resources in the Community: Intensive Outpatient Programs Organization         Address  Phone  Notes  [place]. [address], [place], [place] [phone]   [facility] Outpatient [address] [place], [place]   ADS: Alcohol & Drug Svcs [address] [place], [place]   [place] [address]  [place], [place] or [phone]   Substance Abuse Resources Organization         Address  Phone  Notes  Alcohol and Drug Services  [phone]   [place]  [phone]   [facility] [place]   Chinita Pester  [phone]   Residential & Outpatient Substance Abuse Program  [phone]   Psychological Services Organization         Address  Phone  Notes  [facility] [place]  [place]  [phone]   [place] [address] [place] or [phone]    Mobile Crisis Teams Organization         Address  Phone  Notes  [facility], Mobile Crisis Care Unit  [phone]   [facility]  [address] [place], [place]   [place] Levels [address], [place] [phone]    Self-Help/Support Groups Organization         Address  Phone             Notes  [place]. of [place] - variety of support groups  [place] Call for more information  Narcotics Anonymous (NA), [facility] [address], [employer] [place]  2 meetings at this location   Special educational needs teacher         Address  Phone  Notes  [facility]AP Residential [facility],    [place]  [phone]   [facility]  [address], [place] [id number], [place], [place]   [place], [place] [phone] Admissions: 8am-3pm M-F  [facility] [address],    [place], [place] [phone]   [facility] [address] [place], [place], [place]   [facility] [address],  [place], [place]   [facility] - Intensive Outpatient [address], [place], [place]   [facility] ([place].,  [place], [place] [phone] or [phone]   [facility] Services (RTS) [address], [place], [place] Accepts Medicaid  [facility] [place] [address],  [place] [phone] Substance Abuse/Addiction Treatment   [facility] Organization         Address  Phone  Notes  [facility]  [phone]   Domenic Schwab, PhD [address] [place], [place]   [phone] or  [phone]   [place],  [place] [phone]   [facility] [address] [place], [place] [phone] Insurance/Medicaid/sponsorship through [employer] and Families [address], Ste [place], [place] [phone] [place] [address]   [place], [place] [phone]    Dr. Adele Schilder  [phone]   [facility] Dept. 1) [address] [place] 2) [place] 3)  [place] [address], [place] [phone]  [phone]   [place] [phone] or [phone] (After Hours)

## 2015-11-09 NOTE — ED Provider Notes (Signed)
CSN: [id number]     Arrival date & time 11/09/15  2248 History   First MD Initiated Contact with Patient 11/09/15 2329     Chief Complaint  Patient presents with  . Neck Pain   (Consider location/radiation/quality/duration/timing/severity/associated sxs/prior Treatment) Patient is a 35 y.o. male presenting with neck pain. The history is provided by the patient and the spouse. No language interpreter was used.  Neck Pain Associated symptoms: no fever    Nathan Waters is a 35 year old male with a past medical history of V. fib arrest, migraines, asthma, and splenectomy who presents for sudden onset right-sided neck pain that he woke up with 3 days ago. Worse with movement of his neck and right arm. Denies any treatment prior to arrival. No previous history of this. Denies any fever, chills, night sweats, rash.   Past Medical History  Diagnosis Date  . Migraine   . STEMI (ST elevation myocardial infarction) ([place])     Complicated by VF arrest 11/2011  . Coronary atherosclerosis of native coronary artery     BMS to mid circumflex 11/2011, LVEF 55-60%  . Essential hypertension, benign   . Mixed hyperlipidemia   . Asthma    Past Surgical History  Procedure Laterality Date  . Splenectomy    . Back surgery    . Left heart catheterization with coronary angiogram N/A 11/21/2011    Procedure: LEFT HEART CATHETERIZATION WITH CORONARY ANGIOGRAM;  Surgeon: Peter M Martinique, MD;  Location: [facility] CATH LAB;  Service: Cardiovascular;  Laterality: N/A;  . Percutaneous coronary stent intervention (pci-s) N/A 11/21/2011    Procedure: PERCUTANEOUS CORONARY STENT INTERVENTION (PCI-S);  Surgeon: Peter M Martinique, MD;  Location: [facility] CATH LAB;  Service: Cardiovascular;  Laterality: N/A;   Family History  Problem Relation Age of Onset  . Malignant hyperthermia Mother   . Malignant hyperthermia Brother   . Heart disease Brother   . Heart disease Father    Social History  Substance Use Topics  . Smoking status:  Current Every Day Smoker -- 1.00 packs/day    Types: Cigarettes  . Smokeless tobacco: None  . Alcohol Use: 0.0 oz/week     Comment: Occasionally    Review of Systems  Constitutional: Negative for fever and chills.  Musculoskeletal: Positive for neck pain.  Skin: Negative for rash.  All other systems reviewed and are negative.     Allergies  Penicillins  Home Medications   Prior to Admission medications   Medication Sig Start Date End Date Taking? Authorizing Provider  aspirin 325 MG EC tablet Take 325 mg by mouth daily.    Historical Provider, MD  methocarbamol (ROBAXIN) 500 MG tablet Take 1 tablet (500 mg total) by mouth 2 (two) times daily. 11/09/15   Brindley Madarang Patel-Mills, PA-C  metoprolol tartrate (LOPRESSOR) 25 MG tablet Take 25 mg by mouth 2 (two) times daily.    Historical Provider, MD  naproxen (NAPROSYN) 500 MG tablet Take 1 tablet (500 mg total) by mouth 2 (two) times daily. [id number]   Delora Fuel, MD  nitroGLYCERIN (NITROSTAT) 0.4 MG SL tablet Place 0.4 mg under the tongue every 5 (five) minutes as needed for chest pain.    Historical Provider, MD  orphenadrine (NORFLEX) 100 MG tablet Take 1 tablet (100 mg total) by mouth 2 (two) times daily. [id number]   Delora Fuel, MD  oxyCODONE-acetaminophen (PERCOCET/ROXICET) 5-325 MG tablet Take 2 tablets by mouth every 4 (four) hours as needed for severe pain. 11/09/15   Joselinne Lawal Patel-Mills, PA-C  pravastatin (PRAVACHOL)  40 MG tablet Take 40 mg by mouth at bedtime.    Historical Provider, MD   BP 131/62 mmHg  Pulse 70  Temp(Src) 97.7 F (36.5 C) (Oral)  Resp 16  Ht 6' (1.829 m)  Wt 108.863 kg  BMI 32.54 kg/m2  SpO2 95% Physical Exam  Constitutional: He is oriented to person, place, and time. He appears well-developed and well-nourished.  HENT:  Head: Normocephalic and atraumatic.  Eyes: Conjunctivae are normal.  Neck: Neck supple. Muscular tenderness present. No spinous process tenderness present.    Tenderness along the right  side of the neck as diagrammed. Worse with movement of the right arm above the head. Limited range of motion secondary to pain. No midline cervical tenderness. No rash. Afebrile.  Cardiovascular: Normal rate.   Pulmonary/Chest: Effort normal.  Musculoskeletal: Normal range of motion.  Neurological: He is alert and oriented to person, place, and time.  Skin: Skin is warm and dry. No rash noted.  Psychiatric: He has a normal mood and affect.  Nursing note and vitals reviewed.   ED Course  Procedures (including critical care time) Labs Review Labs Reviewed - No data to display  Imaging Review No results found.   EKG Interpretation None      MDM   Final diagnoses:  Torticollis   Patient presents for neck pain that is worse with movement and movement of the right arm. He denies any fever, chills, or rash. I do not believe this is meningismus. This is most likely musculoskeletal. Return precautions were discussed with the patient as well as follow-up. Patient agrees with plan.   Nathan Glazier, PA-C 11/10/15 0222  Rolland Porter, MD 11/10/15 [phone]

## 2016-08-23 ENCOUNTER — Emergency Department (HOSPITAL_COMMUNITY): Payer: Medicaid Other

## 2016-08-23 ENCOUNTER — Encounter (HOSPITAL_COMMUNITY): Payer: Self-pay | Admitting: Emergency Medicine

## 2016-08-23 ENCOUNTER — Emergency Department (HOSPITAL_COMMUNITY)
Admission: EM | Admit: 2016-08-23 | Discharge: 2016-08-23 | Disposition: A | Discharge status: Home / Self Care | Payer: Medicaid Other | Attending: Emergency Medicine | Admitting: Emergency Medicine

## 2016-08-23 DIAGNOSIS — Z7982 Long term (current) use of aspirin: Secondary | ICD-10-CM | POA: Diagnosis not present

## 2016-08-23 DIAGNOSIS — W268XXA Contact with other sharp object(s), not elsewhere classified, initial encounter: Secondary | ICD-10-CM | POA: Diagnosis not present

## 2016-08-23 DIAGNOSIS — J45909 Unspecified asthma, uncomplicated: Secondary | ICD-10-CM | POA: Insufficient documentation

## 2016-08-23 DIAGNOSIS — Y939 Activity, unspecified: Secondary | ICD-10-CM | POA: Diagnosis not present

## 2016-08-23 DIAGNOSIS — Z79899 Other long term (current) drug therapy: Secondary | ICD-10-CM | POA: Diagnosis not present

## 2016-08-23 DIAGNOSIS — F17210 Nicotine dependence, cigarettes, uncomplicated: Secondary | ICD-10-CM | POA: Diagnosis not present

## 2016-08-23 DIAGNOSIS — Y999 Unspecified external cause status: Secondary | ICD-10-CM | POA: Diagnosis not present

## 2016-08-23 DIAGNOSIS — Z23 Encounter for immunization: Secondary | ICD-10-CM | POA: Insufficient documentation

## 2016-08-23 DIAGNOSIS — Y929 Unspecified place or not applicable: Secondary | ICD-10-CM | POA: Insufficient documentation

## 2016-08-23 DIAGNOSIS — S61215A Laceration without foreign body of left ring finger without damage to nail, initial encounter: Secondary | ICD-10-CM | POA: Diagnosis present

## 2016-08-23 MED ORDER — TETANUS-DIPHTH-ACELL PERTUSSIS 5-2.5-18.5 LF-MCG/0.5 IM SUSP
0.5000 mL | Freq: Once | INTRAMUSCULAR | Status: AC
  Administered 2016-08-23: 0.5 mL via INTRAMUSCULAR
  Filled 2016-08-23: qty 0.5

## 2016-08-23 MED ORDER — BACITRACIN ZINC 500 UNIT/GM EX OINT
TOPICAL_OINTMENT | CUTANEOUS | Status: AC
  Filled 2016-08-23: qty 0.9

## 2016-08-23 MED ORDER — BACITRACIN ZINC 500 UNIT/GM EX OINT: 1.0000 "application " | TOPICAL_OINTMENT | Freq: Two times a day (BID) | CUTANEOUS | 1 refills | Status: DC

## 2016-08-23 MED ORDER — LIDOCAINE HCL (PF) 1 % IJ SOLN
10.0000 mL | Freq: Once | INTRAMUSCULAR | Status: AC
  Administered 2016-08-23: 10 mL
  Filled 2016-08-23: qty 10

## 2016-08-23 NOTE — ED Triage Notes (Signed)
Pt c/o laceration to the left ring finger knuckle earlier today, bleeding controlled.

## 2016-08-23 NOTE — ED Provider Notes (Signed)
[place] DEPT Provider Note   CSN: [id number] Arrival date & time: 08/23/16  1940     History   Chief Complaint Chief Complaint  Patient presents with  . Extremity Laceration    HPI Nathan Waters is a 35 y.o. male.  Nathan Waters is a 35 y.o. Male who presents to the emergency department complaining of a laceration to his left ring finger sustained prior to arrival. Patient reports he was working on a car when his hand got pushed up against the brake line that he was repairing and sustained a laceration to his left ring finger PIP joint. This occurred about 4 hours prior to arrival. He is unsure of the date of his last tetanus shot. He denies fevers, numbness, tingling, weakness or other injury.   The history is provided by the patient. No language interpreter was used.    Past Medical History:  Diagnosis Date  . Asthma   . Coronary atherosclerosis of native coronary artery    BMS to mid circumflex 11/2011, LVEF 55-60%  . Essential hypertension, benign   . Migraine   . Mixed hyperlipidemia   . STEMI (ST elevation myocardial infarction) ([place])    Complicated by VF arrest 11/2011    Patient Active Problem List   Diagnosis Date Noted  . Tobacco use 04/09/2012  . Coronary atherosclerosis of native coronary artery 01/06/2012  . Mixed hyperlipidemia 01/06/2012  . Asthma 11/21/2011    Past Surgical History:  Procedure Laterality Date  . BACK SURGERY    . LEFT HEART CATHETERIZATION WITH CORONARY ANGIOGRAM N/A 11/21/2011   Procedure: LEFT HEART CATHETERIZATION WITH CORONARY ANGIOGRAM;  Surgeon: Peter M Martinique, MD;  Location: [facility] CATH LAB;  Service: Cardiovascular;  Laterality: N/A;  . PERCUTANEOUS CORONARY STENT INTERVENTION (PCI-S) N/A 11/21/2011   Procedure: PERCUTANEOUS CORONARY STENT INTERVENTION (PCI-S);  Surgeon: Peter M Martinique, MD;  Location: [facility] CATH LAB;  Service: Cardiovascular;  Laterality: N/A;  . SPLENECTOMY         Home Medications    Prior to  Admission medications   Medication Sig Start Date End Date Taking? Authorizing Provider  aspirin 325 MG EC tablet Take 325 mg by mouth daily.    Historical Provider, MD  bacitracin ointment Apply 1 application topically 2 (two) times daily. 08/23/16   Waynetta Pean, PA-C  methocarbamol (ROBAXIN) 500 MG tablet Take 1 tablet (500 mg total) by mouth 2 (two) times daily. 11/09/15   Hanna Patel-Mills, PA-C  metoprolol tartrate (LOPRESSOR) 25 MG tablet Take 25 mg by mouth 2 (two) times daily.    Historical Provider, MD  naproxen (NAPROSYN) 500 MG tablet Take 1 tablet (500 mg total) by mouth 2 (two) times daily. [id number]   Delora Fuel, MD  nitroGLYCERIN (NITROSTAT) 0.4 MG SL tablet Place 0.4 mg under the tongue every 5 (five) minutes as needed for chest pain.    Historical Provider, MD  orphenadrine (NORFLEX) 100 MG tablet Take 1 tablet (100 mg total) by mouth 2 (two) times daily. [id number]   Delora Fuel, MD  oxyCODONE-acetaminophen (PERCOCET/ROXICET) 5-325 MG tablet Take 2 tablets by mouth every 4 (four) hours as needed for severe pain. 11/09/15   Hanna Patel-Mills, PA-C  pravastatin (PRAVACHOL) 40 MG tablet Take 40 mg by mouth at bedtime.    Historical Provider, MD    Family History Family History  Problem Relation Age of Onset  . Malignant hyperthermia Mother   . Malignant hyperthermia Brother   . Heart disease Brother   . Heart  disease Father     Social History Social History  Substance Use Topics  . Smoking status: Current Every Day Smoker    Packs/day: 1.00    Types: Cigarettes  . Smokeless tobacco: Never Used  . Alcohol use 0.0 oz/week     Comment: Occasionally     Allergies   Penicillins   Review of Systems Review of Systems  Constitutional: Negative for fever.  Musculoskeletal: Positive for arthralgias.  Skin: Positive for wound.  Neurological: Negative for weakness and numbness.     Physical Exam Updated Vital Signs BP 144/86 (BP Location: Right Arm)   Pulse 64    Temp 97.7 F (36.5 C) (Oral)   Resp 20   Ht 5\' 11"  (1.803 m)   Wt 108.9 kg   SpO2 96%   BMI 33.47 kg/m   Physical Exam  Constitutional: He appears well-developed and well-nourished. No distress.  HENT:  Head: Normocephalic and atraumatic.  Eyes: Right eye exhibits no discharge. Left eye exhibits no discharge.  Cardiovascular: Normal rate, regular rhythm and intact distal pulses.   Bilateral radial pulses are intact. Good capillary refill to his left distal fingertips.  Pulmonary/Chest: Effort normal. No respiratory distress.  Musculoskeletal: Normal range of motion. He exhibits no tenderness or deformity.  Good strength with flexion and extension of his left fingers. No deformity.  Neurological: He is alert. Coordination normal.  Skin: Skin is warm and dry. Capillary refill takes less than 2 seconds. No rash noted. He is not diaphoretic. No erythema. No pallor.  2 cm laceration overlying his left fourth finger PIP joint. Bleeding is controlled. Good strength with flexion and extension of his left fingers. Good capillary refill.  Psychiatric: He has a normal mood and affect. His behavior is normal.  Nursing note and vitals reviewed.    ED Treatments / Results  Labs (all labs ordered are listed, but only abnormal results are displayed) Labs Reviewed - No data to display  EKG  EKG Interpretation None       Radiology Dg Finger Ring Left  Result Date: 08/23/2016 CLINICAL DATA:  Blunt trauma fourth digit with proximal laceration, initial encounter EXAM: LEFT RING FINGER 2+V COMPARISON:  None. FINDINGS: There is no evidence of fracture or dislocation. There is no evidence of arthropathy or other focal bone abnormality. Soft tissues are unremarkable. IMPRESSION: No acute abnormality noted. Electronically Signed   By: Inez Catalina M.D.   On: 08/23/2016 20:47    Procedures .Marland KitchenLaceration Repair Date/Time: 08/23/2016 9:02 PM Performed by: Waynetta Pean Authorized by: Waynetta Pean   Consent:    Consent obtained:  Verbal   Consent given by:  Patient   Risks discussed:  Infection, pain, need for additional repair and poor wound healing Laceration details:    Location:  Finger   Finger location:  L ring finger   Length (cm):  1.5 Repair type:    Repair type:  Simple Pre-procedure details:    Preparation:  Patient was prepped and draped in usual sterile fashion and imaging obtained to evaluate for foreign bodies Exploration:    Hemostasis achieved with:  Direct pressure   Wound exploration: entire depth of wound probed and visualized     Wound extent: no foreign bodies/material noted and no tendon damage noted   Treatment:    Area cleansed with:  Saline   Amount of cleaning:  Extensive   Irrigation solution:  Sterile saline   Irrigation volume:  500 ml   Irrigation method:  Pressure wash  Skin repair:    Repair method:  Sutures   Suture size:  4-0   Suture material:  Nylon   Suture technique:  Simple interrupted   Number of sutures:  2 Approximation:    Approximation:  Close Post-procedure details:    Dressing:  Antibiotic ointment and non-adherent dressing   Patient tolerance of procedure:  Tolerated well, no immediate complications   (including critical care time)  Medications Ordered in ED Medications  bacitracin 500 UNIT/GM ointment (not administered)  Tdap (BOOSTRIX) injection 0.5 mL (0.5 mLs Intramuscular Given 08/23/16 2007)  lidocaine (PF) (XYLOCAINE) 1 % injection 10 mL (10 mLs Infiltration Given 08/23/16 2007)     Initial Impression / Assessment and Plan / ED Course  I have reviewed the triage vital signs and the nursing notes.  Pertinent labs & imaging results that were available during my care of the patient were reviewed by me and considered in my medical decision making (see chart for details).  Clinical Course    This is a 35 y.o. Male who presents to the emergency department complaining of a laceration to his left ring  finger sustained prior to arrival. Patient reports he was working on a car when his hand got pushed up against the brake line that he was repairing and sustained a laceration to his left ring finger PIP joint.  On exam the patient is afebrile nontoxic appearing. He is a 1.5 cm laceration overlying his left ring finger PIP joint. No evidence of foreign body. Good strength with flexion and extension of his finger. He is neurovascularly intact. X-rays unremarkable. The wound was repaired by me and patient tolerated the procedure well.  Two 4-0 ethilon sutures were placed. I discussed wound care instructions. Advised to follow-up in 5-7 days for suture removal. Tdap updated in the ER.  I advised the patient to follow-up with their primary care provider this week. I advised the patient to return to the emergency department with new or worsening symptoms or new concerns. The patient verbalized understanding and agreement with plan.       Final Clinical Impressions(s) / ED Diagnoses   Final diagnoses:  Laceration of left ring finger without foreign body without damage to nail, initial encounter    New Prescriptions New Prescriptions   BACITRACIN OINTMENT    Apply 1 application topically 2 (two) times daily.     Waynetta Pean, PA-C 08/23/16 2107    Daleen Bo, MD 08/24/16 1328

## 2016-08-29 ENCOUNTER — Emergency Department (HOSPITAL_COMMUNITY)
Admission: EM | Admit: 2016-08-29 | Discharge: 2016-08-29 | Disposition: A | Discharge status: Home / Self Care | Payer: Medicaid Other | Attending: Emergency Medicine | Admitting: Emergency Medicine

## 2016-08-29 ENCOUNTER — Encounter (HOSPITAL_COMMUNITY): Payer: Self-pay

## 2016-08-29 DIAGNOSIS — I2510 Atherosclerotic heart disease of native coronary artery without angina pectoris: Secondary | ICD-10-CM | POA: Insufficient documentation

## 2016-08-29 DIAGNOSIS — I10 Essential (primary) hypertension: Secondary | ICD-10-CM | POA: Diagnosis not present

## 2016-08-29 DIAGNOSIS — Z4802 Encounter for removal of sutures: Secondary | ICD-10-CM | POA: Diagnosis present

## 2016-08-29 DIAGNOSIS — Z79899 Other long term (current) drug therapy: Secondary | ICD-10-CM | POA: Diagnosis not present

## 2016-08-29 DIAGNOSIS — F17210 Nicotine dependence, cigarettes, uncomplicated: Secondary | ICD-10-CM | POA: Diagnosis not present

## 2016-08-29 DIAGNOSIS — Z7982 Long term (current) use of aspirin: Secondary | ICD-10-CM | POA: Insufficient documentation

## 2016-08-29 DIAGNOSIS — J45909 Unspecified asthma, uncomplicated: Secondary | ICD-10-CM | POA: Diagnosis not present

## 2016-08-29 NOTE — ED Triage Notes (Signed)
Here to have stitches removed.

## 2016-08-29 NOTE — ED Notes (Signed)
Pt made aware to return if symptoms worsen or if any life threatening symptoms occur.   

## 2016-08-29 NOTE — ED Notes (Signed)
Stitches intact to left index finger, no drainage or bleeding, area warm and dry.

## 2016-08-29 NOTE — ED Provider Notes (Signed)
[place] DEPT Provider Note   CSN: [id number] Arrival date & time: 08/29/16  [id number]     History   Chief Complaint Chief Complaint  Patient presents with  . Suture / Staple Removal    HPI Nathan Waters is a 35 y.o. male.  The history is provided by the patient. No language interpreter was used.  Suture / Staple Removal  This is a new problem. The problem occurs constantly. Nothing aggravates the symptoms. Nothing relieves the symptoms.  Pt here for suture removal no complaints  Past Medical History:  Diagnosis Date  . Asthma   . Coronary atherosclerosis of native coronary artery    BMS to mid circumflex 11/2011, LVEF 55-60%  . Essential hypertension, benign   . Migraine   . Mixed hyperlipidemia   . STEMI (ST elevation myocardial infarction) ([place])    Complicated by VF arrest 11/2011    Patient Active Problem List   Diagnosis Date Noted  . Tobacco use 04/09/2012  . Coronary atherosclerosis of native coronary artery 01/06/2012  . Mixed hyperlipidemia 01/06/2012  . Asthma 11/21/2011    Past Surgical History:  Procedure Laterality Date  . BACK SURGERY    . LEFT HEART CATHETERIZATION WITH CORONARY ANGIOGRAM N/A 11/21/2011   Procedure: LEFT HEART CATHETERIZATION WITH CORONARY ANGIOGRAM;  Surgeon: Peter M Martinique, MD;  Location: [facility] CATH LAB;  Service: Cardiovascular;  Laterality: N/A;  . PERCUTANEOUS CORONARY STENT INTERVENTION (PCI-S) N/A 11/21/2011   Procedure: PERCUTANEOUS CORONARY STENT INTERVENTION (PCI-S);  Surgeon: Peter M Martinique, MD;  Location: [facility] CATH LAB;  Service: Cardiovascular;  Laterality: N/A;  . SPLENECTOMY         Home Medications    Prior to Admission medications   Medication Sig Start Date End Date Taking? Authorizing Provider  aspirin 325 MG EC tablet Take 325 mg by mouth daily.    Historical Provider, MD  bacitracin ointment Apply 1 application topically 2 (two) times daily. 08/23/16   Waynetta Pean, PA-C  methocarbamol (ROBAXIN) 500 MG tablet  Take 1 tablet (500 mg total) by mouth 2 (two) times daily. 11/09/15   Hanna Patel-Mills, PA-C  metoprolol tartrate (LOPRESSOR) 25 MG tablet Take 25 mg by mouth 2 (two) times daily.    Historical Provider, MD  naproxen (NAPROSYN) 500 MG tablet Take 1 tablet (500 mg total) by mouth 2 (two) times daily. [id number]   Delora Fuel, MD  nitroGLYCERIN (NITROSTAT) 0.4 MG SL tablet Place 0.4 mg under the tongue every 5 (five) minutes as needed for chest pain.    Historical Provider, MD  orphenadrine (NORFLEX) 100 MG tablet Take 1 tablet (100 mg total) by mouth 2 (two) times daily. [id number]   Delora Fuel, MD  oxyCODONE-acetaminophen (PERCOCET/ROXICET) 5-325 MG tablet Take 2 tablets by mouth every 4 (four) hours as needed for severe pain. 11/09/15   Hanna Patel-Mills, PA-C  pravastatin (PRAVACHOL) 40 MG tablet Take 40 mg by mouth at bedtime.    Historical Provider, MD    Family History Family History  Problem Relation Age of Onset  . Malignant hyperthermia Mother   . Malignant hyperthermia Brother   . Heart disease Brother   . Heart disease Father     Social History Social History  Substance Use Topics  . Smoking status: Current Every Day Smoker    Packs/day: 1.00    Types: Cigarettes  . Smokeless tobacco: Never Used  . Alcohol use 0.0 oz/week     Comment: Occasionally     Allergies   Penicillins  Review of Systems Review of Systems  All other systems reviewed and are negative.    Physical Exam Updated Vital Signs BP 128/81 (BP Location: Left Arm)   Pulse 68   Temp 98.3 F (36.8 C) (Oral)   Resp 16   Ht 5\' 11"  (1.803 m)   Wt 108.9 kg   SpO2 95%   BMI 33.47 kg/m   Physical Exam  Constitutional: He appears well-developed and well-nourished.  Musculoskeletal: Normal range of motion.  Healing laceration left index finger  Neurological: He is alert.  Skin: Skin is warm.  Psychiatric: He has a normal mood and affect.  Nursing note and vitals reviewed.    ED Treatments /  Results  Labs (all labs ordered are listed, but only abnormal results are displayed) Labs Reviewed - No data to display  EKG  EKG Interpretation None       Radiology No results found.  Procedures Procedures (including critical care time)  Medications Ordered in ED Medications - No data to display   Initial Impression / Assessment and Plan / ED Course  I have reviewed the triage vital signs and the nursing notes.  Pertinent labs & imaging results that were available during my care of the patient were reviewed by me and considered in my medical decision making (see chart for details).  Clinical Course     Sutures removed by me.  Final Clinical Impressions(s) / ED Diagnoses   Final diagnoses:  None    New Prescriptions New Prescriptions   No medications on file     Nathan Meadow, PA-C 08/29/16 [place], MD 08/29/16 [phone]

## 2017-01-09 ENCOUNTER — Emergency Department (HOSPITAL_COMMUNITY)
Admission: EM | Admit: 2017-01-09 | Discharge: 2017-01-09 | Disposition: A | Discharge status: Home / Self Care | Payer: Medicaid Other | Attending: Emergency Medicine | Admitting: Emergency Medicine

## 2017-01-09 ENCOUNTER — Encounter (HOSPITAL_COMMUNITY): Payer: Self-pay | Admitting: Emergency Medicine

## 2017-01-09 DIAGNOSIS — F17210 Nicotine dependence, cigarettes, uncomplicated: Secondary | ICD-10-CM | POA: Diagnosis not present

## 2017-01-09 DIAGNOSIS — Z79899 Other long term (current) drug therapy: Secondary | ICD-10-CM | POA: Insufficient documentation

## 2017-01-09 DIAGNOSIS — J45909 Unspecified asthma, uncomplicated: Secondary | ICD-10-CM | POA: Insufficient documentation

## 2017-01-09 DIAGNOSIS — R21 Rash and other nonspecific skin eruption: Secondary | ICD-10-CM

## 2017-01-09 DIAGNOSIS — I2510 Atherosclerotic heart disease of native coronary artery without angina pectoris: Secondary | ICD-10-CM | POA: Diagnosis not present

## 2017-01-09 DIAGNOSIS — I252 Old myocardial infarction: Secondary | ICD-10-CM | POA: Diagnosis not present

## 2017-01-09 DIAGNOSIS — Z7982 Long term (current) use of aspirin: Secondary | ICD-10-CM | POA: Insufficient documentation

## 2017-01-09 MED ORDER — SULFAMETHOXAZOLE-TRIMETHOPRIM 800-160 MG PO TABS: 1.0000 | ORAL_TABLET | Freq: Two times a day (BID) | ORAL | 0 refills | Status: AC

## 2017-01-09 MED ORDER — ACYCLOVIR 800 MG PO TABS: 800.0000 mg | ORAL_TABLET | Freq: Every day | ORAL | 0 refills | Status: DC

## 2017-01-09 MED ORDER — SULFAMETHOXAZOLE-TRIMETHOPRIM 800-160 MG PO TABS
1.0000 | ORAL_TABLET | Freq: Once | ORAL | Status: AC
  Administered 2017-01-09: 1 via ORAL
  Filled 2017-01-09: qty 2

## 2017-01-09 NOTE — Discharge Instructions (Signed)
Clean the area with mild soap and water and keep it bandaged.  Return here in 1-2 days if not improving

## 2017-01-09 NOTE — ED Triage Notes (Signed)
Pt c/o right arm pain from rash.

## 2017-01-10 NOTE — ED Provider Notes (Signed)
[place] DEPT Provider Note   CSN: [id number] Arrival date & time: 01/09/17  2014     History   Chief Complaint Chief Complaint  Patient presents with  . Rash    HPI Nathan Waters is a 36 y.o. male.  HPI   Nathan Waters is a 36 y.o. male who presents to the Emergency Department complaining of painful, itching rash to the right forearm.  He describes gradual onset of a "pimple" that erupted and crusted and then several smaller "bumps" to same area with surrounding redness.  He has been cleaning the area with  Peroxide. He denies swelling, numbness, fever, or malaise. Hx of multiple boils.   Past Medical History:  Diagnosis Date  . Asthma   . Coronary atherosclerosis of native coronary artery    BMS to mid circumflex 11/2011, LVEF 55-60%  . Essential hypertension, benign   . Migraine   . Mixed hyperlipidemia   . STEMI (ST elevation myocardial infarction) ([place])    Complicated by VF arrest 11/2011    Patient Active Problem List   Diagnosis Date Noted  . Tobacco use 04/09/2012  . Coronary atherosclerosis of native coronary artery 01/06/2012  . Mixed hyperlipidemia 01/06/2012  . Asthma 11/21/2011    Past Surgical History:  Procedure Laterality Date  . BACK SURGERY    . LEFT HEART CATHETERIZATION WITH CORONARY ANGIOGRAM N/A 11/21/2011   Procedure: LEFT HEART CATHETERIZATION WITH CORONARY ANGIOGRAM;  Surgeon: Peter M Martinique, MD;  Location: [facility] CATH LAB;  Service: Cardiovascular;  Laterality: N/A;  . PERCUTANEOUS CORONARY STENT INTERVENTION (PCI-S) N/A 11/21/2011   Procedure: PERCUTANEOUS CORONARY STENT INTERVENTION (PCI-S);  Surgeon: Peter M Martinique, MD;  Location: [place] CATH LAB;  Service: Cardiovascular;  Laterality: N/A;  . SPLENECTOMY         Home Medications    Prior to Admission medications   Medication Sig Start Date End Date Taking? Authorizing Provider  acyclovir (ZOVIRAX) 800 MG tablet Take 1 tablet (800 mg total) by mouth 5 (five) times daily. For 7 days  01/09/17   Aviyon Hocevar, PA-C  aspirin 325 MG EC tablet Take 325 mg by mouth daily.    Historical Provider, MD  bacitracin ointment Apply 1 application topically 2 (two) times daily. 08/23/16   Waynetta Pean, PA-C  methocarbamol (ROBAXIN) 500 MG tablet Take 1 tablet (500 mg total) by mouth 2 (two) times daily. 11/09/15   Hanna Patel-Mills, PA-C  metoprolol tartrate (LOPRESSOR) 25 MG tablet Take 25 mg by mouth 2 (two) times daily.    Historical Provider, MD  naproxen (NAPROSYN) 500 MG tablet Take 1 tablet (500 mg total) by mouth 2 (two) times daily. 34/19/62   Delora Fuel, MD  nitroGLYCERIN (NITROSTAT) 0.4 MG SL tablet Place 0.4 mg under the tongue every 5 (five) minutes as needed for chest pain.    Historical Provider, MD  orphenadrine (NORFLEX) 100 MG tablet Take 1 tablet (100 mg total) by mouth 2 (two) times daily. 22/97/98   Delora Fuel, MD  oxyCODONE-acetaminophen (PERCOCET/ROXICET) 5-325 MG tablet Take 2 tablets by mouth every 4 (four) hours as needed for severe pain. 11/09/15   Hanna Patel-Mills, PA-C  pravastatin (PRAVACHOL) 40 MG tablet Take 40 mg by mouth at bedtime.    Historical Provider, MD  sulfamethoxazole-trimethoprim (BACTRIM DS,SEPTRA DS) 800-160 MG tablet Take 1 tablet by mouth 2 (two) times daily. 01/09/17 01/16/17  Saurav Crumble, PA-C  sulfamethoxazole-trimethoprim (BACTRIM DS,SEPTRA DS) 800-160 MG tablet Take 1 tablet by mouth 2 (two) times daily. 01/09/17  01/16/17  Calvin Chura, PA-C    Family History Family History  Problem Relation Age of Onset  . Malignant hyperthermia Mother   . Malignant hyperthermia Brother   . Heart disease Brother   . Heart disease Father     Social History Social History  Substance Use Topics  . Smoking status: Current Every Day Smoker    Packs/day: 1.00    Types: Cigarettes  . Smokeless tobacco: Never Used  . Alcohol use 0.0 oz/week     Comment: Occasionally     Allergies   Penicillins   Review of Systems Review of Systems    Constitutional: Negative for activity change, appetite change, chills and fever.  HENT: Negative for facial swelling, sore throat and trouble swallowing.   Respiratory: Negative for chest tightness, shortness of breath and wheezing.   Musculoskeletal: Negative for neck pain and neck stiffness.  Skin: Positive for rash. Negative for wound.  Neurological: Negative for dizziness, weakness, numbness and headaches.  All other systems reviewed and are negative.    Physical Exam Updated Vital Signs BP (!) 147/76   Pulse 77   Temp 98 F (36.7 C)   Resp 18   Ht 5\' 9"  (1.753 m)   Wt 117.9 kg   SpO2 99%   BMI 38.40 kg/m   Physical Exam  Constitutional: He is oriented to person, place, and time. He appears well-developed and well-nourished. No distress.  HENT:  Head: Normocephalic and atraumatic.  Mouth/Throat: Oropharynx is clear and moist.  Neck: Normal range of motion. Neck supple.  Cardiovascular: Normal rate, regular rhythm and intact distal pulses.   No murmur heard. Pulmonary/Chest: Effort normal and breath sounds normal. No respiratory distress.  Musculoskeletal: Normal range of motion. He exhibits no edema or tenderness.  Pt has full ROM of the extremity.    Lymphadenopathy:    He has no cervical adenopathy.  Neurological: He is alert and oriented to person, place, and time. He has normal strength. No sensory deficit. He exhibits normal muscle tone. Coordination normal.  Skin: Skin is warm. Rash noted. There is erythema.  Several crusted papules of the mid to distal right forearm.  Few smaller, satellite lesions present.  Surrounding erythema also present.  No induration.    Nursing note and vitals reviewed.    ED Treatments / Results  Labs (all labs ordered are listed, but only abnormal results are displayed) Labs Reviewed - No data to display  EKG  EKG Interpretation None       Radiology No results found.  Procedures Procedures (including critical care  time)  Medications Ordered in ED Medications  sulfamethoxazole-trimethoprim (BACTRIM DS,SEPTRA DS) 800-160 MG per tablet 1 tablet (1 tablet Oral Given 01/09/17 2253)     Initial Impression / Assessment and Plan / ED Course  I have reviewed the triage vital signs and the nursing notes.  Pertinent labs & imaging results that were available during my care of the patient were reviewed by me and considered in my medical decision making (see chart for details).     Pt with localized rash to forearm.  Likely related to staph, but HSV also considered.  No obvious abscess.  NV intact.    Pt advised to d/c peroxide cleaning.  Rx for bactrim and acyclovir.  Return precautions discussed.  Pt reports unable to fill medication for 24 hrs, will dispense one bactrim tab to take in am.   Final Clinical Impressions(s) / ED Diagnoses   Final diagnoses:  Rash and  nonspecific skin eruption    New Prescriptions Discharge Medication List as of 01/09/2017 10:45 PM    START taking these medications   Details  acyclovir (ZOVIRAX) 800 MG tablet Take 1 tablet (800 mg total) by mouth 5 (five) times daily. For 7 days, Starting Mon 01/09/2017, Print    !! sulfamethoxazole-trimethoprim (BACTRIM DS,SEPTRA DS) 800-160 MG tablet Take 1 tablet by mouth 2 (two) times daily., Starting Mon 01/09/2017, Until Mon 01/16/2017, Print    !! sulfamethoxazole-trimethoprim (BACTRIM DS,SEPTRA DS) 800-160 MG tablet Take 1 tablet by mouth 2 (two) times daily., Starting Mon 01/09/2017, Until Mon 01/16/2017, Print     !! - Potential duplicate medications found. Please discuss with provider.       Kem Parkinson, PA-C 01/10/17 [place], MD 01/12/17 [phone]

## 2017-03-03 ENCOUNTER — Emergency Department (HOSPITAL_COMMUNITY): Payer: Medicaid Other

## 2017-03-03 ENCOUNTER — Emergency Department (HOSPITAL_COMMUNITY)
Admission: EM | Admit: 2017-03-03 | Discharge: 2017-03-03 | Disposition: A | Discharge status: Home / Self Care | Payer: Medicaid Other | Attending: Emergency Medicine | Admitting: Emergency Medicine

## 2017-03-03 ENCOUNTER — Encounter (HOSPITAL_COMMUNITY): Payer: Self-pay

## 2017-03-03 DIAGNOSIS — F17210 Nicotine dependence, cigarettes, uncomplicated: Secondary | ICD-10-CM | POA: Insufficient documentation

## 2017-03-03 DIAGNOSIS — Z7982 Long term (current) use of aspirin: Secondary | ICD-10-CM | POA: Diagnosis not present

## 2017-03-03 DIAGNOSIS — I2510 Atherosclerotic heart disease of native coronary artery without angina pectoris: Secondary | ICD-10-CM | POA: Insufficient documentation

## 2017-03-03 DIAGNOSIS — Z79899 Other long term (current) drug therapy: Secondary | ICD-10-CM | POA: Diagnosis not present

## 2017-03-03 DIAGNOSIS — J45909 Unspecified asthma, uncomplicated: Secondary | ICD-10-CM | POA: Insufficient documentation

## 2017-03-03 DIAGNOSIS — R0789 Other chest pain: Secondary | ICD-10-CM | POA: Insufficient documentation

## 2017-03-03 DIAGNOSIS — R0781 Pleurodynia: Secondary | ICD-10-CM | POA: Diagnosis present

## 2017-03-03 DIAGNOSIS — I10 Essential (primary) hypertension: Secondary | ICD-10-CM | POA: Insufficient documentation

## 2017-03-03 LAB — COMPREHENSIVE METABOLIC PANEL
ALK PHOS: 90 U/L (ref 38–126)
ALT: 31 U/L (ref 17–63)
AST: 26 U/L (ref 15–41)
Albumin: 4.3 g/dL (ref 3.5–5.0)
Anion gap: 7 (ref 5–15)
BUN: 12 mg/dL (ref 6–20)
CHLORIDE: 106 mmol/L (ref 101–111)
CO2: 23 mmol/L (ref 22–32)
CREATININE: 0.86 mg/dL (ref 0.61–1.24)
Calcium: 9.2 mg/dL (ref 8.9–10.3)
GFR calc Af Amer: >60 mL/min (ref >60)
GFR calc non Af Amer: >60 mL/min (ref >60)
GLUCOSE: 101 mg/dL — AB (ref 65–99)
Potassium: 3.7 mmol/L (ref 3.5–5.1)
Sodium: 136 mmol/L (ref 135–145)
Total Bilirubin: 0.7 mg/dL (ref 0.3–1.2)
Total Protein: 7.3 g/dL (ref 6.5–8.1)

## 2017-03-03 LAB — CBC WITH DIFFERENTIAL/PLATELET
BASOS ABS: 0 10*3/uL (ref 0.0–0.1)
Basophils Relative: 0 %
EOS PCT: 1 %
Eosinophils Absolute: 0.1 10*3/uL (ref 0.0–0.7)
HCT: 44.2 % (ref 39.0–52.0)
HEMOGLOBIN: 15.4 g/dL (ref 13.0–17.0)
LYMPHS ABS: 5.3 10*3/uL — AB (ref 0.7–4.0)
LYMPHS PCT: 28 %
MCH: 33.3 pg (ref 26.0–34.0)
MCHC: 34.8 g/dL (ref 30.0–36.0)
MCV: 95.7 fL (ref 78.0–100.0)
Monocytes Absolute: 1.2 10*3/uL — ABNORMAL HIGH (ref 0.1–1.0)
Monocytes Relative: 6 %
NEUTROS PCT: 65 %
Neutro Abs: 12.1 10*3/uL — ABNORMAL HIGH (ref 1.7–7.7)
PLATELETS: 296 10*3/uL (ref 150–400)
RBC: 4.62 MIL/uL (ref 4.22–5.81)
RDW: 14.2 % (ref 11.5–15.5)
WBC: 18.7 10*3/uL — ABNORMAL HIGH (ref 4.0–10.5)

## 2017-03-03 LAB — D-DIMER, QUANTITATIVE: D-Dimer, Quant: 0.38 ug/mL-FEU (ref 0.00–0.50)

## 2017-03-03 MED ORDER — METHOCARBAMOL 500 MG PO TABS: 500.0000 mg | ORAL_TABLET | Freq: Two times a day (BID) | ORAL | 0 refills | Status: DC

## 2017-03-03 MED ORDER — IBUPROFEN 800 MG PO TABS: 800.0000 mg | ORAL_TABLET | Freq: Three times a day (TID) | ORAL | 0 refills | Status: DC

## 2017-03-03 NOTE — ED Provider Notes (Signed)
[facility] DEPT Provider Note   CSN: [id number] Arrival date & time: 03/03/17  1659     History   Chief Complaint Chief Complaint  Patient presents with  . RIB PAIN    HPI Nathan Waters is a 36 y.o. male.  The history is provided by the patient. No language interpreter was used.  Chest Pain   This is a new problem. The problem occurs constantly. The problem has been gradually worsening. The pain is associated with exertion and coughing. The pain is present in the lateral region. The pain is moderate. The pain does not radiate. The symptoms are aggravated by deep breathing. Pertinent negatives include no cough. There are no known risk factors.  Procedure history is positive for cardiac catheterization.  Pt thinks he may have pulled a muscle in the right side of his chest.  Pt reports he has pain when he takes a deep breath.  Pt poijnts to right lower ribs as area of pain.  Pain with moving, pain with breathing.  Past Medical History:  Diagnosis Date  . Asthma   . Coronary atherosclerosis of native coronary artery    BMS to mid circumflex 11/2011, LVEF 55-60%  . Essential hypertension, benign   . Migraine   . Mixed hyperlipidemia   . STEMI (ST elevation myocardial infarction) ([place])    Complicated by VF arrest 11/2011    Patient Active Problem List   Diagnosis Date Noted  . Tobacco use 04/09/2012  . Coronary atherosclerosis of native coronary artery 01/06/2012  . Mixed hyperlipidemia 01/06/2012  . Asthma 11/21/2011    Past Surgical History:  Procedure Laterality Date  . BACK SURGERY    . LEFT HEART CATHETERIZATION WITH CORONARY ANGIOGRAM N/A 11/21/2011   Procedure: LEFT HEART CATHETERIZATION WITH CORONARY ANGIOGRAM;  Surgeon: Peter M Martinique, MD;  Location: [facility] CATH LAB;  Service: Cardiovascular;  Laterality: N/A;  . PERCUTANEOUS CORONARY STENT INTERVENTION (PCI-S) N/A 11/21/2011   Procedure: PERCUTANEOUS CORONARY STENT INTERVENTION (PCI-S);  Surgeon: Peter M Martinique, MD;   Location: [facility] CATH LAB;  Service: Cardiovascular;  Laterality: N/A;  . SPLENECTOMY         Home Medications    Prior to Admission medications   Medication Sig Start Date End Date Taking? Authorizing Provider  acyclovir (ZOVIRAX) 800 MG tablet Take 1 tablet (800 mg total) by mouth 5 (five) times daily. For 7 days 01/09/17   Kem Parkinson, PA-C  aspirin 325 MG EC tablet Take 325 mg by mouth daily.    [provider]  bacitracin ointment Apply 1 application topically 2 (two) times daily. 08/23/16   Waynetta Pean, PA-C  ibuprofen (ADVIL,MOTRIN) 800 MG tablet Take 1 tablet (800 mg total) by mouth 3 (three) times daily. 03/03/17   Fransico Meadow, PA-C  methocarbamol (ROBAXIN) 500 MG tablet Take 1 tablet (500 mg total) by mouth 2 (two) times daily. 03/03/17   Fransico Meadow, PA-C  metoprolol tartrate (LOPRESSOR) 25 MG tablet Take 25 mg by mouth 2 (two) times daily.    [provider]  nitroGLYCERIN (NITROSTAT) 0.4 MG SL tablet Place 0.4 mg under the tongue every 5 (five) minutes as needed for chest pain.    [provider]  orphenadrine (NORFLEX) 100 MG tablet Take 1 tablet (100 mg total) by mouth 2 (two) times daily. 31/54/00   Delora Fuel, MD  oxyCODONE-acetaminophen (PERCOCET/ROXICET) 5-325 MG tablet Take 2 tablets by mouth every 4 (four) hours as needed for severe pain. 11/09/15   Patel-Mills, Orvil Feil,  PA-C  pravastatin (PRAVACHOL) 40 MG tablet Take 40 mg by mouth at bedtime.    [provider]    Family History Family History  Problem Relation Age of Onset  . Malignant hyperthermia Mother   . Malignant hyperthermia Brother   . Heart disease Brother   . Heart disease Father     Social History Social History  Substance Use Topics  . Smoking status: Current Every Day Smoker    Packs/day: 1.00    Types: Cigarettes  . Smokeless tobacco: Never Used  . Alcohol use 0.0 oz/week     Comment: Occasionally     Allergies   Penicillins   Review of  Systems Review of Systems  Respiratory: Negative for cough.   Cardiovascular: Positive for chest pain.  All other systems reviewed and are negative.    Physical Exam Updated Vital Signs BP 120/70 (BP Location: Left Arm)   Pulse 80   Temp 98 F (36.7 C) (Oral)   Resp 18   Ht 5\' 11"  (1.803 m)   Wt 240 lb (108.9 kg)   SpO2 96%   BMI 33.47 kg/m   Physical Exam  Constitutional: He appears well-developed and well-nourished.  HENT:  Head: Normocephalic and atraumatic.  Eyes: Conjunctivae are normal.  Neck: Neck supple.  Cardiovascular: Normal rate, regular rhythm and normal heart sounds.   No murmur heard. Pulmonary/Chest: Effort normal and breath sounds normal. No respiratory distress.  Abdominal: Soft. There is no tenderness.  Musculoskeletal: He exhibits no edema.  Neurological: He is alert.  Skin: Skin is warm and dry.  Psychiatric: He has a normal mood and affect.  Nursing note and vitals reviewed.    ED Treatments / Results  Labs (all labs ordered are listed, but only abnormal results are displayed) Labs Reviewed  CBC WITH DIFFERENTIAL/PLATELET - Abnormal; Notable for the following:       Result Value   WBC 18.7 (*)    Neutro Abs 12.1 (*)    Lymphs Abs 5.3 (*)    Monocytes Absolute 1.2 (*)    All other components within normal limits  COMPREHENSIVE METABOLIC PANEL - Abnormal; Notable for the following:    Glucose, Bld 101 (*)    All other components within normal limits  D-DIMER, QUANTITATIVE (NOT AT [facility])    EKG  EKG Interpretation None       Radiology Dg Chest 2 View  Result Date: 03/03/2017 CLINICAL DATA:  Right-sided rib pain.  No injury. EXAM: CHEST  2 VIEW COMPARISON:  04/01/2015 FINDINGS: Lungs are adequately inflated without focal consolidation or effusion. Cardiomediastinal silhouette is within normal. Bones and soft tissues are unremarkable. IMPRESSION: No active cardiopulmonary disease. Electronically Signed   By: Marin Olp M.D.   On:  03/03/2017 17:41    Procedures Procedures (including critical care time)  Medications Ordered in ED Medications - No data to display   Initial Impression / Assessment and Plan / ED Course  I have reviewed the triage vital signs and the nursing notes.  Pertinent labs & imaging results that were available during my care of the patient were reviewed by me and considered in my medical decision making (see chart for details).     Pain is right lower chest,  I doubt cardiac,  No fever,  Pt given rx for robaxin and ibuprofen to help with pain.  No Pe risk and negative ddimer.  Final Clinical Impressions(s) / ED Diagnoses   Final diagnoses:  Chest wall pain  New Prescriptions Discharge Medication List as of 03/03/2017  7:35 PM    START taking these medications   Details  ibuprofen (ADVIL,MOTRIN) 800 MG tablet Take 1 tablet (800 mg total) by mouth 3 (three) times daily., Starting Fri 03/03/2017, Print      An After Visit Summary was printed and given to the patient.    Sidney Ace 03/03/17 2135    Forde Dandy, MD 03/03/17 [phone]

## 2017-03-03 NOTE — ED Notes (Signed)
Pt alert and oriented x 4. Stable gait. Pt given discharge papers/prescriptions. Pt told to stop by registration to complete any additional paperwork. Pt left the department with no further questions. 

## 2017-03-03 NOTE — Discharge Instructions (Signed)
Return if any problems.  See your Physician for recheck  °

## 2017-03-03 NOTE — ED Triage Notes (Addendum)
Patient states that he is having right rib pain, that increases when he takes a deep breath.  No known injury.

## 2017-03-27 ENCOUNTER — Emergency Department (HOSPITAL_COMMUNITY)
Admission: EM | Admit: 2017-03-27 | Discharge: 2017-03-27 | Disposition: A | Discharge status: Home / Self Care | Payer: Medicaid Other | Attending: Emergency Medicine | Admitting: Emergency Medicine

## 2017-03-27 ENCOUNTER — Encounter (HOSPITAL_COMMUNITY): Payer: Self-pay | Admitting: *Deleted

## 2017-03-27 DIAGNOSIS — I2510 Atherosclerotic heart disease of native coronary artery without angina pectoris: Secondary | ICD-10-CM | POA: Insufficient documentation

## 2017-03-27 DIAGNOSIS — J029 Acute pharyngitis, unspecified: Secondary | ICD-10-CM | POA: Diagnosis present

## 2017-03-27 DIAGNOSIS — J45909 Unspecified asthma, uncomplicated: Secondary | ICD-10-CM | POA: Diagnosis not present

## 2017-03-27 DIAGNOSIS — I10 Essential (primary) hypertension: Secondary | ICD-10-CM | POA: Insufficient documentation

## 2017-03-27 DIAGNOSIS — F17210 Nicotine dependence, cigarettes, uncomplicated: Secondary | ICD-10-CM | POA: Insufficient documentation

## 2017-03-27 LAB — RAPID STREP SCREEN (MED CTR MEBANE ONLY): STREPTOCOCCUS, GROUP A SCREEN (DIRECT): NEGATIVE

## 2017-03-27 MED ORDER — DEXAMETHASONE 4 MG PO TABS
10.0000 mg | ORAL_TABLET | ORAL | Status: AC
  Administered 2017-03-27: 10 mg via ORAL
  Filled 2017-03-27: qty 3

## 2017-03-27 MED ORDER — OXYCODONE-ACETAMINOPHEN 5-325 MG PO TABS: 1.0000 | ORAL_TABLET | ORAL | 0 refills | Status: DC | PRN

## 2017-03-27 NOTE — ED Triage Notes (Signed)
Pt c/o left side throat pain x one week with worsening of pain; pt states the pain was so bad tonight he was unable to sleep

## 2017-03-27 NOTE — ED Provider Notes (Signed)
[place] DEPT Provider Note   CSN: [id number] Arrival date & time: 03/27/17  0514     History   Chief Complaint Chief Complaint  Patient presents with  . Sore Throat    HPI Nathan Waters is a 36 y.o. male.  The history is provided by the patient.  He complains of a sore throat for the past week. Pain is primarily on the left side of his throat, and has been getting worse. It hurts to swallow. He rates pain at 8/10 at rest, and 10/10 when he tries to swallow. He denies fever or chills or sweats. Denies rhinorrhea or cough. There has been a sick contact with a sore throat, but they have gotten better.  Past Medical History:  Diagnosis Date  . Asthma   . Coronary atherosclerosis of native coronary artery    BMS to mid circumflex 11/2011, LVEF 55-60%  . Essential hypertension, benign   . Migraine   . Mixed hyperlipidemia   . STEMI (ST elevation myocardial infarction) ([place])    Complicated by VF arrest 11/2011    Patient Active Problem List   Diagnosis Date Noted  . Tobacco use 04/09/2012  . Coronary atherosclerosis of native coronary artery 01/06/2012  . Mixed hyperlipidemia 01/06/2012  . Asthma 11/21/2011    Past Surgical History:  Procedure Laterality Date  . BACK SURGERY    . LEFT HEART CATHETERIZATION WITH CORONARY ANGIOGRAM N/A 11/21/2011   Procedure: LEFT HEART CATHETERIZATION WITH CORONARY ANGIOGRAM;  Surgeon: Peter M Martinique, MD;  Location[facility] CATH LAB;  Service: Cardiovascular;  Laterality: N/A;  . PERCUTANEOUS CORONARY STENT INTERVENTION (PCI-S) N/A 11/21/2011   Procedure: PERCUTANEOUS CORONARY STENT INTERVENTION (PCI-S);  Surgeon: Peter M Martinique, MD;  Location: [facility] C[facility] LAB;  Service: Cardiovascular;  Laterality: N/A;  . SPLENECTOMY         Home Medications    Prior to Admission medications   Medication Sig Start Date End Date Taking? Authorizing Provider  nitroGLYCERIN (NITROSTAT) 0.4 MG SL tablet Place 0.4 mg under the tongue every 5 (five) minutes as  needed for chest pain.    [provider]    Family History Family History  Problem Relation Age of Onset  . Malignant hyperthermia Mother   . Malignant hyperthermia Brother   . Heart disease Brother   . Heart disease Father     Social History Social History  Substance Use Topics  . Smoking status: Current Every Day Smoker    Packs/day: 1.00    Types: Cigarettes  . Smokeless tobacco: Never Used  . Alcohol use 0.0 oz/week     Comment: Occasionally     Allergies   Penicillins   Review of Systems Review of Systems  All other systems reviewed and are negative.    Physical Exam Updated Vital Signs BP 133/78 (BP Location: Left Arm)   Pulse 79   Temp 98.1 F (36.7 C) (Oral)   Resp 18   Ht 5\' 11"  (1.803 m)   Wt 108.9 kg (240 lb)   SpO2 94%   BMI 33.47 kg/m   Physical Exam  Nursing note and vitals reviewed.  36 year old male, resting comfortably and in no acute distress. Vital signs are normal. Oxygen saturation is 94%, which is normal. Head is normocephalic and atraumatic. PERRLA, EOMI. Oropharynx shows mild to moderate tonsillar hypertrophy with exudate present. Neck is nontender and supple without adenopathy or JVD. Back is nontender and there is no CVA tenderness. Lungs are clear without rales, wheezes,  or rhonchi. Chest is nontender. Heart has regular rate and rhythm without murmur. Abdomen is soft, flat, nontender without masses or hepatosplenomegaly and peristalsis is normoactive. Extremities have no cyanosis or edema, full range of motion is present. Skin is warm and dry without rash. Neurologic: Mental status is normal, cranial nerves are intact, there are no motor or sensory deficits.  ED Treatments / Results  Labs (all labs ordered are listed, but only abnormal results are displayed) Labs Reviewed  RAPID STREP SCREEN (NOT AT [facility])  CULTURE, GROUP A STREP [facility])    Procedures Procedures (including critical care time)  Medications  Ordered in ED Medications  dexamethasone (DECADRON) tablet 10 mg (not administered)     Initial Impression / Assessment and Plan / ED Course  I have reviewed the triage vital signs and the nursing notes.  Pertinent lab results that were available during my care of the patient were reviewed by me and considered in my medical decision making (see chart for details).  Pharyngitis/tonsillitis with negative strep screen. Old records are reviewed, and he has no relevant past visits. He is given dose of dexamethasone and discharged with a prescription for small number of oxycodone-acetaminophen to take at night so he can rest. Strep culture is pending. He is referred to ENT for follow-up. Advised to stop smoking.  Final Clinical Impressions(s) / ED Diagnoses   Final diagnoses:  Viral pharyngitis    New Prescriptions New Prescriptions   OXYCODONE-ACETAMINOPHEN (PERCOCET) 5-325 MG TABLET    Take 1 tablet by mouth every 4 (four) hours as needed for moderate pain.     Delora Fuel, MD 03/54/65 [phone]

## 2017-03-27 NOTE — Discharge Instructions (Signed)
Return if symptoms are getting worse - especially if it gets to the point where you cannot swallow.

## 2017-03-29 LAB — CULTURE, GROUP A STREP (THRC)

## 2017-04-26 NOTE — Progress Notes (Signed)
Cardiology Office Note  Date: 04/27/2017   ID: Nathan Waters, Nathan Waters 05/04/1981, MRN [id number]  PCP: [facility], [facility] Cardiologist: Rozann Lesches, MD   Chief Complaint  Patient presents with  . Coronary Artery Disease    History of Present Illness: Nathan Waters is a 36 y.o. male referred for cardiology consultation by Ms. House NP with the [facility] to establish follow-up of CAD. He was last seen in our office back in April 2015 and has not returned for scheduled follow-up. He is here today with his wife. He does not report any obvious angina symptoms. At this point he is not taking any regular cardiac medications. 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 The only symptom he does report his having an unusual feeling in his chest and unease after drinking a high caffeine energy drink. This has not been a recurring symptom however. He has had no palpitations or syncope.  Today we discussed resuming aspirin daily, also provided him a refill for a fresh bottle of nitroglycerin. We discussed getting a follow-up lipid panel and also a GXT. BMS intervention to the circumflex was in 2013.  I personally reviewed his ECG today which shows normal sinus rhythm.  Past Medical History:  Diagnosis Date  . Asthma   . Coronary atherosclerosis of native coronary artery    BMS to mid circumflex 11/2011, LVEF 55-60%  . Essential hypertension, benign   . Migraine   . Mixed hyperlipidemia   . STEMI (ST elevation myocardial infarction) ([place])    Complicated by VF arrest 11/2011    Past Surgical History:  Procedure Laterality Date  . BACK SURGERY    . LEFT HEART CATHETERIZATION WITH CORONARY ANGIOGRAM N/A 11/21/2011   Procedure: LEFT HEART CATHETERIZATION WITH CORONARY ANGIOGRAM;  Surgeon: Peter M Martinique, MD;  Location: [facility] CATH LAB;  Service: Cardiovascular;  Laterality: N/A;  . PERCUTANEOUS CORONARY STENT INTERVENTION  (PCI-S) N/A 11/21/2011   Procedure: PERCUTANEOUS CORONARY STENT INTERVENTION (PCI-S);  Surgeon: Peter M Martinique, MD;  Location: [facility] CATH LAB;  Service: Cardiovascular;  Laterality: N/A;  . SPLENECTOMY      Current Outpatient Prescriptions  Medication Sig Dispense Refill  . aspirin EC 81 MG tablet Take 81 mg by mouth daily.    . nitroGLYCERIN (NITROSTAT) 0.4 MG SL tablet Place 1 tablet (0.4 mg total) under the tongue every 5 (five) minutes as needed for chest pain. 25 tablet 3   No current facility-administered medications for this visit.    Allergies:  Penicillins   Social History: The patient  reports that he has been smoking Cigarettes.  He has been smoking about 1.00 pack per day. He has never used smokeless tobacco. He reports that he drinks alcohol. He reports that he does not use drugs.   Family History: The patient's family history includes Heart disease in his brother and father; Malignant hyperthermia in his brother and mother.   ROS:  Please see the history of present illness. Otherwise, complete review of systems is positive for none.  All other systems are reviewed and negative.   Physical Exam: VS:  BP 124/78 (BP Location: Left Arm)   Pulse 80   Ht 5\' 11"  (1.803 m)   Wt 255 lb (115.7 kg)   SpO2 96%   BMI 35.57 kg/m , BMI Body mass index is 35.57 kg/m.  Wt Readings from Last 3 Encounters:  04/27/17 255 lb (115.7 kg)  03/27/17 240 lb (  108.9 kg)  03/03/17 240 lb (108.9 kg)    Overweight male, appears comfortable at rest. HEENT: Conjunctiva and lids normal, oropharynx clear, lip piercings. Neck: Supple, no elevated JVP or carotid bruits, no thyromegaly. Lungs: Clear to auscultation, nonlabored breathing at rest. Cardiac: Regular rate and rhythm, no S3 or significant systolic murmur, no pericardial rub. Abdomen: Soft, nontender, bowel sounds present. Extremities: No pitting edema, distal pulses 2+. Skin: Warm and dry. Musculoskeletal: No kyphosis. Neuropsychiatric:  Alert and oriented x3, affect grossly appropriate.  ECG: I personally reviewed the tracing from 04/18/2014 which showed sinus rhythm.  Recent Labwork: 03/03/2017: ALT 31; AST 26; BUN 12; Creatinine, Ser 0.86; Hemoglobin 15.4; Platelets 296; Potassium 3.7; Sodium 136   Other Studies Reviewed Today:  Echocardiogram 11/22/2011: Study Conclusions  - Left ventricle: The cavity size was mildly dilated. Wall thickness was normal. Systolic function was normal. The estimated ejection fraction was in the range of 55% to 60%. There is hypokinesis of the basal-mid posterolateral myocardium. - Left atrium: The atrium was mildly dilated. - Atrial septum: There was an atrial septal aneurysm.  CXR 03/03/2017: FINDINGS: Lungs are adequately inflated without focal consolidation or effusion. Cardiomediastinal silhouette is within normal. Bones and soft tissues are unremarkable.  IMPRESSION: No active cardiopulmonary disease.  Assessment and Plan:  1. CAD status post BMS to the circumflex in 2013 with normal LVEF. I reviewed his follow-up ECG. We discussed resuming aspirin 81 mg daily, provide a refill for a fresh bottle of nitroglycerin. We will obtain a GXT for scheme follow-up testing anticipate at least a yearly follow-up visit.  2. Hyperlipidemia by prior history. He was on Pravachol in the past. Lipid panel and LFTs will be obtained for baseline with recommendations from there.  3. History of hypertension, blood pressure is normal today. [facility]-up with the [facility]. Discussed salt restriction.  4. History of tobacco abuse. We discussed smoking cessation.  Current medicines were reviewed with the patient today.   Orders Placed This Encounter  Procedures  . Lipid Profile  . Hepatic function panel  . Exercise Tolerance Test  . EKG 12-Lead    Disposition: Follow-up in one year, sooner if needed.  Signed, Satira Sark, MD, [facility] 04/27/2017 3:22 PM    [place] [facility] [address], [place], [place] [address] Phone: [phone]; Fax: [phone]

## 2017-04-27 ENCOUNTER — Encounter: Payer: Self-pay | Admitting: Cardiology

## 2017-04-27 ENCOUNTER — Ambulatory Visit (INDEPENDENT_AMBULATORY_CARE_PROVIDER_SITE_OTHER): Payer: Medicaid Other | Admitting: Cardiology

## 2017-04-27 VITALS — BP 124/78 | HR 80 | Ht 71.0 in | Wt 255.0 lb

## 2017-04-27 DIAGNOSIS — E782 Mixed hyperlipidemia: Secondary | ICD-10-CM

## 2017-04-27 DIAGNOSIS — I2510 Atherosclerotic heart disease of native coronary artery without angina pectoris: Secondary | ICD-10-CM

## 2017-04-27 DIAGNOSIS — Z720 Tobacco use: Secondary | ICD-10-CM

## 2017-04-27 DIAGNOSIS — I10 Essential (primary) hypertension: Secondary | ICD-10-CM

## 2017-04-27 MED ORDER — NITROGLYCERIN 0.4 MG SL SUBL: 0.4000 mg | SUBLINGUAL_TABLET | SUBLINGUAL | 3 refills | Status: DC | PRN

## 2017-04-27 NOTE — Patient Instructions (Signed)
Your physician wants you to follow-up in:  1 year with Dr Ferne Reus will receive a reminder letter in the mail two months in advance. If you don't receive a letter, please call our office to schedule the follow-up appointment.     Start aspirin 81 mg daily    Your physician recommends that you return for lab work in: lipids, lft's    Your physician has requested that you have an exercise tolerance test. For further information please visit [email or web address]. Please also follow instruction sheet, as given.       Thank you for choosing [facility] !

## 2017-05-05 ENCOUNTER — Telehealth: Payer: Self-pay

## 2017-05-05 LAB — HEPATIC FUNCTION PANEL
ALT: 24 U/L (ref 9–46)
AST: 19 U/L (ref 10–40)
Albumin: 3.8 g/dL (ref 3.6–5.1)
Alkaline Phosphatase: 95 U/L (ref 40–115)
BILIRUBIN INDIRECT: 0.5 mg/dL (ref 0.2–1.2)
Bilirubin, Direct: 0.1 mg/dL (ref <=0.2)
TOTAL PROTEIN: 6.5 g/dL (ref 6.1–8.1)
Total Bilirubin: 0.6 mg/dL (ref 0.2–1.2)

## 2017-05-05 LAB — LIPID PANEL
CHOLESTEROL: 151 mg/dL (ref <200)
HDL: 31 mg/dL — ABNORMAL LOW (ref >40)
LDL Cholesterol: 102 mg/dL — ABNORMAL HIGH (ref <100)
TRIGLYCERIDES: 90 mg/dL (ref <150)
Total CHOL/HDL Ratio: 4.9 Ratio (ref <5.0)
VLDL: 18 mg/dL (ref <30)

## 2017-05-05 MED ORDER — PRAVASTATIN SODIUM 20 MG PO TABS: 20.0000 mg | ORAL_TABLET | Freq: Every evening | ORAL | 3 refills | Status: DC

## 2017-05-05 NOTE — Telephone Encounter (Signed)
-----   Message from Satira Sark, MD sent at 05/05/2017  4:20 PM EDT ----- Results reviewed. LDL 102 (previously 50) and AST/ALT are normal. Would start Pravachol 20 mg daily. A copy of this test should be forwarded to [facility], [facility].

## 2017-05-05 NOTE — Telephone Encounter (Signed)
Pt notified of labs, escribed pravachol to [facility]

## 2017-05-12 ENCOUNTER — Ambulatory Visit (HOSPITAL_COMMUNITY)
Admission: RE | Admit: 2017-05-12 | Discharge: 2017-05-12 | Disposition: A | Discharge status: Home / Self Care | Payer: Medicaid Other | Source: Ambulatory Visit | Attending: Cardiology | Admitting: Cardiology

## 2017-05-12 DIAGNOSIS — I2510 Atherosclerotic heart disease of native coronary artery without angina pectoris: Secondary | ICD-10-CM | POA: Diagnosis not present

## 2017-05-12 DIAGNOSIS — R9439 Abnormal result of other cardiovascular function study: Secondary | ICD-10-CM | POA: Insufficient documentation

## 2017-05-12 LAB — EXERCISE TOLERANCE TEST
CHL CUP MPHR: 186 {beats}/min
CHL CUP RESTING HR STRESS: 75 {beats}/min
CHL RATE OF PERCEIVED EXERTION: 19
Estimated workload: 7.4 METS
Exercise duration (min): 6 min
Exercise duration (sec): 8 s
Peak HR: 153 {beats}/min
Percent HR: 82 %

## 2017-06-15 ENCOUNTER — Emergency Department (HOSPITAL_COMMUNITY)
Admission: EM | Admit: 2017-06-15 | Discharge: 2017-06-15 | Disposition: A | Discharge status: Home / Self Care | Payer: Medicaid Other | Attending: Emergency Medicine | Admitting: Emergency Medicine

## 2017-06-15 ENCOUNTER — Encounter (HOSPITAL_COMMUNITY): Payer: Self-pay | Admitting: *Deleted

## 2017-06-15 DIAGNOSIS — J45909 Unspecified asthma, uncomplicated: Secondary | ICD-10-CM | POA: Diagnosis not present

## 2017-06-15 DIAGNOSIS — K029 Dental caries, unspecified: Secondary | ICD-10-CM | POA: Insufficient documentation

## 2017-06-15 DIAGNOSIS — K0889 Other specified disorders of teeth and supporting structures: Secondary | ICD-10-CM | POA: Insufficient documentation

## 2017-06-15 DIAGNOSIS — I2510 Atherosclerotic heart disease of native coronary artery without angina pectoris: Secondary | ICD-10-CM | POA: Diagnosis not present

## 2017-06-15 DIAGNOSIS — Z79899 Other long term (current) drug therapy: Secondary | ICD-10-CM | POA: Insufficient documentation

## 2017-06-15 DIAGNOSIS — I10 Essential (primary) hypertension: Secondary | ICD-10-CM | POA: Insufficient documentation

## 2017-06-15 DIAGNOSIS — Z7982 Long term (current) use of aspirin: Secondary | ICD-10-CM | POA: Diagnosis not present

## 2017-06-15 DIAGNOSIS — F17210 Nicotine dependence, cigarettes, uncomplicated: Secondary | ICD-10-CM | POA: Insufficient documentation

## 2017-06-15 MED ORDER — ONDANSETRON HCL 4 MG PO TABS
4.0000 mg | ORAL_TABLET | Freq: Once | ORAL | Status: AC
  Administered 2017-06-15: 4 mg via ORAL
  Filled 2017-06-15: qty 1

## 2017-06-15 MED ORDER — CLINDAMYCIN HCL 150 MG PO CAPS
300.0000 mg | ORAL_CAPSULE | Freq: Once | ORAL | Status: AC
  Administered 2017-06-15: 300 mg via ORAL
  Filled 2017-06-15: qty 2

## 2017-06-15 MED ORDER — CLINDAMYCIN HCL 300 MG PO CAPS: ORAL_CAPSULE | ORAL | 0 refills | Status: DC

## 2017-06-15 NOTE — Discharge Instructions (Signed)
Your vital signs within normal limits at this time. Please see your dentist on September 19 or earlier if possible. Please use clindamycin 3 times daily with food. Use ibuprofen every 6 hours for pain and discomfort. May use Tylenol in between the ibuprofen doses if needed.

## 2017-06-15 NOTE — ED Provider Notes (Signed)
[facility] DEPT Provider Note   CSN: [id number] Arrival date & time: 06/15/17  2142     History   Chief Complaint Chief Complaint  Patient presents with  . Dental Pain    HPI Nathan Waters is a 36 y.o. male.  Patient is a 36 year old male who presents to the emergency department because of dental pain.  This problem started 3 days ago. He the patient has pain primarily of the left lower jaw. He notes a cavity in this area. He has not had fever or chills to be reported. He has no difficulty with swallowing or speaking. Patient is not had any recent operations or procedures involving his mouth. There's been no trauma to be reported. He states that the pain is getting so severe that it is interfering with his rest and he presents now for assistance with the pain and discomfort. He attempted to get an appointment with his dentist, but they cannot see them until September 19. Nothing makes the pain any better, and nothing makes it any worse.      Past Medical History:  Diagnosis Date  . Asthma   . Coronary atherosclerosis of native coronary artery    BMS to mid circumflex 11/2011, LVEF 55-60%  . Essential hypertension, benign   . Migraine   . Mixed hyperlipidemia   . STEMI (ST elevation myocardial infarction) ([place])    Complicated by VF arrest 11/2011    Patient Active Problem List   Diagnosis Date Noted  . Tobacco use 04/09/2012  . Coronary atherosclerosis of native coronary artery 01/06/2012  . Mixed hyperlipidemia 01/06/2012  . Asthma 11/21/2011    Past Surgical History:  Procedure Laterality Date  . BACK SURGERY    . LEFT HEART CATHETERIZATION WITH CORONARY ANGIOGRAM N/A 11/21/2011   Procedure: LEFT HEART CATHETERIZATION WITH CORONARY ANGIOGRAM;  Surgeon: Peter M Martinique, MD;  Location: [facility] CATH LAB;  Service: Cardiovascular;  Laterality: N/A;  . PERCUTANEOUS CORONARY STENT INTERVENTION (PCI-S) N/A 11/21/2011   Procedure: PERCUTANEOUS CORONARY STENT INTERVENTION  (PCI-S);  Surgeon: Peter M Martinique, MD;  Location: [facility] CATH LAB;  Service: Cardiovascular;  Laterality: N/A;  . SPLENECTOMY         Home Medications    Prior to Admission medications   Medication Sig Start Date End Date Taking? Authorizing Provider  aspirin EC 81 MG tablet Take 81 mg by mouth daily.    [provider]  nitroGLYCERIN (NITROSTAT) 0.4 MG SL tablet Place 1 tablet (0.4 mg total) under the tongue every 5 (five) minutes as needed for chest pain. 04/27/17   Satira Sark, MD  pravastatin (PRAVACHOL) 20 MG tablet Take 1 tablet (20 mg total) by mouth every evening. 05/05/17 08/03/17  Satira Sark, MD    Family History Family History  Problem Relation Age of Onset  . Malignant hyperthermia Mother   . Malignant hyperthermia Brother   . Heart disease Brother   . Heart disease Father     Social History Social History  Substance Use Topics  . Smoking status: Current Every Day Smoker    Packs/day: 1.00    Types: Cigarettes  . Smokeless tobacco: Never Used  . Alcohol use 0.0 oz/week     Comment: Occasionally     Allergies   Penicillins   Review of Systems Review of Systems  Constitutional: Negative for activity change.       All ROS Neg except as noted in HPI  HENT: Positive for dental problem. Negative for nosebleeds.  Eyes: Negative for photophobia and discharge.  Respiratory: Negative for cough, shortness of breath and wheezing.   Cardiovascular: Negative for chest pain and palpitations.  Gastrointestinal: Negative for abdominal pain and blood in stool.  Genitourinary: Negative for dysuria, frequency and hematuria.  Musculoskeletal: Negative for arthralgias, back pain and neck pain.  Skin: Negative.   Neurological: Negative for dizziness, seizures and speech difficulty.  Psychiatric/Behavioral: Negative for confusion and hallucinations.     Physical Exam Updated Vital Signs BP (!) 143/87 (BP Location: Right Arm)   Pulse 92   Temp 98.1  F (36.7 C) (Oral)   Resp 18   Ht 5\' 11"  (1.803 m)   Wt 108.9 kg (240 lb)   SpO2 95%   BMI 33.47 kg/m   Physical Exam  Constitutional: He is oriented to person, place, and time. He appears well-developed and well-nourished.  Non-toxic appearance.  HENT:  Head: Normocephalic.  Right Ear: Tympanic membrane and external ear normal.  Left Ear: Tympanic membrane and external ear normal.  Multiple dental caries present. Left greater than right. The oropharynx is clear, and the uvula is in the midline. There is no swelling under the tongue. Speech is understandable. No drooling or difficulty managing secretions.  Eyes: Pupils are equal, round, and reactive to light. EOM and lids are normal.  Neck: Normal range of motion. Neck supple. Carotid bruit is not present.  Cardiovascular: Normal rate, regular rhythm, normal heart sounds, intact distal pulses and normal pulses.  Exam reveals no gallop and no friction rub.   Pulmonary/Chest: Breath sounds normal. No respiratory distress.  Abdominal: Soft. Bowel sounds are normal. There is no tenderness. There is no guarding.  Musculoskeletal: Normal range of motion.  Lymphadenopathy:       Head (right side): No submandibular adenopathy present.       Head (left side): No submandibular adenopathy present.    He has no cervical adenopathy.  Neurological: He is alert and oriented to person, place, and time. He has normal strength. No cranial nerve deficit or sensory deficit.  Skin: Skin is warm and dry.  Psychiatric: He has a normal mood and affect. His speech is normal.  Nursing note and vitals reviewed.    ED Treatments / Results  Labs (all labs ordered are listed, but only abnormal results are displayed) Labs Reviewed - No data to display  EKG  EKG Interpretation None       Radiology No results found.  Procedures Procedures (including critical care time)  Medications Ordered in ED Medications - No data to display   Initial  Impression / Assessment and Plan / ED Course  I have reviewed the triage vital signs and the nursing notes.  Pertinent labs & imaging results that were available during my care of the patient were reviewed by me and considered in my medical decision making (see chart for details).       Final Clinical Impressions(s) / ED Diagnoses MDM Vital signs reviewed. Patient has extensive dental caries, with the left side being more severe than the right. There is no swelling under the tongue. There is no evidence of Ludwig's angina. Patient will be treated with clindamycin 3 times daily. He has an appointment with the dentist on September 19. He will use ibuprofen every 6 hours, he will use Tylenol in between the ibuprofen doses if needed. Patient acknowledges understanding of the discharge instructions and is in agreement.    Final diagnoses:  Pain, dental    New Prescriptions New Prescriptions  No medications on file     Lily Kocher, Hershal Coria 06/15/17 [place], [place], DO 06/16/17 2058

## 2017-06-15 NOTE — ED Triage Notes (Signed)
Pt c/o pain to a tooth on the left lower part of his jaw.

## 2017-08-22 ENCOUNTER — Emergency Department (HOSPITAL_COMMUNITY)
Admission: EM | Admit: 2017-08-22 | Discharge: 2017-08-23 | Disposition: A | Discharge status: Home / Self Care | Payer: Medicaid Other | Attending: Emergency Medicine | Admitting: Emergency Medicine

## 2017-08-22 ENCOUNTER — Other Ambulatory Visit: Payer: Self-pay

## 2017-08-22 ENCOUNTER — Encounter (HOSPITAL_COMMUNITY): Payer: Self-pay | Admitting: *Deleted

## 2017-08-22 DIAGNOSIS — I2510 Atherosclerotic heart disease of native coronary artery without angina pectoris: Secondary | ICD-10-CM | POA: Diagnosis not present

## 2017-08-22 DIAGNOSIS — Z79899 Other long term (current) drug therapy: Secondary | ICD-10-CM | POA: Insufficient documentation

## 2017-08-22 DIAGNOSIS — I10 Essential (primary) hypertension: Secondary | ICD-10-CM | POA: Diagnosis not present

## 2017-08-22 DIAGNOSIS — F17210 Nicotine dependence, cigarettes, uncomplicated: Secondary | ICD-10-CM | POA: Diagnosis not present

## 2017-08-22 DIAGNOSIS — Z7982 Long term (current) use of aspirin: Secondary | ICD-10-CM | POA: Diagnosis not present

## 2017-08-22 DIAGNOSIS — L0291 Cutaneous abscess, unspecified: Secondary | ICD-10-CM

## 2017-08-22 DIAGNOSIS — J45909 Unspecified asthma, uncomplicated: Secondary | ICD-10-CM | POA: Diagnosis not present

## 2017-08-22 DIAGNOSIS — L02413 Cutaneous abscess of right upper limb: Secondary | ICD-10-CM | POA: Diagnosis not present

## 2017-08-22 DIAGNOSIS — R600 Localized edema: Secondary | ICD-10-CM | POA: Diagnosis present

## 2017-08-22 MED ORDER — LIDOCAINE-EPINEPHRINE (PF) 2 %-1:200000 IJ SOLN
10.0000 mL | Freq: Once | INTRAMUSCULAR | Status: AC
  Administered 2017-08-23: 10 mL via INTRADERMAL
  Filled 2017-08-22: qty 20

## 2017-08-22 NOTE — ED Triage Notes (Signed)
Pt c/o red raised area to right shoulder that came up x 2 days ago; no drainage noted

## 2017-08-22 NOTE — ED Provider Notes (Signed)
[facility] EMERGENCY DEPARTMENT Provider Note   CSN: [id number] Arrival date & time: 08/22/17  2315     History   Chief Complaint Chief Complaint  Patient presents with  . Abscess    HPI Nathan Waters is a 36 y.o. male.  HPI   Nathan Waters is a 36 y.o. male who presents to the Emergency Department complaining of redness and pain to top of the right shoulder for 2 days.  Noticed a small, red "bump" at onset with increasing redness.  Pain worse with palpation of the area, movement of the neck and right arm.  Notes that he has seen several spiders in his home recently, but denies known bite.  He has not tried any therapies prior to arrival.  Denies fever, pain to the arm, numbness, neck stiffness and drainage.   Past Medical History:  Diagnosis Date  . Asthma   . Coronary atherosclerosis of native coronary artery    BMS to mid circumflex 11/2011, LVEF 55-60%  . Essential hypertension, benign   . Migraine   . Mixed hyperlipidemia   . STEMI (ST elevation myocardial infarction) ([place])    Complicated by VF arrest 11/2011    Patient Active Problem List   Diagnosis Date Noted  . Tobacco use 04/09/2012  . Coronary atherosclerosis of native coronary artery 01/06/2012  . Mixed hyperlipidemia 01/06/2012  . Asthma 11/21/2011    Past Surgical History:  Procedure Laterality Date  . BACK SURGERY    . SPLENECTOMY         Home Medications    Prior to Admission medications   Medication Sig Start Date End Date Taking? Authorizing Provider  albuterol (PROVENTIL HFA;VENTOLIN HFA) 108 (90 Base) MCG/ACT inhaler Inhale every 6 (six) hours as needed into the lungs for wheezing or shortness of breath.   Yes [provider]  nitroGLYCERIN (NITROSTAT) 0.4 MG SL tablet Place 1 tablet (0.4 mg total) under the tongue every 5 (five) minutes as needed for chest pain. 04/27/17  Yes Satira Sark, MD  aspirin EC 81 MG tablet Take 81 mg by mouth daily.    [provider]  clindamycin (CLEOCIN) 300 MG capsule 1 po tid with food 06/15/17   Lily Kocher, PA-C  pravastatin (PRAVACHOL) 20 MG tablet Take 1 tablet (20 mg total) by mouth every evening. 05/05/17 08/03/17  Satira Sark, MD    Family History Family History  Problem Relation Age of Onset  . Malignant hyperthermia Mother   . Malignant hyperthermia Brother   . Heart disease Brother   . Heart disease Father     Social History Social History   Tobacco Use  . Smoking status: Current Every Day Smoker    Packs/day: 1.00    Types: Cigarettes  . Smokeless tobacco: Never Used  Substance Use Topics  . Alcohol use: Yes    Alcohol/week: 0.0 oz    Comment: Occasionally  . Drug use: No     Allergies   Penicillins   Review of Systems Review of Systems  Constitutional: Negative for chills and fever.  Gastrointestinal: Negative for nausea and vomiting.  Musculoskeletal: Negative for arthralgias and joint swelling.  Skin: Positive for color change.       Area of redness and pain to right shoulder.  Hematological: Negative for adenopathy.  All other systems reviewed and are negative.    Physical Exam Updated Vital Signs BP 138/83 (BP Location: Left Arm)   Pulse (!) 111   Temp 97.9  F (36.6 C) (Oral)   Resp 18   Ht 5\' 11"  (1.803 m)   Wt 108.9 kg (240 lb)   SpO2 95%   BMI 33.47 kg/m   Physical Exam  Constitutional: He is oriented to person, place, and time. He appears well-developed and well-nourished. No distress.  HENT:  Head: Normocephalic and atraumatic.  Cardiovascular: Normal rate, regular rhythm and intact distal pulses.  Pulmonary/Chest: Effort normal and breath sounds normal. No respiratory distress.  Musculoskeletal: Normal range of motion.  Neurological: He is alert and oriented to person, place, and time. No sensory deficit. He exhibits normal muscle tone.  Skin: Skin is warm and dry. Capillary refill takes less than 2 seconds. There is erythema.  Focal 5 cm  area of induration and erythema of the anterior right shoulder.  No fluctuance or drainage.    Nursing note and vitals reviewed.    ED Treatments / Results  Labs (all labs ordered are listed, but only abnormal results are displayed) Labs Reviewed - No data to display  EKG  EKG Interpretation None       Radiology No results found.  Procedures Procedures (including critical care time)  INCISION AND DRAINAGE Performed by: Hale Bogus. Consent: Verbal consent obtained. Risks and benefits: risks, benefits and alternatives were discussed Type: abscess  Body area: right shoulder  Anesthesia: local infiltration  Local anesthetic: lidocaine 2 % with epinephrine  Anesthetic total: 3 ml  Needle aspiration using 18 g needle w/o aspirate  Complexity: simple  Drainage: none  Drainage amount: none  Packing material: none  Patient tolerance: Patient tolerated the procedure well with no immediate complications.     Medications Ordered in ED Medications - No data to display   Initial Impression / Assessment and Plan / ED Course  I have reviewed the triage vital signs and the nursing notes.  Pertinent labs & imaging results that were available during my care of the patient were reviewed by me and considered in my medical decision making (see chart for details).     Pt with early developing abscess vs possible insect bite.  No purulent material from needle aspiration.  Leading edge of erythema marked by me.  I will start abx, pt agrees to warm compresses and return precautions discussed.     Final Clinical Impressions(s) / ED Diagnoses   Final diagnoses:  Abscess    ED Discharge Orders    None       Kem Parkinson, PA-C 97/67/34 1937    Delora Fuel, MD 90/24/09 [phone]

## 2017-08-23 MED ORDER — SULFAMETHOXAZOLE-TRIMETHOPRIM 800-160 MG PO TABS
1.0000 | ORAL_TABLET | Freq: Once | ORAL | Status: AC
  Administered 2017-08-23: 1 via ORAL
  Filled 2017-08-23: qty 1

## 2017-08-23 MED ORDER — SULFAMETHOXAZOLE-TRIMETHOPRIM 800-160 MG PO TABS: 1.0000 | ORAL_TABLET | Freq: Two times a day (BID) | ORAL | 0 refills | Status: AC

## 2017-08-23 MED ORDER — OXYCODONE-ACETAMINOPHEN 5-325 MG PO TABS
1.0000 | ORAL_TABLET | Freq: Once | ORAL | Status: AC
  Administered 2017-08-23: 1 via ORAL
  Filled 2017-08-23: qty 1

## 2017-08-23 MED ORDER — HYDROCODONE-ACETAMINOPHEN 5-325 MG PO TABS: 1.0000 | ORAL_TABLET | ORAL | 0 refills | Status: DC | PRN

## 2017-08-23 NOTE — Discharge Instructions (Signed)
Warm wet compresses 2-3 times a day.  Take the antibiotic as directed until its finished.  Return here in 2-3 days for any worsening symptoms

## 2017-08-25 ENCOUNTER — Encounter (HOSPITAL_COMMUNITY): Payer: Self-pay

## 2017-08-25 ENCOUNTER — Emergency Department (HOSPITAL_COMMUNITY)
Admission: EM | Admit: 2017-08-25 | Discharge: 2017-08-25 | Disposition: A | Discharge status: Home / Self Care | Payer: Medicaid Other | Attending: Emergency Medicine | Admitting: Emergency Medicine

## 2017-08-25 DIAGNOSIS — I10 Essential (primary) hypertension: Secondary | ICD-10-CM | POA: Diagnosis not present

## 2017-08-25 DIAGNOSIS — I2510 Atherosclerotic heart disease of native coronary artery without angina pectoris: Secondary | ICD-10-CM | POA: Insufficient documentation

## 2017-08-25 DIAGNOSIS — Z5189 Encounter for other specified aftercare: Secondary | ICD-10-CM | POA: Diagnosis not present

## 2017-08-25 DIAGNOSIS — Z7982 Long term (current) use of aspirin: Secondary | ICD-10-CM | POA: Diagnosis not present

## 2017-08-25 DIAGNOSIS — Z79899 Other long term (current) drug therapy: Secondary | ICD-10-CM | POA: Diagnosis not present

## 2017-08-25 DIAGNOSIS — L03221 Cellulitis of neck: Secondary | ICD-10-CM | POA: Diagnosis not present

## 2017-08-25 DIAGNOSIS — M542 Cervicalgia: Secondary | ICD-10-CM | POA: Diagnosis present

## 2017-08-25 DIAGNOSIS — J45909 Unspecified asthma, uncomplicated: Secondary | ICD-10-CM | POA: Diagnosis not present

## 2017-08-25 DIAGNOSIS — I252 Old myocardial infarction: Secondary | ICD-10-CM | POA: Diagnosis not present

## 2017-08-25 DIAGNOSIS — F17210 Nicotine dependence, cigarettes, uncomplicated: Secondary | ICD-10-CM | POA: Insufficient documentation

## 2017-08-25 LAB — BASIC METABOLIC PANEL
ANION GAP: 9 (ref 5–15)
BUN: 11 mg/dL (ref 6–20)
CALCIUM: 9.4 mg/dL (ref 8.9–10.3)
CO2: 24 mmol/L (ref 22–32)
CREATININE: 0.97 mg/dL (ref 0.61–1.24)
Chloride: 103 mmol/L (ref 101–111)
GFR calc Af Amer: >60 mL/min (ref >60)
GLUCOSE: 109 mg/dL — AB (ref 65–99)
Potassium: 4.2 mmol/L (ref 3.5–5.1)
Sodium: 136 mmol/L (ref 135–145)

## 2017-08-25 LAB — CBC WITH DIFFERENTIAL/PLATELET
BASOS ABS: 0 10*3/uL (ref 0.0–0.1)
Basophils Relative: 0 %
EOS PCT: 2 %
Eosinophils Absolute: 0.3 10*3/uL (ref 0.0–0.7)
HEMATOCRIT: 47 % (ref 39.0–52.0)
Hemoglobin: 16.2 g/dL (ref 13.0–17.0)
LYMPHS PCT: 28 %
Lymphs Abs: 4.6 10*3/uL — ABNORMAL HIGH (ref 0.7–4.0)
MCH: 33.1 pg (ref 26.0–34.0)
MCHC: 34.5 g/dL (ref 30.0–36.0)
MCV: 95.9 fL (ref 78.0–100.0)
MONO ABS: 1.8 10*3/uL — AB (ref 0.1–1.0)
MONOS PCT: 11 %
Neutro Abs: 10.1 10*3/uL — ABNORMAL HIGH (ref 1.7–7.7)
Neutrophils Relative %: 59 %
PLATELETS: 317 10*3/uL (ref 150–400)
RBC: 4.9 MIL/uL (ref 4.22–5.81)
RDW: 14.2 % (ref 11.5–15.5)
WBC: 16.8 10*3/uL — ABNORMAL HIGH (ref 4.0–10.5)

## 2017-08-25 MED ORDER — VANCOMYCIN HCL IN DEXTROSE 1-5 GM/200ML-% IV SOLN
1000.0000 mg | Freq: Once | INTRAVENOUS | Status: AC
  Administered 2017-08-25: 1000 mg via INTRAVENOUS
  Filled 2017-08-25: qty 200

## 2017-08-25 MED ORDER — ONDANSETRON HCL 4 MG/2ML IJ SOLN
4.0000 mg | Freq: Once | INTRAMUSCULAR | Status: AC
  Administered 2017-08-25: 4 mg via INTRAVENOUS
  Filled 2017-08-25: qty 2

## 2017-08-25 MED ORDER — SODIUM CHLORIDE 0.9 % IV SOLN
INTRAVENOUS | Status: DC
  Administered 2017-08-25: 20:00:00 via INTRAVENOUS

## 2017-08-25 MED ORDER — NAPROXEN 500 MG PO TABS: 500.0000 mg | ORAL_TABLET | Freq: Two times a day (BID) | ORAL | 0 refills | Status: DC

## 2017-08-25 MED ORDER — HYDROMORPHONE HCL 1 MG/ML IJ SOLN
1.0000 mg | Freq: Once | INTRAMUSCULAR | Status: AC
  Administered 2017-08-25: 1 mg via INTRAVENOUS
  Filled 2017-08-25: qty 1

## 2017-08-25 MED ORDER — HYDROCODONE-ACETAMINOPHEN 5-325 MG PO TABS: 1.0000 | ORAL_TABLET | Freq: Four times a day (QID) | ORAL | 0 refills | Status: DC | PRN

## 2017-08-25 MED ORDER — DOXYCYCLINE HYCLATE 100 MG PO CAPS: 100.0000 mg | ORAL_CAPSULE | Freq: Two times a day (BID) | ORAL | 0 refills | Status: DC

## 2017-08-25 NOTE — ED Triage Notes (Signed)
Abscess noted to right shoulder x3-4 days. States pain is now into right side of neck.

## 2017-08-25 NOTE — ED Notes (Signed)
Instructed pt to take all of antibiotics as prescribed.Pt verbalized understanding of no driving and to use caution within 4 hours of taking pain meds due to meds cause drowsiness 

## 2017-08-25 NOTE — ED Provider Notes (Signed)
[facility] EMERGENCY DEPARTMENT Provider Note   CSN: [id number] Arrival date & time: 08/25/17  1647     History   Chief Complaint Chief Complaint  Patient presents with  . Wound Check    HPI Nathan Waters is a 36 y.o. male.  Patient seen on November 6.  For what was felt to be an abscess.  It was numbed and needled no abscess cavity.  Patient treated with the antibiotic Septra.  Patient states is not improving.  Pain is increasing.  The redness of the cellulitis was marked with a skin marker.  It has not spread beyond that.  Patient states he has been taking the antibiotic.      Past Medical History:  Diagnosis Date  . Asthma   . Coronary atherosclerosis of native coronary artery    BMS to mid circumflex 11/2011, LVEF 55-60%  . Essential hypertension, benign   . Migraine   . Mixed hyperlipidemia   . STEMI (ST elevation myocardial infarction) ([place])    Complicated by VF arrest 11/2011    Patient Active Problem List   Diagnosis Date Noted  . Tobacco use 04/09/2012  . Coronary atherosclerosis of native coronary artery 01/06/2012  . Mixed hyperlipidemia 01/06/2012  . Asthma 11/21/2011    Past Surgical History:  Procedure Laterality Date  . BACK SURGERY    . SPLENECTOMY         Home Medications    Prior to Admission medications   Medication Sig Start Date End Date Taking? Authorizing Provider  albuterol (PROVENTIL HFA;VENTOLIN HFA) 108 (90 Base) MCG/ACT inhaler Inhale every 6 (six) hours as needed into the lungs for wheezing or shortness of breath.    [provider]  aspirin EC 81 MG tablet Take 81 mg by mouth daily.    [provider]  clindamycin (CLEOCIN) 300 MG capsule 1 po tid with food 06/15/17   Lily Kocher, PA-C  doxycycline (VIBRAMYCIN) 100 MG capsule Take 1 capsule (100 mg total) 2 (two) times daily by mouth. 08/25/17   Fredia Sorrow, MD  HYDROcodone-acetaminophen (NORCO/VICODIN) 5-325 MG tablet Take 1 tablet every 4 (four)  hours as needed by mouth. 08/23/17   Triplett, Tammy, PA-C  HYDROcodone-acetaminophen (NORCO/VICODIN) 5-325 MG tablet Take 1-2 tablets every 6 (six) hours as needed by mouth. 08/25/17   Fredia Sorrow, MD  nitroGLYCERIN (NITROSTAT) 0.4 MG SL tablet Place 1 tablet (0.4 mg total) under the tongue every 5 (five) minutes as needed for chest pain. 04/27/17   Satira Sark, MD  pravastatin (PRAVACHOL) 20 MG tablet Take 1 tablet (20 mg total) by mouth every evening. 05/05/17 08/03/17  Satira Sark, MD  sulfamethoxazole-trimethoprim (BACTRIM DS,SEPTRA DS) 800-160 MG tablet Take 1 tablet 2 (two) times daily for 10 days by mouth. 08/23/17 09/02/17  Kem Parkinson, PA-C    Family History Family History  Problem Relation Age of Onset  . Malignant hyperthermia Mother   . Malignant hyperthermia Brother   . Heart disease Brother   . Heart disease Father     Social History Social History   Tobacco Use  . Smoking status: Current Every Day Smoker    Packs/day: 1.00    Types: Cigarettes  . Smokeless tobacco: Never Used  Substance Use Topics  . Alcohol use: Yes    Alcohol/week: 0.0 oz    Comment: Occasionally  . Drug use: No     Allergies   Penicillins   Review of Systems Review of Systems  Constitutional: Negative for fever.  HENT: Negative for congestion.   Eyes: Negative for redness.  Respiratory: Negative for shortness of breath.   Cardiovascular: Negative for chest pain.  Gastrointestinal: Negative for abdominal pain, nausea and vomiting.  Genitourinary: Negative for dysuria.  Musculoskeletal: Positive for neck pain. Negative for neck stiffness.  Skin: Negative for rash.  Neurological: Negative for headaches.  Hematological: Does not bruise/bleed easily.  Psychiatric/Behavioral: Negative for confusion.     Physical Exam Updated Vital Signs BP 117/66 (BP Location: Right Arm)   Pulse 94   Temp 98.2 F (36.8 C) (Oral)   Resp 18   Ht 1.803 m (5\' 11" )   Wt 108.9 kg  (240 lb)   SpO2 97%   BMI 33.47 kg/m   Physical Exam  Constitutional: He is oriented to person, place, and time. He appears well-developed and well-nourished. No distress.  HENT:  Head: Normocephalic and atraumatic.  Mouth/Throat: Oropharynx is clear and moist.  Eyes: Conjunctivae and EOM are normal. Pupils are equal, round, and reactive to light.  Neck: Normal range of motion. Neck supple.  Cardiovascular: Normal rate, regular rhythm and normal heart sounds.  Pulmonary/Chest: Effort normal and breath sounds normal.  Abdominal: Soft. Bowel sounds are normal.  Musculoskeletal: Normal range of motion.  Neurological: He is alert and oriented to person, place, and time. No cranial nerve deficit or sensory deficit. He exhibits normal muscle tone. Coordination normal.  Skin: Skin is warm. There is erythema.  Right upper shoulder base of neck area of erythema induration measuring about 5 cm with several small pustules.  But no evidence of any fluctuance or abscess cavity.  The erythema is no wider than the skin markings placed on November 6.  Nursing note and vitals reviewed.    ED Treatments / Results  Labs (all labs ordered are listed, but only abnormal results are displayed) Labs Reviewed  CBC WITH DIFFERENTIAL/PLATELET - Abnormal; Notable for the following components:      Result Value   WBC 16.8 (*)    Neutro Abs 10.1 (*)    Lymphs Abs 4.6 (*)    Monocytes Absolute 1.8 (*)    All other components within normal limits  BASIC METABOLIC PANEL - Abnormal; Notable for the following components:   Glucose, Bld 109 (*)    All other components within normal limits    EKG  EKG Interpretation None       Radiology No results found.  Procedures Procedures (including critical care time)  Medications Ordered in ED Medications  0.9 %  sodium chloride infusion ( Intravenous New Bag/Given 08/25/17 1942)  ondansetron (ZOFRAN) injection 4 mg (4 mg Intravenous Given 08/25/17 1942)    HYDROmorphone (DILAUDID) injection 1 mg (1 mg Intravenous Given 08/25/17 1942)  vancomycin (VANCOCIN) IVPB 1000 mg/200 mL premix (0 mg Intravenous Stopped 08/25/17 2109)  HYDROmorphone (DILAUDID) injection 1 mg (1 mg Intravenous Given 08/25/17 2109)     Initial Impression / Assessment and Plan / ED Course  I have reviewed the triage vital signs and the nursing notes.  Pertinent labs & imaging results that were available during my care of the patient were reviewed by me and considered in my medical decision making (see chart for details).    Patient seen by 1 of our physician assistants on November 6.  For an area of concerning abscess development right shoulder near the base of the neck.  That area was numbed annealed but no pus.  Patient started on the antibiotic Septra.  Margins of the wound were  marked with a skin marker.  Patient now back stating that the area is more painful and has some pustules on top of it.  But the redness has not spread much.  On exam here today.  Does have an area of induration and erythema measuring about 4 5 cm.  There are some superficial pustules on top but no evidence of any deep fluctuance.  Patient had labs done which shows a leukocytosis.  Patient given single dose of IV vancomycin.  Will be continued and switched over to doxycycline.  Patient also given medication for pain control.  No evidence of any abscess at this time to I&D.  Patient will continue the antibiotics he will return for any new or worse symptoms or if not improving over the next 2 days.  Patient stable for discharge home.   Final Clinical Impressions(s) / ED Diagnoses   Final diagnoses:  Abscess re-check  Cellulitis of neck    ED Discharge Orders        Ordered    doxycycline (VIBRAMYCIN) 100 MG capsule  2 times daily     08/25/17 2113    HYDROcodone-acetaminophen (NORCO/VICODIN) 5-325 MG tablet  Every 6 hours PRN     08/25/17 2113       Fredia Sorrow, MD 08/25/17 2125

## 2017-08-25 NOTE — ED Notes (Signed)
Area to right shoulder per pt has gotten worse, area red and very firm to touch, warm and tender to touch as well.  Pt denies drainage from site. Pt denies fevers.

## 2017-08-25 NOTE — Discharge Instructions (Signed)
Change over to the new antibiotic doxycycline.  Take pain medication as directed.  Would expect improvement over the next 2 days.  Return for any new or worse symptoms.  Or if not improving.

## 2018-01-04 ENCOUNTER — Encounter (HOSPITAL_COMMUNITY): Payer: Self-pay | Admitting: *Deleted

## 2018-01-04 ENCOUNTER — Emergency Department (HOSPITAL_COMMUNITY)
Admission: EM | Admit: 2018-01-04 | Discharge: 2018-01-04 | Disposition: A | Discharge status: Home / Self Care | Payer: Medicaid Other | Attending: Emergency Medicine | Admitting: Emergency Medicine

## 2018-01-04 ENCOUNTER — Other Ambulatory Visit: Payer: Self-pay

## 2018-01-04 DIAGNOSIS — I2510 Atherosclerotic heart disease of native coronary artery without angina pectoris: Secondary | ICD-10-CM | POA: Diagnosis not present

## 2018-01-04 DIAGNOSIS — I10 Essential (primary) hypertension: Secondary | ICD-10-CM | POA: Insufficient documentation

## 2018-01-04 DIAGNOSIS — J45909 Unspecified asthma, uncomplicated: Secondary | ICD-10-CM | POA: Insufficient documentation

## 2018-01-04 DIAGNOSIS — Z79899 Other long term (current) drug therapy: Secondary | ICD-10-CM | POA: Insufficient documentation

## 2018-01-04 DIAGNOSIS — F17210 Nicotine dependence, cigarettes, uncomplicated: Secondary | ICD-10-CM | POA: Diagnosis not present

## 2018-01-04 DIAGNOSIS — Z7982 Long term (current) use of aspirin: Secondary | ICD-10-CM | POA: Insufficient documentation

## 2018-01-04 DIAGNOSIS — H109 Unspecified conjunctivitis: Secondary | ICD-10-CM | POA: Diagnosis present

## 2018-01-04 MED ORDER — TOBRAMYCIN 0.3 % OP SOLN
2.0000 [drp] | Freq: Once | OPHTHALMIC | Status: AC
  Administered 2018-01-04: 2 [drp] via OPHTHALMIC
  Filled 2018-01-04: qty 5

## 2018-01-04 NOTE — ED Provider Notes (Signed)
[facility] EMERGENCY DEPARTMENT Provider Note   CSN: [id number] Arrival date & time: 01/04/18  1913     History   Chief Complaint Chief Complaint  Patient presents with  . Eye Problem    HPI Nathan Waters is a 37 y.o. male.  HPI   Nathan Waters is a 37 y.o. male who presents to the Emergency Department complaining of redness and yellow drainage from the left eye.  Symptoms began this morning.  He also notes later today that he noticed swelling of the left upper eyelid.  He does admit to rubbing his eyes today.  Denies known foreign bodies.  Does admit to sneezing and rhinorrhea secondary to allergies.  Takes Benadryl intermittently.  Also denies visual changes.  He also denies headache, dizziness, neck pain, vomiting or fever.  Does not wear corrective lenses.  No contact use.   Past Medical History:  Diagnosis Date  . Asthma   . Coronary atherosclerosis of native coronary artery    BMS to mid circumflex 11/2011, LVEF 55-60%  . Essential hypertension, benign   . Migraine   . Mixed hyperlipidemia   . STEMI (ST elevation myocardial infarction) ([place])    Complicated by VF arrest 11/2011    Patient Active Problem List   Diagnosis Date Noted  . Tobacco use 04/09/2012  . Coronary atherosclerosis of native coronary artery 01/06/2012  . Mixed hyperlipidemia 01/06/2012  . Asthma 11/21/2011    Past Surgical History:  Procedure Laterality Date  . BACK SURGERY    . LEFT HEART CATHETERIZATION WITH CORONARY ANGIOGRAM N/A 11/21/2011   Procedure: LEFT HEART CATHETERIZATION WITH CORONARY ANGIOGRAM;  Surgeon: Peter M Martinique, MD;  Location: [facility] CATH LAB;  Service: Cardiovascular;  Laterality: N/A;  . PERCUTANEOUS CORONARY STENT INTERVENTION (PCI-S) N/A 11/21/2011   Procedure: PERCUTANEOUS CORONARY STENT INTERVENTION (PCI-S);  Surgeon: Peter M Martinique, MD;  Location: [facility] CATH LAB;  Service: Cardiovascular;  Laterality: N/A;  . SPLENECTOMY         Home Medications    Prior to  Admission medications   Medication Sig Start Date End Date Taking? Authorizing Provider  albuterol (PROVENTIL HFA;VENTOLIN HFA) 108 (90 Base) MCG/ACT inhaler Inhale every 6 (six) hours as needed into the lungs for wheezing or shortness of breath.    [provider]  aspirin EC 81 MG tablet Take 81 mg by mouth daily.    [provider]  clindamycin (CLEOCIN) 300 MG capsule 1 po tid with food 06/15/17   Lily Kocher, PA-C  doxycycline (VIBRAMYCIN) 100 MG capsule Take 1 capsule (100 mg total) 2 (two) times daily by mouth. 08/25/17   Fredia Sorrow, MD  HYDROcodone-acetaminophen (NORCO/VICODIN) 5-325 MG tablet Take 1 tablet every 4 (four) hours as needed by mouth. 08/23/17   Lyssa Hackley, PA-C  HYDROcodone-acetaminophen (NORCO/VICODIN) 5-325 MG tablet Take 1-2 tablets every 6 (six) hours as needed by mouth. 08/25/17   Fredia Sorrow, MD  naproxen (NAPROSYN) 500 MG tablet Take 1 tablet (500 mg total) 2 (two) times daily by mouth. 08/25/17   Fredia Sorrow, MD  nitroGLYCERIN (NITROSTAT) 0.4 MG SL tablet Place 1 tablet (0.4 mg total) under the tongue every 5 (five) minutes as needed for chest pain. 04/27/17   Satira Sark, MD  pravastatin (PRAVACHOL) 20 MG tablet Take 1 tablet (20 mg total) by mouth every evening. 05/05/17 08/03/17  Satira Sark, MD    Family History Family History  Problem Relation Age of Onset  . Malignant hyperthermia Mother   .  Malignant hyperthermia Brother   . Heart disease Brother   . Heart disease Father     Social History Social History   Tobacco Use  . Smoking status: Current Every Day Smoker    Packs/day: 1.00    Types: Cigarettes  . Smokeless tobacco: Never Used  Substance Use Topics  . Alcohol use: Yes    Alcohol/week: 0.0 oz    Comment: Occasionally  . Drug use: No     Allergies   Penicillins   Review of Systems Review of Systems  Constitutional: Negative for appetite change, chills and fever.  HENT: Positive for  congestion and rhinorrhea. Negative for ear pain, sneezing and sore throat.   Eyes: Positive for discharge and redness. Negative for photophobia, itching and visual disturbance.  Musculoskeletal: Negative for myalgias, neck pain and neck stiffness.  Skin: Negative for rash.  Neurological: Negative for dizziness, syncope, facial asymmetry, weakness, numbness and headaches.     Physical Exam Updated Vital Signs BP 120/87   Pulse 63   Temp 98.4 F (36.9 C) (Oral)   Resp 16   Ht 6' (1.829 m)   Wt 108.9 kg (240 lb)   SpO2 98%   BMI 32.55 kg/m   Physical Exam  Constitutional: He is oriented to person, place, and time. He appears well-developed and well-nourished. No distress.  HENT:  Head: Atraumatic.  Mouth/Throat: Oropharynx is clear and moist.  Eyes: Pupils are equal, round, and reactive to light. EOM are normal. Lids are everted and swept, no foreign bodies found. Left eye exhibits discharge. Left eye exhibits no chemosis. No foreign body present in the left eye. Left conjunctiva is injected. Left conjunctiva has no hemorrhage.  Yellow stringy discharge present at the medial and lateral canthus of the left eye.  Mild edema of the left upper lid.  No facial edema or tenderness.  Neck: Normal range of motion. No JVD present.  Cardiovascular: Normal rate and regular rhythm.  Pulmonary/Chest: Effort normal and breath sounds normal.  Lymphadenopathy:    He has no cervical adenopathy.  Neurological: He is alert and oriented to person, place, and time. No sensory deficit.  Skin: Skin is warm. No rash noted.  Psychiatric: He has a normal mood and affect.  Nursing note and vitals reviewed.    ED Treatments / Results  Labs (all labs ordered are listed, but only abnormal results are displayed) Labs Reviewed - No data to display  EKG  EKG Interpretation None       Radiology No results found.  Procedures Procedures (including critical care time)  Medications Ordered in  ED Medications  tobramycin (TOBREX) 0.3 % ophthalmic solution 2 drop (has no administration in time range)     Initial Impression / Assessment and Plan / ED Course  I have reviewed the triage vital signs and the nursing notes.  Pertinent labs & imaging results that were available during my care of the patient were reviewed by me and considered in my medical decision making (see chart for details).        Visual Acuity  Right Eye Distance: 20/20 Left Eye Distance: 20/20 Bilateral Distance: 20/20  Right Eye Near:   Left Eye Near:    Bilateral Near:      Patient well-appearing.  Focal symptoms of the left eye appear consistent with conjunctivitis.  Visual acuity reassuring.  Tobramycin dispensed, patient agrees to warm compresses and close follow-up.  Return precautions discussed.  Final Clinical Impressions(s) / ED Diagnoses   Final diagnoses:  Conjunctivitis of left eye, unspecified conjunctivitis type    ED Discharge Orders    None       Kem Parkinson, Hershal Coria 01/04/18 2034    Sherwood Gambler, MD 01/05/18 [phone]

## 2018-01-04 NOTE — Discharge Instructions (Signed)
Frequent warm wet compresses to your left eye.  Apply 1-2 drops of the tobramycin every 4 hours for 5-7 days.  Return to the ER for any worsening symptoms.  You may also take over-the-counter Benadryl if needed.

## 2018-01-04 NOTE — ED Triage Notes (Signed)
Pt c/o redness, drainage from left eye that started this am, denies any injury.

## 2018-02-04 ENCOUNTER — Encounter (HOSPITAL_COMMUNITY): Payer: Self-pay | Admitting: Emergency Medicine

## 2018-02-04 ENCOUNTER — Other Ambulatory Visit: Payer: Self-pay

## 2018-02-04 ENCOUNTER — Emergency Department (HOSPITAL_COMMUNITY)
Admission: EM | Admit: 2018-02-04 | Discharge: 2018-02-04 | Disposition: A | Discharge status: Home / Self Care | Payer: Medicaid Other | Attending: Emergency Medicine | Admitting: Emergency Medicine

## 2018-02-04 ENCOUNTER — Emergency Department (HOSPITAL_COMMUNITY): Payer: Medicaid Other

## 2018-02-04 DIAGNOSIS — F17210 Nicotine dependence, cigarettes, uncomplicated: Secondary | ICD-10-CM | POA: Insufficient documentation

## 2018-02-04 DIAGNOSIS — Z7982 Long term (current) use of aspirin: Secondary | ICD-10-CM | POA: Insufficient documentation

## 2018-02-04 DIAGNOSIS — I10 Essential (primary) hypertension: Secondary | ICD-10-CM | POA: Diagnosis not present

## 2018-02-04 DIAGNOSIS — M79645 Pain in left finger(s): Secondary | ICD-10-CM

## 2018-02-04 DIAGNOSIS — J45909 Unspecified asthma, uncomplicated: Secondary | ICD-10-CM | POA: Insufficient documentation

## 2018-02-04 DIAGNOSIS — I252 Old myocardial infarction: Secondary | ICD-10-CM | POA: Insufficient documentation

## 2018-02-04 DIAGNOSIS — I2510 Atherosclerotic heart disease of native coronary artery without angina pectoris: Secondary | ICD-10-CM | POA: Insufficient documentation

## 2018-02-04 MED ORDER — NAPROXEN 500 MG PO TABS: 500.0000 mg | ORAL_TABLET | Freq: Two times a day (BID) | ORAL | 0 refills | Status: DC | PRN

## 2018-02-04 MED ORDER — NAPROXEN 250 MG PO TABS
500.0000 mg | ORAL_TABLET | Freq: Once | ORAL | Status: AC
  Administered 2018-02-04: 500 mg via ORAL
  Filled 2018-02-04: qty 2

## 2018-02-04 MED ORDER — NAPROXEN 500 MG PO TABS: 500.0000 mg | ORAL_TABLET | Freq: Two times a day (BID) | ORAL | 0 refills | Status: DC

## 2018-02-04 NOTE — ED Notes (Signed)
Patient transported to X-ray 

## 2018-02-04 NOTE — ED Provider Notes (Signed)
[facility] EMERGENCY DEPARTMENT Provider Note   CSN: [id number] Arrival date & time: 02/04/18  0308     History   Chief Complaint Chief Complaint  Patient presents with  . Thumb pain    HPI Nathan Waters is a 37 y.o. male.  Patient presents with 3 days of left thumb pain.  He has not taken anything for it.  Denies any specific injury but states he has been helping a friend roll newspapers may have hurt it that way.  Pain is on the palmar side of of his proximal phalanx.  Denies any bleeding.  Denies any weakness, numbness or tingling. Hx STEMI and VF arrest 2013.  Takes aspirin daily.  No other cardiac medications.  Denies any recent chest pain or shortness of breath.  The history is provided by the patient.    Past Medical History:  Diagnosis Date  . Asthma   . Coronary atherosclerosis of native coronary artery    BMS to mid circumflex 11/2011, LVEF 55-60%  . Essential hypertension, benign   . Migraine   . Mixed hyperlipidemia   . STEMI (ST elevation myocardial infarction) ([place])    Complicated by VF arrest 11/2011    Patient Active Problem List   Diagnosis Date Noted  . Tobacco use 04/09/2012  . Coronary atherosclerosis of native coronary artery 01/06/2012  . Mixed hyperlipidemia 01/06/2012  . Asthma 11/21/2011    Past Surgical History:  Procedure Laterality Date  . BACK SURGERY    . LEFT HEART CATHETERIZATION WITH CORONARY ANGIOGRAM N/A 11/21/2011   Procedure: LEFT HEART CATHETERIZATION WITH CORONARY ANGIOGRAM;  Surgeon: Peter M Martinique, MD;  Location: [facility] CATH LAB;  Service: Cardiovascular;  Laterality: N/A;  . PERCUTANEOUS CORONARY STENT INTERVENTION (PCI-S) N/A 11/21/2011   Procedure: PERCUTANEOUS CORONARY STENT INTERVENTION (PCI-S);  Surgeon: Peter M Martinique, MD;  Location: [facility] CATH LAB;  Service: Cardiovascular;  Laterality: N/A;  . SPLENECTOMY          Home Medications    Prior to Admission medications   Medication Sig Start Date End Date Taking?  Authorizing Provider  albuterol (PROVENTIL HFA;VENTOLIN HFA) 108 (90 Base) MCG/ACT inhaler Inhale every 6 (six) hours as needed into the lungs for wheezing or shortness of breath.    [provider]  aspirin EC 81 MG tablet Take 81 mg by mouth daily.    [provider]  clindamycin (CLEOCIN) 300 MG capsule 1 po tid with food 06/15/17   Lily Kocher, PA-C  doxycycline (VIBRAMYCIN) 100 MG capsule Take 1 capsule (100 mg total) 2 (two) times daily by mouth. 08/25/17   Fredia Sorrow, MD  HYDROcodone-acetaminophen (NORCO/VICODIN) 5-325 MG tablet Take 1 tablet every 4 (four) hours as needed by mouth. 08/23/17   Triplett, Tammy, PA-C  HYDROcodone-acetaminophen (NORCO/VICODIN) 5-325 MG tablet Take 1-2 tablets every 6 (six) hours as needed by mouth. 08/25/17   Fredia Sorrow, MD  naproxen (NAPROSYN) 500 MG tablet Take 1 tablet (500 mg total) 2 (two) times daily by mouth. 08/25/17   Fredia Sorrow, MD  nitroGLYCERIN (NITROSTAT) 0.4 MG SL tablet Place 1 tablet (0.4 mg total) under the tongue every 5 (five) minutes as needed for chest pain. 04/27/17   Satira Sark, MD  pravastatin (PRAVACHOL) 20 MG tablet Take 1 tablet (20 mg total) by mouth every evening. 05/05/17 08/03/17  Satira Sark, MD    Family History Family History  Problem Relation Age of Onset  . Malignant hyperthermia Mother   . Malignant hyperthermia Brother   .  Heart disease Brother   . Heart disease Father     Social History Social History   Tobacco Use  . Smoking status: Current Every Day Smoker    Packs/day: 1.00    Types: Cigarettes  . Smokeless tobacco: Never Used  Substance Use Topics  . Alcohol use: Yes    Alcohol/week: 0.0 oz    Comment: Occasionally  . Drug use: No     Allergies   Penicillins   Review of Systems Review of Systems  Constitutional: Negative for activity change, appetite change and fever.  HENT: Negative for congestion.   Respiratory: Negative for cough, chest  tightness and shortness of breath.   Cardiovascular: Negative for chest pain.  Gastrointestinal: Negative for abdominal pain, nausea and vomiting.  Genitourinary: Negative for dysuria and hematuria.  Musculoskeletal: Positive for arthralgias and myalgias. Negative for back pain and neck pain.  Skin: Negative for rash.  Neurological: Negative for dizziness, weakness and headaches.    all other systems are negative except as noted in the HPI and PMH.    Physical Exam Updated Vital Signs BP 135/89 (BP Location: Right Arm)   Pulse 92   Temp 98 F (36.7 C) (Oral)   Resp 16   Ht 6' (1.829 m)   Wt 108.9 kg (240 lb)   SpO2 95%   BMI 32.55 kg/m   Physical Exam  Constitutional: He is oriented to person, place, and time. He appears well-developed and well-nourished. No distress.  HENT:  Head: Normocephalic and atraumatic.  Mouth/Throat: Oropharynx is clear and moist. No oropharyngeal exudate.  Eyes: Pupils are equal, round, and reactive to light. Conjunctivae and EOM are normal.  Neck: Normal range of motion. Neck supple.  No meningismus.  Cardiovascular: Normal rate, regular rhythm, normal heart sounds and intact distal pulses.  No murmur heard. Pulmonary/Chest: Effort normal and breath sounds normal. No respiratory distress.  Abdominal: Soft. There is no tenderness. There is no rebound and no guarding.  Musculoskeletal: Normal range of motion. He exhibits tenderness. He exhibits no edema or deformity.  Left thumb appears normal to inspection.  There is no erythema.  There is tenderness to palpation on the proximal phalanx on the palmar side.  Range of motion intact to IP joint and MCP joint. Intact radial pulse. Full range of motion of wrist and other fingers.  Neurological: He is alert and oriented to person, place, and time. No cranial nerve deficit. He exhibits normal muscle tone. Coordination normal.  No ataxia on finger to nose bilaterally. No pronator drift. 5/5 strength  throughout. CN 2-12 intact.Equal grip strength. Sensation intact.   Skin: Skin is warm.  Psychiatric: He has a normal mood and affect. His behavior is normal.  Nursing note and vitals reviewed.    ED Treatments / Results  Labs (all labs ordered are listed, but only abnormal results are displayed) Labs Reviewed - No data to display  EKG None  Radiology Dg Finger Thumb Left  Result Date: 02/04/2018 CLINICAL DATA:  Left thumb swelling EXAM: LEFT THUMB 2+V COMPARISON:  None. FINDINGS: No fracture or malalignment. Soft tissue swelling volar aspect of the thumb. No radiopaque foreign body. Joint spaces are maintained. IMPRESSION: No acute osseous abnormality. Electronically Signed   By: Donavan Foil M.D.   On: 02/04/2018 03:29    Procedures Procedures (including critical care time)  Medications Ordered in ED Medications  naproxen (NAPROSYN) tablet 500 mg (has no administration in time range)     Initial Impression / Assessment and  Plan / ED Course  I have reviewed the triage vital signs and the nursing notes.  Pertinent labs & imaging results that were available during my care of the patient were reviewed by me and considered in my medical decision making (see chart for details).    Nonspecific thumb pain without injury.  He is neurovascularly intact.  X-ray obtained in triage is negative.  Advised patient to avoid repetitive motion, will splint, NSAIDs as needed, PCP follow-up.  Return precautions discussed.  Final Clinical Impressions(s) / ED Diagnoses   Final diagnoses:  Pain of left thumb    ED Discharge Orders    None       Leeman Johnsey, Annie Main, MD 02/04/18 [phone]

## 2018-02-04 NOTE — Discharge Instructions (Addendum)
Wear the splint and reduce repetitive motions. follow-up with your primary doctor.  Return to the ED if you develop new or worsening symptoms.s.

## 2018-02-04 NOTE — ED Triage Notes (Signed)
Pt states he was helping a friend roll newspapers when his left thumb started to "swell and hurt."

## 2018-02-04 NOTE — ED Notes (Signed)
Pt returned from X Ray.

## 2018-07-07 ENCOUNTER — Emergency Department (HOSPITAL_COMMUNITY)
Admission: EM | Admit: 2018-07-07 | Discharge: 2018-07-07 | Disposition: A | Discharge status: Home / Self Care | Payer: Medicaid Other | Attending: Emergency Medicine | Admitting: Emergency Medicine

## 2018-07-07 ENCOUNTER — Emergency Department (HOSPITAL_COMMUNITY): Payer: Medicaid Other

## 2018-07-07 ENCOUNTER — Encounter (HOSPITAL_COMMUNITY): Payer: Self-pay | Admitting: Emergency Medicine

## 2018-07-07 ENCOUNTER — Other Ambulatory Visit: Payer: Self-pay

## 2018-07-07 DIAGNOSIS — I252 Old myocardial infarction: Secondary | ICD-10-CM | POA: Insufficient documentation

## 2018-07-07 DIAGNOSIS — F17210 Nicotine dependence, cigarettes, uncomplicated: Secondary | ICD-10-CM | POA: Insufficient documentation

## 2018-07-07 DIAGNOSIS — J45909 Unspecified asthma, uncomplicated: Secondary | ICD-10-CM | POA: Insufficient documentation

## 2018-07-07 DIAGNOSIS — Y929 Unspecified place or not applicable: Secondary | ICD-10-CM | POA: Diagnosis not present

## 2018-07-07 DIAGNOSIS — Y999 Unspecified external cause status: Secondary | ICD-10-CM | POA: Diagnosis not present

## 2018-07-07 DIAGNOSIS — Y939 Activity, unspecified: Secondary | ICD-10-CM | POA: Insufficient documentation

## 2018-07-07 DIAGNOSIS — S8002XA Contusion of left knee, initial encounter: Secondary | ICD-10-CM | POA: Diagnosis present

## 2018-07-07 DIAGNOSIS — W19XXXA Unspecified fall, initial encounter: Secondary | ICD-10-CM | POA: Insufficient documentation

## 2018-07-07 DIAGNOSIS — Z7982 Long term (current) use of aspirin: Secondary | ICD-10-CM | POA: Diagnosis not present

## 2018-07-07 DIAGNOSIS — I10 Essential (primary) hypertension: Secondary | ICD-10-CM | POA: Insufficient documentation

## 2018-07-07 DIAGNOSIS — I2510 Atherosclerotic heart disease of native coronary artery without angina pectoris: Secondary | ICD-10-CM | POA: Diagnosis not present

## 2018-07-07 DIAGNOSIS — Z79899 Other long term (current) drug therapy: Secondary | ICD-10-CM | POA: Diagnosis not present

## 2018-07-07 NOTE — Discharge Instructions (Signed)
Return if any problems.

## 2018-07-07 NOTE — ED Triage Notes (Signed)
Pt states that he fell and land on his left knee and it is hurting him

## 2018-07-07 NOTE — ED Notes (Signed)
Patient transported to X-ray 

## 2018-07-07 NOTE — ED Provider Notes (Signed)
[facility] EMERGENCY DEPARTMENT Provider Note   CSN: [id number] Arrival date & time: 07/07/18  1352     History   Chief Complaint Chief Complaint  Patient presents with  . Fall  . Knee Pain    HPI Nathan Waters is a 37 y.o. male.  The history is provided by the patient. No language interpreter was used.  Knee Pain   This is a new problem. The current episode started 6 to 12 hours ago. The problem occurs constantly. The problem has been gradually worsening. The pain is present in the left knee. The quality of the pain is described as aching. The pain is moderate. Associated symptoms include limited range of motion. He has tried nothing for the symptoms. The treatment provided no relief. There has been a history of trauma.  Pt fell and hit his knee.    Past Medical History:  Diagnosis Date  . Asthma   . Coronary atherosclerosis of native coronary artery    BMS to mid circumflex 11/2011, LVEF 55-60%  . Essential hypertension, benign   . Migraine   . Mixed hyperlipidemia   . STEMI (ST elevation myocardial infarction) ([employer])    Complicated by VF arrest 11/2011    Patient Active Problem List   Diagnosis Date Noted  . Tobacco use 04/09/2012  . Coronary atherosclerosis of native coronary artery 01/06/2012  . Mixed hyperlipidemia 01/06/2012  . Asthma 11/21/2011    Past Surgical History:  Procedure Laterality Date  . BACK SURGERY    . LEFT HEART CATHETERIZATION WITH CORONARY ANGIOGRAM N/A 11/21/2011   Procedure: LEFT HEART CATHETERIZATION WITH CORONARY ANGIOGRAM;  Surgeon: Peter M Martinique, MD;  Location: [facility] CATH LAB;  Service: Cardiovascular;  Laterality: N/A;  . PERCUTANEOUS CORONARY STENT INTERVENTION (PCI-S) N/A 11/21/2011   Procedure: PERCUTANEOUS CORONARY STENT INTERVENTION (PCI-S);  Surgeon: Peter M Martinique, MD;  Location: [facility] CATH LAB;  Service: Cardiovascular;  Laterality: N/A;  . SPLENECTOMY          Home Medications    Prior to Admission medications   Medication  Sig Start Date End Date Taking? Authorizing Provider  albuterol (PROVENTIL HFA;VENTOLIN HFA) 108 (90 Base) MCG/ACT inhaler Inhale every 6 (six) hours as needed into the lungs for wheezing or shortness of breath.    [provider]  aspirin EC 81 MG tablet Take 81 mg by mouth daily.    [provider]  naproxen (NAPROSYN) 500 MG tablet Take 1 tablet (500 mg total) by mouth 2 (two) times daily as needed for moderate pain. 02/04/18   Rancour, Annie Main, MD  nitroGLYCERIN (NITROSTAT) 0.4 MG SL tablet Place 1 tablet (0.4 mg total) under the tongue every 5 (five) minutes as needed for chest pain. 04/27/17   Satira Sark, MD  pravastatin (PRAVACHOL) 20 MG tablet Take 1 tablet (20 mg total) by mouth every evening. 05/05/17 08/03/17  Satira Sark, MD    Family History Family History  Problem Relation Age of Onset  . Malignant hyperthermia Mother   . Malignant hyperthermia Brother   . Heart disease Brother   . Heart disease Father     Social History Social History   Tobacco Use  . Smoking status: Current Every Day Smoker    Packs/day: 1.00    Types: Cigarettes  . Smokeless tobacco: Never Used  Substance Use Topics  . Alcohol use: Yes    Comment: Occasionally  . Drug use: No     Allergies   Penicillins   Review of  Systems Review of Systems  Musculoskeletal: Positive for joint swelling.  All other systems reviewed and are negative.    Physical Exam Updated Vital Signs BP (!) 134/94 (BP Location: Right Arm)   Pulse (!) 106   Temp 98.6 F (37 C) (Temporal)   Resp 16   Ht 6' (1.829 m)   Wt 108.9 kg   SpO2 96%   BMI 32.55 kg/m   Physical Exam  Constitutional: He appears well-developed and well-nourished.  HENT:  Head: Normocephalic.  Cardiovascular: Normal rate.  Pulmonary/Chest: Effort normal.  Musculoskeletal: He exhibits tenderness. He exhibits no deformity.  Superficial abrasion knee, tender patella and prepatella  Neurological: He is  alert.  Skin: Skin is warm.  Psychiatric: He has a normal mood and affect.  Nursing note and vitals reviewed.    ED Treatments / Results  Labs (all labs ordered are listed, but only abnormal results are displayed) Labs Reviewed - No data to display  EKG None  Radiology Dg Knee Complete 4 Views Left  Result Date: 07/07/2018 CLINICAL DATA:  Fall today. Knee pain and limited weight-bearing and flexion. EXAM: LEFT KNEE - COMPLETE 4+ VIEW COMPARISON:  Radiographs 06/20/2004. FINDINGS: The mineralization and alignment are normal. There is no evidence of acute fracture or dislocation. The joint spaces are maintained. There is spurring at the patellar and tibial tubercle attachments of the patellar tendon. No significant joint effusion. Probable superficial varicosities medially. IMPRESSION: No acute osseous findings or significant arthropathic changes. Electronically Signed   By: Richardean Sale M.D.   On: 07/07/2018 14:33    Procedures Procedures (including critical care time)  Medications Ordered in ED Medications - No data to display   Initial Impression / Assessment and Plan / ED Course  I have reviewed the triage vital signs and the nursing notes.  Pertinent labs & imaging results that were available during my care of the patient were reviewed by me and considered in my medical decision making (see chart for details).     MDM  Xray reviewed and discussed with pt.  Pt placed in an ace wrap.    Final Clinical Impressions(s) / ED Diagnoses   Final diagnoses:  Contusion of left knee, initial encounter    ED Discharge Orders    None    An After Visit Summary was printed and given to the patient.    Fransico Meadow, [place] 07/07/18 1504    Noemi Chapel, MD 07/07/18 [phone]

## 2018-09-19 ENCOUNTER — Encounter (HOSPITAL_COMMUNITY): Payer: Self-pay | Admitting: *Deleted

## 2018-09-19 ENCOUNTER — Emergency Department (HOSPITAL_COMMUNITY): Payer: Medicaid Other

## 2018-09-19 ENCOUNTER — Emergency Department (HOSPITAL_COMMUNITY)
Admission: EM | Admit: 2018-09-19 | Discharge: 2018-09-19 | Discharge status: Against Medical Advice | Payer: Medicaid Other | Attending: Emergency Medicine | Admitting: Emergency Medicine

## 2018-09-19 ENCOUNTER — Other Ambulatory Visit: Payer: Self-pay

## 2018-09-19 DIAGNOSIS — F17210 Nicotine dependence, cigarettes, uncomplicated: Secondary | ICD-10-CM | POA: Insufficient documentation

## 2018-09-19 DIAGNOSIS — I10 Essential (primary) hypertension: Secondary | ICD-10-CM | POA: Diagnosis not present

## 2018-09-19 DIAGNOSIS — I2510 Atherosclerotic heart disease of native coronary artery without angina pectoris: Secondary | ICD-10-CM | POA: Insufficient documentation

## 2018-09-19 DIAGNOSIS — Z7982 Long term (current) use of aspirin: Secondary | ICD-10-CM | POA: Diagnosis not present

## 2018-09-19 DIAGNOSIS — R079 Chest pain, unspecified: Secondary | ICD-10-CM | POA: Diagnosis present

## 2018-09-19 DIAGNOSIS — J45909 Unspecified asthma, uncomplicated: Secondary | ICD-10-CM | POA: Diagnosis not present

## 2018-09-19 DIAGNOSIS — Z79899 Other long term (current) drug therapy: Secondary | ICD-10-CM | POA: Diagnosis not present

## 2018-09-19 LAB — TROPONIN I: Troponin I: <0.03 ng/mL (ref <0.03)

## 2018-09-19 LAB — CBC WITH DIFFERENTIAL/PLATELET
Abs Immature Granulocytes: 0.04 10*3/uL (ref 0.00–0.07)
Basophils Absolute: 0.1 10*3/uL (ref 0.0–0.1)
Basophils Relative: 1 %
Eosinophils Absolute: 0.4 10*3/uL (ref 0.0–0.5)
Eosinophils Relative: 3 %
HCT: 46.2 % (ref 39.0–52.0)
Hemoglobin: 15.3 g/dL (ref 13.0–17.0)
Immature Granulocytes: 0 %
Lymphocytes Relative: 22 %
Lymphs Abs: 3.2 10*3/uL (ref 0.7–4.0)
MCH: 32.3 pg (ref 26.0–34.0)
MCHC: 33.1 g/dL (ref 30.0–36.0)
MCV: 97.5 fL (ref 80.0–100.0)
Monocytes Absolute: 1.1 10*3/uL — ABNORMAL HIGH (ref 0.1–1.0)
Monocytes Relative: 7 %
Neutro Abs: 9.7 10*3/uL — ABNORMAL HIGH (ref 1.7–7.7)
Neutrophils Relative %: 67 %
Platelets: 298 10*3/uL (ref 150–400)
RBC: 4.74 MIL/uL (ref 4.22–5.81)
RDW: 14.2 % (ref 11.5–15.5)
WBC: 14.4 10*3/uL — ABNORMAL HIGH (ref 4.0–10.5)
nRBC: 0 % (ref 0.0–0.2)

## 2018-09-19 LAB — BASIC METABOLIC PANEL
Anion gap: 5 (ref 5–15)
BUN: 9 mg/dL (ref 6–20)
CO2: 23 mmol/L (ref 22–32)
Calcium: 8.8 mg/dL — ABNORMAL LOW (ref 8.9–10.3)
Chloride: 110 mmol/L (ref 98–111)
Creatinine, Ser: 0.85 mg/dL (ref 0.61–1.24)
GFR calc Af Amer: >60 mL/min (ref >60)
GFR calc non Af Amer: >60 mL/min (ref >60)
Glucose, Bld: 110 mg/dL — ABNORMAL HIGH (ref 70–99)
Potassium: 3.7 mmol/L (ref 3.5–5.1)
Sodium: 138 mmol/L (ref 135–145)

## 2018-09-19 LAB — D-DIMER, QUANTITATIVE (NOT AT ARMC): D-Dimer, Quant: 0.37 ug/mL-FEU (ref 0.00–0.50)

## 2018-09-19 NOTE — ED Notes (Signed)
Patient didn't want to wait for second Troponin to be draw, EDP Koh[place] informed, patient left accompanied by significant other, no distress noted, vital signs stable.

## 2018-09-19 NOTE — ED Triage Notes (Signed)
Pt with mid cp that started 30 min ago while laying on bed.  Pt with hx of MI with stent placement.

## 2018-09-19 NOTE — ED Notes (Signed)
Pt took one SL ntg at home at start of cp with no relief.  [facility] gave pt another SL ntg and 324mg  ASA enroute, pt denies CP at present.

## 2018-09-19 NOTE — ED Provider Notes (Signed)
[facility] EMERGENCY DEPARTMENT Provider Note   CSN: [id number] Arrival date & time: 09/19/18  1839     History   Chief Complaint Chief Complaint  Patient presents with  . Chest Pain    HPI Nathan Waters is a 37 y.o. male.  HPI   37 year old male with chest pain.  Symptom onset about 30 minutes prior to arrival.  He states he was at rest laying down when symptoms began.  Substernal chest pain.  He tried taking nitroglycerin with no improvement.  When EMS arrived they gave an additional nitroglycerin and 324 mg of aspirin.  Symptoms have now resolved.  He reports he has been his usual state of health over the past several days leading up to this.  He has a past history of CAD with previous stenting to mid circumflex in February 2013.  He reports no recurrent issues since that time.  Is supposed to be on a statin and aspirin but reports noncompliance.  Past Medical History:  Diagnosis Date  . Asthma   . Coronary atherosclerosis of native coronary artery    BMS to mid circumflex 11/2011, LVEF 55-60%  . Essential hypertension, benign   . Migraine   . Mixed hyperlipidemia   . STEMI (ST elevation myocardial infarction) ([place])    Complicated by VF arrest 11/2011    Patient Active Problem List   Diagnosis Date Noted  . Tobacco use 04/09/2012  . Coronary atherosclerosis of native coronary artery 01/06/2012  . Mixed hyperlipidemia 01/06/2012  . Asthma 11/21/2011    Past Surgical History:  Procedure Laterality Date  . BACK SURGERY    . LEFT HEART CATHETERIZATION WITH CORONARY ANGIOGRAM N/A 11/21/2011   Procedure: LEFT HEART CATHETERIZATION WITH CORONARY ANGIOGRAM;  Surgeon: Peter M Martinique, MD;  Location: [facility] CATH LAB;  Service: Cardiovascular;  Laterality: N/A;  . PERCUTANEOUS CORONARY STENT INTERVENTION (PCI-S) N/A 11/21/2011   Procedure: PERCUTANEOUS CORONARY STENT INTERVENTION (PCI-S);  Surgeon: Peter M Martinique, MD;  Location: [facility] CATH LAB;  Service: Cardiovascular;  Laterality:  N/A;  . SPLENECTOMY          Home Medications    Prior to Admission medications   Medication Sig Start Date End Date Taking? Authorizing Provider  albuterol (PROVENTIL HFA;VENTOLIN HFA) 108 (90 Base) MCG/ACT inhaler Inhale every 6 (six) hours as needed into the lungs for wheezing or shortness of breath.    [provider]  aspirin EC 81 MG tablet Take 81 mg by mouth daily.    [provider]  naproxen (NAPROSYN) 500 MG tablet Take 1 tablet (500 mg total) by mouth 2 (two) times daily as needed for moderate pain. 02/04/18   Rancour, Annie Main, MD  nitroGLYCERIN (NITROSTAT) 0.4 MG SL tablet Place 1 tablet (0.4 mg total) under the tongue every 5 (five) minutes as needed for chest pain. 04/27/17   Satira Sark, MD  pravastatin (PRAVACHOL) 20 MG tablet Take 1 tablet (20 mg total) by mouth every evening. 05/05/17 08/03/17  Satira Sark, MD    Family History Family History  Problem Relation Age of Onset  . Malignant hyperthermia Mother   . Malignant hyperthermia Brother   . Heart disease Brother   . Heart disease Father     Social History Social History   Tobacco Use  . Smoking status: Current Every Day Smoker    Packs/day: 1.00    Types: Cigarettes  . Smokeless tobacco: Never Used  Substance Use Topics  . Alcohol use: Yes  Comment: Occasionally  . Drug use: No     Allergies   Penicillins   Review of Systems Review of Systems  All systems reviewed and negative, other than as noted in HPI.  Physical Exam Updated Vital Signs BP 112/67   Pulse 68   Temp 97.8 F (36.6 C) (Oral)   Resp 18   Ht 5\' 11"  (1.803 m)   Wt 117.9 kg   SpO2 96%   BMI 36.26 kg/m   Physical Exam  Constitutional: He appears well-developed and well-nourished. No distress.  HENT:  Head: Normocephalic and atraumatic.  Eyes: Conjunctivae are normal. Right eye exhibits no discharge. Left eye exhibits no discharge.  Neck: Neck supple.  Cardiovascular: Normal rate,  regular rhythm and normal heart sounds. Exam reveals no gallop and no friction rub.  No murmur heard. Pulmonary/Chest: Effort normal and breath sounds normal. No respiratory distress.  Abdominal: Soft. He exhibits no distension. There is no tenderness.  Musculoskeletal: He exhibits no edema or tenderness.  Lower extremities symmetric as compared to each other. No calf tenderness. Negative Homan's. No palpable cords.   Neurological: He is alert.  Skin: Skin is warm and dry.  Psychiatric: He has a normal mood and affect. His behavior is normal. Thought content normal.  Nursing note and vitals reviewed.    ED Treatments / Results  Labs (all labs ordered are listed, but only abnormal results are displayed) Labs Reviewed  CBC WITH DIFFERENTIAL/PLATELET - Abnormal; Notable for the following components:      Result Value   WBC 14.4 (*)    Neutro Abs 9.7 (*)    Monocytes Absolute 1.1 (*)    All other components within normal limits  BASIC METABOLIC PANEL - Abnormal; Notable for the following components:   Glucose, Bld 110 (*)    Calcium 8.8 (*)    All other components within normal limits  TROPONIN I  D-DIMER, QUANTITATIVE (NOT AT [facility])  TROPONIN I  TROPONIN I    EKG EKG Interpretation  Date/Time:  Wednesday September 19 2018 18:47:15 EST Ventricular Rate:  86 PR Interval:    QRS Duration: 102 QT Interval:  379 QTC Calculation: 712 R Axis:   63 Text Interpretation:  Sinus rhythm No significant change since last tracing Reconfirmed by Virgel Manifold [phone]) on 09/19/2018 9:10:10 PM   Radiology Dg Chest 2 View  Result Date: 09/19/2018 CLINICAL DATA:  Chest pain, shortness of breath, weakness EXAM: CHEST - 2 VIEW COMPARISON:  03/03/2017 FINDINGS: Heart and mediastinal contours are within normal limits. No focal opacities or effusions. No acute bony abnormality. IMPRESSION: No active cardiopulmonary disease. Electronically Signed   By: Rolm Baptise M.D.   On: 09/19/2018 19:31     Procedures Procedures (including critical care time)  Medications Ordered in ED Medications - No data to display   Initial Impression / Assessment and Plan / ED Course  I have reviewed the triage vital signs and the nursing notes.  Pertinent labs & imaging results that were available during my care of the patient were reviewed by me and considered in my medical decision making (see chart for details).     37 year old male with chest pain.  Known CAD.  Symptoms resolved by the time he got to the emergency room and, at present, no recurrence since that time.  Initial troponin normal.  Given the onset of symptoms shortly before arrival though, will repeat troponin.  His EKG does not appear acutely changed from prior.  Noted to be consistently  mildly hypoxemic on room air right around 90%.  He denies any dyspnea.  He sounds clear on exam.  Chest x-ray without focal findings.  We will check a d-dimer with a plan to obtain CTA if elevated.  D-dimer is normal.  He does have a mild leukocytosis of unclear significance.  Per review of records, he has consistently had a leukocytosis across wide time range.  Patient leaving AMA prior to second troponin being resulted.  He has medical decision-making capacity.  Final Clinical Impressions(s) / ED Diagnoses   Final diagnoses:  Chest pain, unspecified type    ED Discharge Orders    None       Virgel Manifold, MD 09/19/18 2239

## 2018-09-19 NOTE — ED Notes (Signed)
RA sats 91%, applied 2 L/M O2 via  [place]

## 2019-06-14 ENCOUNTER — Other Ambulatory Visit: Payer: Self-pay

## 2019-06-14 ENCOUNTER — Observation Stay (HOSPITAL_COMMUNITY)
Admission: EM | Admit: 2019-06-14 | Discharge: 2019-06-15 | Disposition: A | Discharge status: Home / Self Care | Payer: Medicaid Other | Attending: Internal Medicine | Admitting: Internal Medicine

## 2019-06-14 ENCOUNTER — Encounter (HOSPITAL_COMMUNITY): Payer: Self-pay | Admitting: Emergency Medicine

## 2019-06-14 ENCOUNTER — Emergency Department (HOSPITAL_COMMUNITY): Payer: Medicaid Other

## 2019-06-14 DIAGNOSIS — J45909 Unspecified asthma, uncomplicated: Secondary | ICD-10-CM | POA: Insufficient documentation

## 2019-06-14 DIAGNOSIS — Z79899 Other long term (current) drug therapy: Secondary | ICD-10-CM | POA: Insufficient documentation

## 2019-06-14 DIAGNOSIS — R739 Hyperglycemia, unspecified: Secondary | ICD-10-CM | POA: Diagnosis not present

## 2019-06-14 DIAGNOSIS — Z20828 Contact with and (suspected) exposure to other viral communicable diseases: Secondary | ICD-10-CM | POA: Diagnosis not present

## 2019-06-14 DIAGNOSIS — I252 Old myocardial infarction: Secondary | ICD-10-CM | POA: Insufficient documentation

## 2019-06-14 DIAGNOSIS — D72829 Elevated white blood cell count, unspecified: Secondary | ICD-10-CM | POA: Insufficient documentation

## 2019-06-14 DIAGNOSIS — Z8249 Family history of ischemic heart disease and other diseases of the circulatory system: Secondary | ICD-10-CM | POA: Diagnosis not present

## 2019-06-14 DIAGNOSIS — E782 Mixed hyperlipidemia: Secondary | ICD-10-CM | POA: Diagnosis not present

## 2019-06-14 DIAGNOSIS — Z955 Presence of coronary angioplasty implant and graft: Secondary | ICD-10-CM | POA: Insufficient documentation

## 2019-06-14 DIAGNOSIS — K760 Fatty (change of) liver, not elsewhere classified: Secondary | ICD-10-CM | POA: Insufficient documentation

## 2019-06-14 DIAGNOSIS — Z23 Encounter for immunization: Secondary | ICD-10-CM | POA: Insufficient documentation

## 2019-06-14 DIAGNOSIS — R591 Generalized enlarged lymph nodes: Secondary | ICD-10-CM | POA: Insufficient documentation

## 2019-06-14 DIAGNOSIS — Z8674 Personal history of sudden cardiac arrest: Secondary | ICD-10-CM | POA: Diagnosis not present

## 2019-06-14 DIAGNOSIS — K8010 Calculus of gallbladder with chronic cholecystitis without obstruction: Secondary | ICD-10-CM | POA: Diagnosis not present

## 2019-06-14 DIAGNOSIS — K819 Cholecystitis, unspecified: Secondary | ICD-10-CM | POA: Diagnosis present

## 2019-06-14 DIAGNOSIS — I2510 Atherosclerotic heart disease of native coronary artery without angina pectoris: Secondary | ICD-10-CM | POA: Diagnosis not present

## 2019-06-14 DIAGNOSIS — I10 Essential (primary) hypertension: Secondary | ICD-10-CM | POA: Diagnosis not present

## 2019-06-14 DIAGNOSIS — F17210 Nicotine dependence, cigarettes, uncomplicated: Secondary | ICD-10-CM | POA: Insufficient documentation

## 2019-06-14 DIAGNOSIS — Z9081 Acquired absence of spleen: Secondary | ICD-10-CM | POA: Insufficient documentation

## 2019-06-14 LAB — COMPREHENSIVE METABOLIC PANEL
ALT: 43 U/L (ref 0–44)
AST: 26 U/L (ref 15–41)
Albumin: 4.5 g/dL (ref 3.5–5.0)
Alkaline Phosphatase: 120 U/L (ref 38–126)
Anion gap: 9 (ref 5–15)
BUN: 10 mg/dL (ref 6–20)
CO2: 24 mmol/L (ref 22–32)
Calcium: 9.9 mg/dL (ref 8.9–10.3)
Chloride: 107 mmol/L (ref 98–111)
Creatinine, Ser: 1.03 mg/dL (ref 0.61–1.24)
GFR calc Af Amer: >60 mL/min (ref >60)
GFR calc non Af Amer: >60 mL/min (ref >60)
Glucose, Bld: 126 mg/dL — ABNORMAL HIGH (ref 70–99)
Potassium: 4.2 mmol/L (ref 3.5–5.1)
Sodium: 140 mmol/L (ref 135–145)
Total Bilirubin: 0.3 mg/dL (ref 0.3–1.2)
Total Protein: 7.7 g/dL (ref 6.5–8.1)

## 2019-06-14 LAB — URINALYSIS, ROUTINE W REFLEX MICROSCOPIC
Bilirubin Urine: NEGATIVE
Glucose, UA: NEGATIVE mg/dL
Hgb urine dipstick: NEGATIVE
Ketones, ur: 20 mg/dL — AB
Leukocytes,Ua: NEGATIVE
Nitrite: NEGATIVE
Protein, ur: NEGATIVE mg/dL
Specific Gravity, Urine: 1.025 (ref 1.005–1.030)
pH: 6 (ref 5.0–8.0)

## 2019-06-14 LAB — CBC
HCT: 51.1 % (ref 39.0–52.0)
Hemoglobin: 16.6 g/dL (ref 13.0–17.0)
MCH: 32.2 pg (ref 26.0–34.0)
MCHC: 32.5 g/dL (ref 30.0–36.0)
MCV: 99 fL (ref 80.0–100.0)
Platelets: 357 10*3/uL (ref 150–400)
RBC: 5.16 MIL/uL (ref 4.22–5.81)
RDW: 14.4 % (ref 11.5–15.5)
WBC: 15.7 10*3/uL — ABNORMAL HIGH (ref 4.0–10.5)
nRBC: 0 % (ref 0.0–0.2)

## 2019-06-14 LAB — LIPASE, BLOOD: Lipase: 21 U/L (ref 11–51)

## 2019-06-14 MED ORDER — IOHEXOL 300 MG/ML  SOLN
100.0000 mL | Freq: Once | INTRAMUSCULAR | Status: AC | PRN
  Administered 2019-06-15: 100 mL via INTRAVENOUS

## 2019-06-14 MED ORDER — SODIUM CHLORIDE 0.9% FLUSH
3.0000 mL | Freq: Once | INTRAVENOUS | Status: AC
  Administered 2019-06-14: 23:00:00 3 mL via INTRAVENOUS

## 2019-06-14 MED ORDER — MORPHINE SULFATE (PF) 4 MG/ML IV SOLN
4.0000 mg | Freq: Once | INTRAVENOUS | Status: AC
  Administered 2019-06-15: 4 mg via INTRAVENOUS
  Filled 2019-06-14: qty 1

## 2019-06-14 MED ORDER — ONDANSETRON HCL 4 MG/2ML IJ SOLN
4.0000 mg | Freq: Once | INTRAMUSCULAR | Status: AC
  Administered 2019-06-15: 4 mg via INTRAVENOUS
  Filled 2019-06-14: qty 2

## 2019-06-14 NOTE — ED Provider Notes (Signed)
[facility] EMERGENCY DEPARTMENT Provider Note   CSN: [id number] Arrival date & time: 06/14/19  1658     History   Chief Complaint Chief Complaint  Patient presents with  . Abdominal Pain    HPI Nathan Waters is a 38 y.o. male.     Patient is a 38 year old male who presents to the emergency department with a complaint of abdominal pain.  Patient has a history of ST elevation MI, mixed hyperlipidemia, essential hypertension, asthma, and tobacco use.  Patient states that this problem started around lunch time today.  It was on both sides, and then went to the epigastric area and then to his mid back.  The patient states that the pain is been mostly constant.  There is been no fever reported.  No blood in his stool.  No vomitus reported.  No fever.  The patient states the pain is better when he is walking.  Nothing really seems to make the pain any worse.  He denies the use of excessive alcohol.  Says is been approximately a month since he had any alcoholic beverage.  He denies use of any recreational drugs.  He is a smoker.  The patient states that he uses ibuprofen approximately once or twice per week otherwise no NSAIDs.  The last bowel movement was on yesterday, and he says it was actually runny.  It is of note that the patient had sausage and bacon just prior to the lunchtime hour.  He says it was shortly after that that he began to have this increased pain.  The history is provided by the patient.  Abdominal Pain Pain location:  RUQ and suprapubic Associated symptoms: no chest pain, no cough, no dysuria, no hematuria and no shortness of breath     Past Medical History:  Diagnosis Date  . Asthma   . Coronary atherosclerosis of native coronary artery    BMS to mid circumflex 11/2011, LVEF 55-60%  . Essential hypertension, benign   . Migraine   . Mixed hyperlipidemia   . STEMI (ST elevation myocardial infarction) ([place])    Complicated by VF arrest 11/2011    Patient  Active Problem List   Diagnosis Date Noted  . Tobacco use 04/09/2012  . Coronary atherosclerosis of native coronary artery 01/06/2012  . Mixed hyperlipidemia 01/06/2012  . Asthma 11/21/2011    Past Surgical History:  Procedure Laterality Date  . BACK SURGERY    . LEFT HEART CATHETERIZATION WITH CORONARY ANGIOGRAM N/A 11/21/2011   Procedure: LEFT HEART CATHETERIZATION WITH CORONARY ANGIOGRAM;  Surgeon: Peter M Martinique, MD;  Location: [facility] CATH LAB;  Service: Cardiovascular;  Laterality: N/A;  . PERCUTANEOUS CORONARY STENT INTERVENTION (PCI-S) N/A 11/21/2011   Procedure: PERCUTANEOUS CORONARY STENT INTERVENTION (PCI-S);  Surgeon: Peter M Martinique, MD;  Location: [facility] CATH LAB;  Service: Cardiovascular;  Laterality: N/A;  . SPLENECTOMY          Home Medications    Prior to Admission medications   Medication Sig Start Date End Date Taking? Authorizing Provider  albuterol (PROVENTIL HFA;VENTOLIN HFA) 108 (90 Base) MCG/ACT inhaler Inhale every 6 (six) hours as needed into the lungs for wheezing or shortness of breath.    [provider]  nitroGLYCERIN (NITROSTAT) 0.4 MG SL tablet Place 1 tablet (0.4 mg total) under the tongue every 5 (five) minutes as needed for chest pain. 04/27/17   Satira Sark, MD    Family History Family History  Problem Relation Age of Onset  . Malignant hyperthermia  Mother   . Malignant hyperthermia Brother   . Heart disease Brother   . Heart disease Father     Social History Social History   Tobacco Use  . Smoking status: Current Every Day Smoker    Packs/day: 1.00    Types: Cigarettes  . Smokeless tobacco: Never Used  Substance Use Topics  . Alcohol use: Yes    Comment: Occasionally  . Drug use: No     Allergies   Penicillins   Review of Systems Review of Systems  Constitutional: Negative for activity change.       All ROS Neg except as noted in HPI  HENT: Negative.   Eyes: Negative for photophobia and discharge.  Respiratory:  Negative for cough, shortness of breath and wheezing.   Cardiovascular: Negative for chest pain and palpitations.  Gastrointestinal: Positive for abdominal pain. Negative for blood in stool.  Genitourinary: Negative for dysuria, frequency and hematuria.  Musculoskeletal: Negative for arthralgias, back pain and neck pain.  Skin: Negative.   Neurological: Negative for dizziness, seizures and speech difficulty.  Psychiatric/Behavioral: Negative for confusion and hallucinations.     Physical Exam Updated Vital Signs BP (!) 136/97   Pulse 86   Temp 98 F (36.7 C) (Oral)   Resp 16   Ht 5\' 11"  (1.803 m)   Wt 117.9 kg   SpO2 96%   BMI 36.26 kg/m   Physical Exam Vitals signs and nursing note reviewed.  Constitutional:      Appearance: He is well-developed. He is not toxic-appearing.  HENT:     Head: Normocephalic.     Right Ear: Tympanic membrane and external ear normal.     Left Ear: Tympanic membrane and external ear normal.  Eyes:     General: Lids are normal.     Pupils: Pupils are equal, round, and reactive to light.  Neck:     Musculoskeletal: Normal range of motion and neck supple.     Vascular: No carotid bruit.  Cardiovascular:     Rate and Rhythm: Normal rate and regular rhythm.     Pulses: Normal pulses.     Heart sounds: Normal heart sounds.  Pulmonary:     Effort: No respiratory distress.     Breath sounds: Normal breath sounds.  Abdominal:     General: Bowel sounds are normal.     Palpations: Abdomen is soft.     Tenderness: There is abdominal tenderness in the right upper quadrant and suprapubic area. There is no guarding.  Musculoskeletal: Normal range of motion.  Lymphadenopathy:     Head:     Right side of head: No submandibular adenopathy.     Left side of head: No submandibular adenopathy.     Cervical: No cervical adenopathy.  Skin:    General: Skin is warm and dry.  Neurological:     Mental Status: He is alert and oriented to person, place, and  time.     Cranial Nerves: No cranial nerve deficit.     Sensory: No sensory deficit.  Psychiatric:        Speech: Speech normal.      ED Treatments / Results  Labs (all labs ordered are listed, but only abnormal results are displayed) Labs Reviewed  COMPREHENSIVE METABOLIC PANEL - Abnormal; Notable for the following components:      Result Value   Glucose, Bld 126 (*)    All other components within normal limits  CBC - Abnormal; Notable for the following components:  WBC 15.7 (*)    All other components within normal limits  URINALYSIS, ROUTINE W REFLEX MICROSCOPIC - Abnormal; Notable for the following components:   Ketones, ur 20 (*)    All other components within normal limits  LIPASE, BLOOD    EKG None  Radiology No results found.  Procedures Procedures (including critical care time)  Medications Ordered in ED Medications  sodium chloride flush (NS) 0.9 % injection 3 mL (has no administration in time range)  iohexol (OMNIPAQUE) 300 MG/ML solution 100 mL (has no administration in time range)     Initial Impression / Assessment and Plan / ED Course  I have reviewed the triage vital signs and the nursing notes.  Pertinent labs & imaging results that were available during my care of the patient were reviewed by me and considered in my medical decision making (see chart for details).          Final Clinical Impressions(s) / ED Diagnoses MDM  Blood pressure slightly elevated at 136/97, otherwise vital signs are within normal limits.  Pulse oximetry is 96% on room air, within normal limits by my interpretation.  Patient presents for abdominal pain in the right upper quadrant, the suprapubic area.  Earlier the pain was on the right and left side, and radiating to the back.  Patient has a history of acute STEMI 6 to 7 years ago.  An EKG was obtained.  Patient has sinus rhythm with probable left atrial enlargement.  The EKG is mostly unchanged from previous  examination. Lipase is normal at 21. Comprehensive metabolic panel is nonacute.  The anion gap is normal at 9.  The complete blood count shows the white blood cells to be elevated at 15,700.  This is the patient's usual range.  He has had a splenectomy since childhood. Urine analysis is within normal limits. Lipase is normal at 21.  Doubt  Acute pancreatitis.  Will obtain CT abd to evaluate further the cause of RUQ pain with radiation to the back and suprapubic pain.  CT pending. Pt's care to be continued by Dr Meda Coffee Tomi Bamberger.   Final diagnoses:  Cholecystitis    ED Discharge Orders    None       Lily Kocher, PA-C 06/15/19 1556    Rolland Porter, MD 06/15/19 [phone]

## 2019-06-14 NOTE — ED Triage Notes (Signed)
Patient c/o abdominal pain with radiation to his back. No n/v/d. Denies urinary symptoms. Onset this am.

## 2019-06-15 ENCOUNTER — Encounter (HOSPITAL_COMMUNITY): Payer: Self-pay

## 2019-06-15 ENCOUNTER — Inpatient Hospital Stay (HOSPITAL_COMMUNITY): Payer: Medicaid Other

## 2019-06-15 DIAGNOSIS — K819 Cholecystitis, unspecified: Secondary | ICD-10-CM

## 2019-06-15 LAB — CBC
HCT: 47.4 % (ref 39.0–52.0)
Hemoglobin: 15.7 g/dL (ref 13.0–17.0)
MCH: 33 pg (ref 26.0–34.0)
MCHC: 33.1 g/dL (ref 30.0–36.0)
MCV: 99.6 fL (ref 80.0–100.0)
Platelets: 327 10*3/uL (ref 150–400)
RBC: 4.76 MIL/uL (ref 4.22–5.81)
RDW: 14.3 % (ref 11.5–15.5)
WBC: 13.6 10*3/uL — ABNORMAL HIGH (ref 4.0–10.5)
nRBC: 0 % (ref 0.0–0.2)

## 2019-06-15 LAB — COMPREHENSIVE METABOLIC PANEL
ALT: 38 U/L (ref 0–44)
AST: 26 U/L (ref 15–41)
Albumin: 4.2 g/dL (ref 3.5–5.0)
Alkaline Phosphatase: 100 U/L (ref 38–126)
Anion gap: 8 (ref 5–15)
BUN: 9 mg/dL (ref 6–20)
CO2: 25 mmol/L (ref 22–32)
Calcium: 9.5 mg/dL (ref 8.9–10.3)
Chloride: 107 mmol/L (ref 98–111)
Creatinine, Ser: 0.84 mg/dL (ref 0.61–1.24)
GFR calc Af Amer: >60 mL/min (ref >60)
GFR calc non Af Amer: >60 mL/min (ref >60)
Glucose, Bld: 88 mg/dL (ref 70–99)
Potassium: 3.7 mmol/L (ref 3.5–5.1)
Sodium: 140 mmol/L (ref 135–145)
Total Bilirubin: 0.7 mg/dL (ref 0.3–1.2)
Total Protein: 7.2 g/dL (ref 6.5–8.1)

## 2019-06-15 LAB — SARS CORONAVIRUS 2 BY RT PCR (HOSPITAL ORDER, PERFORMED IN ~~LOC~~ HOSPITAL LAB): SARS Coronavirus 2: NEGATIVE

## 2019-06-15 MED ORDER — ENOXAPARIN SODIUM 40 MG/0.4ML ~~LOC~~ SOLN: 40.0000 mg | SUBCUTANEOUS | Status: DC

## 2019-06-15 MED ORDER — ATORVASTATIN CALCIUM 20 MG PO TABS: 20.0000 mg | ORAL_TABLET | Freq: Every day | ORAL | Status: DC

## 2019-06-15 MED ORDER — SODIUM CHLORIDE 0.9 % IV SOLN
INTRAVENOUS | Status: DC
  Administered 2019-06-15: 04:00:00 via INTRAVENOUS

## 2019-06-15 MED ORDER — CIPROFLOXACIN HCL 500 MG PO TABS: 500.0000 mg | ORAL_TABLET | Freq: Two times a day (BID) | ORAL | 0 refills | Status: AC

## 2019-06-15 MED ORDER — TRAMADOL HCL 50 MG PO TABS: 50.0000 mg | ORAL_TABLET | Freq: Four times a day (QID) | ORAL | Status: DC | PRN

## 2019-06-15 MED ORDER — ENOXAPARIN SODIUM 60 MG/0.6ML ~~LOC~~ SOLN: 60.0000 mg | SUBCUTANEOUS | Status: DC

## 2019-06-15 MED ORDER — CIPROFLOXACIN IN D5W 400 MG/200ML IV SOLN: 400.0000 mg | Freq: Two times a day (BID) | INTRAVENOUS | Status: DC

## 2019-06-15 MED ORDER — ACETAMINOPHEN 325 MG PO TABS
650.0000 mg | ORAL_TABLET | Freq: Four times a day (QID) | ORAL | Status: DC | PRN
  Administered 2019-06-15: 650 mg via ORAL
  Filled 2019-06-15: qty 2

## 2019-06-15 MED ORDER — MORPHINE SULFATE (PF) 4 MG/ML IV SOLN
4.0000 mg | INTRAVENOUS | Status: DC | PRN
  Administered 2019-06-15: 4 mg via INTRAVENOUS
  Filled 2019-06-15: qty 1

## 2019-06-15 MED ORDER — ACETAMINOPHEN 650 MG RE SUPP: 650.0000 mg | Freq: Four times a day (QID) | RECTAL | Status: DC | PRN

## 2019-06-15 MED ORDER — CIPROFLOXACIN IN D5W 400 MG/200ML IV SOLN
400.0000 mg | Freq: Once | INTRAVENOUS | Status: AC
  Administered 2019-06-15: 400 mg via INTRAVENOUS
  Filled 2019-06-15: qty 200

## 2019-06-15 MED ORDER — SENNOSIDES-DOCUSATE SODIUM 8.6-50 MG PO TABS
1.0000 | ORAL_TABLET | Freq: Every evening | ORAL | Status: DC | PRN
  Filled 2019-06-15: qty 1

## 2019-06-15 MED ORDER — PNEUMOCOCCAL VAC POLYVALENT 25 MCG/0.5ML IJ INJ
0.5000 mL | INJECTION | INTRAMUSCULAR | Status: AC
  Administered 2019-06-15: 0.5 mL via INTRAMUSCULAR
  Filled 2019-06-15: qty 0.5

## 2019-06-15 MED ORDER — PNEUMOCOCCAL VAC POLYVALENT 25 MCG/0.5ML IJ INJ: 0.5000 mL | INJECTION | INTRAMUSCULAR | Status: DC

## 2019-06-15 NOTE — Consult Note (Signed)
Reason for Consult: Right upper quadrant abdominal pain Referring Physician: Dr. Otila Back Nathan Waters is an 38 y.o. male.  HPI: Patient is a 38 year old white male who presented emergency room with worsening right upper quadrant abdominal pain and nausea.  He states that this was his first episode.  CT scan of the abdomen revealed cholelithiasis with evidence of cholecystitis.  Ultrasound gallbladder confirmed this.  Patient states that this is his first episode.  He has never had any fever, chills, jaundice, or fatty food intolerance.  He currently has minimal right upper quadrant abdominal pain.  He is hungry and wants to eat.  Past Medical History:  Diagnosis Date  . Asthma   . Coronary atherosclerosis of native coronary artery    BMS to mid circumflex 11/2011, LVEF 55-60%  . Essential hypertension, benign   . Migraine   . Mixed hyperlipidemia   . STEMI (ST elevation myocardial infarction) ([facility])    Complicated by VF arrest 11/2011    Past Surgical History:  Procedure Laterality Date  . BACK SURGERY    . LEFT HEART CATHETERIZATION WITH CORONARY ANGIOGRAM N/A 11/21/2011   Procedure: LEFT HEART CATHETERIZATION WITH CORONARY ANGIOGRAM;  Surgeon: Peter M Martinique, MD;  Location: [facility] CATH LAB;  Service: Cardiovascular;  Laterality: N/A;  . PERCUTANEOUS CORONARY STENT INTERVENTION (PCI-S) N/A 11/21/2011   Procedure: PERCUTANEOUS CORONARY STENT INTERVENTION (PCI-S);  Surgeon: Peter M Martinique, MD;  Location: [facility] CATH LAB;  Service: Cardiovascular;  Laterality: N/A;  . SPLENECTOMY      Family History  Problem Relation Age of Onset  . Malignant hyperthermia Mother   . Malignant hyperthermia Brother   . Heart disease Brother   . Heart disease Father     Social History:  reports that he has been smoking cigarettes. He has been smoking about 1.00 pack per day. He has never used smokeless tobacco. He reports current alcohol use. He reports that he does not use drugs.  Allergies:  Allergies   Allergen Reactions  . Penicillins Anaphylaxis    Medications: I have reviewed the patient's current medications.  Results for orders placed or performed during the hospital encounter of 06/14/19 (from the past 48 hour(s))  Urinalysis, Routine w reflex microscopic     Status: Abnormal   Collection Time: 06/14/19  6:00 PM  Result Value Ref Range   Color, Urine YELLOW YELLOW   APPearance CLEAR CLEAR   Specific Gravity, Urine 1.025 1.005 - 1.030   pH 6.0 5.0 - 8.0   Glucose, UA NEGATIVE NEGATIVE mg/dL   Hgb urine dipstick NEGATIVE NEGATIVE   Bilirubin Urine NEGATIVE NEGATIVE   Ketones, ur 20 (A) NEGATIVE mg/dL   Protein, ur NEGATIVE NEGATIVE mg/dL   Nitrite NEGATIVE NEGATIVE   Leukocytes,Ua NEGATIVE NEGATIVE    Comment: Performed at [facility], [address] [place], [place] [address]  Lipase, blood     Status: None   Collection Time: 06/14/19  7:36 PM  Result Value Ref Range   Lipase 21 11 - 51 U/L    Comment: Performed at [facility], [address], [place],  [address]  Comprehensive metabolic panel     Status: Abnormal   Collection Time: 06/14/19  7:36 PM  Result Value Ref Range   Sodium 140 135 - 145 mmol/L   Potassium 4.2 3.5 - 5.1 mmol/L   Chloride 107 98 - 111 mmol/L   CO2 24 22 - 32 mmol/L   Glucose, Bld 126 (H) 70 - 99 mg/dL  BUN 10 6 - 20 mg/dL   Creatinine, Ser 1.03 0.61 - 1.24 mg/dL   Calcium 9.9 8.9 - 10.3 mg/dL   Total Protein 7.7 6.5 - 8.1 g/dL   Albumin 4.5 3.5 - 5.0 g/dL   AST 26 15 - 41 U/L   ALT 43 0 - 44 U/L   Alkaline Phosphatase 120 38 - 126 U/L   Total Bilirubin 0.3 0.3 - 1.2 mg/dL   GFR calc non Af Amer >60 >60 mL/min   GFR calc Af Amer >60 >60 mL/min   Anion gap 9 5 - 15    Comment: Performed at [facility], [address] [place], [place] [address]  CBC     Status: Abnormal   Collection Time: 06/14/19  7:36 PM  Result Value Ref Range   WBC 15.7 (H) 4.0 - 10.5 K/uL   RBC 5.16 4.22 - 5.81 MIL/uL   Hemoglobin 16.6 13.0 - 17.0 g/dL    HCT 51.1 39.0 - 52.0 %   MCV 99.0 80.0 - 100.0 fL   MCH 32.2 26.0 - 34.0 pg   MCHC 32.5 30.0 - 36.0 g/dL   RDW 14.4 11.5 - 15.5 %   Platelets 357 150 - 400 K/uL   nRBC 0.0 0.0 - 0.2 %    Comment: Performed at [facility], [address] [place], [place] [address]  SARS Coronavirus 2 [facility] order, Performed in [facility] hospital lab) Nasopharyngeal Nasopharyngeal Swab     Status: None   Collection Time: 06/15/19  4:38 AM   Specimen: Nasopharyngeal Swab  Result Value Ref Range   SARS Coronavirus 2 NEGATIVE NEGATIVE    Comment: (NOTE) If result is NEGATIVE SARS-CoV-2 target nucleic acids are NOT DETECTED. The SARS-CoV-2 RNA is generally detectable in upper and lower  respiratory specimens during the acute phase of infection. The lowest  concentration of SARS-CoV-2 viral copies this assay can detect is 250  copies / mL. A negative result does not preclude SARS-CoV-2 infection  and should not be used as the sole basis for treatment or other  patient management decisions.  A negative result may occur with  improper specimen collection / handling, submission of specimen other  than nasopharyngeal swab, presence of viral mutation(s) within the  areas targeted by this assay, and inadequate number of viral copies  (<250 copies / mL). A negative result must be combined with clinical  observations, patient history, and epidemiological information. If result is POSITIVE SARS-CoV-2 target nucleic acids are DETECTED. The SARS-CoV-2 RNA is generally detectable in upper and lower  respiratory specimens dur ing the acute phase of infection.  Positive  results are indicative of active infection with SARS-CoV-2.  Clinical  correlation with patient history and other diagnostic information is  necessary to determine patient infection status.  Positive results do  not rule out bacterial infection or co-infection with other viruses. If result is PRESUMPTIVE POSTIVE SARS-CoV-2 nucleic acids MAY  BE PRESENT.   A presumptive positive result was obtained on the submitted specimen  and confirmed on repeat testing.  While 2019 novel coronavirus  (SARS-CoV-2) nucleic acids may be present in the submitted sample  additional confirmatory testing may be necessary for epidemiological  and / or clinical management purposes  to differentiate between  SARS-CoV-2 and other Sarbecovirus currently known to infect humans.  If clinically indicated additional testing with an alternate test  methodology [id number]) is advised. The SARS-CoV-2 RNA is generally  detectable in upper and lower respiratory sp ecimens during the  acute  phase of infection. The expected result is Negative. Fact Sheet for Patients:  StrictlyIdeas.no Fact Sheet for Healthcare Providers: BankingDealers.co.za This test is not yet approved or cleared by the [place] FDA and has been authorized for detection and/or diagnosis of SARS-CoV-2 by FDA under an Emergency Use Authorization (EUA).  This EUA will remain in effect (meaning this test can be used) for the duration of the COVID-19 declaration under Section 564(b)(1) of the Act, 21 U.S.C. section 360bbb-3(b)(1), unless the authorization is terminated or revoked sooner. Performed at [facility], [address] [place], [place] [address]   Comprehensive metabolic panel     Status: None   Collection Time: 06/15/19  5:18 AM  Result Value Ref Range   Sodium 140 135 - 145 mmol/L   Potassium 3.7 3.5 - 5.1 mmol/L   Chloride 107 98 - 111 mmol/L   CO2 25 22 - 32 mmol/L   Glucose, Bld 88 70 - 99 mg/dL   BUN 9 6 - 20 mg/dL   Creatinine, Ser 0.84 0.61 - 1.24 mg/dL   Calcium 9.5 8.9 - 10.3 mg/dL   Total Protein 7.2 6.5 - 8.1 g/dL   Albumin 4.2 3.5 - 5.0 g/dL   AST 26 15 - 41 U/L   ALT 38 0 - 44 U/L   Alkaline Phosphatase 100 38 - 126 U/L   Total Bilirubin 0.7 0.3 - 1.2 mg/dL   GFR calc non Af Amer >60 >60 mL/min   GFR calc Af  Amer >60 >60 mL/min   Anion gap 8 5 - 15    Comment: Performed at [facility], [address] [place], [place] [address]  CBC     Status: Abnormal   Collection Time: 06/15/19  5:18 AM  Result Value Ref Range   WBC 13.6 (H) 4.0 - 10.5 K/uL   RBC 4.76 4.22 - 5.81 MIL/uL   Hemoglobin 15.7 13.0 - 17.0 g/dL   HCT 47.4 39.0 - 52.0 %   MCV 99.6 80.0 - 100.0 fL   MCH 33.0 26.0 - 34.0 pg   MCHC 33.1 30.0 - 36.0 g/dL   RDW 14.3 11.5 - 15.5 %   Platelets 327 150 - 400 K/uL   nRBC 0.0 0.0 - 0.2 %    Comment: Performed at [facility], [address] [place], [place] [address]    US Abdomen Complete  Result Date: 06/15/2019 CLINICAL DATA:  Nausea, vomiting and epigastric abdominal pain. History prior splenectomy. CT demonstrates cholelithiasis with possible gallbladder inflammation. EXAM: ABDOMEN ULTRASOUND COMPLETE COMPARISON:  CT of the abdomen and pelvis earlier today. FINDINGS: Gallbladder: Multiple large shadowing gallstones are present within the gallbladder. The gallbladder wall appears likely thickened measuring 6-7 mm. No evidence of overt sonographic Murphy sign at the time of the study or pericholecystic fluid. Common bile duct: Diameter: 6 mm. Liver: The liver demonstrates coarse echotexture and increased echogenicity, likely reflecting diffuse steatosis. No overt cirrhotic contour abnormalities or focal lesions are identified. There is no evidence of intrahepatic biliary ductal dilatation. The portal vein is open. Portal vein is patent on color Doppler imaging with normal direction of blood flow towards the liver. IVC: No abnormality visualized. Pancreas: Visualized portion unremarkable. Spleen: Surgically absent. Right Kidney: Length: 13.9 cm. Echogenicity within normal limits. No mass or hydronephrosis visualized. Left Kidney: Length: 14.5 cm. Echogenicity within normal limits. No mass or hydronephrosis visualized. Abdominal aorta: No aneurysm visualized. Other findings: No ascites or focal  fluid collections identified by ultrasound. IMPRESSION: 1.  Cholelithiasis with large gallstones in the gallbladder lumen. The gallbladder wall is thickened. No evidence of biliary ductal dilatation. 2. Hepatic steatosis. Electronically Signed   By: Aletta Edouard M.D.   On: 06/15/2019 09:59   Ct Abdomen Pelvis W Contrast  Result Date: 06/15/2019 CLINICAL DATA:  38 year old male with intermittent abdominal pain radiating to the back. EXAM: CT ABDOMEN AND PELVIS WITH CONTRAST TECHNIQUE: Multidetector CT imaging of the abdomen and pelvis was performed using the standard protocol following bolus administration of intravenous contrast. CONTRAST:  140mL OMNIPAQUE IOHEXOL 300 MG/ML  SOLN COMPARISON:  None. FINDINGS: Lower chest: Minimal bibasilar atelectatic changes. The visualized lung bases are otherwise clear. No intra-abdominal free air or free fluid. Hepatobiliary: Apparent mild fatty infiltration of the liver. No intrahepatic biliary ductal dilatation. There are multiple stones within the gallbladder. The gallbladder wall appears thickened and edematous concerning for acute cholecystitis. Further evaluation with ultrasound is recommended. Pancreas: Unremarkable. No pancreatic ductal dilatation or surrounding inflammatory changes. Spleen: Splenectomy. Adrenals/Urinary Tract: The adrenal glands are unremarkable. There is no hydronephrosis on either side. The visualized ureters appear unremarkable. The urinary bladder is collapsed. Stomach/Bowel: There is no bowel obstruction or active inflammation. The appendix is normal. Vascular/Lymphatic: The abdominal aorta and IVC are unremarkable. No portal venous gas. Multiple mildly enlarged or top-normal upper abdominal lymph node measure up to 13 mm in short axis in the gastrohepatic space. Several scattered small nodular density upper omentum (series 2, image 23) measure up to 7 mm in short axis. Bilateral external iliac adenopathy measure up to 3.4 cm on the right  side. There is a 5.0 x 4.0 cm soft tissue mass in the superficial soft tissues of the anteromedial right upper thigh which may represent an abnormal lymph node or a soft tissue malignancy. Further evaluation with MRI or ultrasound with tissue sampling recommended. Reproductive: The prostate and seminal vesicles are grossly unremarkable. Other: None Musculoskeletal: L4-L5 posterior fusion and disc spacer. No acute osseous pathology. IMPRESSION: 1. Cholelithiasis with findings concerning for acute cholecystitis. Further evaluation with ultrasound is recommended. 2. Bilateral external iliac and gastrohepatic adenopathy. 3. A 5.0 x 4.0 cm soft tissue mass in the superficial soft tissues of the anteromedial right upper thigh may represent an abnormal lymph node or a soft tissue malignancy. Correlation with history of lymphoma is recommended. Further evaluation with MRI or ultrasound with tissue sampling, if clinically indicated, recommended. 4. No bowel obstruction or active inflammation. Normal appendix. Electronically Signed   By: Anner Crete M.D.   On: 06/15/2019 00:43    ROS:  Pertinent items are noted in HPI.  Blood pressure 115/83, pulse 75, temperature 98 F (36.7 C), temperature source Oral, resp. rate 17, height 5\' 11"  (1.803 m), weight 117.9 kg, SpO2 90 %. Physical Exam: Pleasant obese white male no acute distress Head is normocephalic, atraumatic Eyes reveal no scleral icterus Lungs are clear to auscultation with good breath sounds bilaterally Heart examination reveals regular rate and rhythm without S3, S4, murmurs Abdomen is soft, nontender, nondistended.  Could not appreciate hepatomegaly.  No significant Murphy sign elicited.  No rigidity is noted. Extremity examination reveals a 5 cm ovoid deep subcutaneous mass which is somewhat firm, though is difficult to fully assess due to his body habitus, right upper medial thigh.  CT scan images personally  reviewed  Assessment/Plan: Impression: Biliary colic secondary to cholecystitis, cholelithiasis.  First episode.  Patient clinically feels much better. Status post splenectomy the remote past for enlargement and a blood disorder at  [place] Right thigh mass as well as generalized intra-abdominal lymphadenopathy of unknown etiology  Plan: No need for acute surgical invention at this point.  Patient would like to go home.  Should he tolerate a regular diet well, discharge is okay.  I will see him in follow-up in several weeks.  I think his generalized lymphadenopathy and right thigh mass need to be addressed sooner.  This was discussed with Dr. Manuella Ghazi.  Will arrange for oncology follow-up.  Aviva Signs 06/15/2019, 10:23 AM

## 2019-06-15 NOTE — Plan of Care (Signed)
Pt admitted to floor from ED. Plan per MD is to feed patient and evaluate if further n/v/ or abd pain. If none, pt will be discharged home this afternoon.

## 2019-06-15 NOTE — ED Notes (Signed)
Patient transported to CT 

## 2019-06-15 NOTE — ED Notes (Signed)
Pt given ice chips per Dr Tomi Bamberger okay

## 2019-06-15 NOTE — ED Provider Notes (Signed)
I talked to the patient about his CT result.  He wanted to go home and take his car home however he is going to stay.  He has a penicillin reaction described as anaphylaxis, he was given IV Cipro.  I will talk to the surgeon about admission.  2:48 AM Dr. Arnoldo Morale, surgeon, states to have the hospitalist admit and he will see patient in the morning.  03:12 AM Dr Clearence Ped, hospitalist, will admit   Ct Abdomen Pelvis W Contrast  Result Date: 06/15/2019 CLINICAL DATA:  38 year old male with intermittent abdominal pain radiating to the back. EXAM: CT ABDOMEN AND PELVIS WITH CONTRAST TECHNIQUE: Multidetector CT imaging of the abdomen and pelvis was performed using the standard protocol following bolus administration of intravenous contrast. CONTRAST:  127mL OMNIPAQUE IOHEXOL 300 MG/ML  SOLN COMPARISON:  None. FINDINGS: Lower chest: Minimal bibasilar atelectatic changes. The visualized lung bases are otherwise clear. No intra-abdominal free air or free fluid. Hepatobiliary: Apparent mild fatty infiltration of the liver. No intrahepatic biliary ductal dilatation. There are multiple stones within the gallbladder. The gallbladder wall appears thickened and edematous concerning for acute cholecystitis. Further evaluation with ultrasound is recommended. Pancreas: Unremarkable. No pancreatic ductal dilatation or surrounding inflammatory changes. Spleen: Splenectomy. Adrenals/Urinary Tract: The adrenal glands are unremarkable. There is no hydronephrosis on either side. The visualized ureters appear unremarkable. The urinary bladder is collapsed. Stomach/Bowel: There is no bowel obstruction or active inflammation. The appendix is normal. Vascular/Lymphatic: The abdominal aorta and IVC are unremarkable. No portal venous gas. Multiple mildly enlarged or top-normal upper abdominal lymph node measure up to 13 mm in short axis in the gastrohepatic space. Several scattered small nodular density upper omentum (series 2, image  23) measure up to 7 mm in short axis. Bilateral external iliac adenopathy measure up to 3.4 cm on the right side. There is a 5.0 x 4.0 cm soft tissue mass in the superficial soft tissues of the anteromedial right upper thigh which may represent an abnormal lymph node or a soft tissue malignancy. Further evaluation with MRI or ultrasound with tissue sampling recommended. Reproductive: The prostate and seminal vesicles are grossly unremarkable. Other: None Musculoskeletal: L4-L5 posterior fusion and disc spacer. No acute osseous pathology. IMPRESSION: 1. Cholelithiasis with findings concerning for acute cholecystitis. Further evaluation with ultrasound is recommended. 2. Bilateral external iliac and gastrohepatic adenopathy. 3. A 5.0 x 4.0 cm soft tissue mass in the superficial soft tissues of the anteromedial right upper thigh may represent an abnormal lymph node or a soft tissue malignancy. Correlation with history of lymphoma is recommended. Further evaluation with MRI or ultrasound with tissue sampling, if clinically indicated, recommended. 4. No bowel obstruction or active inflammation. Normal appendix. Electronically Signed   By: Anner Crete M.D.   On: 06/15/2019 00:43   Medications  ciprofloxacin (CIPRO) IVPB 400 mg (400 mg Intravenous New Bag/Given 06/15/19 0207)  sodium chloride flush (NS) 0.9 % injection 3 mL (3 mLs Intravenous Given 06/14/19 2307)  iohexol (OMNIPAQUE) 300 MG/ML solution 100 mL (100 mLs Intravenous Contrast Given 06/15/19 0021)  morphine 4 MG/ML injection 4 mg (4 mg Intravenous Given 06/15/19 0012)  ondansetron (ZOFRAN) injection 4 mg (4 mg Intravenous Given 06/15/19 0012)   Diagnoses that have been ruled out:  None  Diagnoses that are still under consideration:  None  Final diagnoses:  Cholecystitis     Plan admission  Rolland Porter, MD, Barbette Or, MD 06/15/19 [phone]

## 2019-06-15 NOTE — ED Notes (Signed)
Pt ambulatory to bathroom and back to room 

## 2019-06-15 NOTE — Discharge Summary (Signed)
Physician Discharge Summary  Nathan Waters [id number] DOB: 11/05/1980 DOA: 06/14/2019  PCP: Sandria Manly Alvarado date: [id number]  Discharge date: 06/15/2019  Admitted From:Home  Disposition:  Home  Recommendations for Outpatient Follow-up:  1. Follow up with PCP in 1-2 weeks 2. Follow-up with Dr. Arnoldo Morale in 2 weeks 3. Remain on ciprofloxacin as prescribed twice a day for 7 days to complete course of treatment for cholecystitis 4. Follow-up with Dr. Delton Coombes in the next 1 to 2 weeks for soft tissue mass noted to inner thigh with lymphadenopathy  Home Health: None  Equipment/Devices: None  Discharge Condition: Stable  CODE STATUS: Full  Diet recommendation: Heart Healthy  Brief/Interim Summary: Per HPI: Nathan Waters  is a 38 y.o. 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  Patient reports he had bacon and sausage for lunch and has had abdominal pain since the abdominal pain is described as sharp and aching.  It is an 8 out of 10 at its worst is a 6 out of 10 now.  Is alleviated by walking around.  It waxes and wanes.  The waxing is unpredictable.  It is worse with palpation.  No associated symptoms including no fever nausea or diarrhea.  CT scan shows findings consistent with cholecystitis.  Right upper quadrant ultrasound can be done at night.  General surgery was consulted and reports I will see him in the morning.  Patient admitted for suspicion of acute cholecystitis.  He is overall much improved this morning and had received some IV ciprofloxacin.  He has undergone ultrasound of his abdomen with findings of cholelithiasis and thickened gallbladder wall, but no suggestion of acute cholecystitis or common bile duct dilation.  He has been assessed by general surgery and started on diet which he has tolerated.  It appears appropriate that patient can discharge at this point with no need  for any acute intervention or continued IV fluid or antibiotics.  According to general surgery, he may follow-up outpatient in 2 weeks.  He also is noted to have a right sided inner thigh mass with some lymphadenopathy which will need close follow-up with oncology to further direct his care regarding biopsy or imaging.  Patient is agreeable to these follow-ups and has had no other acute events during the course of this admission.  Discharge Diagnoses:  Active Problems:   Cholecystitis    Discharge Instructions  Discharge Instructions    Diet - low sodium heart healthy   Complete by: As directed    Increase activity slowly   Complete by: As directed      Allergies as of 06/15/2019      Reactions   Penicillins Anaphylaxis      Medication List    TAKE these medications   albuterol 108 (90 Base) MCG/ACT inhaler Commonly known as: VENTOLIN HFA Inhale every 6 (six) hours as needed into the lungs for wheezing or shortness of breath.   ciprofloxacin 500 MG tablet Commonly known as: Cipro Take 1 tablet (500 mg total) by mouth 2 (two) times daily for 7 days.   nitroGLYCERIN 0.4 MG SL tablet Commonly known as: NITROSTAT Place 1 tablet (0.4 mg total) under the tongue every 5 (five) minutes as needed for chest pain.      Follow-up Information    Aviva Signs, MD. Call in 2 week(s).   Specialty: General Surgery Why: Call my office for follow up appointment in 2-3 weeks. Contact information:  [address] [place] [id number] [phone]        Derek Jack, MD Follow up in 1 week(s).   Specialty: Hematology Contact information: [place] [address] [phone]        [facility] [place] Follow up in 1 week(s).   Contact information: [address] [place]  [address] [phone]          Allergies  Allergen Reactions  . Penicillins Anaphylaxis    Consultations:  General surgery   Procedures/Studies: US  Abdomen Complete  Result Date: 06/15/2019 CLINICAL DATA:  Nausea, vomiting and epigastric abdominal pain. History prior splenectomy. CT demonstrates cholelithiasis with possible gallbladder inflammation. EXAM: ABDOMEN ULTRASOUND COMPLETE COMPARISON:  CT of the abdomen and pelvis earlier today. FINDINGS: Gallbladder: Multiple large shadowing gallstones are present within the gallbladder. The gallbladder wall appears likely thickened measuring 6-7 mm. No evidence of overt sonographic Murphy sign at the time of the study or pericholecystic fluid. Common bile duct: Diameter: 6 mm. Liver: The liver demonstrates coarse echotexture and increased echogenicity, likely reflecting diffuse steatosis. No overt cirrhotic contour abnormalities or focal lesions are identified. There is no evidence of intrahepatic biliary ductal dilatation. The portal vein is open. Portal vein is patent on color Doppler imaging with normal direction of blood flow towards the liver. IVC: No abnormality visualized. Pancreas: Visualized portion unremarkable. Spleen: Surgically absent. Right Kidney: Length: 13.9 cm. Echogenicity within normal limits. No mass or hydronephrosis visualized. Left Kidney: Length: 14.5 cm. Echogenicity within normal limits. No mass or hydronephrosis visualized. Abdominal aorta: No aneurysm visualized. Other findings: No ascites or focal fluid collections identified by ultrasound. IMPRESSION: 1. Cholelithiasis with large gallstones in the gallbladder lumen. The gallbladder wall is thickened. No evidence of biliary ductal dilatation. 2. Hepatic steatosis. Electronically Signed   By: Aletta Edouard M.D.   On: 06/15/2019 09:59   Ct Abdomen Pelvis W Contrast  Result Date: 06/15/2019 CLINICAL DATA:  38 year old male with intermittent abdominal pain radiating to the back. EXAM: CT ABDOMEN AND PELVIS WITH CONTRAST TECHNIQUE: Multidetector CT imaging of the abdomen and pelvis was performed using the standard protocol following  bolus administration of intravenous contrast. CONTRAST:  177mL OMNIPAQUE IOHEXOL 300 MG/ML  SOLN COMPARISON:  None. FINDINGS: Lower chest: Minimal bibasilar atelectatic changes. The visualized lung bases are otherwise clear. No intra-abdominal free air or free fluid. Hepatobiliary: Apparent mild fatty infiltration of the liver. No intrahepatic biliary ductal dilatation. There are multiple stones within the gallbladder. The gallbladder wall appears thickened and edematous concerning for acute cholecystitis. Further evaluation with ultrasound is recommended. Pancreas: Unremarkable. No pancreatic ductal dilatation or surrounding inflammatory changes. Spleen: Splenectomy. Adrenals/Urinary Tract: The adrenal glands are unremarkable. There is no hydronephrosis on either side. The visualized ureters appear unremarkable. The urinary bladder is collapsed. Stomach/Bowel: There is no bowel obstruction or active inflammation. The appendix is normal. Vascular/Lymphatic: The abdominal aorta and IVC are unremarkable. No portal venous gas. Multiple mildly enlarged or top-normal upper abdominal lymph node measure up to 13 mm in short axis in the gastrohepatic space. Several scattered small nodular density upper omentum (series 2, image 23) measure up to 7 mm in short axis. Bilateral external iliac adenopathy measure up to 3.4 cm on the right side. There is a 5.0 x 4.0 cm soft tissue mass in the superficial soft tissues of the anteromedial right upper thigh which may represent an abnormal lymph node or a soft tissue malignancy. Further evaluation with MRI or ultrasound with tissue sampling  recommended. Reproductive: The prostate and seminal vesicles are grossly unremarkable. Other: None Musculoskeletal: L4-L5 posterior fusion and disc spacer. No acute osseous pathology. IMPRESSION: 1. Cholelithiasis with findings concerning for acute cholecystitis. Further evaluation with ultrasound is recommended. 2. Bilateral external iliac and  gastrohepatic adenopathy. 3. A 5.0 x 4.0 cm soft tissue mass in the superficial soft tissues of the anteromedial right upper thigh may represent an abnormal lymph node or a soft tissue malignancy. Correlation with history of lymphoma is recommended. Further evaluation with MRI or ultrasound with tissue sampling, if clinically indicated, recommended. 4. No bowel obstruction or active inflammation. Normal appendix. Electronically Signed   By: Anner Crete M.D.   On: 06/15/2019 00:43     Discharge Exam: Vitals:   06/15/19 0630 06/15/19 0810  BP: 115/83   Pulse: 93 75  Resp: 17   Temp:    SpO2: 94% 90%   Vitals:   06/15/19 0012 06/15/19 0600 06/15/19 0630 06/15/19 0810  BP: 129/86 126/69 115/83   Pulse: 88 69 93 75  Resp: 15 17 17    Temp:      TempSrc:      SpO2: 96% 94% 94% 90%  Weight:      Height:        General: Pt is alert, awake, not in acute distress Cardiovascular: RRR, S1/S2 +, no rubs, no gallops Respiratory: CTA bilaterally, no wheezing, no rhonchi Abdominal: Soft, NT, ND, bowel sounds + Extremities: no edema, no cyanosis    The results of significant diagnostics from this hospitalization (including imaging, microbiology, ancillary and laboratory) are listed below for reference.     Microbiology: Recent Results (from the past 240 hour(s))  SARS Coronavirus 2 [facility] order, Performed in [facility] hospital lab) Nasopharyngeal Nasopharyngeal Swab     Status: None   Collection Time: 06/15/19  4:38 AM   Specimen: Nasopharyngeal Swab  Result Value Ref Range Status   SARS Coronavirus 2 NEGATIVE NEGATIVE Final    Comment: (NOTE) If result is NEGATIVE SARS-CoV-2 target nucleic acids are NOT DETECTED. The SARS-CoV-2 RNA is generally detectable in upper and lower  respiratory specimens during the acute phase of infection. The lowest  concentration of SARS-CoV-2 viral copies this assay can detect is 250  copies / mL. A negative result does not preclude SARS-CoV-2  infection  and should not be used as the sole basis for treatment or other  patient management decisions.  A negative result may occur with  improper specimen collection / handling, submission of specimen other  than nasopharyngeal swab, presence of viral mutation(s) within the  areas targeted by this assay, and inadequate number of viral copies  (<250 copies / mL). A negative result must be combined with clinical  observations, patient history, and epidemiological information. If result is POSITIVE SARS-CoV-2 target nucleic acids are DETECTED. The SARS-CoV-2 RNA is generally detectable in upper and lower  respiratory specimens dur ing the acute phase of infection.  Positive  results are indicative of active infection with SARS-CoV-2.  Clinical  correlation with patient history and other diagnostic information is  necessary to determine patient infection status.  Positive results do  not rule out bacterial infection or co-infection with other viruses. If result is PRESUMPTIVE POSTIVE SARS-CoV-2 nucleic acids MAY BE PRESENT.   A presumptive positive result was obtained on the submitted specimen  and confirmed on repeat testing.  While 2019 novel coronavirus  (SARS-CoV-2) nucleic acids may be present in the submitted sample  additional confirmatory testing may be  necessary for epidemiological  and / or clinical management purposes  to differentiate between  SARS-CoV-2 and other Sarbecovirus currently known to infect humans.  If clinically indicated additional testing with an alternate test  methodology [phone]) is advised. The SARS-CoV-2 RNA is generally  detectable in upper and lower respiratory sp ecimens during the acute  phase of infection. The expected result is Negative. Fact Sheet for Patients:  StrictlyIdeas.no Fact Sheet for Healthcare Providers: BankingDealers.co.za This test is not yet approved or cleared by the [place]  FDA and has been authorized for detection and/or diagnosis of SARS-CoV-2 by FDA under an Emergency Use Authorization (EUA).  This EUA will remain in effect (meaning this test can be used) for the duration of the COVID-19 declaration under Section 564(b)(1) of the Act, 21 U.S.C. section 360bbb-3(b)(1), unless the authorization is terminated or revoked sooner. Performed at [facility], [address], [place], [place] [address]      Labs: BNP (last 3 results) No results for input(s): BNP in the last 8760 hours. Basic Metabolic Panel: Recent Labs  Lab 06/14/19 1936 06/15/19 0518  NA 140 140  K 4.2 3.7  CL 107 107  CO2 24 25  GLUCOSE 126* 88  BUN 10 9  CREATININE 1.03 0.84  CALCIUM 9.9 9.5   Liver Function Tests: Recent Labs  Lab 06/14/19 1936 06/15/19 0518  AST 26 26  ALT 43 38  ALKPHOS 120 100  BILITOT 0.3 0.7  PROT 7.7 7.2  ALBUMIN 4.5 4.2   Recent Labs  Lab 06/14/19 1936  LIPASE 21   No results for input(s): AMMONIA in the last 168 hours. CBC: Recent Labs  Lab 06/14/19 1936 06/15/19 0518  WBC 15.7* 13.6*  HGB 16.6 15.7  HCT 51.1 47.4  MCV 99.0 99.6  PLT 357 327   Cardiac Enzymes: No results for input(s): CKTOTAL, CKMB, CKMBINDEX, TROPONINI in the last 168 hours. BNP: Invalid input(s): POCBNP CBG: No results for input(s): GLUCAP in the last 168 hours. D-Dimer No results for input(s): DDIMER in the last 72 hours. Hgb A1c No results for input(s): HGBA1C in the last 72 hours. Lipid Profile No results for input(s): CHOL, HDL, LDLCALC, TRIG, CHOLHDL, LDLDIRECT in the last 72 hours. Thyroid function studies No results for input(s): TSH, T4TOTAL, T3FREE, THYROIDAB in the last 72 hours.  Invalid input(s): FREET3 Anemia work up No results for input(s): VITAMINB12, FOLATE, FERRITIN, TIBC, IRON, RETICCTPCT in the last 72 hours. Urinalysis    Component Value Date/Time   COLORURINE YELLOW 06/14/2019 1800   APPEARANCEUR CLEAR 06/14/2019 1800   LABSPEC  1.025 06/14/2019 1800   PHURINE 6.0 06/14/2019 1800   GLUCOSEU NEGATIVE 06/14/2019 1800   HGBUR NEGATIVE 06/14/2019 1800   BILIRUBINUR NEGATIVE 06/14/2019 1800   KETONESUR 20 (A) 06/14/2019 1800   PROTEINUR NEGATIVE 06/14/2019 1800   UROBILINOGEN 1.0 11/21/2011 1125   NITRITE NEGATIVE 06/14/2019 1800   LEUKOCYTESUR NEGATIVE 06/14/2019 1800   Sepsis Labs Invalid input(s): PROCALCITONIN,  WBC,  LACTICIDVEN Microbiology Recent Results (from the past 240 hour(s))  SARS Coronavirus 2 [facility] order, Performed in [facility] hospital lab) Nasopharyngeal Nasopharyngeal Swab     Status: None   Collection Time: 06/15/19  4:38 AM   Specimen: Nasopharyngeal Swab  Result Value Ref Range Status   SARS Coronavirus 2 NEGATIVE NEGATIVE Final    Comment: (NOTE) If result is NEGATIVE SARS-CoV-2 target nucleic acids are NOT DETECTED. The SARS-CoV-2 RNA is generally detectable in upper and lower  respiratory specimens during the acute  phase of infection. The lowest  concentration of SARS-CoV-2 viral copies this assay can detect is 250  copies / mL. A negative result does not preclude SARS-CoV-2 infection  and should not be used as the sole basis for treatment or other  patient management decisions.  A negative result may occur with  improper specimen collection / handling, submission of specimen other  than nasopharyngeal swab, presence of viral mutation(s) within the  areas targeted by this assay, and inadequate number of viral copies  (<250 copies / mL). A negative result must be combined with clinical  observations, patient history, and epidemiological information. If result is POSITIVE SARS-CoV-2 target nucleic acids are DETECTED. The SARS-CoV-2 RNA is generally detectable in upper and lower  respiratory specimens dur ing the acute phase of infection.  Positive  results are indicative of active infection with SARS-CoV-2.  Clinical  correlation with patient history and other diagnostic  information is  necessary to determine patient infection status.  Positive results do  not rule out bacterial infection or co-infection with other viruses. If result is PRESUMPTIVE POSTIVE SARS-CoV-2 nucleic acids MAY BE PRESENT.   A presumptive positive result was obtained on the submitted specimen  and confirmed on repeat testing.  While 2019 novel coronavirus  (SARS-CoV-2) nucleic acids may be present in the submitted sample  additional confirmatory testing may be necessary for epidemiological  and / or clinical management purposes  to differentiate between  SARS-CoV-2 and other Sarbecovirus currently known to infect humans.  If clinically indicated additional testing with an alternate test  methodology [phone]) is advised. The SARS-CoV-2 RNA is generally  detectable in upper and lower respiratory sp ecimens during the acute  phase of infection. The expected result is Negative. Fact Sheet for Patients:  StrictlyIdeas.no Fact Sheet for Healthcare Providers: BankingDealers.co.za This test is not yet approved or cleared by the [place] FDA and has been authorized for detection and/or diagnosis of SARS-CoV-2 by FDA under an Emergency Use Authorization (EUA).  This EUA will remain in effect (meaning this test can be used) for the duration of the COVID-19 declaration under Section 564(b)(1) of the Act, 21 U.S.C. section 360bbb-3(b)(1), unless the authorization is terminated or revoked sooner. Performed at [facility], [address], [place], [place] [address]      Time coordinating discharge: 35 minutes  SIGNED:   Rodena Goldmann, DO [facility] 06/15/2019, 1:28 PM  If 7PM-7AM, please contact night-coverage [email or web address] Password [email or web address]

## 2019-06-15 NOTE — H&P (Signed)
TRH H&P    Patient Demographics:    Nathan Waters, is a 38 y.o. male  MRN: [id number]  DOB - Nov 16, 1980  Admit Date - 06/14/2019  Referring MD/NP/PA: Dr. Tomi Bamberger   Outpatient Primary MD for the patient is [facility]  Patient coming from: Home   Chief complaint- Abdominal pain   HPI:    Nathan Waters  is a 38 y.o. male, with history of MI in his 59s, hypertension, hyperlipidemia, presents today with abdominal pain since lunchtime.  Patient reports he had bacon and sausage for lunch and has had abdominal pain since the abdominal pain is described as sharp and aching.  It is an 8 out of 10 at its worst is a 6 out of 10 now.  Is alleviated by walking around.  It waxes and wanes.  The waxing is unpredictable.  It is worse with palpation.  No associated symptoms including no fever nausea or diarrhea.  CT scan shows findings consistent with cholecystitis.  Right upper quadrant ultrasound can be done at night.  General surgery was consulted and reports I will see him in the morning.  ED course Patient had grossly normal blood work with the exception of leukocytosis and hyperglycemia.  CT scan showed 1 cholelithiasis with findings concerning for acute cholecystitis further evaluation with ultrasound recommended 2 bilateral external iliac and gastrohepatic adenopathy three 5.0 x 4.0 cm soft tissue mass in the superficial soft tissues of the anterior medial right upper thigh may represent an abnormal lymph node or soft tissue malignancy.  Correlation with history of lymphoma is recommended.  Further evaluation with MRI or ultrasound with tissue sampling if clinically indicated.  Normal appendix.    Review of systems:    In addition to the HPI above,  No Fever-chills, No Headache, No changes with Vision or hearing, No problems swallowing food or Liquids, No Chest pain, Cough or Shortness of Breath, No  Nausea or Vomiting, bowel movements are regular, No Blood in stool or Urine, No dysuria, No new skin rashes or bruises, No new joints pains-aches,  No new weakness, tingling, numbness in any extremity, No recent weight gain or loss, No polyuria, polydypsia or polyphagia, No significant Mental Stressors.  All other systems reviewed and are negative.    Past History of the following :    Past Medical History:  Diagnosis Date   Asthma    Coronary atherosclerosis of native coronary artery    BMS to mid circumflex 11/2011, LVEF 55-60%   Essential hypertension, benign    Migraine    Mixed hyperlipidemia    STEMI (ST elevation myocardial infarction) ([place])    Complicated by VF arrest 11/2011      Past Surgical History:  Procedure Laterality Date   BACK SURGERY     LEFT HEART CATHETERIZATION WITH CORONARY ANGIOGRAM N/A 11/21/2011   Procedure: LEFT HEART CATHETERIZATION WITH CORONARY ANGIOGRAM;  Surgeon: Peter M Martinique, MD;  Location: [facility] CATH LAB;  Service: Cardiovascular;  Laterality: N/A;   PERCUTANEOUS CORONARY STENT INTERVENTION (PCI-S) N/A 11/21/2011  Procedure: PERCUTANEOUS CORONARY STENT INTERVENTION (PCI-S);  Surgeon: Peter M Martinique, MD;  Location: [facility] CATH LAB;  Service: Cardiovascular;  Laterality: N/A;   SPLENECTOMY        Social History:      Social History   Tobacco Use   Smoking status: Current Every Day Smoker    Packs/day: 1.00    Types: Cigarettes   Smokeless tobacco: Never Used  Substance Use Topics   Alcohol use: Yes    Comment: Occasionally       Family History :     Family History  Problem Relation Age of Onset   Malignant hyperthermia Mother    Malignant hyperthermia Brother    Heart disease Brother    Heart disease Father       Home Medications:   Prior to Admission medications   Medication Sig Start Date End Date Taking? Authorizing Provider  albuterol (PROVENTIL HFA;VENTOLIN HFA) 108 (90 Base) MCG/ACT inhaler Inhale  every 6 (six) hours as needed into the lungs for wheezing or shortness of breath.    [provider]  nitroGLYCERIN (NITROSTAT) 0.4 MG SL tablet Place 1 tablet (0.4 mg total) under the tongue every 5 (five) minutes as needed for chest pain. 04/27/17   Satira Sark, MD     Allergies:     Allergies  Allergen Reactions   Penicillins Anaphylaxis     Physical Exam:   Vitals  Blood pressure 129/86, pulse 88, temperature 98 F (36.7 C), temperature source Oral, resp. rate 15, height 5\' 11"  (1.803 m), weight 117.9 kg, SpO2 96 %.  1.  General: Lying supine in bed at the time of my exam.  Observed ambulating around the ER prior to my exam.  2. Psychiatric: Pleasant alert and oriented x3 cooperative  3. Neurologic: Cranial nerves II through XII are grossly intact no focal deficits on exam  4. HEENMT:  Head is atraumatic normocephalic pupils are reactive to light neck shows no masses trachea is midline  5. Respiratory : Lungs are clear to auscultation bilaterally  6. Cardiovascular : H RRR  7. Gastrointestinal:  Abdomen is soft nondistended there is no involuntary guarding.  Patient does have voluntary guarding with palpation to the right upper quadrant and to the periumbilical region.  Murphy sign positive.  Rovsing sign negative.  8. Skin:  No lesions or rashes on limited skin exam  9.Musculoskeletal:  No peripheral edema    Data Review:    CBC Recent Labs  Lab 06/14/19 1936  WBC 15.7*  HGB 16.6  HCT 51.1  PLT 357  MCV 99.0  MCH 32.2  MCHC 32.5  RDW 14.4   ------------------------------------------------------------------------------------------------------------------  Results for orders placed or performed during the hospital encounter of 06/14/19 (from the past 48 hour(s))  Urinalysis, Routine w reflex microscopic     Status: Abnormal   Collection Time: 06/14/19  6:00 PM  Result Value Ref Range   Color, Urine YELLOW YELLOW   APPearance  CLEAR CLEAR   Specific Gravity, Urine 1.025 1.005 - 1.030   pH 6.0 5.0 - 8.0   Glucose, UA NEGATIVE NEGATIVE mg/dL   Hgb urine dipstick NEGATIVE NEGATIVE   Bilirubin Urine NEGATIVE NEGATIVE   Ketones, ur 20 (A) NEGATIVE mg/dL   Protein, ur NEGATIVE NEGATIVE mg/dL   Nitrite NEGATIVE NEGATIVE   Leukocytes,Ua NEGATIVE NEGATIVE    Comment: Performed at [facility], [address], [place], [place] [address]  Lipase, blood     Status: None   Collection Time:  06/14/19  7:36 PM  Result Value Ref Range   Lipase 21 11 - 51 U/L    Comment: Performed at [facility], [address], [place], [place] [address]  Comprehensive metabolic panel     Status: Abnormal   Collection Time: 06/14/19  7:36 PM  Result Value Ref Range   Sodium 140 135 - 145 mmol/L   Potassium 4.2 3.5 - 5.1 mmol/L   Chloride 107 98 - 111 mmol/L   CO2 24 22 - 32 mmol/L   Glucose, Bld 126 (H) 70 - 99 mg/dL   BUN 10 6 - 20 mg/dL   Creatinine, Ser 1.03 0.61 - 1.24 mg/dL   Calcium 9.9 8.9 - 10.3 mg/dL   Total Protein 7.7 6.5 - 8.1 g/dL   Albumin 4.5 3.5 - 5.0 g/dL   AST 26 15 - 41 U/L   ALT 43 0 - 44 U/L   Alkaline Phosphatase 120 38 - 126 U/L   Total Bilirubin 0.3 0.3 - 1.2 mg/dL   GFR calc non Af Amer >60 >60 mL/min   GFR calc Af Amer >60 >60 mL/min   Anion gap 9 5 - 15    Comment: Performed at [facility], [address], [place], [place] [address]  CBC     Status: Abnormal   Collection Time: 06/14/19  7:36 PM  Result Value Ref Range   WBC 15.7 (H) 4.0 - 10.5 K/uL   RBC 5.16 4.22 - 5.81 MIL/uL   Hemoglobin 16.6 13.0 - 17.0 g/dL   HCT 51.1 39.0 - 52.0 %   MCV 99.0 80.0 - 100.0 fL   MCH 32.2 26.0 - 34.0 pg   MCHC 32.5 30.0 - 36.0 g/dL   RDW 14.4 11.5 - 15.5 %   Platelets 357 150 - 400 K/uL   nRBC 0.0 0.0 - 0.2 %    Comment: Performed at [facility], [address], [place],  [address]    Chemistries  Recent Labs  Lab 06/14/19 1936  NA 140  K 4.2  CL 107  CO2 24  GLUCOSE 126*  BUN 10  CREATININE  1.03  CALCIUM 9.9  AST 26  ALT 43  ALKPHOS 120  BILITOT 0.3   ------------------------------------------------------------------------------------------------------------------  ------------------------------------------------------------------------------------------------------------------ GFR: Estimated Creatinine Clearance: 129.4 mL/min (by C-G formula based on SCr of 1.03 mg/dL). Liver Function Tests: Recent Labs  Lab 06/14/19 1936  AST 26  ALT 43  ALKPHOS 120  BILITOT 0.3  PROT 7.7  ALBUMIN 4.5   Recent Labs  Lab 06/14/19 1936  LIPASE 21   No results for input(s): AMMONIA in the last 168 hours. Coagulation Profile: No results for input(s): INR, PROTIME in the last 168 hours. Cardiac Enzymes: No results for input(s): CKTOTAL, CKMB, CKMBINDEX, TROPONINI in the last 168 hours. BNP (last 3 results) No results for input(s): PROBNP in the last 8760 hours. HbA1C: No results for input(s): HGBA1C in the last 72 hours. CBG: No results for input(s): GLUCAP in the last 168 hours. Lipid Profile: No results for input(s): CHOL, HDL, LDLCALC, TRIG, CHOLHDL, LDLDIRECT in the last 72 hours. Thyroid Function Tests: No results for input(s): TSH, T4TOTAL, FREET4, T3FREE, THYROIDAB in the last 72 hours. Anemia Panel: No results for input(s): VITAMINB12, FOLATE, FERRITIN, TIBC, IRON, RETICCTPCT in the last 72 hours.  --------------------------------------------------------------------------------------------------------------- Urine analysis:    Component Value Date/Time   COLORURINE YELLOW 06/14/2019 1800   APPEARANCEUR CLEAR 06/14/2019 1800   LABSPEC 1.025 06/14/2019 1800   PHURINE 6.0 06/14/2019 1800  GLUCOSEU NEGATIVE 06/14/2019 1800   HGBUR NEGATIVE 06/14/2019 1800   BILIRUBINUR NEGATIVE 06/14/2019 1800   KETONESUR 20 (A) 06/14/2019 1800   PROTEINUR NEGATIVE 06/14/2019 1800   UROBILINOGEN 1.0 11/21/2011 1125   NITRITE NEGATIVE 06/14/2019 1800   LEUKOCYTESUR NEGATIVE  06/14/2019 1800      Imaging Results:    Ct Abdomen Pelvis W Contrast  Result Date: 06/15/2019 CLINICAL DATA:  38 year old male with intermittent abdominal pain radiating to the back. EXAM: CT ABDOMEN AND PELVIS WITH CONTRAST TECHNIQUE: Multidetector CT imaging of the abdomen and pelvis was performed using the standard protocol following bolus administration of intravenous contrast. CONTRAST:  157mL OMNIPAQUE IOHEXOL 300 MG/ML  SOLN COMPARISON:  None. FINDINGS: Lower chest: Minimal bibasilar atelectatic changes. The visualized lung bases are otherwise clear. No intra-abdominal free air or free fluid. Hepatobiliary: Apparent mild fatty infiltration of the liver. No intrahepatic biliary ductal dilatation. There are multiple stones within the gallbladder. The gallbladder wall appears thickened and edematous concerning for acute cholecystitis. Further evaluation with ultrasound is recommended. Pancreas: Unremarkable. No pancreatic ductal dilatation or surrounding inflammatory changes. Spleen: Splenectomy. Adrenals/Urinary Tract: The adrenal glands are unremarkable. There is no hydronephrosis on either side. The visualized ureters appear unremarkable. The urinary bladder is collapsed. Stomach/Bowel: There is no bowel obstruction or active inflammation. The appendix is normal. Vascular/Lymphatic: The abdominal aorta and IVC are unremarkable. No portal venous gas. Multiple mildly enlarged or top-normal upper abdominal lymph node measure up to 13 mm in short axis in the gastrohepatic space. Several scattered small nodular density upper omentum (series 2, image 23) measure up to 7 mm in short axis. Bilateral external iliac adenopathy measure up to 3.4 cm on the right side. There is a 5.0 x 4.0 cm soft tissue mass in the superficial soft tissues of the anteromedial right upper thigh which may represent an abnormal lymph node or a soft tissue malignancy. Further evaluation with MRI or ultrasound with tissue sampling  recommended. Reproductive: The prostate and seminal vesicles are grossly unremarkable. Other: None Musculoskeletal: L4-L5 posterior fusion and disc spacer. No acute osseous pathology. IMPRESSION: 1. Cholelithiasis with findings concerning for acute cholecystitis. Further evaluation with ultrasound is recommended. 2. Bilateral external iliac and gastrohepatic adenopathy. 3. A 5.0 x 4.0 cm soft tissue mass in the superficial soft tissues of the anteromedial right upper thigh may represent an abnormal lymph node or a soft tissue malignancy. Correlation with history of lymphoma is recommended. Further evaluation with MRI or ultrasound with tissue sampling, if clinically indicated, recommended. 4. No bowel obstruction or active inflammation. Normal appendix. Electronically Signed   By: Anner Crete M.D.   On: 06/15/2019 00:43    My personal review of EKG: Rhythm NSR, Rate 92/min, QTc 461,no Acute ST changes   Assessment & Plan:    Active Problems:   Cholecystitis   1. Cholecystitis 1. CT findings consistent with cholecystitis 2. .Ultrasound ordered for morning 3. Morphine for pain 4. N.p.o. except for sips and meds 5. Normal saline maintenance fluids 6. Zofran for nausea 7. General surgery consulted 8. Patient is allergic to penicillin-continue Cipro 2. Leukocytosis 1. Likely secondary to #1 3. Coronary artery disease 1. History of MI in his 47s 2. Patient reports being on statin at home 3. Continue statin 4. Patient denies being on any other core measures 5. Med rec with retail pharmacy may be warranted 4. Hyperglycemia 1. Continue to monitor with daily BMP 2. If hyperglycemia persists check hemoglobin A1c and add sliding scale 5. Tobacco abuse  1. Discussed cessation and potential complications of continued smoking 2. Patient is pre-contemplative 3. Patient denies nicotine patch at this time 6.    DVT Prophylaxis-   Lovenox - SCDs   AM Labs Ordered, also please review Full  Orders  Family Communication: Admission, patients condition and plan of care including tests being ordered have been discussed with the patient who verbalizes understanding and agrees with the plan and Code Status.  Code Status: Full  Admission status: Inpatient: Based on patients clinical presentation and evaluation of above clinical data, I have made determination that patient meets Inpatient criteria at this time.  Time spent in minutes : 60   Rolla Plate M.D on 06/15/2019 at 4:04 AM

## 2019-06-16 LAB — HIV ANTIBODY (ROUTINE TESTING W REFLEX): HIV Screen 4th Generation wRfx: NONREACTIVE

## 2019-06-27 ENCOUNTER — Inpatient Hospital Stay (HOSPITAL_COMMUNITY): Payer: Medicaid Other | Attending: Hematology | Admitting: Hematology

## 2019-06-27 ENCOUNTER — Other Ambulatory Visit: Payer: Self-pay

## 2019-06-27 ENCOUNTER — Encounter (HOSPITAL_COMMUNITY): Payer: Self-pay | Admitting: Surgery

## 2019-06-27 ENCOUNTER — Encounter (HOSPITAL_COMMUNITY): Payer: Self-pay | Admitting: Hematology

## 2019-06-27 ENCOUNTER — Inpatient Hospital Stay (HOSPITAL_COMMUNITY): Payer: Medicaid Other

## 2019-06-27 VITALS — BP 122/84 | HR 90 | Temp 98.0°F | Resp 16 | Ht 71.0 in | Wt 268.7 lb

## 2019-06-27 DIAGNOSIS — R590 Localized enlarged lymph nodes: Secondary | ICD-10-CM | POA: Insufficient documentation

## 2019-06-27 DIAGNOSIS — R591 Generalized enlarged lymph nodes: Secondary | ICD-10-CM | POA: Diagnosis not present

## 2019-06-27 DIAGNOSIS — R599 Enlarged lymph nodes, unspecified: Secondary | ICD-10-CM | POA: Insufficient documentation

## 2019-06-27 LAB — LACTATE DEHYDROGENASE: LDH: 113 U/L (ref 98–192)

## 2019-06-27 NOTE — Progress Notes (Signed)
[facility] NOTE  Patient Care Team: [place], Collene Mares, PA-C as PCP - General (Physician Assistant) Satira Sark, MD as Consulting Physician (Cardiology)  CHIEF COMPLAINTS/PURPOSE OF CONSULTATION:  Generalized lymphadenopathy.  HISTORY OF PRESENTING ILLNESS:  Nathan Waters 38 y.o. male is seen in consultation today at the request of Dr. Manuella Ghazi for generalized lymphadenopathy.  He was recently hospitalized with abdominal pain and was diagnosed with cholecystitis.  A CT scan of the abdomen and pelvis on 06/15/2019 showed a 5 x 4 cm soft tissue mass in the superficial soft tissues of the anteromedial right upper thigh, questionable for abnormal lymph nodes/soft tissue malignancy.  He also had bilateral external iliac and gastrohepatic adenopathy.  He denied any fevers, night sweats or weight loss in the last 6 months.  Review of his labs revealed a leukocytosis since 2011, predominantly neutrophils and monocytes.  He reportedly underwent splenectomy for enlarged spleen around age 39 and 54.  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  He had back surgery secondary to L4-L5 disc problems.  He does have asthma and uses inhaler as needed.  Denies any nausea, vomiting or diarrhea or constipation.  Denies any bleeding per rectum or melena.  No change in bowel habits noted.  Patient currently is not working.  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  He is a current active smoker, smokes 1 pack/day for past 24 years.  He rarely drinks alcohol.  Family history significant for father who had cancer right before his death.  Patient does not know the type.  Mother had throat cancer.  Paternal grandfather had stomach cancer.  Appetite and energy levels are reported as 100%.  Patient lives at home with his mother.  He has 2 children.  MEDICAL HISTORY:  Past Medical History:  Diagnosis Date  . Asthma   . Coronary atherosclerosis of native coronary artery    BMS  to mid circumflex 11/2011, LVEF 55-60%  . Essential hypertension, benign   . Migraine   . Mixed hyperlipidemia   . STEMI (ST elevation myocardial infarction) ([facility])    Complicated by VF arrest 11/2011    SURGICAL HISTORY: Past Surgical History:  Procedure Laterality Date  . BACK SURGERY    . CARDIAC CATHETERIZATION    . LEFT HEART CATHETERIZATION WITH CORONARY ANGIOGRAM N/A 11/21/2011   Procedure: LEFT HEART CATHETERIZATION WITH CORONARY ANGIOGRAM;  Surgeon: Peter M Martinique, MD;  Location[facility] CATH LAB;  Service: Cardiovascular;  Laterality: N/A;  . PERCUTANEOUS CORONARY STENT INTERVENTION (PCI-S) N/A 11/21/2011   Procedure: PERCUTANEOUS CORONARY STENT INTERVENTION (PCI-S);  Surgeon: Peter M Martinique, MD;  Location: [facility] CATH LAB;  Service: Cardiovascular;  Laterality: N/A;  . SPLENECTOMY      SOCIAL HISTORY: Social History   Socioeconomic History  . Marital status: Divorced    Spouse name: Not on file  . Number of children: 2  . Years of education: Not on file  . Highest education level: Not on file  Occupational History  . 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 Financial resource strain: Not hard at all  . Food insecurity    Worry: Never true    Inability: Never true  . Transportation needs    Medical: No    Non-medical: No  Tobacco Use  . Smoking status: Current Every Day Smoker    Packs/day: 1.00    Types: Cigarettes  . Smokeless tobacco: Never Used  Substance and  Sexual Activity  . Alcohol use: Yes    Comment: Occasionally  . Drug use: No  . Sexual activity: Yes    Birth control/protection: Rhythm  Lifestyle  . Physical activity    Days per week: 3 days    Minutes per session: 30 min  . Stress: Only a little  Relationships  . Social connections    Talks on phone: More than three times a week    Gets together: Twice a week    Attends religious service: Never    Active member of club or organization: No    Attends meetings of clubs or organizations: Not on file     Relationship status: Divorced  . Intimate partner violence    Fear of current or ex partner: No    Emotionally abused: No    Physically abused: No    Forced sexual activity: No  Other Topics Concern  . Not on file  Social History Narrative  . Not on file    FAMILY HISTORY: Family History  Problem Relation Age of Onset  . Malignant hyperthermia Mother   . Cancer Mother   . Malignant hyperthermia Brother   . Heart disease Brother   . Asthma Brother   . COPD Brother   . Heart disease Father   . Cancer Father   . ADD / ADHD Daughter   . ADD / ADHD Son     ALLERGIES:  is allergic to penicillins.  MEDICATIONS:  Current Outpatient Medications  Medication Sig Dispense Refill  . albuterol (PROVENTIL HFA;VENTOLIN HFA) 108 (90 Base) MCG/ACT inhaler Inhale every 6 (six) hours as needed into the lungs for wheezing or shortness of breath.    . nitroGLYCERIN (NITROSTAT) 0.4 MG SL tablet Place 1 tablet (0.4 mg total) under the tongue every 5 (five) minutes as needed for chest pain. 25 tablet 3   No current facility-administered medications for this visit.     REVIEW OF SYSTEMS:   Constitutional: Denies fevers, chills or abnormal night sweats Eyes: Denies blurriness of vision, double vision or watery eyes Ears, nose, mouth, throat, and face: Denies mucositis or sore throat Respiratory: Denies cough, dyspnea or wheezes Cardiovascular: Denies palpitation, chest discomfort or lower extremity swelling Gastrointestinal:  Denies nausea, heartburn or change in bowel habits Skin: Denies abnormal skin rashes Lymphatics: Denies new lymphadenopathy or easy bruising Neurological:Denies numbness, tingling or new weaknesses Behavioral/Psych: Mood is stable, no new changes  All other systems were reviewed with the patient and are negative.  PHYSICAL EXAMINATION: ECOG PERFORMANCE STATUS: 1 - Symptomatic but completely ambulatory  Vitals:   06/27/19 1314  BP: 122/84  Pulse: 90  Resp: 16   Temp: 98 F (36.7 C)  SpO2: 96%   Filed Weights   06/27/19 1314  Weight: 268 lb 11.2 oz (121.9 kg)    GENERAL:alert, no distress and comfortable SKIN: skin color, texture, turgor are normal, no rashes or significant lesions EYES: normal, conjunctiva are pink and non-injected, sclera clear OROPHARYNX:no exudate, no erythema and lips, buccal mucosa, and tongue normal  NECK: supple, thyroid normal size, non-tender, without nodularity LYMPH:  no palpable lymphadenopathy in the cervical, axillary or inguinal LUNGS: clear to auscultation and percussion with normal breathing effort HEART: regular rate & rhythm and no murmurs and no lower extremity edema ABDOMEN:abdomen soft, non-tender and normal bowel sounds Musculoskeletal:no cyanosis of digits and no clubbing  PSYCH: alert & oriented x 3 with fluent speech NEURO: no focal motor/sensory deficits There is a 5 x 6 cm  palpable mass in the right upper medial thigh which is freely mobile.  No overlying skin changes.  LABORATORY DATA:  I have reviewed the data as listed Lab Results  Component Value Date   WBC 13.6 (H) 06/15/2019   HGB 15.7 06/15/2019   HCT 47.4 06/15/2019   MCV 99.6 06/15/2019   PLT 327 06/15/2019     Chemistry      Component Value Date/Time   NA 140 06/15/2019 0518   K 3.7 06/15/2019 0518   CL 107 06/15/2019 0518   CO2 25 06/15/2019 0518   BUN 9 06/15/2019 0518   CREATININE 0.84 06/15/2019 0518      Component Value Date/Time   CALCIUM 9.5 06/15/2019 0518   ALKPHOS 100 06/15/2019 0518   AST 26 06/15/2019 0518   ALT 38 06/15/2019 0518   BILITOT 0.7 06/15/2019 0518       RADIOGRAPHIC STUDIES: I have personally reviewed the radiological images as listed and agreed with the findings in the report. US Abdomen Complete  Result Date: 06/15/2019 CLINICAL DATA:  Nausea, vomiting and epigastric abdominal pain. History prior splenectomy. CT demonstrates cholelithiasis with possible gallbladder inflammation. EXAM:  ABDOMEN ULTRASOUND COMPLETE COMPARISON:  CT of the abdomen and pelvis earlier today. FINDINGS: Gallbladder: Multiple large shadowing gallstones are present within the gallbladder. The gallbladder wall appears likely thickened measuring 6-7 mm. No evidence of overt sonographic Murphy sign at the time of the study or pericholecystic fluid. Common bile duct: Diameter: 6 mm. Liver: The liver demonstrates coarse echotexture and increased echogenicity, likely reflecting diffuse steatosis. No overt cirrhotic contour abnormalities or focal lesions are identified. There is no evidence of intrahepatic biliary ductal dilatation. The portal vein is open. Portal vein is patent on color Doppler imaging with normal direction of blood flow towards the liver. IVC: No abnormality visualized. Pancreas: Visualized portion unremarkable. Spleen: Surgically absent. Right Kidney: Length: 13.9 cm. Echogenicity within normal limits. No mass or hydronephrosis visualized. Left Kidney: Length: 14.5 cm. Echogenicity within normal limits. No mass or hydronephrosis visualized. Abdominal aorta: No aneurysm visualized. Other findings: No ascites or focal fluid collections identified by ultrasound. IMPRESSION: 1. Cholelithiasis with large gallstones in the gallbladder lumen. The gallbladder wall is thickened. No evidence of biliary ductal dilatation. 2. Hepatic steatosis. Electronically Signed   By: Aletta Edouard M.D.   On: 06/15/2019 09:59   Ct Abdomen Pelvis W Contrast  Result Date: 06/15/2019 CLINICAL DATA:  38 year old male with intermittent abdominal pain radiating to the back. EXAM: CT ABDOMEN AND PELVIS WITH CONTRAST TECHNIQUE: Multidetector CT imaging of the abdomen and pelvis was performed using the standard protocol following bolus administration of intravenous contrast. CONTRAST:  153mL OMNIPAQUE IOHEXOL 300 MG/ML  SOLN COMPARISON:  None. FINDINGS: Lower chest: Minimal bibasilar atelectatic changes. The visualized lung bases are  otherwise clear. No intra-abdominal free air or free fluid. Hepatobiliary: Apparent mild fatty infiltration of the liver. No intrahepatic biliary ductal dilatation. There are multiple stones within the gallbladder. The gallbladder wall appears thickened and edematous concerning for acute cholecystitis. Further evaluation with ultrasound is recommended. Pancreas: Unremarkable. No pancreatic ductal dilatation or surrounding inflammatory changes. Spleen: Splenectomy. Adrenals/Urinary Tract: The adrenal glands are unremarkable. There is no hydronephrosis on either side. The visualized ureters appear unremarkable. The urinary bladder is collapsed. Stomach/Bowel: There is no bowel obstruction or active inflammation. The appendix is normal. Vascular/Lymphatic: The abdominal aorta and IVC are unremarkable. No portal venous gas. Multiple mildly enlarged or top-normal upper abdominal lymph node measure up to 13 mm  in short axis in the gastrohepatic space. Several scattered small nodular density upper omentum (series 2, image 23) measure up to 7 mm in short axis. Bilateral external iliac adenopathy measure up to 3.4 cm on the right side. There is a 5.0 x 4.0 cm soft tissue mass in the superficial soft tissues of the anteromedial right upper thigh which may represent an abnormal lymph node or a soft tissue malignancy. Further evaluation with MRI or ultrasound with tissue sampling recommended. Reproductive: The prostate and seminal vesicles are grossly unremarkable. Other: None Musculoskeletal: L4-L5 posterior fusion and disc spacer. No acute osseous pathology. IMPRESSION: 1. Cholelithiasis with findings concerning for acute cholecystitis. Further evaluation with ultrasound is recommended. 2. Bilateral external iliac and gastrohepatic adenopathy. 3. A 5.0 x 4.0 cm soft tissue mass in the superficial soft tissues of the anteromedial right upper thigh may represent an abnormal lymph node or a soft tissue malignancy. Correlation  with history of lymphoma is recommended. Further evaluation with MRI or ultrasound with tissue sampling, if clinically indicated, recommended. 4. No bowel obstruction or active inflammation. Normal appendix. Electronically Signed   By: Anner Crete M.D.   On: 06/15/2019 00:43    ASSESSMENT & PLAN:  Lymphadenopathy 1.  Pelvic lymphadenopathy: - He had a recent hospital admission with cholecystitis. -CTAP on 06/15/2019 showed incidental bilateral external iliac adenopathy measuring up to 3.4 cm on the right side.  There is a 5 x 4 cm soft tissue mass in the superficial soft tissues of the anteromedial right upper thigh.  Multiple mildly enlarged upper abdominal lymph nodes measuring up to 13 mm in short axis in the gastrohepatic space.  Several scattered small nodular density in the upper omentum measure up to 7 mm in short axis. - He denies any fevers, night sweats or weight loss in the last 6 months. 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 -Review of labs shows his white count was elevated since 2011, predominantly neutrophils and monocytes. -Physical examination today did not reveal any palpable adenopathy in the axillary region or neck region.  Palpable mass in the right upper medial thigh present. - I have recommended an LDH level, beta-2 microglobulin and hepatitis panel. -I have also recommended a PET CT scan which will aid in identifying the right area for biopsy. -We will also make referral to Drs. Jenkins/Bridges for biopsy.  2.  Tobacco abuse: -He is current active smoker, smokes 1 pack/day for 24 years.  3.  Family history: -Father was diagnosed with metastatic cancer right before his death.  Mother had throat cancer.  Paternal grandfather had stomach cancer.  Orders Placed This Encounter  Procedures  . NM PET Image Initial (PI) Skull Base To Thigh    Standing Status:   Future    Standing Expiration Date:   06/26/2020    Order  Specific Question:   ** REASON FOR EXAM (FREE TEXT)    Answer:   lymphadenopathy    Order Specific Question:   If indicated for the ordered procedure, I authorize the administration of a radiopharmaceutical per Radiology protocol    Answer:   Yes    Order Specific Question:   Preferred imaging location?    Answer:   [facility]    Order Specific Question:   Radiology Contrast Protocol - do NOT remove file path    Answer:   [email or web address]  . Lactate dehydrogenase    Standing Status:   Future  Number of Occurrences:   1    Standing Expiration Date:   06/26/2020  . Hepatitis B surface antigen    Standing Status:   Future    Number of Occurrences:   1    Standing Expiration Date:   06/26/2020  . Hepatitis B surface antibody    Standing Status:   Future    Number of Occurrences:   1    Standing Expiration Date:   06/26/2020  . Hepatitis B core antibody, total    Standing Status:   Future    Number of Occurrences:   1    Standing Expiration Date:   06/26/2020  . Beta 2 microglobuline, serum    Standing Status:   Future    Number of Occurrences:   1    Standing Expiration Date:   06/26/2020    All questions were answered. The patient knows to call the clinic with any problems, questions or concerns.      Derek Jack, MD 06/27/2019 5:51 PM

## 2019-06-27 NOTE — Patient Instructions (Signed)
[place] at [facility] Discharge Instructions  You were seen today by Dr. Delton Coombes. He went over your history, family history and how you've been feeling lately. He will have blood drawn today. He will also schedule you for a PET scan to help evaluate your lymphadenopathy.  He will see you back after your scan for follow up.   Thank you for choosing [place] at [facility] to provide your oncology and hematology care.  To afford each patient quality time with our provider, please arrive at least 15 minutes before your scheduled appointment time.   If you have a lab appointment with the [place] please come in thru the  Main Entrance and check in at the main information desk  You need to re-schedule your appointment should you arrive 10 or more minutes late.  We strive to give you quality time with our providers, and arriving late affects you and other patients whose appointments are after yours.  Also, if you no show three or more times for appointments you may be dismissed from the clinic at the providers discretion.     Again, thank you for choosing [facility].  Our hope is that these requests will decrease the amount of time that you wait before being seen by our physicians.       _____________________________________________________________  Should you have questions after your visit to [facility], please contact our office at [phone] between the hours of 8:00 a.m. and 4:30 p.m.  Voicemails left after 4:00 p.m. will not be returned until the following business day.  For prescription refill requests, have your pharmacy contact our office and allow 72 hours.    Cancer Center Support Programs:   > Cancer Support Group  2nd Tuesday of the month 1pm-2pm, Journey Room

## 2019-06-27 NOTE — Assessment & Plan Note (Addendum)
1.  Pelvic lymphadenopathy: - He had a recent hospital admission with cholecystitis. -CTAP on 06/15/2019 showed incidental bilateral external iliac adenopathy measuring up to 3.4 cm on the right side.  There is a 5 x 4 cm soft tissue mass in the superficial soft tissues of the anteromedial right upper thigh.  Multiple mildly enlarged upper abdominal lymph nodes measuring up to 13 mm in short axis in the gastrohepatic space.  Several scattered small nodular density in the upper omentum measure up to 7 mm in short axis. - He denies any fevers, night sweats or weight loss in the last 6 months. -He reportedly had a splenectomy for enlargement of spleen around 25 to 38 years of age. -Review of labs shows his white count was elevated since 2011, predominantly neutrophils and monocytes. -Physical examination today did not reveal any palpable adenopathy in the axillary region or neck region.  Palpable mass in the right upper medial thigh present. - I have recommended an LDH level, beta-2 microglobulin and hepatitis panel. -I have also recommended a PET CT scan which will aid in identifying the right area for biopsy. -We will also make referral to Drs. Jenkins/Bridges for biopsy.  2.  Tobacco abuse: -He is current active smoker, smokes 1 pack/day for 24 years.  3.  Family history: -Father was diagnosed with metastatic cancer right before his death.  Mother had throat cancer.  Paternal grandfather had stomach cancer.  4.  Leukocytosis: -He has predominantly neutrophilic leukocytosis since 2011. 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

## 2019-06-28 ENCOUNTER — Other Ambulatory Visit (HOSPITAL_COMMUNITY): Payer: Self-pay | Admitting: *Deleted

## 2019-06-28 DIAGNOSIS — R591 Generalized enlarged lymph nodes: Secondary | ICD-10-CM

## 2019-06-28 LAB — HEPATITIS B SURFACE ANTIBODY,QUALITATIVE: Hep B S Ab: NONREACTIVE

## 2019-06-28 LAB — BETA 2 MICROGLOBULIN, SERUM: Beta-2 Microglobulin: 1.8 mg/L (ref 0.6–2.4)

## 2019-06-28 LAB — HEPATITIS B CORE ANTIBODY, TOTAL: Hep B Core Total Ab: NEGATIVE

## 2019-06-28 LAB — HEPATITIS B SURFACE ANTIGEN: Hepatitis B Surface Ag: NEGATIVE

## 2019-07-01 ENCOUNTER — Ambulatory Visit (HOSPITAL_COMMUNITY): Payer: Medicaid Other

## 2019-07-02 ENCOUNTER — Other Ambulatory Visit (HOSPITAL_COMMUNITY): Payer: Self-pay | Admitting: Hematology

## 2019-07-02 ENCOUNTER — Other Ambulatory Visit (HOSPITAL_COMMUNITY): Payer: Self-pay | Admitting: Nurse Practitioner

## 2019-07-02 DIAGNOSIS — R591 Generalized enlarged lymph nodes: Secondary | ICD-10-CM

## 2019-07-03 ENCOUNTER — Ambulatory Visit (HOSPITAL_COMMUNITY): Payer: Medicaid Other | Admitting: Hematology

## 2019-07-09 ENCOUNTER — Other Ambulatory Visit: Payer: Self-pay

## 2019-07-09 ENCOUNTER — Ambulatory Visit (HOSPITAL_COMMUNITY)
Admission: RE | Admit: 2019-07-09 | Discharge: 2019-07-09 | Disposition: A | Discharge status: Home / Self Care | Payer: Medicaid Other | Source: Ambulatory Visit | Attending: Hematology | Admitting: Hematology

## 2019-07-09 DIAGNOSIS — R591 Generalized enlarged lymph nodes: Secondary | ICD-10-CM | POA: Diagnosis present

## 2019-07-09 MED ORDER — IOHEXOL 300 MG/ML  SOLN: 100.0000 mL | Freq: Once | INTRAMUSCULAR | Status: DC | PRN

## 2019-07-09 MED ORDER — IOHEXOL 300 MG/ML  SOLN
75.0000 mL | Freq: Once | INTRAMUSCULAR | Status: AC | PRN
  Administered 2019-07-09: 75 mL via INTRAVENOUS

## 2019-07-11 ENCOUNTER — Other Ambulatory Visit (HOSPITAL_COMMUNITY): Payer: Self-pay | Admitting: Nurse Practitioner

## 2019-07-23 ENCOUNTER — Encounter: Payer: Self-pay | Admitting: General Surgery

## 2019-07-23 ENCOUNTER — Other Ambulatory Visit: Payer: Self-pay

## 2019-07-23 ENCOUNTER — Ambulatory Visit (INDEPENDENT_AMBULATORY_CARE_PROVIDER_SITE_OTHER): Payer: Medicaid Other | Admitting: General Surgery

## 2019-07-23 VITALS — BP 126/86 | HR 84 | Temp 98.4°F | Resp 16 | Ht 71.0 in | Wt 270.0 lb

## 2019-07-23 DIAGNOSIS — R591 Generalized enlarged lymph nodes: Secondary | ICD-10-CM

## 2019-07-23 NOTE — Patient Instructions (Signed)
Open Lymph Node Biopsy An open lymph node biopsy is a procedure to remove a lymph node so that it can be examined under a microscope. Lymph nodes are part of the body's disease-fighting system (immune system). The immune system protects the body from infections, germs, and diseases. An open lymph node biopsy may be done to:  Look for germs or cancer cells in your lymph node.  Find out why your lymph node is swollen.  Find out more about a condition you have. Lymph nodes are found in many locations in the body. Biopsies are often done on lymph nodes in the head, neck, armpit, or groin. Tell a health care provider about:  Any allergies you have.  All medicines you are taking, including vitamins, herbs, eye drops, creams, and over-the-counter medicines.  Any problems you or family members have had with anesthetic medicines.  Any blood disorders you have.  Any surgeries you have had.  Any medical conditions you have or have had.  Whether you are pregnant or may be pregnant. What are the risks? Generally, this is a safe procedure. However, problems may occur, including:  Infection.  Bleeding.  Allergic reactions to medicines.  Damage to other structures or organs, such as a nerve.  Scarring. What happens before the procedure? Medicines Ask your health care provider about:  Changing or stopping your regular medicines. This is especially important if you are taking diabetes medicines or blood thinners.  Taking medicines such as aspirin and ibuprofen. These medicines can thin your blood. Do not take these medicines unless your health care provider tells you to take them.  Taking over-the-counter medicines, vitamins, herbs, and supplements. General instructions  Follow instructions from your health care provider about eating or drinking restrictions.  You may have an exam or testing.  You may have a blood or urine sample taken.  Plan to have someone take you home from the  hospital or clinic.  If you will be going home right after the procedure, plan to have someone with you for 24 hours.  Ask your health care provider how your surgical site will be marked or identified.  Ask your health care provider what steps will be taken to help prevent infection. These may include: ? Removing hair at the biopsy site. ? Washing skin with a germ-killing soap. ? Taking antibiotic medicine. What happens during the procedure?   An IV will be inserted into one of your veins.  You will be given one or more of the following: ? A medicine to help you relax (sedative). ? A medicine to numb the area (local anesthetic).  An incision will be made in the area where your lymph node is located.  Your lymph node will be removed.  Your incision will be closed with stitches (sutures).  An antibiotic ointment may be applied to your incision.  A bandage (dressing) will be placed over your incision. The procedure may vary among health care providers and hospitals. What happens after the procedure?  Your blood pressure, heart rate, breathing rate, and blood oxygen level will be monitored until you leave the hospital or clinic.  Do not drive for 24 hours if you were given a sedative during your procedure.  It is up to you to get the results of your procedure. Ask your health care provider, or the department that is doing the procedure, when your results will be ready. Summary  An open lymph node biopsy is a procedure to remove a lymph node so that   it can be checked for infections, germs, and disease.  Generally, this is a safe procedure. However, problems may occur, including bleeding, infection, allergic reaction to medicines, and damage to other structures or organs.  Follow your health care provider's instructions before the procedure. These may include changing or stopping some medicines and restricting what you eat and drink.  During the procedure, an incision will be  made in the area of the lymph node, the lymph node will be removed, and the incision will be closed with sutures.  You will be monitored after the procedure. Do not drive for 24 hours if you were given a sedative during your procedure. This information is not intended to replace advice given to you by your health care provider. Make sure you discuss any questions you have with your health care provider. Document Released: 10/30/2015 Document Revised: 05/10/2018 Document Reviewed: 05/10/2018 Elsevier Patient Education  2020 [employer] Inc[employer]  

## 2019-07-24 ENCOUNTER — Encounter (HOSPITAL_COMMUNITY): Payer: Self-pay

## 2019-07-24 ENCOUNTER — Other Ambulatory Visit (HOSPITAL_COMMUNITY)
Admission: RE | Admit: 2019-07-24 | Discharge: 2019-07-24 | Disposition: A | Discharge status: Home / Self Care | Payer: Medicaid Other | Source: Ambulatory Visit | Attending: General Surgery | Admitting: General Surgery

## 2019-07-24 ENCOUNTER — Encounter (HOSPITAL_COMMUNITY)
Admission: RE | Admit: 2019-07-24 | Discharge: 2019-07-24 | Disposition: A | Discharge status: Home / Self Care | Payer: Medicaid Other | Source: Ambulatory Visit | Attending: General Surgery | Admitting: General Surgery

## 2019-07-24 ENCOUNTER — Other Ambulatory Visit: Payer: Self-pay

## 2019-07-24 DIAGNOSIS — Z01812 Encounter for preprocedural laboratory examination: Secondary | ICD-10-CM | POA: Diagnosis present

## 2019-07-24 DIAGNOSIS — Z20828 Contact with and (suspected) exposure to other viral communicable diseases: Secondary | ICD-10-CM | POA: Insufficient documentation

## 2019-07-24 LAB — SARS CORONAVIRUS 2 (TAT 6-24 HRS): SARS Coronavirus 2: NEGATIVE

## 2019-07-24 NOTE — Progress Notes (Signed)
Subjective:     Nathan Waters  Here for follow-up visit.  Patient has had no right upper quadrant abdominal pain, nausea, vomiting, fatty food intolerance.  No fever or chills have been noted.  He does have lymphadenopathy and is in need of a biopsy to rule out lymphoma. Objective:    BP 126/86 (BP Location: Left Arm, Patient Position: Sitting, Cuff Size: Normal)   Pulse 84   Temp 98.4 F (36.9 C) (Tympanic)   Resp 16   Ht 5\' 11"  (1.803 m)   Wt 270 lb (122.5 kg)   SpO2 94%   BMI 37.66 kg/m   General:  alert, cooperative and no distress  Abdomen is soft, nontender, nondistended.  Bowel sounds are active. Left axillary examination reveals shotty lymphadenopathy.   CT scan results reviewed  Assessment:    Generalized lymphadenopathy, rule out lymphoma   No biliary colic symptoms at this time   Plan:   As patient has had no biliary colic, will defer cholecystectomy at this time.  Patient is scheduled for a left axillary lymph node biopsy on 07/26/2019.  The risks and benefits of the procedure were fully explained to the patient, who gave informed consent.

## 2019-07-24 NOTE — H&P (Signed)
Nathan Waters is an 38 y.o. male.   Chief Complaint: Generalized lymphadenopathy HPI: Patient is a 38 year old white male who presents for a left axillary lymph node biopsy.  Patient was found on work-up of abdominal pain to have generalized lymphadenopathy in the chest, abdomen, and axillary regions.  The concern is for possible lymphoma.  Patient denies any fever or chills.  He has 0 abdominal pain.  Past Medical History:  Diagnosis Date  . Asthma   . Coronary atherosclerosis of native coronary artery    BMS to mid circumflex 11/2011, LVEF 55-60%  . Essential hypertension, benign   . Migraine   . Mixed hyperlipidemia   . STEMI (ST elevation myocardial infarction) ([place])    Complicated by VF arrest 11/2011    Past Surgical History:  Procedure Laterality Date  . BACK SURGERY    . CARDIAC CATHETERIZATION    . LEFT HEART CATHETERIZATION WITH CORONARY ANGIOGRAM N/A 11/21/2011   Procedure: LEFT HEART CATHETERIZATION WITH CORONARY ANGIOGRAM;  Surgeon: Peter M Martinique, MD;  Location: [facility] CATH LAB;  Service: Cardiovascular;  Laterality: N/A;  . PERCUTANEOUS CORONARY STENT INTERVENTION (PCI-S) N/A 11/21/2011   Procedure: PERCUTANEOUS CORONARY STENT INTERVENTION (PCI-S);  Surgeon: Peter M Martinique, MD;  Location: [facility] CATH LAB;  Service: Cardiovascular;  Laterality: N/A;  . SPLENECTOMY      Family History  Problem Relation Age of Onset  . Malignant hyperthermia Mother   . Cancer Mother   . Malignant hyperthermia Brother   . Heart disease Brother   . Asthma Brother   . COPD Brother   . Heart disease Father   . Cancer Father   . ADD / ADHD Daughter   . ADD / ADHD Son    Social History:  reports that he has been smoking cigarettes. He has been smoking about 1.00 pack per day. He has never used smokeless tobacco. He reports current alcohol use. He reports that he does not use drugs.  Allergies:  Allergies  Allergen Reactions  . Penicillins Anaphylaxis    Did it involve swelling of the  face/tongue/throat, SOB, or low BP? Yes Did it involve sudden or severe rash/hives, skin peeling, or any reaction on the inside of your mouth or nose? No Did you need to seek medical attention at a hospital or doctor's office? Yes When did it last happen?Childhood allergy If all above answers are "NO", may proceed with cephalosporin use.     No medications prior to admission.    No results found for this or any previous visit (from the past 48 hour(s)). No results found.  Review of Systems  Constitutional: Negative.   HENT: Negative.   Eyes: Negative.   Respiratory: Negative.   Cardiovascular: Negative.   Gastrointestinal: Negative.   Genitourinary: Negative.   Musculoskeletal: Negative.   Skin: Negative.   Neurological: Negative.   Endo/Heme/Allergies: Negative.   Psychiatric/Behavioral: Negative.     There were no vitals taken for this visit. Physical Exam  Vitals reviewed. Constitutional: He is oriented to person, place, and time. He appears well-developed and well-nourished. No distress.  HENT:  Head: Normocephalic and atraumatic.  Cardiovascular: Normal rate, regular rhythm and normal heart sounds. Exam reveals no gallop and no friction rub.  No murmur heard. Respiratory: Effort normal and breath sounds normal. No respiratory distress. He has no wheezes. He has no rales.  GI: Soft. Bowel sounds are normal. He exhibits no distension. There is no abdominal tenderness. There is no rebound and no guarding.  Neurological:  He is alert and oriented to person, place, and time.  Skin: Skin is warm and dry.  Shotty left axillary lymphadenopathy.  No inguinal lymphadenopathy, though a soft tissue mass is noted inferior to the groin in the right thigh.  Assessment/Plan Impression: Generalized lymphadenopathy, rule out lymphoma Plan: Patient is scheduled to undergo a left axillary lymph node biopsy on 07/26/2019.  The risks and benefits of the procedure including bleeding  and infection were fully explained to the patient, who gave informed consent.  Aviva Signs, MD 07/24/2019, 7:06 AM

## 2019-07-26 ENCOUNTER — Ambulatory Visit (HOSPITAL_COMMUNITY): Payer: Medicaid Other | Admitting: Anesthesiology

## 2019-07-26 ENCOUNTER — Other Ambulatory Visit: Payer: Self-pay | Admitting: General Surgery

## 2019-07-26 ENCOUNTER — Other Ambulatory Visit: Payer: Self-pay

## 2019-07-26 ENCOUNTER — Encounter (HOSPITAL_COMMUNITY)
Admission: RE | Disposition: A | Discharge status: Home / Self Care | Payer: Self-pay | Source: Home / Self Care | Attending: General Surgery

## 2019-07-26 ENCOUNTER — Ambulatory Visit (HOSPITAL_COMMUNITY)
Admission: RE | Admit: 2019-07-26 | Discharge: 2019-07-26 | Disposition: A | Discharge status: Home / Self Care | Payer: Medicaid Other | Attending: General Surgery | Admitting: General Surgery

## 2019-07-26 ENCOUNTER — Encounter (HOSPITAL_COMMUNITY): Payer: Self-pay | Admitting: *Deleted

## 2019-07-26 DIAGNOSIS — Z8674 Personal history of sudden cardiac arrest: Secondary | ICD-10-CM | POA: Insufficient documentation

## 2019-07-26 DIAGNOSIS — I2510 Atherosclerotic heart disease of native coronary artery without angina pectoris: Secondary | ICD-10-CM | POA: Insufficient documentation

## 2019-07-26 DIAGNOSIS — I252 Old myocardial infarction: Secondary | ICD-10-CM | POA: Diagnosis not present

## 2019-07-26 DIAGNOSIS — I10 Essential (primary) hypertension: Secondary | ICD-10-CM | POA: Insufficient documentation

## 2019-07-26 DIAGNOSIS — G4730 Sleep apnea, unspecified: Secondary | ICD-10-CM | POA: Insufficient documentation

## 2019-07-26 DIAGNOSIS — R591 Generalized enlarged lymph nodes: Secondary | ICD-10-CM | POA: Diagnosis present

## 2019-07-26 DIAGNOSIS — J45909 Unspecified asthma, uncomplicated: Secondary | ICD-10-CM | POA: Insufficient documentation

## 2019-07-26 DIAGNOSIS — F17210 Nicotine dependence, cigarettes, uncomplicated: Secondary | ICD-10-CM | POA: Insufficient documentation

## 2019-07-26 DIAGNOSIS — Z955 Presence of coronary angioplasty implant and graft: Secondary | ICD-10-CM | POA: Diagnosis not present

## 2019-07-26 HISTORY — PX: AXILLARY LYMPH NODE BIOPSY: SHX5737

## 2019-07-26 SURGERY — AXILLARY LYMPH NODE BIOPSY
Anesthesia: General | Site: Axilla | Laterality: Left

## 2019-07-26 MED ORDER — KETOROLAC TROMETHAMINE 30 MG/ML IJ SOLN
30.0000 mg | Freq: Once | INTRAMUSCULAR | Status: AC
  Administered 2019-07-26: 30 mg via INTRAVENOUS
  Filled 2019-07-26: qty 1

## 2019-07-26 MED ORDER — PROPOFOL 10 MG/ML IV BOLUS
INTRAVENOUS | Status: AC
  Filled 2019-07-26: qty 20

## 2019-07-26 MED ORDER — ONDANSETRON HCL 4 MG/2ML IJ SOLN: 4.0000 mg | Freq: Once | INTRAMUSCULAR | Status: DC | PRN

## 2019-07-26 MED ORDER — FENTANYL CITRATE (PF) 250 MCG/5ML IJ SOLN
INTRAMUSCULAR | Status: AC
  Filled 2019-07-26: qty 5

## 2019-07-26 MED ORDER — FENTANYL CITRATE (PF) 100 MCG/2ML IJ SOLN
INTRAMUSCULAR | Status: AC
  Filled 2019-07-26: qty 2

## 2019-07-26 MED ORDER — MIDAZOLAM HCL 2 MG/2ML IJ SOLN
INTRAMUSCULAR | Status: AC
  Filled 2019-07-26: qty 2

## 2019-07-26 MED ORDER — LACTATED RINGERS IV SOLN
INTRAVENOUS | Status: DC | PRN
  Administered 2019-07-26: 09:00:00 via INTRAVENOUS

## 2019-07-26 MED ORDER — ARTIFICIAL TEARS OPHTHALMIC OINT
TOPICAL_OINTMENT | OPHTHALMIC | Status: AC
  Filled 2019-07-26: qty 3.5

## 2019-07-26 MED ORDER — HYDROMORPHONE HCL 1 MG/ML IJ SOLN
0.2500 mg | INTRAMUSCULAR | Status: DC | PRN
  Administered 2019-07-26 (×2): 0.5 mg via INTRAVENOUS
  Filled 2019-07-26 (×2): qty 0.5

## 2019-07-26 MED ORDER — MEPERIDINE HCL 50 MG/ML IJ SOLN: 6.2500 mg | INTRAMUSCULAR | Status: DC | PRN

## 2019-07-26 MED ORDER — ONDANSETRON HCL 4 MG/2ML IJ SOLN
INTRAMUSCULAR | Status: DC | PRN
  Administered 2019-07-26: 4 mg via INTRAVENOUS

## 2019-07-26 MED ORDER — BUPIVACAINE LIPOSOME 1.3 % IJ SUSP
INTRAMUSCULAR | Status: AC
  Filled 2019-07-26: qty 20

## 2019-07-26 MED ORDER — FENTANYL CITRATE (PF) 100 MCG/2ML IJ SOLN
INTRAMUSCULAR | Status: DC | PRN
  Administered 2019-07-26: 50 ug via INTRAVENOUS
  Administered 2019-07-26 (×2): 25 ug via INTRAVENOUS
  Administered 2019-07-26: 50 ug via INTRAVENOUS

## 2019-07-26 MED ORDER — PROPOFOL 10 MG/ML IV BOLUS
INTRAVENOUS | Status: DC | PRN
  Administered 2019-07-26: 250 mg via INTRAVENOUS
  Administered 2019-07-26 (×2): 50 mg via INTRAVENOUS

## 2019-07-26 MED ORDER — MIDAZOLAM HCL 5 MG/5ML IJ SOLN
INTRAMUSCULAR | Status: DC | PRN
  Administered 2019-07-26: 2 mg via INTRAVENOUS

## 2019-07-26 MED ORDER — DEXAMETHASONE SODIUM PHOSPHATE 4 MG/ML IJ SOLN
INTRAMUSCULAR | Status: DC | PRN
  Administered 2019-07-26: 8 mg via INTRAVENOUS

## 2019-07-26 MED ORDER — BUPIVACAINE HCL (PF) 0.5 % IJ SOLN
INTRAMUSCULAR | Status: AC
  Filled 2019-07-26: qty 30

## 2019-07-26 MED ORDER — OXYCODONE-ACETAMINOPHEN 7.5-325 MG PO TABS: 1.0000 | ORAL_TABLET | Freq: Four times a day (QID) | ORAL | 0 refills | Status: DC | PRN

## 2019-07-26 MED ORDER — CHLORHEXIDINE GLUCONATE CLOTH 2 % EX PADS: 6.0000 | MEDICATED_PAD | Freq: Once | CUTANEOUS | Status: DC

## 2019-07-26 MED ORDER — HEMOSTATIC AGENTS (NO CHARGE) OPTIME
TOPICAL | Status: DC | PRN
  Administered 2019-07-26: 1 via TOPICAL

## 2019-07-26 MED ORDER — LIDOCAINE HCL (CARDIAC) PF 100 MG/5ML IV SOSY
PREFILLED_SYRINGE | INTRAVENOUS | Status: DC | PRN
  Administered 2019-07-26: 60 mg via INTRAVENOUS

## 2019-07-26 MED ORDER — 0.9 % SODIUM CHLORIDE (POUR BTL) OPTIME
TOPICAL | Status: DC | PRN
  Administered 2019-07-26: 1000 mL

## 2019-07-26 MED ORDER — BUPIVACAINE HCL (PF) 0.5 % IJ SOLN
INTRAMUSCULAR | Status: DC | PRN
  Administered 2019-07-26: 10 mL

## 2019-07-26 MED ORDER — GLYCOPYRROLATE PF 0.2 MG/ML IJ SOSY
PREFILLED_SYRINGE | INTRAMUSCULAR | Status: DC | PRN
  Administered 2019-07-26: .2 mg via INTRAVENOUS

## 2019-07-26 MED ORDER — LACTATED RINGERS IV SOLN
Freq: Once | INTRAVENOUS | Status: AC
  Administered 2019-07-26: 1000 mL via INTRAVENOUS

## 2019-07-26 SURGICAL SUPPLY — 35 items
APPLIER CLIP 11 MED OPEN (CLIP) ×4
APPLIER CLIP 9.375 SM OPEN (CLIP) ×2
BLADE SURG 15 STRL LF DISP TIS (BLADE) ×1 IMPLANT
BLADE SURG 15 STRL SS (BLADE) ×1
CHLORAPREP W/TINT 26 (MISCELLANEOUS) ×2 IMPLANT
CLIP APPLIE 11 MED OPEN (CLIP) ×2 IMPLANT
CLIP APPLIE 9.375 SM OPEN (CLIP) ×1 IMPLANT
CLOTH BEACON ORANGE TIMEOUT ST (SAFETY) ×2 IMPLANT
CONT SPEC 4OZ CLIKSEAL STRL BL (MISCELLANEOUS) ×2 IMPLANT
COVER LIGHT HANDLE STERIS (MISCELLANEOUS) ×4 IMPLANT
COVER WAND RF STERILE (DRAPES) ×2 IMPLANT
DECANTER SPIKE VIAL GLASS SM (MISCELLANEOUS) ×2 IMPLANT
DERMABOND ADVANCED (GAUZE/BANDAGES/DRESSINGS) ×1
DERMABOND ADVANCED .7 DNX12 (GAUZE/BANDAGES/DRESSINGS) ×1 IMPLANT
ELECT REM PT RETURN 9FT ADLT (ELECTROSURGICAL) ×2
ELECTRODE REM PT RTRN 9FT ADLT (ELECTROSURGICAL) ×1 IMPLANT
GAUZE 4X4 16PLY RFD (DISPOSABLE) ×2 IMPLANT
GLOVE BIO SURGEON STRL SZ7 (GLOVE) ×2 IMPLANT
GLOVE BIOGEL PI IND STRL 7.0 (GLOVE) ×2 IMPLANT
GLOVE BIOGEL PI INDICATOR 7.0 (GLOVE) ×2
GLOVE SURG SS PI 7.5 STRL IVOR (GLOVE) ×2 IMPLANT
GOWN STRL REUS W/TWL LRG LVL3 (GOWN DISPOSABLE) ×4 IMPLANT
HEMOSTAT ARISTA ABSORB 1G (HEMOSTASIS) ×2 IMPLANT
INST SET MINOR GENERAL (KITS) ×2 IMPLANT
KIT TURNOVER KIT A (KITS) ×2 IMPLANT
MANIFOLD NEPTUNE II (INSTRUMENTS) ×2 IMPLANT
NEEDLE HYPO 25X1 1.5 SAFETY (NEEDLE) ×2 IMPLANT
NS IRRIG 1000ML POUR BTL (IV SOLUTION) ×2 IMPLANT
PACK MINOR (CUSTOM PROCEDURE TRAY) ×2 IMPLANT
PAD ARMBOARD 7.5X6 YLW CONV (MISCELLANEOUS) ×2 IMPLANT
SET BASIN LINEN APH (SET/KITS/TRAYS/PACK) ×2 IMPLANT
SUT MNCRL AB 4-0 PS2 18 (SUTURE) ×2 IMPLANT
SUT VIC AB 3-0 SH 27 (SUTURE) ×1
SUT VIC AB 3-0 SH 27X BRD (SUTURE) ×1 IMPLANT
SYR CONTROL 10ML LL (SYRINGE) ×2 IMPLANT

## 2019-07-26 NOTE — Transfer of Care (Signed)
Immediate Anesthesia Transfer of Care Note  Patient: Nathan Waters  Procedure(s) Performed: AXILLARY LYMPH NODE BIOPSY (Left Axilla)  Patient Location: PACU  Anesthesia Type:General  Level of Consciousness: drowsy and patient cooperative  Airway & Oxygen Therapy: Patient Spontanous Breathing  Post-op Assessment: Report given to RN and Post -op Vital signs reviewed and stable  Post vital signs: Reviewed and stable  Last Vitals:  Vitals Value Taken Time  BP 152/88 07/26/19 1023  Temp    Pulse 95 07/26/19 1026  Resp 14 07/26/19 1026  SpO2 88 % 07/26/19 1026  Vitals shown include unvalidated device data.  Last Pain:  Vitals:   07/26/19 0810  TempSrc: Oral  PainSc: 0-No pain      Patients Stated Pain Goal: 7 ([id number])  Complications: No apparent anesthesia complications

## 2019-07-26 NOTE — Anesthesia Preprocedure Evaluation (Signed)
Anesthesia Evaluation  Patient identified by MRN, date of birth, ID band Patient awake    Reviewed: Allergy & Precautions, NPO status , Patient's Chart, lab work & pertinent test results  Airway Mallampati: III  TM Distance: >3 FB Neck ROM: Full    Dental  (+) Poor Dentition, Missing, Chipped, Dental Advisory Given   Pulmonary asthma (inhaler  - 1 year ago) , Sleep apnea: snoring. , Current SmokerPatient did not abstain from smoking.,    breath sounds clear to auscultation       Cardiovascular Exercise Tolerance: Good hypertension (not on meds), + CAD, + Past MI and + Cardiac Stents (stentx1 - 2013)  Normal cardiovascular exam Rhythm:Regular Rate:Normal  Study Highlights   Please note patient only reached 82% of THR. This may lower the sensitivity of the test to detect ischemia.  Blood pressure demonstrated a normal response to exercise.  Horizontal ST segment depression ST segment depression of 1 mm was noted during stress in the V5 and V6 leads, and returning to baseline after less than 1 minute of recovery.  [facility] treadmill score of 1 based on level of exertion achieved, consistent with intermediate risk for major cardiac events.  Abnormal stress test based on level of exertion achieved during suboptimal stress test. Consider pharmacological testing if clinically indicated    14-Jun-2019 23:29:27 [facility] ROUTINE RECORD Sinus rhythm Probable left atrial enlargement Anteroseptal infarct, old Baseline wander in lead(s) V2 V5 No significant change since last tracing 19 Sep 2018 Confirmed by Rolland Porter [phone]) on 06/14/2019 11:32:15 PM   Neuro/Psych  Headaches, negative psych ROS   GI/Hepatic Neg liver ROS, GERD: mild, occasional.  ,Splenomegaly, splenectomy   Endo/Other  negative endocrine ROS  Renal/GU negative Renal ROS  negative genitourinary   Musculoskeletal Back sx   Abdominal   Peds  Hematology negative hematology ROS (+)   Anesthesia Other Findings   Reproductive/Obstetrics negative OB ROS                           Anesthesia Physical Anesthesia Plan  ASA: III  Anesthesia Plan: General   Post-op Pain Management:    Induction: Intravenous  PONV Risk Score and Plan: 1 and Dexamethasone and Ondansetron  Airway Management Planned: LMA  Additional Equipment:   Intra-op Plan:   Post-operative Plan: Extubation in OR  Informed Consent: I have reviewed the patients History and Physical, chart, labs and discussed the procedure including the risks, benefits and alternatives for the proposed anesthesia with the patient or authorized representative who has indicated his/her understanding and acceptance.     Dental advisory given  Plan Discussed with: CRNA  Anesthesia Plan Comments:        Anesthesia Quick Evaluation

## 2019-07-26 NOTE — Discharge Instructions (Signed)
May shower tomorrow   Open Lymph Node Biopsy, Care After This sheet gives you information about how to care for yourself after your procedure. Your health care provider may also give you more specific instructions. If you have problems or questions, contact your health care provider. What can I expect after the procedure? After the procedure, it is common to have:  Bruising.  Soreness.  Mild swelling. Follow these instructions at home: Medicines  Take over-the-counter and prescription medicines only as told by your health care provider.  If you were prescribed an antibiotic medicine, take it as told by your health care provider. Do not stop taking the antibiotic even if you start to feel better. Incision care   Follow instructions from your health care provider about how to take care of your incision. Make sure you: ? Wash your hands with soap and water before and after you change your bandage (dressing). If soap and water are not available, use hand sanitizer. ? Change your dressing as told by your health care provider. ? Leave stitches (sutures), skin glue, or adhesive strips in place. These skin closures may need to stay in place for 2 weeks or longer. If adhesive strip edges start to loosen and curl up, you may trim the loose edges. Do not remove adhesive strips completely unless your health care provider tells you to do that.  Check your incision area every day for signs of infection. Check for: ? More redness, swelling, or pain. ? Fluid or blood. ? Warmth. ? Pus or a bad smell. Driving  Do not drive for 24 hours if you were given a sedative during your procedure.  Do not drive or use heavy machinery while taking prescription pain medicine. General instructions  Return to your normal activities as told by your health care provider. Ask your health care provider what activities are safe for you.  Do not take baths, swim, or use a hot tub until your health care provider  approves. Ask your health care provider if you may take showers. You may only be allowed to take sponge baths.  Keep all follow-up visits as told by your health care provider. This is important. Contact a health care provider if:  You have more redness, swelling, or pain around your incision.  You have fluid or blood coming from your incision.  Your incision feels warm to the touch.  You have pus or a bad smell coming from your incision.  You have a fever.  You have pain or numbness that gets worse or lasts longer than a few days. Summary  After a lymph node biopsy, it is common to have bruising, soreness, and mild swelling.  Follow your health care provider's instructions about taking care of yourself at home. You will be told how to take medicines, take care of your incision, and check for infection.  Return to your normal activities as told by your health care provider. Ask your health care provider what activities are safe for you.  Contact a health care provider if you have more redness, swelling, or pain around your incision, you have a fever, or you have worsening pain or numbness. This information is not intended to replace advice given to you by your health care provider. Make sure you discuss any questions you have with your health care provider. Document Released: 10/30/2015 Document Revised: 05/10/2018 Document Reviewed: 05/10/2018 Elsevier Patient Education  2020 [employer].

## 2019-07-26 NOTE — Op Note (Signed)
Patient:  Nathan Waters  DOB:  31-Oct-1980  MRN:  [id number]   Preop Diagnosis: Generalized lymphadenopathy, rule out lymphoma  Postop Diagnosis: Same  Procedure: Left axillary lymph node biopsy  Surgeon: Aviva Signs, MD  Anes: General  Indications: Patient is a 38 year old white male who presents with generalized lymphadenopathy.  Oncology referred the patient for left axillary lymph node biopsy to rule out lymphoma.  The risks and benefits of the procedure including bleeding, infection, and pain were fully explained to the patient, who gave informed consent.  Procedure note: The patient was placed in the supine position.  After general anesthesia was administered, the left axilla was prepped and draped using usual sterile technique with ChloraPrep.  Surgical site confirmation was performed.  Incision was made in the left axilla.  The dissection was taken down to the deep tissue of the left axilla.  2 enlarged lymph nodes were found and these were removed.  They were sent to pathology for flow cytometry.  A bleeding was controlled using clips.  Care was taken to avoid any nerves.  Arista was placed in the deep bed of the left axilla.  The subcutaneous layer was reapproximated using a 3-0 Vicryl interrupted suture.  The skin was closed using a 4-0 Monocryl subcuticular suture.  0.5% Sensorcaine was instilled into the surrounding wound.  Dermabond was applied.  All tape and needle counts were correct at the end of the procedure.  The patient was awakened and transferred to PACU in stable condition.  Complications: None  EBL: Minimal  Specimen: Left axillary lymph nodes

## 2019-07-26 NOTE — Interval H&P Note (Signed)
History and Physical Interval Note:  07/26/2019 9:03 AM  Nathan Waters  has presented today for surgery, with the diagnosis of LYMPHADENOPATHY.  The various methods of treatment have been discussed with the patient and family. After consideration of risks, benefits and other options for treatment, the patient has consented to  Procedure(s): AXILLARY LYMPH NODE BIOPSY (Left) as a surgical intervention.  The patient's history has been reviewed, patient examined, no change in status, stable for surgery.  I have reviewed the patient's chart and labs.  Questions were answered to the patient's satisfaction.     Aviva Signs

## 2019-07-26 NOTE — Anesthesia Procedure Notes (Signed)
Procedure Name: LMA Insertion Date/Time: 07/26/2019 9:12 AM Performed by: Adams, Amy A, CRNA Pre-anesthesia Checklist: Patient identified, Emergency Drugs available, Suction available, Patient being monitored and Timeout performed Patient Re-evaluated:Patient Re-evaluated prior to induction Oxygen Delivery Method: Circle system utilized Preoxygenation: Pre-oxygenation with 100% oxygen Induction Type: IV induction LMA: LMA inserted LMA Size: 5.0 Placement Confirmation: positive ETCO2 and breath sounds checked- equal and bilateral Tube secured with: Tape Dental Injury: Teeth and Oropharynx as per pre-operative assessment

## 2019-07-26 NOTE — Anesthesia Postprocedure Evaluation (Signed)
Anesthesia Post Note  Patient: JASIAS HARDIMAN  Procedure(s) Performed: AXILLARY LYMPH NODE BIOPSY (Left Axilla)  Patient location during evaluation: PACU Anesthesia Type: General Level of consciousness: awake and alert and oriented Pain management: pain level controlled Vital Signs Assessment: post-procedure vital signs reviewed and stable Respiratory status: spontaneous breathing Cardiovascular status: blood pressure returned to baseline and stable Postop Assessment: no apparent nausea or vomiting Anesthetic complications: no     Last Vitals:  Vitals:   07/26/19 0810 07/26/19 1023  BP: (!) 133/94   Resp: 17   Temp: 36.7 C (!) 36.4 C  SpO2: 96%     Last Pain:  Vitals:   07/26/19 1023  TempSrc:   PainSc: (P) 0-No pain                 Vlasta Baskin

## 2019-07-29 ENCOUNTER — Encounter (HOSPITAL_COMMUNITY): Payer: Self-pay | Admitting: General Surgery

## 2019-07-31 LAB — SURGICAL PATHOLOGY

## 2019-08-01 ENCOUNTER — Encounter (HOSPITAL_COMMUNITY): Payer: Self-pay | Admitting: Hematology

## 2019-08-01 ENCOUNTER — Other Ambulatory Visit: Payer: Self-pay

## 2019-08-01 ENCOUNTER — Inpatient Hospital Stay (HOSPITAL_COMMUNITY): Payer: Medicaid Other | Attending: Hematology | Admitting: Hematology

## 2019-08-01 VITALS — BP 135/84 | HR 92 | Temp 97.5°F | Resp 18 | Wt 271.9 lb

## 2019-08-01 DIAGNOSIS — R591 Generalized enlarged lymph nodes: Secondary | ICD-10-CM | POA: Diagnosis not present

## 2019-08-01 DIAGNOSIS — F17210 Nicotine dependence, cigarettes, uncomplicated: Secondary | ICD-10-CM | POA: Diagnosis not present

## 2019-08-01 DIAGNOSIS — R590 Localized enlarged lymph nodes: Secondary | ICD-10-CM | POA: Insufficient documentation

## 2019-08-01 NOTE — Patient Instructions (Addendum)
[place] at [facility] Discharge Instructions  You were seen today by Dr. Delton Coombes. He went over your recent test results. He will schedule you for a PET scan. He will see you back after your PET scan for follow up.   Thank you for choosing [place] at [facility] to provide your oncology and hematology care.  To afford each patient quality time with our provider, please arrive at least 15 minutes before your scheduled appointment time.   If you have a lab appointment with the [facility] please come in thru the  Main Entrance and check in at the main information desk  You need to re-schedule your appointment should you arrive 10 or more minutes late.  We strive to give you quality time with our providers, and arriving late affects you and other patients whose appointments are after yours.  Also, if you no show three or more times for appointments you may be dismissed from the clinic at the providers discretion.     Again, thank you for choosing [facility].  Our hope is that these requests will decrease the amount of time that you wait before being seen by our physicians.       _____________________________________________________________  Should you have questions after your visit to [facility], please contact our office at [phone] between the hours of 8:00 a.m. and 4:30 p.m.  Voicemails left after 4:00 p.m. will not be returned until the following business day.  For prescription refill requests, have your pharmacy contact our office and allow 72 hours.    Cancer Center Support Programs:   > Cancer Support Group  2nd Tuesday of the month 1pm-2pm, Journey Room

## 2019-08-01 NOTE — Assessment & Plan Note (Signed)
1.  Pelvic lymphadenopathy: - He had a recent hospital admission with cholecystitis. -CTAP on 06/15/2019 showed incidental bilateral external iliac adenopathy measuring up to 3.4 cm on the right side.  There is a 5 x 4 cm soft tissue mass in the superficial soft tissues of the anteromedial right upper thigh.  Multiple mildly enlarged upper abdominal lymph nodes measuring up to 13 mm in short axis in the gastrohepatic space.  Several scattered small nodular density in the upper omentum measure up to 7 mm in short axis. - He denies any fevers, night sweats or weight loss in the last 6 months. 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 Dema Severin count was elevated since 2011, predominantly neutrophils and monocytes. -LDH was normal. -CT chest showed mediastinal adenopathy and small left axillary adenopathy. -Biopsy of the left axillary lymph node on 07/26/2019 was benign tissue. -He has pathologically enlarged mediastinal adenopathy.  He also has pelvic adenopathy with mass in anteromedial right upper thigh. -I would strongly recommend PET CT scan which will guide [place] in identifying the right area for biopsy. -I will see him back after the PET CT scan.  2.  Tobacco abuse: -Current active smoker, smokes 1 pack/day for 24 years.  3.  Family history: -Father was diagnosed with metastatic cancer right before his death.  Mother had throat cancer.  Paternal grandfather had stomach cancer.  4.  Leukocytosis: -He has predominantly neutrophilic leukocytosis since 2011. 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

## 2019-08-01 NOTE — Progress Notes (Signed)
[place], [place] [address]   CLINIC:  Medical Oncology/Hematology  PCP:  Sofie Rower, PA-C [place] [address] [place] [address] [phone]   REASON FOR VISIT:  Follow-up for lymph node biopsy results.  CURRENT THERAPY: Work-up in progress.   INTERVAL HISTORY:  [name] 38 y.o. male seen for follow-up after the left axillary lymph node biopsy.  Appetite and energy levels are 100%.  Denied any fevers, night sweats or weight loss in the last 6 months.  Reports migraine headaches about 3 times per month.  Has some sleep problems.  Reports pain in the left axillary region, 6 out of 10 since the biopsy.    REVIEW OF SYSTEMS:  Review of Systems  Neurological: Positive for headaches.  Psychiatric/Behavioral: Positive for sleep disturbance.  All other systems reviewed and are negative.    PAST MEDICAL/SURGICAL HISTORY:  Past Medical History:  Diagnosis Date  . Asthma   . Coronary atherosclerosis of native coronary artery    BMS to mid circumflex 11/2011, LVEF 55-60%  . Essential hypertension, benign   . Migraine   . Mixed hyperlipidemia   . STEMI (ST elevation myocardial infarction) ([place])    Complicated by VF arrest 11/2011   Past Surgical History:  Procedure Laterality Date  . AXILLARY LYMPH NODE BIOPSY Left 07/26/2019   Procedure: AXILLARY LYMPH NODE BIOPSY;  Surgeon: Aviva Signs, MD;  Location: [facility];  Service: General;  Laterality: Left;  . BACK SURGERY    . CARDIAC CATHETERIZATION    . LEFT HEART CATHETERIZATION WITH CORONARY ANGIOGRAM N/A 11/21/2011   Procedure: LEFT HEART CATHETERIZATION WITH CORONARY ANGIOGRAM;  Surgeon: Peter M Martinique, MD;  Location: [facility] CATH LAB;  Service: Cardiovascular;  Laterality: N/A;  . PERCUTANEOUS CORONARY STENT INTERVENTION (PCI-S) N/A 11/21/2011   Procedure: PERCUTANEOUS CORONARY STENT INTERVENTION (PCI-S);  Surgeon: Peter M Martinique, MD;  Location: [facility] CATH LAB;  Service: Cardiovascular;   Laterality: N/A;  . SPLENECTOMY       SOCIAL HISTORY:  Social History   Socioeconomic History  . Marital status: Divorced    Spouse name: Not on file  . Number of children: 2  . Years of education: Not on file  . Highest education level: Not on file  Occupational History  . 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 Financial resource strain: Not hard at all  . Food insecurity    Worry: Never true    Inability: Never true  . Transportation needs    Medical: No    Non-medical: No  Tobacco Use  . Smoking status: Current Every Day Smoker    Packs/day: 1.00    Types: Cigarettes  . Smokeless tobacco: Never Used  Substance and Sexual Activity  . Alcohol use: Yes    Comment: Occasionally  . Drug use: No  . Sexual activity: Yes    Birth control/protection: Rhythm  Lifestyle  . Physical activity    Days per week: 3 days    Minutes per session: 30 min  . Stress: Only a little  Relationships  . Social connections    Talks on phone: More than three times a week    Gets together: Twice a week    Attends religious service: Never    Active member of club or organization: No    Attends meetings of clubs or organizations: Not on file    Relationship status: Divorced  . Intimate partner violence    Fear of current  or ex partner: No    Emotionally abused: No    Physically abused: No    Forced sexual activity: No  Other Topics Concern  . Not on file  Social History Narrative  . Not on file    FAMILY HISTORY:  Family History  Problem Relation Age of Onset  . Malignant hyperthermia Mother   . Cancer Mother   . Malignant hyperthermia Brother   . Heart disease Brother   . Asthma Brother   . COPD Brother   . Heart disease Father   . Cancer Father   . ADD / ADHD Daughter   . ADD / ADHD Son     CURRENT MEDICATIONS:  Outpatient Encounter Medications as of 08/01/2019  Medication Sig  . albuterol (PROVENTIL HFA;VENTOLIN HFA) 108 (90 Base) MCG/ACT inhaler Inhale 2 puffs  into the lungs every 6 (six) hours as needed for wheezing or shortness of breath.   . cetirizine (ZYRTEC) 10 MG tablet Take 10 mg by mouth daily.  . nitroGLYCERIN (NITROSTAT) 0.4 MG SL tablet Place 1 tablet (0.4 mg total) under the tongue every 5 (five) minutes as needed for chest pain. (Patient not taking: Reported on 08/01/2019)  . oxyCODONE-acetaminophen (PERCOCET) 7.5-325 MG tablet Take 1 tablet by mouth every 6 (six) hours as needed. (Patient not taking: Reported on 08/01/2019)   No facility-administered encounter medications on file as of 08/01/2019.     ALLERGIES:  Allergies  Allergen Reactions  . Penicillins Anaphylaxis    Did it involve swelling of the face/tongue/throat, SOB, or low BP? Yes Did it involve sudden or severe rash/hives, skin peeling, or any reaction on the inside of your mouth or nose? No Did you need to seek medical attention at a hospital or doctor's office? Yes When did it last happen?Childhood allergy If all above answers are "NO", may proceed with cephalosporin use.      PHYSICAL EXAM:  ECOG Performance status: 1  Vitals:   08/01/19 1029  BP: 135/84  Pulse: 92  Resp: 18  Temp: (!) 97.5 F (36.4 C)  SpO2: 95%   Filed Weights   08/01/19 1029  Weight: 271 lb 14.4 oz (123.3 kg)    Physical Exam Vitals signs reviewed.  Constitutional:      Appearance: Normal appearance.  Cardiovascular:     Rate and Rhythm: Normal rate and regular rhythm.     Heart sounds: Normal heart sounds.  Pulmonary:     Effort: Pulmonary effort is normal.     Breath sounds: Normal breath sounds.  Abdominal:     General: There is no distension.     Palpations: Abdomen is soft. There is no mass.  Musculoskeletal:        General: No swelling.  Lymphadenopathy:     Cervical: No cervical adenopathy.  Skin:    General: Skin is warm.  Neurological:     Mental Status: He is alert and oriented to person, place, and time.  Psychiatric:        Mood and Affect: Mood  normal.        Behavior: Behavior normal.    Left axillary biopsy site is well-healed.  LABORATORY DATA:  I have reviewed the labs as listed.  CBC    Component Value Date/Time   WBC 13.6 (H) 06/15/2019 0518   RBC 4.76 06/15/2019 0518   HGB 15.7 06/15/2019 0518   HCT 47.4 06/15/2019 0518   PLT 327 06/15/2019 0518   MCV 99.6 06/15/2019 0518   MCH 33.0  06/15/2019 0518   MCHC 33.1 06/15/2019 0518   RDW 14.3 06/15/2019 0518   LYMPHSABS 3.2 09/19/2018 1948   MONOABS 1.1 (H) 09/19/2018 1948   EOSABS 0.4 09/19/2018 1948   BASOSABS 0.1 09/19/2018 1948   CMP Latest Ref Rng & Units 06/15/2019 06/14/2019 09/19/2018  Glucose 70 - 99 mg/dL 88 126(H) 110(H)  BUN 6 - 20 mg/dL 9 10 9   Creatinine 0.61 - 1.24 mg/dL 0.84 1.03 0.85  Sodium 135 - 145 mmol/L 140 140 138  Potassium 3.5 - 5.1 mmol/L 3.7 4.2 3.7  Chloride 98 - 111 mmol/L 107 107 110  CO2 22 - 32 mmol/L 25 24 23   Calcium 8.9 - 10.3 mg/dL 9.5 9.9 8.8(L)  Total Protein 6.5 - 8.1 g/dL 7.2 7.7 -  Total Bilirubin 0.3 - 1.2 mg/dL 0.7 0.3 -  Alkaline Phos 38 - 126 U/L 100 120 -  AST 15 - 41 U/L 26 26 -  ALT 0 - 44 U/L 38 43 -       DIAGNOSTIC IMAGING:  I have independently reviewed the scans and discussed with the patient.   ASSESSMENT & PLAN:   Lymphadenopathy 1.  Pelvic lymphadenopathy: - He had a recent hospital admission with cholecystitis. -CTAP on 06/15/2019 showed incidental bilateral external iliac adenopathy measuring up to 3.4 cm on the right side.  There is a 5 x 4 cm soft tissue mass in the superficial soft tissues of the anteromedial right upper thigh.  Multiple mildly enlarged upper abdominal lymph nodes measuring up to 13 mm in short axis in the gastrohepatic space.  Several scattered small nodular density in the upper omentum measure up to 7 mm in short axis. - He denies any fevers, night sweats or weight loss in the last 6 months. 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 Dema Severin count was elevated since 2011, predominantly neutrophils and monocytes. -LDH was normal. -CT chest showed mediastinal adenopathy and small left axillary adenopathy. -Biopsy of the left axillary lymph node on 07/26/2019 was benign tissue. -He has pathologically enlarged mediastinal adenopathy.  He also has pelvic adenopathy with mass in anteromedial right upper thigh. -I would strongly recommend PET CT scan which will guide [place] in identifying the right area for biopsy. -I will see him back after the PET CT scan.  2.  Tobacco abuse: -Current active smoker, smokes 1 pack/day for 24 years.  3.  Family history: -Father was diagnosed with metastatic cancer right before his death.  Mother had throat cancer.  Paternal grandfather had stomach cancer.  4.  Leukocytosis: -He has predominantly neutrophilic leukocytosis since 2011. 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   Total time spent is 25 minutes with more than 50% of the time spent face-to-face discussing pathology report, reviewing scans, counseling and coordination of care.  Orders placed this encounter:  Orders Placed This Encounter  Procedures  . NM PET Image Restag (PS) Skull Base To Thigh      Derek Jack, MD [place] [phone]

## 2019-08-12 ENCOUNTER — Other Ambulatory Visit: Payer: Self-pay

## 2019-08-12 ENCOUNTER — Ambulatory Visit (HOSPITAL_COMMUNITY)
Admission: RE | Admit: 2019-08-12 | Discharge: 2019-08-12 | Disposition: A | Discharge status: Home / Self Care | Payer: Medicaid Other | Source: Ambulatory Visit | Attending: Hematology | Admitting: Hematology

## 2019-08-12 DIAGNOSIS — R591 Generalized enlarged lymph nodes: Secondary | ICD-10-CM | POA: Diagnosis present

## 2019-08-12 MED ORDER — FLUDEOXYGLUCOSE F - 18 (FDG) INJECTION
14.8500 | Freq: Once | INTRAVENOUS | Status: AC | PRN
  Administered 2019-08-12: 14.85 via INTRAVENOUS

## 2019-08-14 ENCOUNTER — Other Ambulatory Visit: Payer: Self-pay

## 2019-08-14 ENCOUNTER — Encounter (HOSPITAL_COMMUNITY): Payer: Self-pay | Admitting: Hematology

## 2019-08-14 ENCOUNTER — Inpatient Hospital Stay (HOSPITAL_BASED_OUTPATIENT_CLINIC_OR_DEPARTMENT_OTHER): Payer: Medicaid Other | Admitting: Hematology

## 2019-08-14 DIAGNOSIS — R591 Generalized enlarged lymph nodes: Secondary | ICD-10-CM | POA: Diagnosis not present

## 2019-08-14 DIAGNOSIS — R590 Localized enlarged lymph nodes: Secondary | ICD-10-CM | POA: Diagnosis not present

## 2019-08-14 NOTE — Progress Notes (Signed)
Nathan Waters, Nathan Waters [id number]   CLINIC:  Medical Oncology/Hematology  PCP:  Sofie Rower, PA-C [place] [address] [place] [address] [phone]   REASON FOR VISIT:  Follow-up for lymph node biopsy results.  CURRENT THERAPY: Work-up in progress.   INTERVAL HISTORY:  Nathan Waters 38 y.o. male seen for follow-up of generalized lymphadenopathy.  He had a previous left axillary lymph node biopsy.  Appetite and energy levels are 100%.  He does report some pain in the left axillary biopsy site.  He does report some sleep problems.  Denies any fevers, night sweats or weight loss in the last 6 months.  Denies any infections or hospitalizations.    REVIEW OF SYSTEMS:  Review of Systems  Psychiatric/Behavioral: Positive for sleep disturbance.  All other systems reviewed and are negative.    PAST MEDICAL/SURGICAL HISTORY:  Past Medical History:  Diagnosis Date   Asthma    Coronary atherosclerosis of native coronary artery    BMS to mid circumflex 11/2011, LVEF 55-60%   Essential hypertension, benign    Migraine    Mixed hyperlipidemia    STEMI (ST elevation myocardial infarction) ([place])    Complicated by VF arrest 11/2011   Past Surgical History:  Procedure Laterality Date   AXILLARY LYMPH NODE BIOPSY Left 07/26/2019   Procedure: AXILLARY LYMPH NODE BIOPSY;  Surgeon: Aviva Signs, MD;  Location: [facility];  Service: General;  Laterality: Left;   BACK [place] WITH CORONARY ANGIOGRAM N/A 11/21/2011   Procedure: LEFT HEART CATHETERIZATION WITH CORONARY ANGIOGRAM;  Surgeon: Peter M Martinique, MD;  Location: [facility] CATH LAB;  Service: Cardiovascular;  Laterality: N/A;   PERCUTANEOUS CORONARY STENT INTERVENTION (PCI-S) N/A 11/21/2011   Procedure: PERCUTANEOUS CORONARY STENT INTERVENTION (PCI-S);  Surgeon: Peter M Martinique, MD;  Location: [facility] CATH LAB;  Service: Cardiovascular;  Laterality:  N/A;   SPLENECTOMY       SOCIAL HISTORY:  Social History   Socioeconomic History   Marital status: Divorced    Spouse name: Not on file   Number of children: 2   Years of education: Not on file   Highest education level: Not on file  Occupational History   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, product/process development strain: Not hard at all   Food insecurity    Worry: Never true    Inability: Never true   Transportation needs    Medical: No    Non-medical: No  Tobacco Use   Smoking status: Current Every Day Smoker    Packs/day: 1.00    Types: Cigarettes   Smokeless tobacco: Never Used  Substance and Sexual Activity   Alcohol use: Yes    Comment: Occasionally   Drug use: No   Sexual activity: Yes    Birth control/protection: Rhythm  Lifestyle   Physical activity    Days per week: 3 days    Minutes per session: 30 min   Stress: Only a little  Relationships   Social connections    Talks on phone: More than three times a week    Gets together: Twice a week    Attends religious service: Never    Active member of club or organization: No    Attends meetings of clubs or organizations: Not on file    Relationship status: Divorced   Intimate partner violence    Fear of current or ex partner:  No    Emotionally abused: No    Physically abused: No    Forced sexual activity: No  Other Topics Concern   Not on file  Social History Narrative   Not on file    FAMILY HISTORY:  Family History  Problem Relation Age of Onset   Malignant hyperthermia Mother    Cancer Mother    Malignant hyperthermia Brother    Heart disease Brother    Asthma Brother    COPD Brother    Heart disease Father    Cancer Father    ADD / ADHD Daughter    ADD / ADHD Son     CURRENT MEDICATIONS:  Outpatient Encounter Medications as of 08/14/2019  Medication Sig   cetirizine (ZYRTEC) 10 MG tablet Take 10 mg by mouth daily.   albuterol (PROVENTIL HFA;VENTOLIN  HFA) 108 (90 Base) MCG/ACT inhaler Inhale 2 puffs into the lungs every 6 (six) hours as needed for wheezing or shortness of breath.    nitroGLYCERIN (NITROSTAT) 0.4 MG SL tablet Place 1 tablet (0.4 mg total) under the tongue every 5 (five) minutes as needed for chest pain. (Patient not taking: Reported on 08/01/2019)   [DISCONTINUED] oxyCODONE-acetaminophen (PERCOCET) 7.5-325 MG tablet Take 1 tablet by mouth every 6 (six) hours as needed. (Patient not taking: Reported on 08/01/2019)   No facility-administered encounter medications on file as of 08/14/2019.     ALLERGIES:  Allergies  Allergen Reactions   Penicillins Anaphylaxis    Did it involve swelling of the face/tongue/throat, SOB, or low BP? Yes Did it involve sudden or severe rash/hives, skin peeling, or any reaction on the inside of your mouth or nose? No Did you need to seek medical attention at a hospital or doctor's office? Yes When did it last happen?Childhood allergy If all above answers are NO, may proceed with cephalosporin use.      PHYSICAL EXAM:  ECOG Performance status: 1  Vitals:   08/14/19 1405  BP: 138/87  Pulse: 87  Resp: 20  Temp: (!) 96.8 F (36 C)  SpO2: 96%   Filed Weights   08/14/19 1405  Weight: 269 lb 3.2 oz (122.1 kg)    Physical Exam Vitals signs reviewed.  Constitutional:      Appearance: Normal appearance.  Cardiovascular:     Rate and Rhythm: Normal rate and regular rhythm.     Heart sounds: Normal heart sounds.  Pulmonary:     Effort: Pulmonary effort is normal.     Breath sounds: Normal breath sounds.  Abdominal:     General: There is no distension.     Palpations: Abdomen is soft. There is no mass.  Musculoskeletal:        General: No swelling.  Lymphadenopathy:     Cervical: No cervical adenopathy.  Skin:    General: Skin is warm.  Neurological:     Mental Status: He is alert and oriented to person, place, and time.  Psychiatric:        Mood and Affect: Mood  normal.        Behavior: Behavior normal.    Left axillary biopsy site is well-healed.  LABORATORY DATA:  I have reviewed the labs as listed.  CBC    Component Value Date/Time   WBC 13.6 (H) 06/15/2019 0518   RBC 4.76 06/15/2019 0518   HGB 15.7 06/15/2019 0518   HCT 47.4 06/15/2019 0518   PLT 327 06/15/2019 0518   MCV 99.6 06/15/2019 0518   MCH 33.0 06/15/2019 0518  MCHC 33.1 06/15/2019 0518   RDW 14.3 06/15/2019 0518   LYMPHSABS 3.2 09/19/2018 1948   MONOABS 1.1 (H) 09/19/2018 1948   EOSABS 0.4 09/19/2018 1948   BASOSABS 0.1 09/19/2018 1948   CMP Latest Ref Rng & Units 06/15/2019 06/14/2019 09/19/2018  Glucose 70 - 99 mg/dL 88 126(H) 110(H)  BUN 6 - 20 mg/dL 9 10 9   Creatinine 0.61 - 1.24 mg/dL 0.84 1.03 0.85  Sodium 135 - 145 mmol/L 140 140 138  Potassium 3.5 - 5.1 mmol/L 3.7 4.2 3.7  Chloride 98 - 111 mmol/L 107 107 110  CO2 22 - 32 mmol/L 25 24 23   Calcium 8.9 - 10.3 mg/dL 9.5 9.9 8.8(L)  Total Protein 6.5 - 8.1 g/dL 7.2 7.7 -  Total Bilirubin 0.3 - 1.2 mg/dL 0.7 0.3 -  Alkaline Phos 38 - 126 U/L 100 120 -  AST 15 - 41 U/L 26 26 -  ALT 0 - 44 U/L 38 43 -       DIAGNOSTIC IMAGING:  I have independently reviewed the scans and discussed with the patient.   ASSESSMENT & PLAN:   Lymphadenopathy 1.  Pelvic lymphadenopathy: - He had a recent hospital admission with cholecystitis. -CTAP on 06/15/2019 showed incidental bilateral external iliac adenopathy measuring up to 3.4 cm on the right side.  There is a 5 x 4 cm soft tissue mass in the superficial soft tissues of the anteromedial right upper thigh.  Multiple mildly enlarged upper abdominal lymph nodes measuring up to 13 mm in short axis in the gastrohepatic space.  Several scattered small nodular density in the upper omentum measure up to 7 mm in short axis. - He denies any fevers, night sweats or weight loss in the last 6 months. 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 Dema Severin count was elevated since 2011, predominantly neutrophils and monocytes. -LDH and beta-2 microglobulin was normal. -CT of the chest showed mediastinal adenopathy and small left axillary adenopathy. -Biopsy of the left axillary lymph node on 07/26/2019 was benign tissue. -We reviewed results of the PET CT scan dated 08/12/2019 which showed lymphadenopathy in the chest, abdomen and pelvis.  Dominant nodes include a 4.2 cm short axis node in the right inferior inguinal region with the highest SUV and a 2.7 cm short axis node in the right paratracheal region. -I have called and talked to Dr. Arnoldo Morale about biopsy of the right inguinal lymph node biopsy. -I will see him back in 5 weeks for follow-up.  2.  Tobacco abuse: -Current active smoker, smokes 1 pack/day for 24 years.  3.  Family history: -Father was diagnosed with metastatic cancer right before his death.  Mother had throat cancer.  Paternal grandfather had stomach cancer.  4.  Leukocytosis: -He has predominantly neutrophilic leukocytosis since 2011. 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   Total time spent is 25 minutes with more than 50% of the time spent face-to-face discussing scan results, pathology report, counseling and coordination of care.  Orders placed this encounter:  No orders of the defined types were placed in this encounter.     Derek Jack, MD [place] [phone]

## 2019-08-14 NOTE — Assessment & Plan Note (Signed)
1.  Pelvic lymphadenopathy: - He had a recent hospital admission with cholecystitis. -CTAP on 06/15/2019 showed incidental bilateral external iliac adenopathy measuring up to 3.4 cm on the right side.  There is a 5 x 4 cm soft tissue mass in the superficial soft tissues of the anteromedial right upper thigh.  Multiple mildly enlarged upper abdominal lymph nodes measuring up to 13 mm in short axis in the gastrohepatic space.  Several scattered small nodular density in the upper omentum measure up to 7 mm in short axis. - He denies any fevers, night sweats or weight loss in the last 6 months. 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 Dema Severin count was elevated since 2011, predominantly neutrophils and monocytes. -LDH and beta-2 microglobulin was normal. -CT of the chest showed mediastinal adenopathy and small left axillary adenopathy. -Biopsy of the left axillary lymph node on 07/26/2019 was benign tissue. -We reviewed results of the PET CT scan dated 08/12/2019 which showed lymphadenopathy in the chest, abdomen and pelvis.  Dominant nodes include a 4.2 cm short axis node in the right inferior inguinal region with the highest SUV and a 2.7 cm short axis node in the right paratracheal region. -I have called and talked to Dr. Arnoldo Morale about biopsy of the right inguinal lymph node biopsy. -I will see him back in 5 weeks for follow-up.  2.  Tobacco abuse: -Current active smoker, smokes 1 pack/day for 24 years.  3.  Family history: -Father was diagnosed with metastatic cancer right before his death.  Mother had throat cancer.  Paternal grandfather had stomach cancer.  4.  Leukocytosis: -He has predominantly neutrophilic leukocytosis since 2011. 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

## 2019-08-14 NOTE — Patient Instructions (Signed)
[place] at [facility] Discharge Instructions  You were seen today by Dr. Delton Coombes. He went over your recent scan results. He will see you back in 5 weeks for follow up.   Thank you for choosing [place] at [facility] to provide your oncology and hematology care.  To afford each patient quality time with our provider, please arrive at least 15 minutes before your scheduled appointment time.   If you have a lab appointment with the [facility] please come in thru the  Main Entrance and check in at the main information desk  You need to re-schedule your appointment should you arrive 10 or more minutes late.  We strive to give you quality time with our providers, and arriving late affects you and other patients whose appointments are after yours.  Also, if you no show three or more times for appointments you may be dismissed from the clinic at the providers discretion.     Again, thank you for choosing [facility].  Our hope is that these requests will decrease the amount of time that you wait before being seen by our physicians.       _____________________________________________________________  Should you have questions after your visit to [facility], please contact our office at [phone] between the hours of 8:00 a.m. and 4:30 p.m.  Voicemails left after 4:00 p.m. will not be returned until the following business day.  For prescription refill requests, have your pharmacy contact our office and allow 72 hours.    Cancer Center Support Programs:   > Cancer Support Group  2nd Tuesday of the month 1pm-2pm, Journey Room

## 2019-08-15 ENCOUNTER — Emergency Department (HOSPITAL_COMMUNITY): Payer: Medicaid Other

## 2019-08-15 ENCOUNTER — Encounter (HOSPITAL_COMMUNITY): Payer: Self-pay | Admitting: Emergency Medicine

## 2019-08-15 ENCOUNTER — Emergency Department (HOSPITAL_COMMUNITY)
Admission: EM | Admit: 2019-08-15 | Discharge: 2019-08-15 | Disposition: A | Discharge status: Home / Self Care | Payer: Medicaid Other | Attending: Emergency Medicine | Admitting: Emergency Medicine

## 2019-08-15 ENCOUNTER — Other Ambulatory Visit: Payer: Self-pay

## 2019-08-15 DIAGNOSIS — K829 Disease of gallbladder, unspecified: Secondary | ICD-10-CM | POA: Diagnosis not present

## 2019-08-15 DIAGNOSIS — Z955 Presence of coronary angioplasty implant and graft: Secondary | ICD-10-CM | POA: Insufficient documentation

## 2019-08-15 DIAGNOSIS — R1010 Upper abdominal pain, unspecified: Secondary | ICD-10-CM | POA: Diagnosis present

## 2019-08-15 DIAGNOSIS — K8050 Calculus of bile duct without cholangitis or cholecystitis without obstruction: Secondary | ICD-10-CM | POA: Diagnosis not present

## 2019-08-15 DIAGNOSIS — J45909 Unspecified asthma, uncomplicated: Secondary | ICD-10-CM | POA: Insufficient documentation

## 2019-08-15 DIAGNOSIS — I2510 Atherosclerotic heart disease of native coronary artery without angina pectoris: Secondary | ICD-10-CM | POA: Diagnosis not present

## 2019-08-15 DIAGNOSIS — Z79899 Other long term (current) drug therapy: Secondary | ICD-10-CM | POA: Insufficient documentation

## 2019-08-15 DIAGNOSIS — F17210 Nicotine dependence, cigarettes, uncomplicated: Secondary | ICD-10-CM | POA: Diagnosis not present

## 2019-08-15 DIAGNOSIS — I252 Old myocardial infarction: Secondary | ICD-10-CM | POA: Insufficient documentation

## 2019-08-15 LAB — CBC WITH DIFFERENTIAL/PLATELET
Abs Immature Granulocytes: 0.03 10*3/uL (ref 0.00–0.07)
Basophils Absolute: 0.1 10*3/uL (ref 0.0–0.1)
Basophils Relative: 1 %
Eosinophils Absolute: 0.4 10*3/uL (ref 0.0–0.5)
Eosinophils Relative: 3 %
HCT: 46.4 % (ref 39.0–52.0)
Hemoglobin: 15.6 g/dL (ref 13.0–17.0)
Immature Granulocytes: 0 %
Lymphocytes Relative: 26 %
Lymphs Abs: 3.8 10*3/uL (ref 0.7–4.0)
MCH: 32.9 pg (ref 26.0–34.0)
MCHC: 33.6 g/dL (ref 30.0–36.0)
MCV: 97.9 fL (ref 80.0–100.0)
Monocytes Absolute: 1.3 10*3/uL — ABNORMAL HIGH (ref 0.1–1.0)
Monocytes Relative: 9 %
Neutro Abs: 8.8 10*3/uL — ABNORMAL HIGH (ref 1.7–7.7)
Neutrophils Relative %: 61 %
Platelets: 329 10*3/uL (ref 150–400)
RBC: 4.74 MIL/uL (ref 4.22–5.81)
RDW: 13.5 % (ref 11.5–15.5)
WBC: 14.4 10*3/uL — ABNORMAL HIGH (ref 4.0–10.5)
nRBC: 0 % (ref 0.0–0.2)

## 2019-08-15 LAB — COMPREHENSIVE METABOLIC PANEL
ALT: 43 U/L (ref 0–44)
AST: 29 U/L (ref 15–41)
Albumin: 3.9 g/dL (ref 3.5–5.0)
Alkaline Phosphatase: 124 U/L (ref 38–126)
Anion gap: 9 (ref 5–15)
BUN: 10 mg/dL (ref 6–20)
CO2: 24 mmol/L (ref 22–32)
Calcium: 9 mg/dL (ref 8.9–10.3)
Chloride: 104 mmol/L (ref 98–111)
Creatinine, Ser: 0.87 mg/dL (ref 0.61–1.24)
GFR calc Af Amer: >60 mL/min (ref >60)
GFR calc non Af Amer: >60 mL/min (ref >60)
Glucose, Bld: 133 mg/dL — ABNORMAL HIGH (ref 70–99)
Potassium: 3.4 mmol/L — ABNORMAL LOW (ref 3.5–5.1)
Sodium: 137 mmol/L (ref 135–145)
Total Bilirubin: 0.3 mg/dL (ref 0.3–1.2)
Total Protein: 7.1 g/dL (ref 6.5–8.1)

## 2019-08-15 LAB — LIPASE, BLOOD: Lipase: 20 U/L (ref 11–51)

## 2019-08-15 MED ORDER — HYDROCODONE-ACETAMINOPHEN 5-325 MG PO TABS: 1.0000 | ORAL_TABLET | ORAL | 0 refills | Status: DC | PRN

## 2019-08-15 MED ORDER — MORPHINE SULFATE (PF) 4 MG/ML IV SOLN
4.0000 mg | Freq: Once | INTRAVENOUS | Status: AC
  Administered 2019-08-15: 4 mg via INTRAVENOUS
  Filled 2019-08-15: qty 1

## 2019-08-15 MED ORDER — ONDANSETRON HCL 4 MG/2ML IJ SOLN
4.0000 mg | Freq: Once | INTRAMUSCULAR | Status: AC
  Administered 2019-08-15: 4 mg via INTRAVENOUS
  Filled 2019-08-15: qty 2

## 2019-08-15 MED ORDER — SODIUM CHLORIDE 0.9 % IV BOLUS
1000.0000 mL | Freq: Once | INTRAVENOUS | Status: AC
  Administered 2019-08-15: 07:00:00 1000 mL via INTRAVENOUS

## 2019-08-15 NOTE — ED Provider Notes (Signed)
10:55 AM-after second dose of morphine, he is comfortable.  I discussed the findings with him.  I discussed the case with the general surgeon, Dr. Constance Haw who recommends outpatient follow-up with his general surgeon.  Patient is comfortable with this plan.  He is not currently employed.  Prescription sent to his pharmacy for pain reliever.  He is instructed on use of low-fat diet to avoid gallbladder symptoms.   Daleen Bo, MD 08/15/19 1057

## 2019-08-15 NOTE — Discharge Instructions (Addendum)
Follow-up with diet recommended, for gallbladder problems.  Make sure you are drinking plenty of fluids, especially water.  Call your surgeon for follow-up care and treatment.

## 2019-08-15 NOTE — ED Provider Notes (Signed)
[facility] EMERGENCY DEPARTMENT Provider Note   CSN: [id number] Arrival date & time: 08/15/19  [id number]    History   Chief Complaint Chief Complaint  Patient presents with  . Abdominal Pain    HPI Nathan Waters is a 38 y.o. male.   The history is provided by the patient.  He has history of coronary artery disease, hypertension, hyperlipidemia and comes in complaining of upper abdominal pain.  He had a similar episode 2 months ago and was found to have a gallstone.  He states at this time pain is worse.  Pain started about 4 AM, several hours after eating a high-fat meal that included cheese, hamburger, sausage, spaghetti sauce.  Pain is across the upper abdomen and radiates into the back.  He rates it at 10/10.  Nothing makes it better, nothing makes it worse.  He has not taken anything for pain at home.  Of note, he is also currently being worked up for possible lymphoma.  Past Medical History:  Diagnosis Date  . Asthma   . Coronary atherosclerosis of native coronary artery    BMS to mid circumflex 11/2011, LVEF 55-60%  . Essential hypertension, benign   . Migraine   . Mixed hyperlipidemia   . STEMI (ST elevation myocardial infarction) ([place])    Complicated by VF arrest 11/2011    Patient Active Problem List   Diagnosis Date Noted  . Lymphadenopathy, generalized   . Lymphadenopathy 06/27/2019  . Cholecystitis 06/15/2019  . Tobacco use 04/09/2012  . Coronary atherosclerosis of native coronary artery 01/06/2012  . Mixed hyperlipidemia 01/06/2012  . Asthma 11/21/2011    Past Surgical History:  Procedure Laterality Date  . AXILLARY LYMPH NODE BIOPSY Left 07/26/2019   Procedure: AXILLARY LYMPH NODE BIOPSY;  Surgeon: Aviva Signs, MD;  Location: [facility];  Service: General;  Laterality: Left;  . BACK SURGERY    . CARDIAC CATHETERIZATION    . LEFT HEART CATHETERIZATION WITH CORONARY ANGIOGRAM N/A 11/21/2011   Procedure: LEFT HEART CATHETERIZATION WITH CORONARY ANGIOGRAM;   Surgeon: Peter M Martinique, MD;  Location: [facility] CATH LAB;  Service: Cardiovascular;  Laterality: N/A;  . PERCUTANEOUS CORONARY STENT INTERVENTION (PCI-S) N/A 11/21/2011   Procedure: PERCUTANEOUS CORONARY STENT INTERVENTION (PCI-S);  Surgeon: Peter M Martinique, MD;  Location: [facility] CATH LAB;  Service: Cardiovascular;  Laterality: N/A;  . SPLENECTOMY          Home Medications    Prior to Admission medications   Medication Sig Start Date End Date Taking? Authorizing Provider  albuterol (PROVENTIL HFA;VENTOLIN HFA) 108 (90 Base) MCG/ACT inhaler Inhale 2 puffs into the lungs every 6 (six) hours as needed for wheezing or shortness of breath.     [provider]  cetirizine (ZYRTEC) 10 MG tablet Take 10 mg by mouth daily.    [provider]  nitroGLYCERIN (NITROSTAT) 0.4 MG SL tablet Place 1 tablet (0.4 mg total) under the tongue every 5 (five) minutes as needed for chest pain. Patient not taking: Reported on 08/01/2019 04/27/17   Satira Sark, MD    Family History Family History  Problem Relation Age of Onset  . Malignant hyperthermia Mother   . Cancer Mother   . Malignant hyperthermia Brother   . Heart disease Brother   . Asthma Brother   . COPD Brother   . Heart disease Father   . Cancer Father   . ADD / ADHD Daughter   . ADD / ADHD Son     Social History Social  History   Tobacco Use  . Smoking status: Current Every Day Smoker    Packs/day: 1.00    Types: Cigarettes  . Smokeless tobacco: Never Used  Substance Use Topics  . Alcohol use: Yes    Comment: Occasionally  . Drug use: No     Allergies   Penicillins   Review of Systems Review of Systems  All other systems reviewed and are negative.    Physical Exam Updated Vital Signs BP (!) 142/93 (BP Location: Right Arm)   Pulse 78   Temp 98.2 F (36.8 C) (Oral)   Resp 19   Wt 121.1 kg   SpO2 94%   BMI 37.24 kg/m   Physical Exam Vitals signs and nursing note reviewed.    38 year old male,  resting comfortably and in no acute distress. Vital signs are significant for elevated blood pressure. Oxygen saturation is 94%, which is normal. Head is normocephalic and atraumatic. PERRLA, EOMI. Oropharynx is clear. Neck is nontender and supple without adenopathy or JVD. Back is nontender and there is no CVA tenderness. Lungs are clear without rales, wheezes, or rhonchi. Chest is nontender. Heart has regular rate and rhythm without murmur. Abdomen is soft, flat, with moderate right upper quadrant tenderness.  There is no rebound or guarding.  There are no masses or hepatosplenomegaly and peristalsis is hypoactive. Extremities have no cyanosis or edema, full range of motion is present. Skin is warm and dry without rash. Neurologic: Mental status is normal, cranial nerves are intact, there are no motor or sensory deficits.  ED Treatments / Results  Labs (all labs ordered are listed, but only abnormal results are displayed) Labs Reviewed  COMPREHENSIVE METABOLIC PANEL  LIPASE, BLOOD  CBC WITH DIFFERENTIAL/PLATELET     Radiology No results found.  Procedures Procedures  Medications Ordered in ED Medications  ondansetron (ZOFRAN) injection 4 mg (has no administration in time range)  sodium chloride 0.9 % bolus 1,000 mL (has no administration in time range)  morphine 4 MG/ML injection 4 mg (has no administration in time range)     Initial Impression / Assessment and Plan / ED Course  I have reviewed the triage vital signs and the nursing notes.  Pertinent labs & imaging results that were available during my care of the patient were reviewed by me and considered in my medical decision making (see chart for details).  Abdominal pain consistent with biliary colic.  Old records are reviewed confirming CT of abdomen and ultrasound of abdomen on August 29 showing large gallstone.  PET scan 3 days ago suspicious for lymphoma, but axillary node biopsy on October 9 showed no evidence of  malignancy.  Will check screening labs and give IV fluids and morphine.  Will repeat right upper quadrant ultrasound to look for evidence of cholelithiasis.  Case is signed out to Dr. Eulis Foster.  Final Clinical Impressions(s) / ED Diagnoses   Final diagnoses:  Biliary colic    ED Discharge Orders    None       Delora Fuel, MD [phone]

## 2019-08-15 NOTE — ED Triage Notes (Signed)
Pt c/o generalized abd pain and denies any n/v/d.

## 2019-08-22 ENCOUNTER — Other Ambulatory Visit: Payer: Self-pay

## 2019-08-22 ENCOUNTER — Ambulatory Visit (INDEPENDENT_AMBULATORY_CARE_PROVIDER_SITE_OTHER): Payer: Medicaid Other | Admitting: General Surgery

## 2019-08-22 ENCOUNTER — Encounter: Payer: Self-pay | Admitting: General Surgery

## 2019-08-22 VITALS — BP 107/76 | HR 82 | Temp 97.3°F | Resp 16 | Ht 71.0 in | Wt 273.0 lb

## 2019-08-22 DIAGNOSIS — K8020 Calculus of gallbladder without cholecystitis without obstruction: Secondary | ICD-10-CM

## 2019-08-22 NOTE — Progress Notes (Signed)
Subjective:     Nathan Waters  Patient returns for follow-up of lymphadenopathy.  His left axillary lymph node biopsy was negative.  Oncology would like a biopsy of the right inguinal lymph node now.  In addition, he has had 2 episodes of biliary colic secondary to known cholelithiasis.  He was seen in the emergency room this last visit.  He denies any fever or chills currently.  He does have some left sided neck pain. Objective:    BP 107/76 (BP Location: Left Arm, Patient Position: Sitting, Cuff Size: Normal)   Pulse 82   Temp (!) 97.3 F (36.3 C) (Oral)   Resp 16   Ht 5\' 11"  (1.803 m)   Wt 273 lb (123.8 kg)   SpO2 92%   BMI 38.08 kg/m   General:  alert, cooperative and no distress  Head is normocephalic, atraumatic.  Shotty lymphadenopathy is noted in the left submandibular region. Eyes are without scleral icterus Lungs clear to auscultation with your breath sounds bilaterally Heart examination reveals regular rate and rhythm without S3, S4, murmurs Abdomen is soft with minimal tenderness in the right upper quadrant to palpation.  No hepatosplenomegaly, masses, hernias identified Right inguinal region with a 4 to 5 cm ovoid subcutaneous mass present just below the groin crease.     Assessment:    Biliary colic secondary to cholelithiasis   Generalized lymphadenopathy, suspicious for malignancy   Plan:  Patient is scheduled for laparoscopic cholecystectomy, right inguinal lymph node biopsy on 08/26/2019.  The risks and benefits of both procedures including bleeding, infection, hepatobiliary injury, the possibility of an open procedure were fully explained to the patient, who gave informed consent.

## 2019-08-22 NOTE — Patient Instructions (Signed)
Laparoscopic Cholecystectomy Laparoscopic cholecystectomy is surgery to remove the gallbladder. The gallbladder is a pear-shaped organ that lies beneath the liver on the right side of the body. The gallbladder stores bile, which is a fluid that helps the body to digest fats. Cholecystectomy is often done for inflammation of the gallbladder (cholecystitis). This condition is usually caused by a buildup of gallstones (cholelithiasis) in the gallbladder. Gallstones can block the flow of bile, which can result in inflammation and pain. In severe cases, emergency surgery may be required. This procedure is done though small incisions in your abdomen (laparoscopic surgery). A thin scope with a camera (laparoscope) is inserted through one incision. Thin surgical instruments are inserted through the other incisions. In some cases, a laparoscopic procedure may be turned into a type of surgery that is done through a larger incision (open surgery). Tell a health care provider about:  Any allergies you have.  All medicines you are taking, including vitamins, herbs, eye drops, creams, and over-the-counter medicines.  Any problems you or family members have had with anesthetic medicines.  Any blood disorders you have.  Any surgeries you have had.  Any medical conditions you have.  Whether you are pregnant or may be pregnant. What are the risks? Generally, this is a safe procedure. However, problems may occur, including:  Infection.  Bleeding.  Allergic reactions to medicines.  Damage to other structures or organs.  A stone remaining in the common bile duct. The common bile duct carries bile from the gallbladder into the small intestine.  A bile leak from the cyst duct that is clipped when your gallbladder is removed. What happens before the procedure?   Medicines  Ask your health care provider about: ? Changing or stopping your regular medicines. This is especially important if you are taking  diabetes medicines or blood thinners. ? Taking medicines such as aspirin and ibuprofen. These medicines can thin your blood. Do not take these medicines before your procedure if your health care provider instructs you not to.  You may be given antibiotic medicine to help prevent infection. General instructions  Let your health care provider know if you develop a cold or an infection before surgery.  Plan to have someone take you home from the hospital or clinic.  Ask your health care provider how your surgical site will be marked or identified. What happens during the procedure?   To reduce your risk of infection: ? Your health care team will wash or sanitize their hands. ? Your skin will be washed with soap. ? Hair may be removed from the surgical area.  An IV tube may be inserted into one of your veins.  You will be given one or more of the following: ? A medicine to help you relax (sedative). ? A medicine to make you fall asleep (general anesthetic).  A breathing tube will be placed in your mouth.  Your surgeon will make several small cuts (incisions) in your abdomen.  The laparoscope will be inserted through one of the small incisions. The camera on the laparoscope will send images to a TV screen (monitor) in the operating room. This lets your surgeon see inside your abdomen.  Air-like gas will be pumped into your abdomen. This will expand your abdomen to give the surgeon more room to perform the surgery.  Other tools that are needed for the procedure will be inserted through the other incisions. The gallbladder will be removed through one of the incisions.  Your common bile duct   may be examined. If stones are found in the common bile duct, they may be removed.  After your gallbladder has been removed, the incisions will be closed with stitches (sutures), staples, or skin glue.  Your incisions may be covered with a bandage (dressing). The procedure may vary among health  care providers and hospitals. What happens after the procedure?  Your blood pressure, heart rate, breathing rate, and blood oxygen level will be monitored until the medicines you were given have worn off.  You will be given medicines as needed to control your pain.  Do not drive for 24 hours if you were given a sedative. This information is not intended to replace advice given to you by your health care provider. Make sure you discuss any questions you have with your health care provider. Document Released: 10/03/2005 Document Revised: 09/15/2017 Document Reviewed: 03/21/2016 Elsevier Patient Education  2020 [employer]vier Inc[employer]  

## 2019-08-22 NOTE — H&P (Signed)
Nathan Waters is an 38 y.o. male.   Chief Complaint: Generalized lymphadenopathy HPI: Patient is a 38 year old white male who presents for a right inguinal lymph node biopsy as well as biliary colic secondary to cholelithiasis.  Patient is status post a left axillary lymph node biopsy which was negative.  Patient does have generalized lymphadenopathy and oncology would like another biopsy performed due to the high likelihood of malignancy.  He is also been having episodes of biliary colic.  He was last seen in the emergency room within the last week with right upper quadrant abdominal pain and nausea.  He does have a known history of cholelithiasis.  He currently denies any chills, or jaundice.  He has 2 out of 10 right upper quadrant abdominal pain.  He denies any fatty food intolerance.     Past Medical History:  Diagnosis Date  . Asthma   . Coronary atherosclerosis of native coronary artery    BMS to mid circumflex 11/2011, LVEF 55-60%  . Essential hypertension, benign   . Migraine   . Mixed hyperlipidemia   . STEMI (ST elevation myocardial infarction) ([place])    Complicated by VF arrest 11/2011         Past Surgical History:  Procedure Laterality Date  . BACK SURGERY    . CARDIAC CATHETERIZATION    . LEFT HEART CATHETERIZATION WITH CORONARY ANGIOGRAM N/A 11/21/2011   Procedure: LEFT HEART CATHETERIZATION WITH CORONARY ANGIOGRAM;  Surgeon: Peter M Martinique, MD;  Location: [facility] CATH LAB;  Service: Cardiovascular;  Laterality: N/A;  . PERCUTANEOUS CORONARY STENT INTERVENTION (PCI-S) N/A 11/21/2011   Procedure: PERCUTANEOUS CORONARY STENT INTERVENTION (PCI-S);  Surgeon: Peter M Martinique, MD;  Location: [facility] CATH LAB;  Service: Cardiovascular;  Laterality: N/A;  . SPLENECTOMY           Family History  Problem Relation Age of Onset  . Malignant hyperthermia Mother   . Cancer Mother   . Malignant hyperthermia Brother   . Heart disease Brother   . Asthma Brother   . COPD  Brother   . Heart disease Father   . Cancer Father   . ADD / ADHD Daughter   . ADD / ADHD Son    Social History:  reports that he has been smoking cigarettes. He has been smoking about 1.00 pack per day. He has never used smokeless tobacco. He reports current alcohol use. He reports that he does not use drugs.  Allergies:       Allergies  Allergen Reactions  . Penicillins Anaphylaxis    Did it involve swelling of the face/tongue/throat, SOB, or low BP? Yes Did it involve sudden or severe rash/hives, skin peeling, or any reaction on the inside of your mouth or nose? No Did you need to seek medical attention at a hospital or doctor's office? Yes When did it last happen?Childhood allergy If all above answers are "NO", may proceed with cephalosporin use.     No medications prior to admission.    Lab Results Last 48 Hours  No results found for this or any previous visit (from the past 48 hour(s)).   Imaging Results (Last 48 hours)  No results found.    Review of Systems  Constitutional: Negative.   HENT: Negative.   Eyes: Negative.   Respiratory: Negative.   Cardiovascular: Negative.   Gastrointestinal: Negative.   Genitourinary: Negative.   Musculoskeletal: Negative.   Skin: Negative.   Neurological: Negative.   Endo/Heme/Allergies: Negative.   Psychiatric/Behavioral: Negative.  There were no vitals taken for this visit. Physical Exam  Vitals reviewed. Constitutional: He is oriented to person, place, and time. He appears well-developed and well-nourished. No distress.  HENT:  Head: Normocephalic and atraumatic.  Cardiovascular: Normal rate, regular rhythm and normal heart sounds. Exam reveals no gallop and no friction rub.  No murmur heard. Respiratory: Effort normal and breath sounds normal. No respiratory distress. He has no wheezes. He has no rales.  GI: Soft. Bowel sounds are normal. He exhibits no distension. There is no abdominal  tenderness. There is no rebound and no guarding.  Neurological: He is alert and oriented to person, place, and time.  Skin: Skin is warm and dry.  Shotty left axillary lymphadenopathy.  No inguinal lymphadenopathy, though a soft tissue mass is noted inferior to the groin in the right thigh.  Assessment/Plan Impression: Generalized lymphadenopathy, rule out lymphoma Biliary colic, cholelithiasis Plan: Patient is scheduled to undergo a laparoscopic cholecystectomy, right inguinal lymph node biopsy on 08/26/2019.  The risks and benefits of the procedure including bleeding, infection, hepatobiliary injury, and the possibility of an open procedure were fully explained to the patient, who gave informed consent.

## 2019-08-23 ENCOUNTER — Other Ambulatory Visit (HOSPITAL_COMMUNITY)
Admission: RE | Admit: 2019-08-23 | Discharge: 2019-08-23 | Disposition: A | Discharge status: Home / Self Care | Payer: Medicaid Other | Source: Ambulatory Visit | Attending: General Surgery | Admitting: General Surgery

## 2019-08-23 ENCOUNTER — Encounter (HOSPITAL_COMMUNITY)
Admission: RE | Admit: 2019-08-23 | Discharge: 2019-08-23 | Disposition: A | Discharge status: Home / Self Care | Payer: Medicaid Other | Source: Ambulatory Visit | Attending: General Surgery | Admitting: General Surgery

## 2019-08-23 DIAGNOSIS — Z01812 Encounter for preprocedural laboratory examination: Secondary | ICD-10-CM | POA: Insufficient documentation

## 2019-08-23 DIAGNOSIS — Z20828 Contact with and (suspected) exposure to other viral communicable diseases: Secondary | ICD-10-CM | POA: Insufficient documentation

## 2019-08-23 LAB — SARS CORONAVIRUS 2 (TAT 6-24 HRS): SARS Coronavirus 2: NEGATIVE

## 2019-08-26 ENCOUNTER — Ambulatory Visit (HOSPITAL_COMMUNITY): Payer: Medicaid Other | Admitting: Anesthesiology

## 2019-08-26 ENCOUNTER — Encounter (HOSPITAL_COMMUNITY)
Admission: RE | Disposition: A | Discharge status: Home / Self Care | Payer: Self-pay | Source: Home / Self Care | Attending: General Surgery

## 2019-08-26 ENCOUNTER — Ambulatory Visit (HOSPITAL_COMMUNITY)
Admission: RE | Admit: 2019-08-26 | Discharge: 2019-08-26 | Disposition: A | Discharge status: Home / Self Care | Payer: Medicaid Other | Attending: General Surgery | Admitting: General Surgery

## 2019-08-26 ENCOUNTER — Encounter (HOSPITAL_COMMUNITY): Payer: Self-pay

## 2019-08-26 ENCOUNTER — Other Ambulatory Visit: Payer: Self-pay

## 2019-08-26 DIAGNOSIS — K8064 Calculus of gallbladder and bile duct with chronic cholecystitis without obstruction: Secondary | ICD-10-CM | POA: Insufficient documentation

## 2019-08-26 DIAGNOSIS — J45909 Unspecified asthma, uncomplicated: Secondary | ICD-10-CM | POA: Diagnosis not present

## 2019-08-26 DIAGNOSIS — Z8249 Family history of ischemic heart disease and other diseases of the circulatory system: Secondary | ICD-10-CM | POA: Diagnosis not present

## 2019-08-26 DIAGNOSIS — E782 Mixed hyperlipidemia: Secondary | ICD-10-CM | POA: Insufficient documentation

## 2019-08-26 DIAGNOSIS — Z880 Allergy status to penicillin: Secondary | ICD-10-CM | POA: Insufficient documentation

## 2019-08-26 DIAGNOSIS — R591 Generalized enlarged lymph nodes: Secondary | ICD-10-CM | POA: Diagnosis not present

## 2019-08-26 DIAGNOSIS — K8020 Calculus of gallbladder without cholecystitis without obstruction: Secondary | ICD-10-CM

## 2019-08-26 DIAGNOSIS — I2510 Atherosclerotic heart disease of native coronary artery without angina pectoris: Secondary | ICD-10-CM | POA: Insufficient documentation

## 2019-08-26 DIAGNOSIS — F17210 Nicotine dependence, cigarettes, uncomplicated: Secondary | ICD-10-CM | POA: Diagnosis not present

## 2019-08-26 DIAGNOSIS — C8105 Nodular lymphocyte predominant Hodgkin lymphoma, lymph nodes of inguinal region and lower limb: Secondary | ICD-10-CM | POA: Insufficient documentation

## 2019-08-26 DIAGNOSIS — I10 Essential (primary) hypertension: Secondary | ICD-10-CM | POA: Diagnosis not present

## 2019-08-26 DIAGNOSIS — Z8674 Personal history of sudden cardiac arrest: Secondary | ICD-10-CM | POA: Diagnosis not present

## 2019-08-26 DIAGNOSIS — I252 Old myocardial infarction: Secondary | ICD-10-CM | POA: Diagnosis not present

## 2019-08-26 DIAGNOSIS — Z955 Presence of coronary angioplasty implant and graft: Secondary | ICD-10-CM | POA: Diagnosis not present

## 2019-08-26 HISTORY — PX: LYMPH NODE BIOPSY: SHX201

## 2019-08-26 HISTORY — PX: CHOLECYSTECTOMY: SHX55

## 2019-08-26 SURGERY — LAPAROSCOPIC CHOLECYSTECTOMY
Anesthesia: General | Site: Inguinal | Laterality: Right

## 2019-08-26 MED ORDER — FENTANYL CITRATE (PF) 100 MCG/2ML IJ SOLN
INTRAMUSCULAR | Status: DC | PRN
  Administered 2019-08-26 (×10): 50 ug via INTRAVENOUS

## 2019-08-26 MED ORDER — SODIUM CHLORIDE FLUSH 0.9 % IV SOLN
INTRAVENOUS | Status: AC
  Filled 2019-08-26: qty 10

## 2019-08-26 MED ORDER — LIDOCAINE HCL (CARDIAC) PF 50 MG/5ML IV SOSY
PREFILLED_SYRINGE | INTRAVENOUS | Status: DC | PRN
  Administered 2019-08-26: 80 mg via INTRAVENOUS

## 2019-08-26 MED ORDER — MIDAZOLAM HCL 2 MG/2ML IJ SOLN
INTRAMUSCULAR | Status: AC
  Filled 2019-08-26: qty 2

## 2019-08-26 MED ORDER — FENTANYL CITRATE (PF) 250 MCG/5ML IJ SOLN
INTRAMUSCULAR | Status: AC
  Filled 2019-08-26: qty 5

## 2019-08-26 MED ORDER — VANCOMYCIN HCL 10 G IV SOLR
1500.0000 mg | INTRAVENOUS | Status: AC
  Administered 2019-08-26: 1500 mg via INTRAVENOUS
  Filled 2019-08-26: qty 1500

## 2019-08-26 MED ORDER — HYDROMORPHONE HCL 1 MG/ML IJ SOLN
0.2500 mg | INTRAMUSCULAR | Status: DC | PRN
  Administered 2019-08-26 (×3): 0.5 mg via INTRAVENOUS
  Filled 2019-08-26 (×3): qty 0.5

## 2019-08-26 MED ORDER — HYDROCODONE-ACETAMINOPHEN 5-325 MG PO TABS: 1.0000 | ORAL_TABLET | ORAL | 0 refills | Status: DC | PRN

## 2019-08-26 MED ORDER — PROPOFOL 10 MG/ML IV BOLUS
INTRAVENOUS | Status: DC | PRN
  Administered 2019-08-26: 200 mg via INTRAVENOUS

## 2019-08-26 MED ORDER — ROCURONIUM BROMIDE 10 MG/ML (PF) SYRINGE
PREFILLED_SYRINGE | INTRAVENOUS | Status: AC
  Filled 2019-08-26: qty 20

## 2019-08-26 MED ORDER — CHLORHEXIDINE GLUCONATE CLOTH 2 % EX PADS: 6.0000 | MEDICATED_PAD | Freq: Once | CUTANEOUS | Status: DC

## 2019-08-26 MED ORDER — HEMOSTATIC AGENTS (NO CHARGE) OPTIME
TOPICAL | Status: DC | PRN
  Administered 2019-08-26 (×2): 1 via TOPICAL

## 2019-08-26 MED ORDER — SUCCINYLCHOLINE CHLORIDE 200 MG/10ML IV SOSY
PREFILLED_SYRINGE | INTRAVENOUS | Status: AC
  Filled 2019-08-26: qty 10

## 2019-08-26 MED ORDER — SODIUM CHLORIDE 0.9 % IR SOLN
Status: DC | PRN
  Administered 2019-08-26: 1000 mL

## 2019-08-26 MED ORDER — BUPIVACAINE LIPOSOME 1.3 % IJ SUSP
INTRAMUSCULAR | Status: DC | PRN
  Administered 2019-08-26: 20 mL
  Administered 2019-08-26: 15 mL

## 2019-08-26 MED ORDER — LACTATED RINGERS IV SOLN
INTRAVENOUS | Status: DC
  Administered 2019-08-26: 07:00:00 via INTRAVENOUS

## 2019-08-26 MED ORDER — SUGAMMADEX SODIUM 200 MG/2ML IV SOLN
INTRAVENOUS | Status: DC | PRN
  Administered 2019-08-26: 250 mg via INTRAVENOUS

## 2019-08-26 MED ORDER — BUPIVACAINE LIPOSOME 1.3 % IJ SUSP
INTRAMUSCULAR | Status: AC
  Filled 2019-08-26: qty 20

## 2019-08-26 MED ORDER — MIDAZOLAM HCL 5 MG/5ML IJ SOLN
INTRAMUSCULAR | Status: DC | PRN
  Administered 2019-08-26: 2 mg via INTRAVENOUS

## 2019-08-26 MED ORDER — POVIDONE-IODINE 10 % OINT PACKET
TOPICAL_OINTMENT | CUTANEOUS | Status: DC | PRN
  Administered 2019-08-26: 1 via TOPICAL

## 2019-08-26 MED ORDER — MIDAZOLAM HCL 2 MG/2ML IJ SOLN: 0.5000 mg | Freq: Once | INTRAMUSCULAR | Status: DC | PRN

## 2019-08-26 MED ORDER — SUCCINYLCHOLINE 20MG/ML (10ML) SYRINGE FOR MEDFUSION PUMP - OPTIME
INTRAMUSCULAR | Status: DC | PRN
  Administered 2019-08-26: 140 mg via INTRAVENOUS

## 2019-08-26 MED ORDER — PROPOFOL 10 MG/ML IV BOLUS
INTRAVENOUS | Status: AC
  Filled 2019-08-26: qty 40

## 2019-08-26 MED ORDER — ROCURONIUM 10MG/ML (10ML) SYRINGE FOR MEDFUSION PUMP - OPTIME
INTRAVENOUS | Status: DC | PRN
  Administered 2019-08-26: 10 mg via INTRAVENOUS
  Administered 2019-08-26: 30 mg via INTRAVENOUS

## 2019-08-26 MED ORDER — KETOROLAC TROMETHAMINE 30 MG/ML IJ SOLN
30.0000 mg | Freq: Once | INTRAMUSCULAR | Status: AC
  Administered 2019-08-26: 30 mg via INTRAVENOUS
  Filled 2019-08-26: qty 1

## 2019-08-26 MED ORDER — HYDROCODONE-ACETAMINOPHEN 7.5-325 MG PO TABS: 1.0000 | ORAL_TABLET | Freq: Once | ORAL | Status: DC | PRN

## 2019-08-26 MED ORDER — POVIDONE-IODINE 10 % EX OINT
TOPICAL_OINTMENT | CUTANEOUS | Status: AC
  Filled 2019-08-26: qty 1

## 2019-08-26 MED ORDER — PROMETHAZINE HCL 25 MG/ML IJ SOLN
6.2500 mg | INTRAMUSCULAR | Status: DC | PRN
  Administered 2019-08-26: 12.5 mg via INTRAVENOUS
  Filled 2019-08-26: qty 1

## 2019-08-26 SURGICAL SUPPLY — 56 items
APPLICATOR ARISTA FLEXITIP XL (MISCELLANEOUS) ×3 IMPLANT
APPLIER CLIP ROT 10 11.4 M/L (STAPLE) ×3
BAG RETRIEVAL 10 (BASKET) ×1
BLADE 15 SAFETY STRL DISP (BLADE) ×3 IMPLANT
BLADE HEX COATED 2.75 (ELECTRODE) ×3 IMPLANT
CHLORAPREP W/TINT 26 (MISCELLANEOUS) ×3 IMPLANT
CLIP APPLIE ROT 10 11.4 M/L (STAPLE) ×2 IMPLANT
CLOTH BEACON ORANGE TIMEOUT ST (SAFETY) ×3 IMPLANT
CONT SPEC 4OZ CLIKSEAL STRL BL (MISCELLANEOUS) ×3 IMPLANT
COVER LIGHT HANDLE STERIS (MISCELLANEOUS) ×6 IMPLANT
COVER WAND RF STERILE (DRAPES) ×3 IMPLANT
CUTTER FLEX LINEAR 45M (STAPLE) ×3 IMPLANT
DERMABOND ADVANCED (GAUZE/BANDAGES/DRESSINGS) ×1
DERMABOND ADVANCED .7 DNX12 (GAUZE/BANDAGES/DRESSINGS) ×2 IMPLANT
ELECT REM PT RETURN 9FT ADLT (ELECTROSURGICAL) ×3
ELECTRODE REM PT RTRN 9FT ADLT (ELECTROSURGICAL) ×2 IMPLANT
FILTER SMOKE EVAC LAPAROSHD (FILTER) ×3 IMPLANT
FORMALIN 10 PREFIL 120ML (MISCELLANEOUS) ×3 IMPLANT
GLOVE BIO SURGEON STRL SZ7 (GLOVE) ×9 IMPLANT
GLOVE BIOGEL PI IND STRL 7.0 (GLOVE) ×8 IMPLANT
GLOVE BIOGEL PI INDICATOR 7.0 (GLOVE) ×4
GLOVE SURG SS PI 7.5 STRL IVOR (GLOVE) ×3 IMPLANT
GOWN STRL REUS W/TWL LRG LVL3 (GOWN DISPOSABLE) ×12 IMPLANT
HEMOSTAT ARISTA ABSORB 3G PWDR (HEMOSTASIS) ×3 IMPLANT
HEMOSTAT SNOW SURGICEL 2X4 (HEMOSTASIS) ×3 IMPLANT
INST SET LAPROSCOPIC AP (KITS) ×3 IMPLANT
KIT TURNOVER KIT A (KITS) ×3 IMPLANT
MANIFOLD NEPTUNE II (INSTRUMENTS) ×3 IMPLANT
NEEDLE HYPO 18GX1.5 BLUNT FILL (NEEDLE) ×3 IMPLANT
NEEDLE HYPO 22GX1.5 SAFETY (NEEDLE) ×3 IMPLANT
NEEDLE HYPO 25X1 1.5 SAFETY (NEEDLE) IMPLANT
NEEDLE INSUFFLATION 14GA 120MM (NEEDLE) ×3 IMPLANT
NS IRRIG 1000ML POUR BTL (IV SOLUTION) ×3 IMPLANT
PACK LAP CHOLE LZT030E (CUSTOM PROCEDURE TRAY) ×3 IMPLANT
PAD ARMBOARD 7.5X6 YLW CONV (MISCELLANEOUS) ×3 IMPLANT
PENCIL HANDSWITCHING (ELECTRODE) ×3 IMPLANT
RELOAD STAPLE TA45 3.5 REG BLU (ENDOMECHANICALS) ×3 IMPLANT
SET BASIN LINEN APH (SET/KITS/TRAYS/PACK) ×3 IMPLANT
SET TUBE SMOKE EVAC HIGH FLOW (TUBING) ×3 IMPLANT
SLEEVE ENDOPATH XCEL 5M (ENDOMECHANICALS) ×3 IMPLANT
SPONGE GAUZE 2X2 8PLY STRL LF (GAUZE/BANDAGES/DRESSINGS) ×12 IMPLANT
STAPLER VISISTAT (STAPLE) ×3 IMPLANT
SUT MNCRL AB 4-0 PS2 18 (SUTURE) ×6 IMPLANT
SUT VIC AB 3-0 SH 27 (SUTURE) ×2
SUT VIC AB 3-0 SH 27X BRD (SUTURE) ×4 IMPLANT
SUT VICRYL 0 UR6 27IN ABS (SUTURE) ×3 IMPLANT
SYR 20ML LL LF (SYRINGE) ×9 IMPLANT
SYS BAG RETRIEVAL 10MM (BASKET) ×2
SYSTEM BAG RETRIEVAL 10MM (BASKET) ×2 IMPLANT
TAPE CLOTH SURG 4X10 WHT LF (GAUZE/BANDAGES/DRESSINGS) ×3 IMPLANT
TROCAR ENDO BLADELESS 11MM (ENDOMECHANICALS) ×3 IMPLANT
TROCAR XCEL NON-BLD 5MMX100MML (ENDOMECHANICALS) ×3 IMPLANT
TROCAR XCEL UNIV SLVE 11M 100M (ENDOMECHANICALS) ×3 IMPLANT
TUBE CONNECTING 12X1/4 (SUCTIONS) ×3 IMPLANT
WARMER LAPAROSCOPE (MISCELLANEOUS) ×3 IMPLANT
YANKAUER SUCT BULB TIP 10FT TU (MISCELLANEOUS) ×3 IMPLANT

## 2019-08-26 NOTE — Discharge Instructions (Signed)
Laparoscopic Cholecystectomy, Care After °This sheet gives you information about how to care for yourself after your procedure. Your health care provider may also give you more specific instructions. If you have problems or questions, contact your health care provider. °What can I expect after the procedure? °After the procedure, it is common to have: °· Pain at your incision sites. You will be given medicines to control this pain. °· Mild nausea or vomiting. °· Bloating and possible shoulder pain from the air-like gas that was used during the procedure. °Follow these instructions at home: °Incision care ° °· Follow instructions from your health care provider about how to take care of your incisions. Make sure you: °? Wash your hands with soap and water before you change your bandage (dressing). If soap and water are not available, use hand sanitizer. °? Change your dressing as told by your health care provider. °? Leave stitches (sutures), skin glue, or adhesive strips in place. These skin closures may need to be in place for 2 weeks or longer. If adhesive strip edges start to loosen and curl up, you may trim the loose edges. Do not remove adhesive strips completely unless your health care provider tells you to do that. °· Do not take baths, swim, or use a hot tub until your health care provider approves. Ask your health care provider if you can take showers. You may only be allowed to take sponge baths for bathing. °· Check your incision area every day for signs of infection. Check for: °? More redness, swelling, or pain. °? More fluid or blood. °? Warmth. °? Pus or a bad smell. °Activity °· Do not drive or use heavy machinery while taking prescription pain medicine. °· Do not lift anything that is heavier than 10 lb (4.5 kg) until your health care provider approves. °· Do not play contact sports until your health care provider approves. °· Do not drive for 24 hours if you were given a medicine to help you relax  (sedative). °· Rest as needed. Do not return to work or school until your health care provider approves. °General instructions °· Take over-the-counter and prescription medicines only as told by your health care provider. °· To prevent or treat constipation while you are taking prescription pain medicine, your health care provider may recommend that you: °? Drink enough fluid to keep your urine clear or pale yellow. °? Take over-the-counter or prescription medicines. °? Eat foods that are high in fiber, such as fresh fruits and vegetables, whole grains, and beans. °? Limit foods that are high in fat and processed sugars, such as fried and sweet foods. °Contact a health care provider if: °· You develop a rash. °· You have more redness, swelling, or pain around your incisions. °· You have more fluid or blood coming from your incisions. °· Your incisions feel warm to the touch. °· You have pus or a bad smell coming from your incisions. °· You have a fever. °· One or more of your incisions breaks open. °Get help right away if: °· You have trouble breathing. °· You have chest pain. °· You have increasing pain in your shoulders. °· You faint or feel dizzy when you stand. °· You have severe pain in your abdomen. °· You have nausea or vomiting that lasts for more than one day. °· You have leg pain. °This information is not intended to replace advice given to you by your health care provider. Make sure you discuss any questions you have   with your health care provider. Document Released: 10/03/2005 Document Revised: 09/15/2017 Document Reviewed: 03/21/2016 Elsevier Patient Education  2020 [place] After These instructions provide you with information about caring for yourself after your procedure. Your health care provider may also give you more specific instructions. Your treatment has been planned according to current medical practices, but problems sometimes occur. Call  your health care provider if you have any problems or questions after your procedure. What can I expect after the procedure? After your procedure, you may:  Feel sleepy for several hours.  Feel clumsy and have poor balance for several hours.  Feel forgetful about what happened after the procedure.  Have poor judgment for several hours.  Feel nauseous or vomit.  Have a sore throat if you had a breathing tube during the procedure. Follow these instructions at home: For at least 24 hours after the procedure:      Have a responsible adult stay with you. It is important to have someone help care for you until you are awake and alert.  Rest as needed.  Do not: ? Participate in activities in which you could fall or become injured. ? Drive. ? Use heavy machinery. ? Drink alcohol. ? Take sleeping pills or medicines that cause drowsiness. ? Make important decisions or sign legal documents. ? Take care of children on your own. Eating and drinking  Follow the diet that is recommended by your health care provider.  If you vomit, drink water, juice, or soup when you can drink without vomiting.  Make sure you have little or no nausea before eating solid foods. General instructions  Take over-the-counter and prescription medicines only as told by your health care provider.  If you have sleep apnea, surgery and certain medicines can increase your risk for breathing problems. Follow instructions from your health care provider about wearing your sleep device: ? Anytime you are sleeping, including during daytime naps. ? While taking prescription pain medicines, sleeping medicines, or medicines that make you drowsy.  If you smoke, do not smoke without supervision.  Keep all follow-up visits as told by your health care provider. This is important. Contact a health care provider if:  You keep feeling nauseous or you keep vomiting.  You feel light-headed.  You develop a rash.  You  have a fever. Get help right away if:  You have trouble breathing. Summary  For several hours after your procedure, you may feel sleepy and have poor judgment.  Have a responsible adult stay with you for at least 24 hours or until you are awake and alert. This information is not intended to replace advice given to you by your health care provider. Make sure you discuss any questions you have with your health care provider. Document Released: 01/24/2016 Document Revised: 01/01/2018 Document Reviewed: 01/24/2016 Elsevier Patient Education  2020 [employer].

## 2019-08-26 NOTE — Interval H&P Note (Signed)
History and Physical Interval Note:  08/26/2019 7:10 AM  Nathan Waters  has presented today for surgery, with the diagnosis of CHOLELITHIASIS, LYMPHADENOPOTHY.  The various methods of treatment have been discussed with the patient and family. After consideration of risks, benefits and other options for treatment, the patient has consented to  Procedure(s): LAPAROSCOPIC CHOLECYSTECTOMY (N/A) LYMPH NODE BIOPSY, INGUINAL (Right) as a surgical intervention.  The patient's history has been reviewed, patient examined, no change in status, stable for surgery.  I have reviewed the patient's chart and labs.  Questions were answered to the patient's satisfaction.     Aviva Signs

## 2019-08-26 NOTE — Anesthesia Preprocedure Evaluation (Signed)
Anesthesia Evaluation  Patient identified by MRN, date of birth, ID band Patient awake    Reviewed: Allergy & Precautions, NPO status , Patient's Chart, lab work & pertinent test results  Airway Mallampati: II  TM Distance: >3 FB Neck ROM: Full    Dental no notable dental hx. (+) Missing, Chipped, Poor Dentition   Pulmonary asthma , Current SmokerPatient did not abstain from smoking.,    Pulmonary exam normal breath sounds clear to auscultation       Cardiovascular Exercise Tolerance: Good hypertension, Pt. on medications + CAD, + Past MI and + Cardiac Stents  Normal cardiovascular examI Rhythm:Regular Rate:Normal  Reports great ET Denies limitations  H/o MI/Stent in 2013 Continues to smoke /smoked today  Denies NTG use I over 1 year    Neuro/Psych  Headaches, negative psych ROS   GI/Hepatic negative GI ROS, Neg liver ROS,   Endo/Other  negative endocrine ROS  Renal/GU negative Renal ROS  negative genitourinary   Musculoskeletal negative musculoskeletal ROS (+)   Abdominal   Peds negative pediatric ROS (+)  Hematology negative hematology ROS (+)   Anesthesia Other Findings   Reproductive/Obstetrics negative OB ROS                             Anesthesia Physical Anesthesia Plan  ASA: III  Anesthesia Plan: General   Post-op Pain Management:    Induction: Intravenous  PONV Risk Score and Plan: 1 and Ondansetron, Midazolam and Treatment may vary due to age or medical condition  Airway Management Planned: Oral ETT  Additional Equipment:   Intra-op Plan:   Post-operative Plan: Extubation in OR  Informed Consent: I have reviewed the patients History and Physical, chart, labs and discussed the procedure including the risks, benefits and alternatives for the proposed anesthesia with the patient or authorized representative who has indicated his/her understanding and acceptance.      Dental advisory given  Plan Discussed with: CRNA  Anesthesia Plan Comments: (Plan Full PPE use Plan GETA D/W PT -WTP with same after Q&A)        Anesthesia Quick Evaluation

## 2019-08-26 NOTE — Transfer of Care (Signed)
Immediate Anesthesia Transfer of Care Note  Patient: Nathan Waters  Procedure(s) Performed: LAPAROSCOPIC CHOLECYSTECTOMY (N/A Abdomen) LYMPH NODE BIOPSY, INGUINAL (Right Inguinal)  Patient Location: PACU  Anesthesia Type:General  Level of Consciousness: awake  Airway & Oxygen Therapy: Patient Spontanous Breathing  Post-op Assessment: Report given to RN  Post vital signs: Reviewed and stable  Last Vitals:  Vitals Value Taken Time  BP 130/89 08/26/19 0900  Temp    Pulse 100 08/26/19 0902  Resp 16 08/26/19 0902  SpO2 95 % 08/26/19 0902  Vitals shown include unvalidated device data.  Last Pain:  Vitals:   08/26/19 0718  TempSrc: Oral  PainSc:          Complications: No apparent anesthesia complications

## 2019-08-26 NOTE — Anesthesia Postprocedure Evaluation (Signed)
Anesthesia Post Note  Patient: Nathan Waters  Procedure(s) Performed: LAPAROSCOPIC CHOLECYSTECTOMY (N/A Abdomen) LYMPH NODE BIOPSY, INGUINAL (Right Inguinal)  Patient location during evaluation: PACU Anesthesia Type: General Level of consciousness: awake and alert and oriented Pain management: pain level controlled Vital Signs Assessment: post-procedure vital signs reviewed and stable Respiratory status: spontaneous breathing Cardiovascular status: blood pressure returned to baseline and stable Postop Assessment: no apparent nausea or vomiting Anesthetic complications: no Comments: Late entry     Last Vitals:  Vitals:   08/26/19 0930 08/26/19 0958  BP:  131/72  Pulse: (!) 110 87  Resp: 17 18  Temp:    SpO2: 94% 92%    Last Pain:  Vitals:   08/26/19 0915  TempSrc:   PainSc: 8                  Chi Woodham

## 2019-08-26 NOTE — Anesthesia Procedure Notes (Signed)
Procedure Name: Intubation Date/Time: 08/26/2019 7:45 AM Performed by: Ollen Bowl, CRNA Pre-anesthesia Checklist: Patient identified, Patient being monitored, Timeout performed, Emergency Drugs available and Suction available Patient Re-evaluated:Patient Re-evaluated prior to induction Oxygen Delivery Method: Circle system utilized Preoxygenation: Pre-oxygenation with 100% oxygen Induction Type: IV induction Ventilation: Mask ventilation without difficulty Laryngoscope Size: Glidescope (S3) Grade View: Grade I Tube type: Oral Tube size: 7.0 mm Number of attempts: 1 Airway Equipment and Method: Stylet Placement Confirmation: ETT inserted through vocal cords under direct vision,  positive ETCO2 and breath sounds checked- equal and bilateral Secured at: 23 cm Tube secured with: Tape Dental Injury: Teeth and Oropharynx as per pre-operative assessment

## 2019-08-26 NOTE — Op Note (Signed)
Patient:  Nathan Waters  DOB:  Mar 13, 1981  MRN:  [id number]   Preop Diagnosis: Biliary colic, cholelithiasis, generalized lymphadenopathy  Postop Diagnosis: Same  Procedure: Laparoscopic cholecystectomy, right inguinal lymph node biopsy  Surgeon: Aviva Signs, MD  Anes: General endotracheal  Indications: Patient is a 38 year old white male with a history of generalized lymphadenopathy who initially had a left axillary lymph node biopsy done which was benign.  He has persistent lymphadenopathy and now presents for a right inguinal lymph node biopsy.  In addition, he has had several episodes of biliary colic secondary to cholelithiasis, thus we will also proceed with laparoscopic cholecystectomy.  The risks and benefits of both procedures including bleeding, infection, hepatobiliary injury, and the possibility of an open procedure were fully explained to the patient, who gave informed consent.  Procedure note: The patient was placed in supine position.  After induction of general endotracheal anesthesia, the abdomen was prepped and draped using the usual sterile technique with ChloraPrep.  Surgical site confirmation was performed.  A supraumbilical incision was made down to the fascia.  A Veress needle was introduced into the abdominal cavity and confirmation of placement was done using the saline drop test.  The abdomen was then insufflated to 15 mmHg pressure.  An 11 mm trocar was introduced into the abdominal cavity under direct visualization without difficulty.  The patient was placed in reverse Trendelenburg position and an additional 11 mm trocar was placed in the epigastric region and 5 mm trochars were placed the right upper quadrant and right flank regions.  Liver was inspected noted to be within normal limits.  The gallbladder was noted to be full of gallstones and have a thickened gallbladder wall.  The gallbladder was retracted in a dynamic fashion in order to provide a critical view  of the triangle of Calot.  The cystic duct was first identified.  Its juncture to the infundibulum was fully identified.  Given the thick nature of the wall, a standard Endo GIA was placed across the cystic duct and fired.  The cystic artery was ligated and divided using endoclips.  The gallbladder was freed away from the gallbladder fossa using Bovie electrocautery.  The gallbladder was delivered through the epigastric trocar site using an Endo Catch bag.  The gallbladder fossa was inspected and any bleeding was controlled using Bovie electrocautery.  No bile leakage was noted.  Arista and Surgicel were placed in the gallbladder fossa.  All fluid and air were then evacuated from the abdominal cavity prior to removal of the trochars.  All wounds were irrigated with normal saline.  All wounds were injected with Exparel.  The epigastric fascia was reapproximated using 0 Vicryl interrupted sutures.  All skin incisions were closed using staples.  Betadine ointment and dry sterile dressings were applied.  Next, we proceeded with a right inguinal lymph node biopsy.  An oblique incision was made below the right groin crease down to the mass.  This mass was subcutaneous and layer and above the fascial line.  It measured approximately 6 cm in its greatest diameter.  It was fully excised without difficulty.  It was sent to pathology for flow cytometry.  A bleeding was controlled using Bovie electrocautery.  The subcutaneous layer was reapproximated using 3-0 Vicryl interrupted sutures.  The skin was closed using a 4-0 Monocryl subcuticular suture.  Exparel was instilled into the surrounding skin.  The wound was covered with Dermabond.  All tape and needle counts were correct at the end of  the procedures.  The patient was extubated in the operating room and transferred to PACU in stable condition.  Complications: None  EBL: Minimal  Specimen: Gallbladder, right inguinal lymph node

## 2019-08-27 ENCOUNTER — Encounter (HOSPITAL_COMMUNITY): Payer: Self-pay | Admitting: General Surgery

## 2019-08-27 ENCOUNTER — Other Ambulatory Visit (INDEPENDENT_AMBULATORY_CARE_PROVIDER_SITE_OTHER): Payer: Self-pay | Admitting: General Surgery

## 2019-08-27 DIAGNOSIS — Z09 Encounter for follow-up examination after completed treatment for conditions other than malignant neoplasm: Secondary | ICD-10-CM

## 2019-08-27 MED ORDER — OXYCODONE-ACETAMINOPHEN 7.5-325 MG PO TABS: 1.0000 | ORAL_TABLET | Freq: Four times a day (QID) | ORAL | 0 refills | Status: DC | PRN

## 2019-08-27 NOTE — Progress Notes (Signed)
Norco not working.  Will prescribe percocet and stop Norco.

## 2019-08-29 ENCOUNTER — Other Ambulatory Visit: Payer: Self-pay

## 2019-08-29 ENCOUNTER — Other Ambulatory Visit (HOSPITAL_COMMUNITY): Payer: Self-pay

## 2019-09-04 LAB — SURGICAL PATHOLOGY

## 2019-09-05 ENCOUNTER — Other Ambulatory Visit: Payer: Self-pay

## 2019-09-05 ENCOUNTER — Encounter: Payer: Self-pay | Admitting: General Surgery

## 2019-09-05 ENCOUNTER — Ambulatory Visit (INDEPENDENT_AMBULATORY_CARE_PROVIDER_SITE_OTHER): Payer: Self-pay | Admitting: General Surgery

## 2019-09-05 VITALS — BP 131/88 | HR 71 | Temp 98.2°F | Resp 16 | Ht 71.0 in | Wt 264.0 lb

## 2019-09-05 DIAGNOSIS — Z09 Encounter for follow-up examination after completed treatment for conditions other than malignant neoplasm: Secondary | ICD-10-CM

## 2019-09-05 NOTE — Progress Notes (Signed)
Subjective:     Nathan Waters  Here for postoperative visit.  Doing well.  His preoperative symptoms have resolved. Objective:    BP 131/88 (BP Location: Left Arm, Patient Position: Sitting, Cuff Size: Large)   Pulse 71   Temp 98.2 F (36.8 C) (Oral)   Resp 16   Ht 5\' 11"  (1.803 m)   Wt 264 lb (119.7 kg)   SpO2 94%   BMI 36.82 kg/m   General:  alert, cooperative and no distress  Abdomen soft, incisions healing well.  Staples removed, Steri-Strips applied. Right thigh wound healing well.  No seroma or hematoma present. Final pathology consistent with diagnosis.  Lymph node biopsy positive for lymphoma.  Patient made aware of the pathology report.     Assessment:    Doing well postoperatively.    Plan:   To see oncology in 2 weeks for follow-up of diagnosis of lymphoma.  Follow-up here as needed.

## 2019-09-05 NOTE — Patient Instructions (Signed)

## 2019-09-18 ENCOUNTER — Other Ambulatory Visit: Payer: Self-pay

## 2019-09-18 ENCOUNTER — Encounter (HOSPITAL_COMMUNITY): Payer: Self-pay | Admitting: Hematology

## 2019-09-18 ENCOUNTER — Other Ambulatory Visit (HOSPITAL_COMMUNITY): Payer: Self-pay | Admitting: *Deleted

## 2019-09-18 ENCOUNTER — Inpatient Hospital Stay (HOSPITAL_COMMUNITY): Payer: Medicaid Other

## 2019-09-18 ENCOUNTER — Inpatient Hospital Stay (HOSPITAL_COMMUNITY): Payer: Medicaid Other | Attending: Hematology | Admitting: Hematology

## 2019-09-18 VITALS — BP 142/91 | HR 94 | Temp 98.0°F | Resp 20 | Wt 266.0 lb

## 2019-09-18 DIAGNOSIS — C8190 Hodgkin lymphoma, unspecified, unspecified site: Secondary | ICD-10-CM

## 2019-09-18 DIAGNOSIS — F17210 Nicotine dependence, cigarettes, uncomplicated: Secondary | ICD-10-CM | POA: Insufficient documentation

## 2019-09-18 DIAGNOSIS — C8108 Nodular lymphocyte predominant Hodgkin lymphoma, lymph nodes of multiple sites: Secondary | ICD-10-CM | POA: Diagnosis not present

## 2019-09-18 LAB — CBC WITH DIFFERENTIAL/PLATELET
Abs Immature Granulocytes: 0.02 10*3/uL (ref 0.00–0.07)
Basophils Absolute: 0.1 10*3/uL (ref 0.0–0.1)
Basophils Relative: 1 %
Eosinophils Absolute: 0.4 10*3/uL (ref 0.0–0.5)
Eosinophils Relative: 5 %
HCT: 47.4 % (ref 39.0–52.0)
Hemoglobin: 15.6 g/dL (ref 13.0–17.0)
Immature Granulocytes: 0 %
Lymphocytes Relative: 41 %
Lymphs Abs: 3.8 10*3/uL (ref 0.7–4.0)
MCH: 32.1 pg (ref 26.0–34.0)
MCHC: 32.9 g/dL (ref 30.0–36.0)
MCV: 97.5 fL (ref 80.0–100.0)
Monocytes Absolute: 0.8 10*3/uL (ref 0.1–1.0)
Monocytes Relative: 9 %
Neutro Abs: 4.3 10*3/uL (ref 1.7–7.7)
Neutrophils Relative %: 44 %
Platelets: 327 10*3/uL (ref 150–400)
RBC: 4.86 MIL/uL (ref 4.22–5.81)
RDW: 14 % (ref 11.5–15.5)
WBC: 9.4 10*3/uL (ref 4.0–10.5)
nRBC: 0 % (ref 0.0–0.2)

## 2019-09-18 LAB — HEPATITIS B SURFACE ANTIGEN: Hepatitis B Surface Ag: NONREACTIVE

## 2019-09-18 LAB — HIV ANTIBODY (ROUTINE TESTING W REFLEX): HIV Screen 4th Generation wRfx: NONREACTIVE

## 2019-09-18 LAB — COMPREHENSIVE METABOLIC PANEL
ALT: 45 U/L — ABNORMAL HIGH (ref 0–44)
AST: 31 U/L (ref 15–41)
Albumin: 4.1 g/dL (ref 3.5–5.0)
Alkaline Phosphatase: 112 U/L (ref 38–126)
Anion gap: 7 (ref 5–15)
BUN: 7 mg/dL (ref 6–20)
CO2: 22 mmol/L (ref 22–32)
Calcium: 9 mg/dL (ref 8.9–10.3)
Chloride: 111 mmol/L (ref 98–111)
Creatinine, Ser: 0.79 mg/dL (ref 0.61–1.24)
GFR calc Af Amer: >60 mL/min (ref >60)
GFR calc non Af Amer: >60 mL/min (ref >60)
Glucose, Bld: 100 mg/dL — ABNORMAL HIGH (ref 70–99)
Potassium: 4 mmol/L (ref 3.5–5.1)
Sodium: 140 mmol/L (ref 135–145)
Total Bilirubin: 0.5 mg/dL (ref 0.3–1.2)
Total Protein: 7.1 g/dL (ref 6.5–8.1)

## 2019-09-18 LAB — HEPATITIS C ANTIBODY: HCV Ab: NONREACTIVE

## 2019-09-18 LAB — LACTATE DEHYDROGENASE: LDH: 151 U/L (ref 98–192)

## 2019-09-18 LAB — HEPATITIS B SURFACE ANTIBODY,QUALITATIVE: Hep B S Ab: NONREACTIVE

## 2019-09-18 LAB — HEPATITIS B CORE ANTIBODY, TOTAL: Hep B Core Total Ab: NONREACTIVE

## 2019-09-18 NOTE — Progress Notes (Signed)
[address] [place], [place] [address]   CLINIC:  Medical Oncology/Hematology  PCP:  Sofie Rower, PA-C [address] [place] [address] [phone]   REASON FOR VISIT:  Follow-up for lymph node biopsy results.  CURRENT THERAPY: R-CHOP based chemotherapy.   INTERVAL HISTORY:  Mr. Huisman 38 y.o. male seen for follow-up of lymph node biopsy results.  He had gallbladder removed as well as right inguinal lymph node biopsy done on 08/26/2019.  Denies any fevers, night sweats or weight loss.  Reports pain in the right thigh region rated as 5 out of 10.  Appetite and energy levels are 100%.  He also has some leg swellings bilaterally.  He had an MI in 2013 with stent placement.    REVIEW OF SYSTEMS:  Review of Systems  Cardiovascular: Positive for leg swelling.  Psychiatric/Behavioral: Positive for sleep disturbance.  All other systems reviewed and are negative.    PAST MEDICAL/SURGICAL HISTORY:  Past Medical History:  Diagnosis Date  . Asthma   . Coronary atherosclerosis of native coronary artery    BMS to mid circumflex 11/2011, LVEF 55-60%  . Essential hypertension, benign   . Migraine   . Mixed hyperlipidemia   . STEMI (ST elevation myocardial infarction) ([facility])    Complicated by VF arrest 11/2011   Past Surgical History:  Procedure Laterality Date  . AXILLARY LYMPH NODE BIOPSY Left 07/26/2019   Procedure: AXILLARY LYMPH NODE BIOPSY;  Surgeon: Aviva Signs, MD;  Location: [facility];  Service: General;  Laterality: Left;  . BACK SURGERY    . CARDIAC CATHETERIZATION    . CHOLECYSTECTOMY N/A 08/26/2019   Procedure: LAPAROSCOPIC CHOLECYSTECTOMY;  Surgeon: Aviva Signs, MD;  Location: [facility];  Service: General;  Laterality: N/A;  . LEFT HEART CATHETERIZATION WITH CORONARY ANGIOGRAM N/A 11/21/2011   Procedure: LEFT HEART CATHETERIZATION WITH CORONARY ANGIOGRAM;  Surgeon: Peter M Martinique, MD;  Location: [facility] CATH LAB;  Service: Cardiovascular;   Laterality: N/A;  . LYMPH NODE BIOPSY Right 08/26/2019   Procedure: LYMPH NODE BIOPSY, INGUINAL;  Surgeon: Aviva Signs, MD;  Location: [facility];  Service: General;  Laterality: Right;  . PERCUTANEOUS CORONARY STENT INTERVENTION (PCI-S) N/A 11/21/2011   Procedure: PERCUTANEOUS CORONARY STENT INTERVENTION (PCI-S);  Surgeon: Peter M Martinique, MD;  Location: [facility] CATH LAB;  Service: Cardiovascular;  Laterality: N/A;  . SPLENECTOMY       SOCIAL HISTORY:  Social History   Socioeconomic History  . Marital status: Divorced    Spouse name: Not on file  . Number of children: 2  . Years of education: Not on file  . Highest education level: Not on file  Occupational History  . 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 Financial resource strain: Not hard at all  . Food insecurity    Worry: Never true    Inability: Never true  . Transportation needs    Medical: No    Non-medical: No  Tobacco Use  . Smoking status: Current Every Day Smoker    Packs/day: 1.00    Types: Cigarettes  . Smokeless tobacco: Never Used  Substance and Sexual Activity  . Alcohol use: Yes    Comment: Occasionally  . Drug use: No  . Sexual activity: Yes    Birth control/protection: Rhythm  Lifestyle  . Physical activity    Days per week: 3 days    Minutes per session: 30 min  . Stress: Only a little  Relationships  . Social  connections    Talks on phone: More than three times a week    Gets together: Twice a week    Attends religious service: Never    Active member of club or organization: No    Attends meetings of clubs or organizations: Not on file    Relationship status: Divorced  . Intimate partner violence    Fear of current or ex partner: No    Emotionally abused: No    Physically abused: No    Forced sexual activity: No  Other Topics Concern  . Not on file  Social History Narrative  . Not on file    FAMILY HISTORY:  Family History  Problem Relation Age of Onset  . Malignant hyperthermia Mother    . Cancer Mother   . Malignant hyperthermia Brother   . Heart disease Brother   . Asthma Brother   . COPD Brother   . Heart disease Father   . Cancer Father   . ADD / ADHD Daughter   . ADD / ADHD Son     CURRENT MEDICATIONS:  Outpatient Encounter Medications as of 09/18/2019  Medication Sig  . cetirizine (ZYRTEC) 10 MG tablet Take 10 mg by mouth daily.  Marland Kitchen albuterol (PROVENTIL HFA;VENTOLIN HFA) 108 (90 Base) MCG/ACT inhaler Inhale 2 puffs into the lungs every 6 (six) hours as needed for wheezing or shortness of breath.   . nitroGLYCERIN (NITROSTAT) 0.4 MG SL tablet Place 1 tablet (0.4 mg total) under the tongue every 5 (five) minutes as needed for chest pain. (Patient not taking: Reported on 09/18/2019)  . [DISCONTINUED] HYDROcodone-acetaminophen (NORCO) 5-325 MG tablet Take 1 tablet by mouth every 4 (four) hours as needed for moderate pain.  . [DISCONTINUED] oxyCODONE-acetaminophen (PERCOCET) 7.5-325 MG tablet Take 1 tablet by mouth every 6 (six) hours as needed for severe pain.   No facility-administered encounter medications on file as of 09/18/2019.     ALLERGIES:  Allergies  Allergen Reactions  . Penicillins Anaphylaxis    Did it involve swelling of the face/tongue/throat, SOB, or low BP? Yes Did it involve sudden or severe rash/hives, skin peeling, or any reaction on the inside of your mouth or nose? No Did you need to seek medical attention at a hospital or doctor's office? Yes When did it last happen?Childhood allergy If all above answers are "NO", may proceed with cephalosporin use.      PHYSICAL EXAM:  ECOG Performance status: 1  Vitals:   09/18/19 1434  BP: (!) 142/91  Pulse: 94  Resp: 20  Temp: 98 F (36.7 C)  SpO2: 95%   Filed Weights   09/18/19 1434  Weight: 266 lb (120.7 kg)    Physical Exam Vitals signs reviewed.  Constitutional:      Appearance: Normal appearance.  Cardiovascular:     Rate and Rhythm: Normal rate and regular rhythm.      Heart sounds: Normal heart sounds.  Pulmonary:     Effort: Pulmonary effort is normal.     Breath sounds: Normal breath sounds.  Abdominal:     General: There is no distension.     Palpations: Abdomen is soft. There is no mass.  Musculoskeletal:        General: No swelling.  Lymphadenopathy:     Cervical: No cervical adenopathy.  Skin:    General: Skin is warm.  Neurological:     Mental Status: He is alert and oriented to person, place, and time.  Psychiatric:  Mood and Affect: Mood normal.        Behavior: Behavior normal.    Bilateral leg swellings present.  Right inguinal area is tender.  LABORATORY DATA:  I have reviewed the labs as listed.  CBC    Component Value Date/Time   WBC 9.4 09/18/2019 1540   RBC 4.86 09/18/2019 1540   HGB 15.6 09/18/2019 1540   HCT 47.4 09/18/2019 1540   PLT 327 09/18/2019 1540   MCV 97.5 09/18/2019 1540   MCH 32.1 09/18/2019 1540   MCHC 32.9 09/18/2019 1540   RDW 14.0 09/18/2019 1540   LYMPHSABS 3.8 09/18/2019 1540   MONOABS 0.8 09/18/2019 1540   EOSABS 0.4 09/18/2019 1540   BASOSABS 0.1 09/18/2019 1540   CMP Latest Ref Rng & Units 09/18/2019 08/15/2019 06/15/2019  Glucose 70 - 99 mg/dL 100(H) 133(H) 88  BUN 6 - 20 mg/dL 7 10 9   Creatinine 0.61 - 1.24 mg/dL 0.79 0.87 0.84  Sodium 135 - 145 mmol/L 140 137 140  Potassium 3.5 - 5.1 mmol/L 4.0 3.4(L) 3.7  Chloride 98 - 111 mmol/L 111 104 107  CO2 22 - 32 mmol/L 22 24 25   Calcium 8.9 - 10.3 mg/dL 9.0 9.0 9.5  Total Protein 6.5 - 8.1 g/dL 7.1 7.1 7.2  Total Bilirubin 0.3 - 1.2 mg/dL 0.5 0.3 0.7  Alkaline Phos 38 - 126 U/L 112 124 100  AST 15 - 41 U/L 31 29 26   ALT 0 - 44 U/L 45(H) 43 38       DIAGNOSTIC IMAGING:  I have independently reviewed the scans and discussed with the patient.   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  Nodular lymphocyte predominant Hodgkin's lymphoma: -CTAP on 06/15/2019 showed  incidental bilateral external iliac adenopathy measuring up to 3.4 cm on the right side.  There is a 5 x 4 cm soft tissue mass in the superficial soft tissues of the anteromedial right upper thigh.  Multiple mildly enlarged upper abdominal lymph nodes measuring up to 13 mm in short axis in the gastrohepatic space.  Several scattered small nodular density in the upper omentum measure up to 7 mm in short axis. 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 Dema Severin count was elevated since 2011, predominantly neutrophils and monocytes. -LDH and beta-2 microglobulin was normal. -CT of the chest showed mediastinal adenopathy and small left axillary adenopathy. -Biopsy of the left axillary lymph node on 07/26/2019 was benign tissue. -PET scan on 08/12/2019 showed lymphadenopathy in the chest, abdomen and pelvis.  Dominant nodes include a 4.2 cm short axis node in the right inferior inguinal region with the highest SUV and a 2.7 cm short axis node in the right paratracheal region. -Right inguinal lymph node biopsy on 08/26/2019 shows nodular lymphocyte predominant Hodgkin's lymphoma.  Popcorn cells seen.  IHC reveals CD20 positive LP cells and disrupted follicular dendritic networks. -He denies any fevers, night sweats or weight loss.  However he has pain in the right thigh. -We talked about options including chemotherapy with rituximab.  Observation is an option if he is not symptomatic.  However he has pain in the right thigh region. -I have recommended R CHOP based chemotherapy which is very active in this particular type of Hodgkin's lymphoma. -We talked about the schedule and side effects of chemotherapy in detail.  He will require port placement. -He will also require a 2D echocardiogram.  We will  also check hepatitis B and C serology.  We will check HIV serology. -He would like to have treatments after Christmas.  I will see him back in 4 to 5 weeks for follow-up  and initiation of therapy.  2.  Tobacco abuse: -Current active smoker, smokes 1 pack/day for 24 years.  3.  Family history: -Father was diagnosed with metastatic cancer right before his death.  Mother had throat cancer.  Paternal grandfather had stomach cancer.  4.  Leukocytosis: -He has predominantly neutrophilic leukocytosis since 2011. 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   Total time spent is 40 minutes with more than 50% of the time spent face-to-face discussing pathology report, new diagnosis, treatment plan, counseling and coordination of care.  Orders placed this encounter:  Orders Placed This Encounter  Procedures  . Lactate dehydrogenase  . Hepatitis B surface antibody  . Hepatitis B surface antigen  . Hepatitis B core antibody, total  . Hepatitis C Antibody  . HIV antibody  . ECHOCARDIOGRAM COMPLETE  . ECHOCARDIOGRAM COMPLETE      Derek Jack, MD [place] [phone]

## 2019-09-18 NOTE — Assessment & Plan Note (Signed)
1.  Nodular lymphocyte predominant Hodgkin's lymphoma: -CTAP on 06/15/2019 showed incidental bilateral external iliac adenopathy measuring up to 3.4 cm on the right side.  There is a 5 x 4 cm soft tissue mass in the superficial soft tissues of the anteromedial right upper thigh.  Multiple mildly enlarged upper abdominal lymph nodes measuring up to 13 mm in short axis in the gastrohepatic space.  Several scattered small nodular density in the upper omentum measure up to 7 mm in short axis. 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 Nathan Waters count was elevated since 2011, predominantly neutrophils and monocytes. -LDH and beta-2 microglobulin was normal. -CT of the chest showed mediastinal adenopathy and small left axillary adenopathy. -Biopsy of the left axillary lymph node on 07/26/2019 was benign tissue. -PET scan on 08/12/2019 showed lymphadenopathy in the chest, abdomen and pelvis.  Dominant nodes include a 4.2 cm short axis node in the right inferior inguinal region with the highest SUV and a 2.7 cm short axis node in the right paratracheal region. -Right inguinal lymph node biopsy on 08/26/2019 shows nodular lymphocyte predominant Hodgkin's lymphoma.  Popcorn cells seen.  IHC reveals CD20 positive LP cells and disrupted follicular dendritic networks. -He denies any fevers, night sweats or weight loss.  However he has pain in the right thigh. -We talked about options including chemotherapy with rituximab.  Observation is an option if he is not symptomatic.  However he has pain in the right thigh region. -I have recommended R CHOP based chemotherapy which is very active in this particular type of Hodgkin's lymphoma. -We talked about the schedule and side effects of chemotherapy in detail.  He will require port placement. -He will also require a 2D echocardiogram.  We will also check hepatitis B and C serology.  We will check HIV serology. -He would like to  have treatments after Christmas.  I will see him back in 4 to 5 weeks for follow-up and initiation of therapy.  2.  Tobacco abuse: -Current active smoker, smokes 1 pack/day for 24 years.  3.  Family history: -Father was diagnosed with metastatic cancer right before his death.  Mother had throat cancer.  Paternal grandfather had stomach cancer.  4.  Leukocytosis: -He has predominantly neutrophilic leukocytosis since 2011. 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

## 2019-09-18 NOTE — Progress Notes (Signed)
Orders for CBC diff and CMP per Dr. Arnoldo Morale for his pre-op labs.

## 2019-09-18 NOTE — Patient Instructions (Signed)
[place] at [facility] Discharge Instructions  You were seen today by Dr. Delton Coombes. He went over your recent biopsy results. He will start you on chemotherapy, RCHOP, you will have treatment every 3 weeks for 18 weeks. He will schedule you for port-a-cath placement. He will have blood work drawn today. He will also schedule you for an ECHO. He will see you back in 5 weeks for labs, treatment and follow up.   Thank you for choosing [place] at [facility] to provide your oncology and hematology care.  To afford each patient quality time with our provider, please arrive at least 15 minutes before your scheduled appointment time.   If you have a lab appointment with the [facility] please come in thru the  Main Entrance and check in at the main information desk  You need to re-schedule your appointment should you arrive 10 or more minutes late.  We strive to give you quality time with our providers, and arriving late affects you and other patients whose appointments are after yours.  Also, if you no show three or more times for appointments you may be dismissed from the clinic at the providers discretion.     Again, thank you for choosing [facility].  Our hope is that these requests will decrease the amount of time that you wait before being seen by our physicians.       _____________________________________________________________  Should you have questions after your visit to [facility], please contact our office at [phone] between the hours of 8:00 a.m. and 4:30 p.m.  Voicemails left after 4:00 p.m. will not be returned until the following business day.  For prescription refill requests, have your pharmacy contact our office and allow 72 hours.    Cancer Center Support Programs:   > Cancer Support Group  2nd Tuesday of the month 1pm-2pm, Journey Room

## 2019-09-19 ENCOUNTER — Ambulatory Visit (HOSPITAL_COMMUNITY)
Admission: RE | Admit: 2019-09-19 | Discharge: 2019-09-19 | Disposition: A | Discharge status: Home / Self Care | Payer: Medicaid Other | Source: Ambulatory Visit | Attending: Hematology | Admitting: Hematology

## 2019-09-19 DIAGNOSIS — C8190 Hodgkin lymphoma, unspecified, unspecified site: Secondary | ICD-10-CM | POA: Diagnosis present

## 2019-09-19 NOTE — Progress Notes (Signed)
*  PRELIMINARY RESULTS* Echocardiogram 2D Echocardiogram has been performed.  N[place] [facility] 09/19/2019, 9:10 AM

## 2019-09-20 NOTE — H&P (Signed)
Nathan Waters is an 38 y.o. male.   Chief Complaint: Hodgkin's lymphoma HPI: Patient is a 38 year old white male who was recently diagnosed with Hodgkin's lymphoma and is in need of a Port-A-Cath placement for chemotherapy.  Past Medical History:  Diagnosis Date  . Asthma   . Coronary atherosclerosis of native coronary artery    BMS to mid circumflex 11/2011, LVEF 55-60%  . Essential hypertension, benign   . Migraine   . Mixed hyperlipidemia   . STEMI (ST elevation myocardial infarction) ([place])    Complicated by VF arrest 11/2011    Past Surgical History:  Procedure Laterality Date  . AXILLARY LYMPH NODE BIOPSY Left 07/26/2019   Procedure: AXILLARY LYMPH NODE BIOPSY;  Surgeon: Aviva Signs, MD;  Location: [facility];  Service: General;  Laterality: Left;  . BACK SURGERY    . CARDIAC CATHETERIZATION    . CHOLECYSTECTOMY N/A 08/26/2019   Procedure: LAPAROSCOPIC CHOLECYSTECTOMY;  Surgeon: Aviva Signs, MD;  Location: [facility];  Service: General;  Laterality: N/A;  . LEFT HEART CATHETERIZATION WITH CORONARY ANGIOGRAM N/A 11/21/2011   Procedure: LEFT HEART CATHETERIZATION WITH CORONARY ANGIOGRAM;  Surgeon: Peter M Martinique, MD;  [facility];  Service: Cardiovascular;  Laterality: N/A;  . LYMPH NODE BIOPSY Right 08/26/2019   Procedure: LYMPH NODE BIOPSY, INGUINAL;  Surgeon: Aviva Signs, MD;  Location: [facility];  Service: General;  Laterality: Right;  . PERCUTANEOUS CORONARY STENT INTERVENTION (PCI-S) N/A 11/21/2011   Procedure: PERCUTANEOUS CORONARY STENT INTERVENTION (PCI-S);  Surgeon: Peter M Martinique, MD;  [facility];  Service: Cardiovascular;  Laterality: N/A;  . SPLENECTOMY      Family History  Problem Relation Age of Onset  . Malignant hyperthermia Mother   . Cancer Mother   . Malignant hyperthermia Brother   . Heart disease Brother   . Asthma Brother   . COPD Brother   . Heart disease Father   . Cancer Father   . ADD / ADHD Daughter   . ADD / ADHD Son    Social  History:  reports that he has been smoking cigarettes. He has been smoking about 1.00 pack per day. He has never used smokeless tobacco. He reports current alcohol use. He reports that he does not use drugs.  Allergies:  Allergies  Allergen Reactions  . Penicillins Anaphylaxis    Did it involve swelling of the face/tongue/throat, SOB, or low BP? Yes Did it involve sudden or severe rash/hives, skin peeling, or any reaction on the inside of your mouth or nose? No Did you need to seek medical attention at a [facility] or doctor's office? Yes When did it last happen?Childhood allergy If all above answers are "NO", may proceed with cephalosporin use.     No medications prior to admission.    Results for orders placed or performed in visit on 09/18/19 (from the past 48 hour(s))  Comprehensive metabolic panel     Status: Abnormal   Collection Time: 09/18/19  3:40 PM  Result Value Ref Range   Sodium 140 135 - 145 mmol/L   Potassium 4.0 3.5 - 5.1 mmol/L   Chloride 111 98 - 111 mmol/L   CO2 22 22 - 32 mmol/L   Glucose, Bld 100 (H) 70 - 99 mg/dL   BUN 7 6 - 20 mg/dL   Creatinine, Ser 0.79 0.61 - 1.24 mg/dL   Calcium 9.0 8.9 - 10.3 mg/dL   Total Protein 7.1 6.5 - 8.1 g/dL   Albumin 4.1 3.5 - 5.0  g/dL   AST 31 15 - 41 U/L   ALT 45 (H) 0 - 44 U/L   Alkaline Phosphatase 112 38 - 126 U/L   Total Bilirubin 0.5 0.3 - 1.2 mg/dL   GFR calc non Af Amer >60 >60 mL/min   GFR calc Af Amer >60 >60 mL/min   Anion gap 7 5 - 15    Comment: Performed at [facility], [address], [place], [place] [address]  CBC with Differential     Status: None   Collection Time: 09/18/19  3:40 PM  Result Value Ref Range   WBC 9.4 4.0 - 10.5 K/uL   RBC 4.86 4.22 - 5.81 MIL/uL   Hemoglobin 15.6 13.0 - 17.0 g/dL   HCT 47.4 39.0 - 52.0 %   MCV 97.5 80.0 - 100.0 fL   MCH 32.1 26.0 - 34.0 pg   MCHC 32.9 30.0 - 36.0 g/dL   RDW 14.0 11.5 - 15.5 %   Platelets 327 150 - 400 K/uL   nRBC 0.0 0.0 - 0.2 %    Neutrophils Relative % 44 %   Neutro Abs 4.3 1.7 - 7.7 K/uL   Lymphocytes Relative 41 %   Lymphs Abs 3.8 0.7 - 4.0 K/uL   Monocytes Relative 9 %   Monocytes Absolute 0.8 0.1 - 1.0 K/uL   Eosinophils Relative 5 %   Eosinophils Absolute 0.4 0.0 - 0.5 K/uL   Basophils Relative 1 %   Basophils Absolute 0.1 0.0 - 0.1 K/uL   Immature Granulocytes 0 %   Abs Immature Granulocytes 0.02 0.00 - 0.07 K/uL    Comment: Performed at [facility], [address], [place], [place] [address]  HIV antibody     Status: None   Collection Time: 09/18/19  3:40 PM  Result Value Ref Range   HIV Screen 4th Generation wRfx NON REACTIVE NON REACTIVE    Comment: Performed at [facility], [place] [address], [place], [place] [address]  Hepatitis C Antibody     Status: None   Collection Time: 09/18/19  3:40 PM  Result Value Ref Range   HCV Ab NON REACTIVE NON REACTIVE    Comment: (NOTE) Nonreactive HCV antibody screen is consistent with no HCV infections,  unless recent infection is suspected or other evidence exists to indicate HCV infection. Performed at [facility], [place] [address], [place], [place] [address]   Hepatitis B core antibody, total     Status: None   Collection Time: 09/18/19  3:40 PM  Result Value Ref Range   Hep B Core Total Ab NON REACTIVE NON REACTIVE    Comment: Performed at [facility] [address] [place], [place] [address]  Hepatitis B surface antigen     Status: None   Collection Time: 09/18/19  3:40 PM  Result Value Ref Range   Hepatitis B Surface Ag NON REACTIVE NON REACTIVE    Comment: Performed at [facility] [address] [place], [place] [address]  Hepatitis B surface antibody     Status: None   Collection Time: 09/18/19  3:40 PM  Result Value Ref Range   Hep B S Ab NON REACTIVE NON REACTIVE    Comment: (NOTE) Inconsistent with immunity, less than 10 mIU/mL. Performed at [place] [facility], [place] [address], [place], [place] [address]    Lactate dehydrogenase     Status: None   Collection Time: 09/18/19  3:40 PM  Result Value Ref Range  LDH 151 98 - 192 U/L    Comment: Performed at [facility], [address], [place], [place] [address]   No results found.  Review of Systems  Constitutional: Negative.   HENT: Negative.   Eyes: Negative.   Respiratory: Negative.   Cardiovascular: Negative.   Gastrointestinal: Negative.   Genitourinary: Negative.   Musculoskeletal: Negative.   Skin: Negative.   All other systems reviewed and are negative.   There were no vitals taken for this visit. Physical Exam  Vitals reviewed. Constitutional: He is oriented to person, place, and time. He appears well-developed and well-nourished. No distress.  HENT:  Head: Normocephalic and atraumatic.  Cardiovascular: Normal rate and regular rhythm. Exam reveals no gallop and no friction rub.  No murmur heard. Respiratory: Effort normal and breath sounds normal. No respiratory distress. He has no wheezes. He has no rales.  Neurological: He is alert and oriented to person, place, and time.  Skin: Skin is warm and dry.     Assessment/Plan Hodgkin's lymphoma, need for central venous access Plan: Patient is scheduled for Port-A-Cath insertion on 10/01/2019.  The risks and benefits of the procedure including bleeding, infection, and pneumothorax were fully explained to the patient, who gave informed consent.  Aviva Signs, MD 09/20/2019, 8:34 AM

## 2019-09-26 ENCOUNTER — Other Ambulatory Visit: Payer: Self-pay

## 2019-09-26 ENCOUNTER — Encounter (HOSPITAL_COMMUNITY)
Admission: RE | Admit: 2019-09-26 | Discharge: 2019-09-26 | Disposition: A | Discharge status: Home / Self Care | Payer: Medicaid Other | Source: Ambulatory Visit | Attending: General Surgery | Admitting: General Surgery

## 2019-09-30 ENCOUNTER — Other Ambulatory Visit (HOSPITAL_COMMUNITY)
Admission: RE | Admit: 2019-09-30 | Discharge: 2019-09-30 | Disposition: A | Discharge status: Home / Self Care | Payer: Medicaid Other | Source: Ambulatory Visit | Attending: General Surgery | Admitting: General Surgery

## 2019-09-30 DIAGNOSIS — Z20828 Contact with and (suspected) exposure to other viral communicable diseases: Secondary | ICD-10-CM | POA: Insufficient documentation

## 2019-09-30 DIAGNOSIS — Z01812 Encounter for preprocedural laboratory examination: Secondary | ICD-10-CM | POA: Insufficient documentation

## 2019-09-30 LAB — SARS CORONAVIRUS 2 (TAT 6-24 HRS): SARS Coronavirus 2: NEGATIVE

## 2019-10-01 ENCOUNTER — Ambulatory Visit (HOSPITAL_COMMUNITY): Payer: Medicaid Other | Admitting: Anesthesiology

## 2019-10-01 ENCOUNTER — Ambulatory Visit (HOSPITAL_COMMUNITY): Payer: Medicaid Other

## 2019-10-01 ENCOUNTER — Ambulatory Visit (HOSPITAL_COMMUNITY)
Admission: RE | Admit: 2019-10-01 | Discharge: 2019-10-01 | Disposition: A | Discharge status: Home / Self Care | Payer: Medicaid Other | Attending: General Surgery | Admitting: General Surgery

## 2019-10-01 ENCOUNTER — Other Ambulatory Visit: Payer: Self-pay

## 2019-10-01 ENCOUNTER — Encounter (HOSPITAL_COMMUNITY)
Admission: RE | Disposition: A | Discharge status: Home / Self Care | Payer: Self-pay | Source: Home / Self Care | Attending: General Surgery

## 2019-10-01 ENCOUNTER — Encounter (HOSPITAL_COMMUNITY): Payer: Self-pay | Admitting: General Surgery

## 2019-10-01 DIAGNOSIS — I10 Essential (primary) hypertension: Secondary | ICD-10-CM | POA: Diagnosis not present

## 2019-10-01 DIAGNOSIS — Z955 Presence of coronary angioplasty implant and graft: Secondary | ICD-10-CM | POA: Insufficient documentation

## 2019-10-01 DIAGNOSIS — I252 Old myocardial infarction: Secondary | ICD-10-CM | POA: Diagnosis not present

## 2019-10-01 DIAGNOSIS — C8190 Hodgkin lymphoma, unspecified, unspecified site: Secondary | ICD-10-CM | POA: Diagnosis present

## 2019-10-01 DIAGNOSIS — F17210 Nicotine dependence, cigarettes, uncomplicated: Secondary | ICD-10-CM | POA: Diagnosis not present

## 2019-10-01 DIAGNOSIS — I2510 Atherosclerotic heart disease of native coronary artery without angina pectoris: Secondary | ICD-10-CM | POA: Diagnosis not present

## 2019-10-01 DIAGNOSIS — Z95828 Presence of other vascular implants and grafts: Secondary | ICD-10-CM

## 2019-10-01 HISTORY — PX: PORTACATH PLACEMENT: SHX2246

## 2019-10-01 SURGERY — INSERTION, TUNNELED CENTRAL VENOUS DEVICE, WITH PORT
Anesthesia: Monitor Anesthesia Care | Site: Chest | Laterality: Left

## 2019-10-01 MED ORDER — PROPOFOL 10 MG/ML IV BOLUS
INTRAVENOUS | Status: AC
  Filled 2019-10-01: qty 20

## 2019-10-01 MED ORDER — KETOROLAC TROMETHAMINE 30 MG/ML IJ SOLN
30.0000 mg | Freq: Once | INTRAMUSCULAR | Status: AC
  Administered 2019-10-01: 30 mg via INTRAVENOUS
  Filled 2019-10-01: qty 1

## 2019-10-01 MED ORDER — HYDROMORPHONE HCL 1 MG/ML IJ SOLN: 0.2500 mg | INTRAMUSCULAR | Status: DC | PRN

## 2019-10-01 MED ORDER — MIDAZOLAM HCL 5 MG/5ML IJ SOLN
INTRAMUSCULAR | Status: DC | PRN
  Administered 2019-10-01: 2 mg via INTRAVENOUS

## 2019-10-01 MED ORDER — OXYCODONE-ACETAMINOPHEN 7.5-325 MG PO TABS: 1.0000 | ORAL_TABLET | Freq: Four times a day (QID) | ORAL | 0 refills | Status: DC | PRN

## 2019-10-01 MED ORDER — PROMETHAZINE HCL 25 MG/ML IJ SOLN: 6.2500 mg | INTRAMUSCULAR | Status: DC | PRN

## 2019-10-01 MED ORDER — CHLORHEXIDINE GLUCONATE CLOTH 2 % EX PADS: 6.0000 | MEDICATED_PAD | Freq: Once | CUTANEOUS | Status: DC

## 2019-10-01 MED ORDER — LACTATED RINGERS IV SOLN: INTRAVENOUS | Status: DC

## 2019-10-01 MED ORDER — LIDOCAINE HCL (PF) 1 % IJ SOLN
INTRAMUSCULAR | Status: DC | PRN
  Administered 2019-10-01: 14 mL

## 2019-10-01 MED ORDER — LIDOCAINE HCL (PF) 1 % IJ SOLN
INTRAMUSCULAR | Status: AC
  Filled 2019-10-01: qty 30

## 2019-10-01 MED ORDER — FENTANYL CITRATE (PF) 100 MCG/2ML IJ SOLN
INTRAMUSCULAR | Status: DC | PRN
  Administered 2019-10-01 (×2): 50 ug via INTRAVENOUS

## 2019-10-01 MED ORDER — MIDAZOLAM HCL 2 MG/2ML IJ SOLN: 0.5000 mg | Freq: Once | INTRAMUSCULAR | Status: DC | PRN

## 2019-10-01 MED ORDER — SODIUM CHLORIDE (PF) 0.9 % IJ SOLN
INTRAMUSCULAR | Status: DC | PRN
  Administered 2019-10-01: 500 mL via INTRAVENOUS

## 2019-10-01 MED ORDER — LACTATED RINGERS IV SOLN: INTRAVENOUS | Status: DC | PRN

## 2019-10-01 MED ORDER — FENTANYL CITRATE (PF) 100 MCG/2ML IJ SOLN
INTRAMUSCULAR | Status: AC
  Filled 2019-10-01: qty 2

## 2019-10-01 MED ORDER — HEPARIN SOD (PORK) LOCK FLUSH 100 UNIT/ML IV SOLN
INTRAVENOUS | Status: DC | PRN
  Administered 2019-10-01: 500 [IU] via INTRAVENOUS

## 2019-10-01 MED ORDER — HEPARIN SOD (PORK) LOCK FLUSH 100 UNIT/ML IV SOLN
INTRAVENOUS | Status: AC
  Filled 2019-10-01: qty 5

## 2019-10-01 MED ORDER — PROPOFOL 500 MG/50ML IV EMUL
INTRAVENOUS | Status: DC | PRN
  Administered 2019-10-01: 75 ug/kg/min via INTRAVENOUS

## 2019-10-01 MED ORDER — VANCOMYCIN HCL 10 G IV SOLR
1500.0000 mg | INTRAVENOUS | Status: AC
  Administered 2019-10-01: 1500 mg via INTRAVENOUS
  Filled 2019-10-01: qty 1500

## 2019-10-01 MED ORDER — PROPOFOL 10 MG/ML IV BOLUS
INTRAVENOUS | Status: DC | PRN
  Administered 2019-10-01 (×3): 20 mg via INTRAVENOUS

## 2019-10-01 MED ORDER — HYDROCODONE-ACETAMINOPHEN 7.5-325 MG PO TABS
1.0000 | ORAL_TABLET | Freq: Once | ORAL | Status: AC | PRN
  Administered 2019-10-01: 1 via ORAL
  Filled 2019-10-01: qty 1

## 2019-10-01 MED ORDER — MIDAZOLAM HCL 2 MG/2ML IJ SOLN
INTRAMUSCULAR | Status: AC
  Filled 2019-10-01: qty 2

## 2019-10-01 SURGICAL SUPPLY — 29 items
APPLICATOR CHLORAPREP 10.5 ORG (MISCELLANEOUS) ×2 IMPLANT
BAG DECANTER FOR FLEXI CONT (MISCELLANEOUS) ×2 IMPLANT
CLOTH BEACON ORANGE TIMEOUT ST (SAFETY) ×2 IMPLANT
COVER LIGHT HANDLE STERIS (MISCELLANEOUS) ×4 IMPLANT
COVER WAND RF STERILE (DRAPES) ×2 IMPLANT
DECANTER SPIKE VIAL GLASS SM (MISCELLANEOUS) ×2 IMPLANT
DERMABOND ADVANCED (GAUZE/BANDAGES/DRESSINGS) ×1
DERMABOND ADVANCED .7 DNX12 (GAUZE/BANDAGES/DRESSINGS) ×1 IMPLANT
DRAPE C-ARM FOLDED MOBILE STRL (DRAPES) ×2 IMPLANT
ELECT REM PT RETURN 9FT ADLT (ELECTROSURGICAL) ×2
ELECTRODE REM PT RTRN 9FT ADLT (ELECTROSURGICAL) ×1 IMPLANT
GLOVE BIOGEL PI IND STRL 7.0 (GLOVE) ×2 IMPLANT
GLOVE BIOGEL PI INDICATOR 7.0 (GLOVE) ×2
GLOVE ECLIPSE 6.5 STRL STRAW (GLOVE) ×2 IMPLANT
GLOVE SURG SS PI 7.5 STRL IVOR (GLOVE) ×2 IMPLANT
GOWN STRL REUS W/TWL LRG LVL3 (GOWN DISPOSABLE) ×4 IMPLANT
IV NS 500ML (IV SOLUTION) ×1
IV NS 500ML BAXH (IV SOLUTION) ×1 IMPLANT
KIT PORT POWER 8FR ISP MRI (Port) ×2 IMPLANT
KIT TURNOVER KIT A (KITS) ×2 IMPLANT
NEEDLE HYPO 25X1 1.5 SAFETY (NEEDLE) ×2 IMPLANT
PACK MINOR (CUSTOM PROCEDURE TRAY) ×2 IMPLANT
PAD ARMBOARD 7.5X6 YLW CONV (MISCELLANEOUS) ×2 IMPLANT
SET BASIN LINEN APH (SET/KITS/TRAYS/PACK) ×2 IMPLANT
SUT MNCRL AB 4-0 PS2 18 (SUTURE) ×2 IMPLANT
SUT VIC AB 3-0 SH 27 (SUTURE) ×1
SUT VIC AB 3-0 SH 27X BRD (SUTURE) ×1 IMPLANT
SYR 5ML LL (SYRINGE) ×2 IMPLANT
SYR CONTROL 10ML LL (SYRINGE) ×2 IMPLANT

## 2019-10-01 NOTE — Interval H&P Note (Signed)
History and Physical Interval Note:  10/01/2019 7:30 AM  N[place] Waters  has presented today for surgery, with the diagnosis of Lymphoma.  The various methods of treatment have been discussed with the patient and family. After consideration of risks, benefits and other options for treatment, the patient has consented to  Procedure(s): INSERTION PORT-A-CATH (N/A) as a surgical intervention.  The patient's history has been reviewed, patient examined, no change in status, stable for surgery.  I have reviewed the patient's chart and labs.  Questions were answered to the patient's satisfaction.     Aviva Signs

## 2019-10-01 NOTE — Anesthesia Postprocedure Evaluation (Signed)
Anesthesia Post Note  Patient: Nathan Waters  Procedure(s) Performed: INSERTION PORT-A-CATH (catheter attached left subclavian) (Left Chest)  Patient location during evaluation: PACU Anesthesia Type: MAC Level of consciousness: awake and alert and oriented Pain management: pain level controlled Vital Signs Assessment: post-procedure vital signs reviewed and stable Respiratory status: spontaneous breathing Cardiovascular status: blood pressure returned to baseline and stable Postop Assessment: no apparent nausea or vomiting Anesthetic complications: no Comments: Late entry     Last Vitals:  Vitals:   10/01/19 0830 10/01/19 0846  BP: 127/83 (!) 142/81  Pulse: 72 68  Resp: 17 19  Temp:  36.5 C  SpO2: 96% 95%    Last Pain:  Vitals:   10/01/19 0846  TempSrc: Oral  PainSc: 0-No pain                 Tayler Heiden

## 2019-10-01 NOTE — Discharge Instructions (Signed)

## 2019-10-01 NOTE — Op Note (Signed)
Patient:  Nathan Waters  DOB:  [birth year]  MRN:  [id number]   Preop Diagnosis: Hodgkin's lymphoma, need for central venous access  Postop Diagnosis: Same  Procedure: Port-A-Cath insertion  Surgeon: Aviva Signs, MD  Anes: MAC  Indications: Patient is a 38 year old white male who is about to undergo chemotherapy for Hodgkin's lymphoma and needs central venous access.  The risks and benefits of the procedure including bleeding, infection, and the possibility of a pneumothorax were fully explained to the patient, who gave informed consent.  Procedure note: The patient was placed in the Trendelenburg position after the left upper chest was prepped and draped using the usual sterile technique with ChloraPrep.  Surgical site confirmation was performed.  1% Xylocaine was used for local anesthesia.  An incision was made below the left clavicle.  A subcutaneous pocket was formed.  A needle was advanced into the left subclavian vein using the Seldinger technique without difficulty.  A guidewire was then advanced into the right atrium under fluoroscopic guidance.  An introducer and peel-away sheath were placed over the guidewire.  The catheter was inserted through the peel-away sheath and the peel-away sheath was removed.  The catheter was then attached to the port and the port placed in subcutaneous pocket.  Adequate positioning was confirmed by fluoroscopy.  Good backflow of venous blood was noted on aspiration of the port.  The port was flushed with saline.  The subcutaneous layer was reapproximated using a 3-0 Vicryl interrupted suture.  The skin was closed using a 4-0 Monocryl subcuticular suture.  Dermabond was applied.  All tape and needle counts were correct at the end of the procedure.  The patient was transferred to the PACU in stable condition.  A chest x-ray will be performed at that time.  Complications: None  EBL: Minimal  Specimen: None

## 2019-10-01 NOTE — Anesthesia Preprocedure Evaluation (Signed)
Anesthesia Evaluation  Patient identified by MRN, date of birth, ID band Patient awake    Reviewed: Allergy & Precautions, NPO status , Patient's Chart, lab work & pertinent test results  Airway Mallampati: II  TM Distance: >3 FB Neck ROM: Full    Dental no notable dental hx. (+) Poor Dentition, Missing   Pulmonary asthma , Current SmokerPatient did not abstain from smoking.,    Pulmonary exam normal breath sounds clear to auscultation       Cardiovascular Exercise Tolerance: Good (-) hypertension+ CAD and + Past MI  Normal cardiovascular examI Rhythm:Regular Rate:Normal  Reports MI 2013 Denies NTG use in over a year  Reports can walk a mile without issues    Neuro/Psych  Headaches, negative psych ROS   GI/Hepatic negative GI ROS, Neg liver ROS,   Endo/Other  negative endocrine ROS  Renal/GU negative Renal ROS  negative genitourinary   Musculoskeletal negative musculoskeletal ROS (+)   Abdominal   Peds negative pediatric ROS (+)  Hematology negative hematology ROS (+)   Anesthesia Other Findings   Reproductive/Obstetrics negative OB ROS                             Anesthesia Physical Anesthesia Plan  ASA: III  Anesthesia Plan: MAC   Post-op Pain Management:    Induction: Intravenous  PONV Risk Score and Plan:   Airway Management Planned: Nasal Cannula and Simple Face Mask  Additional Equipment:   Intra-op Plan:   Post-operative Plan: Extubation in OR  Informed Consent: I have reviewed the patients History and Physical, chart, labs and discussed the procedure including the risks, benefits and alternatives for the proposed anesthesia with the patient or authorized representative who has indicated his/her understanding and acceptance.     Dental advisory given  Plan Discussed with: CRNA  Anesthesia Plan Comments: (Plan Full PPE use  Plan MAC as tolerated ,  with  GA/GETA as needed d/w pt -WTP with same after Q&A)        Anesthesia Quick Evaluation

## 2019-10-01 NOTE — Transfer of Care (Signed)
Immediate Anesthesia Transfer of Care Note  Patient: [place] Waters  Procedure(s) Performed: INSERTION PORT-A-CATH (catheter attached left subclavian) (Left Chest)  Patient Location: PACU  Anesthesia Type:MAC  Level of Consciousness: awake  Airway & Oxygen Therapy: Patient Spontanous Breathing  Post-op Assessment: Report given to RN  Post vital signs: Reviewed  Last Vitals:  Vitals Value Taken Time  BP 126/71 10/01/19 0815  Temp    Pulse 73 10/01/19 0815  Resp 15 10/01/19 0815  SpO2 97 % 10/01/19 0815  Vitals shown include unvalidated device data.  Last Pain:  Vitals:   10/01/19 0731  TempSrc: Oral  PainSc: 0-No pain         Complications: No apparent anesthesia complications

## 2019-10-14 MED ORDER — PROCHLORPERAZINE MALEATE 10 MG PO TABS: 10.0000 mg | ORAL_TABLET | Freq: Four times a day (QID) | ORAL | 0 refills | Status: DC | PRN

## 2019-10-14 MED ORDER — PREDNISONE 20 MG PO TABS: ORAL_TABLET | ORAL | 5 refills | Status: DC

## 2019-10-14 MED ORDER — LIDOCAINE-PRILOCAINE 2.5-2.5 % EX CREA: TOPICAL_CREAM | CUTANEOUS | 0 refills | Status: DC

## 2019-10-14 NOTE — Patient Instructions (Signed)
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  You will be treated with a combination of chemotherapy and immunotherapy drugs (see below) every 3 weeks for 6 cycles.  The intent of this treatment is cure.  You will see the doctor regularly throughout treatment.  We will obtain blood work from you prior to every treatment and monitor your results to make sure it is safe to give your treatment. The doctor monitors your response to treatment by the way you are feeling, your blood work, and by obtaining scans periodically. There will be wait times while you are here for treatment.  It will take about 30 minutes to 1 hour for your lab work to result.  Then there will be wait times while pharmacy mixes your medications.     R-CHOP is the acronym for the combination of chemotherapy drugs and other drugs you will receive for your treatment.  The drugs this regimen includes are:  Rituxan (rituximab); Cytoxan (cyclophosphamide); Oncovin (vincristine); Adriamycin (doxorubicin HCl); and prednisone (not a chemo drug).  Medications you will receive in the clinic prior to your chemotherapy medications:  Aloxi:  ALOXI is used in adults to help prevent nausea and vomiting that happens with certain chemotherapy drugs.  Aloxi is a long acting medication, and will remain in your system for about two days.   Emend:  This is an anti-nausea medication that is used with Aloxi to help prevent nausea and vomiting caused by chemotherapy.  Dexamethasone:  This is a steroid given prior to chemotherapy to help prevent allergic reactions; it may also help prevent and control nausea and diarrhea.   Tylenol:  Given to prevent infusion reactions to rituximab.  Benadryl:  Antihistamine given to prevent allergic/infusion reactions to rituximab.    Doxorubicin (Generic Name) Other Names: Adriamycin, hydroxyl daunorubicin  About This  Drug  Doxorubicin is a drug used to treat cancer. This drug is given in the vein (IV).  This is an IV push that will take about 5-10 minutes.  Possible Side Effects (More Common)  . Bone marrow depression. This is a decrease in the number of white blood cells, red blood cells, and platelets. This may raise your risk of infection, make you tired and weak (fatigue), and raise your risk of bleeding.  . Hair loss: Hair loss is often complete scalp hair loss and can involve loss of eyebrows, eyelashes, and pubic hair. You may notice this a few days or weeks after treatment has started. Most often hair loss is temporary; your hair should grow back when treatment is done.  . Nausea and throwing up (vomiting). These symptoms may happen within a few hours after your treatment and may last up to 24 hours. Medicines are available to stop or lessen these side effects.  . Soreness of the mouth and throat. You may have red areas, white patches, or sores that hurt.  . Change in the color of your urine to pink or red. This color change will go away in one to two days.  . Effects on the heart: This drug can weaken the heart and lower heart function. Your heart function will be checked as needed. You may have trouble catching your breath, mainly during activities.  You may also have trouble breathing while lying down, and have swelling in your ankles.  . Sensitivity to light (photosensitivity). Photosensitivity means that you may become more sensitive to the effects of the sun, sun lamps, and  tanning beds. Your eyes may water more, mostly in bright light.  . Metallic taste in the mouth: This may change the taste of food and drinks  . Decreased appetite (decreased hunger)  . Darkening of the skin or nails  . Weakness that interferes with your daily activities   Possible Side Effects (Less Common)  . Skin and tissue irritation may involve redness, pain, warmth, or swelling at the IV site. This happens if  the drug leaks out of the vein and into nearby tissue.  . Changes in your liver function. Your doctor will check your liver function as needed.  . This drug may cause an increased risk of developing a second cancer  Allergic Reaction  Serious allergic reactions, including anaphylaxis are rare. While you are getting this drug in your vein (IV), tell your nurse right away if you have any of these symptoms of an allergic reaction:  . Trouble catching your breath  . Feeling like your tongue or throat are swelling  . Feeling your heart beat quickly or in a not normal way (palpitations)  . Feeling dizzy or lightheaded  . Flushing, itching, rash, and/or hives   Treating Side Effects  . Drink 6-8 cups of fluids every day unless your doctor has told you to limit your fluid intake due to some other health problem. A cup is 8 ounces of fluid. If you vomit or have diarrhea, you should drink more fluids so that you do not become dehydrated (lack water in the body due to losing too much fluid).  . Ask your doctor or nurse about medicine that is available to help stop or lessen nausea, throwing up, and/or loose bowel movements  . Wear dark sunglasses and use sunscreen with SPF 30 or higher when you are outdoors even for a short time. Cover up when you are out in the sun. Wear wide-brimmed hats, long-sleeved shirts, and pants. Keep your neck, chest, and back covered.  . Mouth care is very important. Your mouth care should consist of routine, gentle cleaning of your teeth or dentures and rinsing your mouth with a mixture of 1/2 teaspoon of salt in 8 ounces of water or  teaspoon of baking soda in 8 ounces of water. This should be done at least after each meal and at bedtime.  . If you have mouth sores, avoid mouthwash that has alcohol. Avoid alcohol and smoking because they can bother your mouth and throat.  . Talk with your nurse about getting a wig before you lose your hair. Also, call the  [facility] at 800-ACS-2345 to find out information about the "Look Good, Feel Better" program close to where you live. It is a free program where women getting chemotherapy can learn about wigs, turbans and scarves as well as makeup techniques and skin and nail care.  . While you are getting this drug, please tell your nurse right away if you have any pain, redness, or swelling at the site of the IV infusion.   Food and Drug Interactions  There are no known interactions of doxorubicin with food. This drug may interact with other medicines. Tell your doctor and pharmacist about all the medicines and dietary supplements (vitamins, minerals, herbs and others) that you are taking at this time. The safety and use of dietary supplements and alternative diets are often not known. Using these might affect your cancer or interfere with your treatment. Until more is known, you should not use dietary supplements or alternative diets  without your cancer doctor's help.   When to Call the Doctor  Call your doctor or nurse right away if you have any of these symptoms:  . Fever of 100.4 F (38 C) or above  . Chills  . Easy bruising or bleeding  . Wheezing or trouble breathing  . Rash or itching  . Feeling dizzy or lightheaded  . Feeling that your heart is beating in a fast or not normal way (palpitations)  . Loose bowel movements (diarrhea) more than 4 times a day or diarrhea with weakness or feeling lightheaded  . Nausea that stops you from eating or drinking  . Throwing up/vomiting  . Signs of liver problems: dark urine, pale bowel movements, bad stomach pain, feeling very tired and weak, unusual itching, or yellowing of the eyes or skin,  . During the IV infusion, if you have pain, redness, or swelling at the site of the IV infusion, please tell your nurse right away   Call your doctor or nurse as soon as possible if any of these symptoms happen:  . Decreased urine  . Pain  in your mouth or throat that makes it hard to eat or drink  . Nausea and throwing up that is not relieved by prescribed medicines  . Rash that is not relieved by prescribed medicines  . Swelling of legs, ankles, or feet  . Weight gain of 5 pounds in one week (fluid retention)  . Lasting loss of appetite or rapid weight loss of five pounds in a week  . Fatigue that interferes with your daily activities  . Extreme weakness that interferes with normal activities   Sexual Problems and Reproduction Concerns  . Infertility warning: Sexual problems and reproduction concerns may happen. In both men and women, this drug may affect your ability to have children. This cannot be determined before your treatment. Talk with your doctor or nurse if you plan to have children. Ask for information on sperm or egg banking.  . In men, this drug may interfere with your ability to make sperm, but it should not change your ability to have sexual relations.  . In women, menstrual bleeding may become irregular or stop while you are getting this drug. Do not assume that you cannot become pregnant if you do not have a menstrual period.  . Women may go through signs of menopause (change of life) like vaginal dryness or itching. Vaginal lubricants can be used to lessen vaginal dryness, itching, and pain during sexual relations.  . Genetic counseling is available for you to talk about the effects of this drug therapy on future pregnancies. Also, a genetic counselor can look at the possible risk of problems in the unborn baby due to this medicine if an exposure happens during pregnancy.  . Pregnancy warning: This drug may have harmful effects on the unborn baby, so effective methods of birth control should be used by you and your partner during your cancer treatment and for at least 6 months after treatment  . Breast feeding warning: Women should not breast feed during treatment because this drug could enter the  breast milk and badly harm a breast feeding baby.   Vincristine sulfate (Generic Name) Other Names: Oncovin, Vincasar, VCR  About This Drug  Vincristine is a drug used to treat cancer. It is given in the vein (IV).  This drug will take 10 minutes to infuse.   Possible Side Effects (More Common)  . Constipation (not able to move bowels)  .  Hair loss. You may notice hair getting thin. Some patients lose their hair. Your hair often grows back when treatment is done. Most often hair loss is temporary; your hair should grow back when treatment is done.  . Effects on the nerves are called peripheral neuropathy. You may feel numbness, tingling, or pain in your hands and feet. It may be hard for you to button your clothes, open jars, or walk as usual. The effect on the nerves may get worse with more doses of the drug. These effects get better in some people after the drug is stopped but it does not get better in all people.   Possible Side Effects (Less Common)  . Soreness of the mouth and throat. You may have red areas, white patches, or sores that hurt.  . Swelling of your legs, ankles, and/or feet  . Skin and tissue irritation may involve redness, pain, warmth, or swelling at the IV site. This happens if the drug leaks out of the vein and into nearby tissue.  . Blood pressure changes. Some patients have low blood pressure and some patients have high blood pressure.  . Jaw pain  . Hoarseness  . Blurred or double vision or other changes in eyesight may happen but are a rare side effect.  . Feeling dizzy or lightheaded  . Drooping eyelids are a rare side effect.  . Depression  . Rash  . Bone marrow depression. This is a decrease in the number of white blood cells, red blood cells, and platelets. This may raise your risk of infection, make you tired and weak (fatigue), and raise your risk of bleeding.  . Chest pain or symptoms of a heart attack. Most heart attacks involve pain  in the center of the chest that lasts more than a few minutes. The pain may go away and come back or it can be constant. It can feel like pressure, squeezing, fullness, or pain. Sometimes pain is felt in one or both arms, the back, neck, jaw, or stomach. If any of these symptoms last 2 minutes, call 911.  . Trouble sleeping  . Lack of appetite; weight loss   Allergic Reactions  Serious allergic reactions including anaphylaxis are rare. While you are getting this drug in your vein (IV), tell your nurse right away if you have any of these symptoms of an allergic reaction:  . Trouble catching your breath  . Feeling like your tongue or throat are swelling  . Feeling your heart beat quickly or in a not normal way (palpitations)  . Feeling dizzy or lightheaded  . Flushing, itching, rash, and/or hives   Treating Side Effects  . While you are getting this drug in your IV, please tell your nurse right away if you have any pain, redness, or swelling at the site of the IV infusion.  . Ask your doctor or nurse how to prevent or lessen constipation. Constipation can become a serious side effect. If you are constipated, check with your doctor or nurse before you use enemas, laxatives, or suppositories.  . Drink 6-8 cups of fluids each day unless your doctor has told you to limit your fluid intake due to some other health problem. A cup is 8 ounces of fluid. If you throw up or have loose bowel movements, you should drink more fluids so that you do not become dehydrated (lack water in the body from losing too much fluid).  . Mouth care is very important. Your mouth care should consist of  gently cleaning your teeth or dentures and rinsing your mouth with a mixture of  teaspoon salt in 8 ounces of water or  teaspoon sodium bicarbonate (baking soda) in 8 ounces of water. This should be done at least after each meal and at bedtime.  . If you have mouth sores, avoid mouthwash that has alcohol. Also  avoid alcohol and smoking because they can bother your mouth and throat.  . If you get a rash do not put anything on it unless your doctor or nurse says you may. Keep the area around the rash clean and dry. Ask your doctor for medicine if your rash bothers you.  . If you have numbness and tingling in your hands and feet, be careful when cooking, walking, and handling sharp objects and hot liquids.  . Talk with your nurse about getting a wig before you lose your hair. Also, call the [facility] at 800-ACS-2345 to find out information about the "Look Good, Feel Better" program close to where you live. It is a free program where women getting chemotherapy can learn about wigs, turbans and scarves as well as makeup techniques and skin and nail care.   Food and Drug Interactions  . There are known interactions of Vincristine with grapefruit. Do not eat grapefruit or drink grapefruit juice while taking this drug.  . Check with your doctor or pharmacist about all other prescription medicines and dietary supplements you are taking before starting this medicine as there are a lot of known drug interactions with Vincristine. Also, check with your doctor or pharmacist before starting any new prescription, over-the-counter medicines, or dietary supplement to make sure that there are no interactions.   When to Call the Doctor  Call your doctor or nurse right away if you have any of these symptoms:  . Redness, pain, warmth, or swelling at the IV site while you are getting this drug  . No bowel movement in 3 days or when you feel uncomfortable.  . Abdominal pain  . Fever of 100.4 F (38 C) or higher  . Chills  . Feeling short of breath  . Easy bleeding or bruising  . Jaw pain  . Hoarseness  . Drooping eyelids  . Blurred vision or other changes in eyesight  . Feeling confused, restless, or irritable  . Seizures (Common symptoms of a seizure can include confusion, blacking out,  passing out, loss of hearing or vision, blurred vision, unusual smells or tastes (such as burning rubber), trouble talking, tremors or shaking in parts or all of the body, repeated body movements, tense muscles that do not relax, and loss of control of urine and bowels. There are other less common symptoms of seizures. If you or your family member suspects you are having a seizure, call 911 right away).  . Hallucinations (seeing, hearing, or feeling things that are not really there)  . Feeling  . Rash  . Chest pain or symptoms of a heart attack. Most heart attacks involve pain in the center of the chest that lasts more than a few minutes. The pain may go away and come back. It can feel like pressure, squeezing, fullness, or pain. Sometimes pain is felt in one or both arms, the back, neck, jaw, or stomach. If any of these symptoms last 2 minutes, call 911.  Call your doctor or nurse as soon as possible if you have any of these symptoms:  . Pain in fingers, toes, joints, or testicles  . Numbness, tingling,  or decreased feeling or weakness in fingers, toes, arms, or legs  . Trouble walking or changes in the way you walk  . Swelling of your legs, ankles, and/or feet  . Headache  . Pain in your mouth or throat that makes it hard to eat or drink  . Loss of appetite or weight loss  . Hearing changes  . Depression  . Trouble sleeping   Reproduction Concerns  . Women may go through signs of menopause (change of life) like vaginal dryness or itching. Vaginal lubricants can be used to lessen vaginal dryness, itching, and pain during sexual relations.  . Pregnancy warning: This drug may have harmful effects on the unborn child, so effective methods of birth control should be used during your cancer treatment.  . Breast feeding warning: It is not known if this drug passes into breast milk. For this reason, women should talk to their doctor about the risks and benefits of breast feeding  during treatment with this drug because this drug may enter the breast milk and badly harm a breast feeding baby.   Cyclophosphamide (Generic Name) Other Names: Cytoxan, Neosar  About This Drug  Cyclophosphamide is a drug used to treat cancer. It is given in the vein (IV) or by mouth. This drug takes 30 minutes to infuse.  Possible Side Effects (More Common)  . Nausea and throwing up (vomiting). These symptoms may happen within a few hours after your treatment and may last up to 72 hours. Medicines are available to stop or lessen these side effects.  . Bone marrow depression. This is a decrease in the number of white blood cells, red blood cells, and platelets. This may raise your risk of infection, make you tired and weak (fatigue), and raise your risk of bleeding.  . Hair loss: You may notice hair getting thin. Some patients lose their hair. Hair loss is often complete scalp hair loss and can involve loss of eyebrows, eyelashes, and pubic hair. You may notice this a few days or weeks after treatment has started. Most often hair loss is temporary; your hair should grow back when treatment is done.  . Decreased appetite (decreased hunger)  . Blurred vision  . Soreness of the mouth and throat. You may have red areas, white patches, or sores that hurt.  . Effects on the bladder. This drug may cause irritation and bleeding in the bladder. You may have blood in your urine. To help stop this, you will get extra fluids to help you pass more urine. You may get a drug called mesna, which helps to decrease irritation and bleeding. You may also get a medicine to help you pass more urine. You may have a catheter (tube) placed in your bladder so that your bladder will be washed with this drug.  Possible Side Effects (Less Common)  . Darkening of the skin or nails  . Metallic taste in the mouth  . Changes in lung tissue may happen with large amounts of this drug. These changes may not last  forever, and your lung tissue may go back to normal. Sometimes these changes may not be seen for many years. You may get a cough or have trouble catching your breath.   Allergic Reactions  Serious allergic reactions including anaphylaxis are rare. While you are getting this drug in your vein (IV), tell your nurse right away if you have any of these symptoms of an allergic reaction:  . Trouble catching your breath  . Feeling like  your tongue or throat are swelling  . Feeling your heart beat quickly or in a not normal way (palpitations)  . Feeling dizzy or lightheaded  . Flushing, itching, rash, and/or hives   Treating Side Effects  . Drink 6-8 cups of fluids each day unless your doctor has told you to limit your fluid intake due to some other health problem. A cup is 8 ounces of fluid. If you throw up or have loose bowel movements you should drink more fluids so that you do not become dehydrated (lack water in the body due to losing too much fluid).  . Ask your doctor or nurse about medicine that is available to help stop or lessen nausea or throwing up.  . Mouth care is very important. Your mouth care should consist of routine, gentle cleaning of your teeth or dentures and rinsing your mouth with a mixture of 1/2 teaspoon of salt in 8 ounces of water or  teaspoon of baking soda in 8 ounces of water. This should be done at least after each meal and at bedtime.  . If you have mouth sores, avoid mouthwash that has alcohol. Also avoid alcohol and smoking because they can bother your mouth and throat.  . Talk with your nurse about getting a wig before you lose your hair. Also, call the [facility] at 800-ACS-2345 to find out information about the " Look Good.Marland KitchenMarland KitchenFeel Better" program close to where you live. It is a free program where women undergoing chemotherapy learn about wigs, turbans and scarves as well as makeup techniques and skin and nail care.  Important Information  .  Whenever you tell a doctor or nurse your health history, always tell them that you have received cyclophosphamide in the past.  . If you take this drug by mouth swallow the medicine whole. Do not chew, break or crush it.  . You can take the medicine with or without food. If you have nausea, take it with food. Do not take the pills at bedtime.  Food and Drug Interactions  There are no known interactions of cyclophosphamide with food. This drug may interact with other medicines. Tell your doctor and pharmacist about all the medicines and dietary supplements (vitamins, minerals, herbs and others) that you are taking at this time. The safety and use of dietary supplements and alternative diets are often not known. Using these might affect your cancer or interfere with your treatment. Until more is known, you should not use dietary supplements or alternative diets without your cancer doctor's help.   When to Call the Doctor  Call your doctor or nurse right away if you have any of these symptoms:  . Fever of 100.4 F (38 C) or higher  . Chills  . Bleeding or bruising that is not normal  . Blurred vision or other changes in eyesight  . Pain when passing urine; blood in urine  . Pain in your lower back or side  . Wheezing or trouble breathing  . Swelling of legs, ankles, or feet  . Feeling dizzy or lightheaded  . Feeling confused or agitated  . Signs of liver problems: dark urine, pale bowel movements, bad stomach pain, feeling very tired and weak, unusual itching, or yellowing of the eyes or skin  . Unusual thirst or passing urine often  . Nausea that stops you from eating or drinking  . Throwing up/vomiting  Call your doctor or nurse as soon as possible if any of these symptoms happen:  .  Pain in your mouth or throat that makes it hard to eat or drink  . Nausea not relieved by prescribed medicines   Sexual Problems and Reproductive Concerns  . Infertility warning: Sexual  problems and reproduction concerns may happen. In both men and women, this drug may affect your ability to have children. This cannot be determined before your treatment. Talk with your doctor or nurse if you plan to have children. Ask for information on sperm or egg banking.  . In men, this drug may interfere with your ability to make sperm, but it should not change your ability to have sexual relations.  . In women, menstrual bleeding may become irregular or stop while you are getting this drug. Do not assume that you cannot become pregnant if you do not have a menstrual period.  . Women may go through signs of menopause (change of life) like vaginal dryness or itching. Vaginal lubricants can be used to lessen vaginal dryness, itching, and pain during sexual relations.  . Genetic counseling is available for you to talk about the effects of this drug therapy on future pregnancies. Also, a genetic counselor can look at the possible risk of problems in the unborn baby due to this medicine if an exposure happens during pregnancy.  . Pregnancy warning: This drug may have harmful effects on the unborn child, so effective methods of birth control should be used during your cancer treatment.  . Breast feeding warning: Women should not breast feed during treatment because this drug could enter the breast milk and badly harm a breast feeding baby.   Rituximab (Generic Name) Other Name: Rituxan  About This Drug  Rituximab is a monoclonal antibody used to treat cancer. This drug is given in the vein (IV).  This first time this is given it will be infused slower to monitor for infusion reactions.    Possible Side Effects (More Common)  . Bone marrow depression. This is a decrease in the number of white blood cells, red blood cells, and platelets. This  may raise your risk of infection, make you tired and weak (fatigue), and raise your risk of bleeding.  . Rash-skin irritation, redness or itching  (dermatitis)  . Flu-like symptoms: fever, headache, muscle and joint aches, and fatigue (low energy, feeling weak)  . Infusion-related reactions  . Hepatitis B - if you have ever had hepatitis B, the virus may come back during treatment with this drug. Your doctor will test to see if you have ever had hepatitis B prior to your treatment.  . Changes in your central nervous system can happen. The central nervous system is made up of your brain and spinal cord. You could feel: extreme tiredness, agitation, confusion, or have: hallucinations (see or hear things that are not there), trouble understanding or speaking, loss of control of your bowels or bladder, eyesight changes, numbness or lack of strength to your arms, legs, face, or body, seizures or coma. If you start to have any of these symptoms let your doctor know right away.  . Tumor lysis: This drug may act on the cancer cells very quickly. This may affect how your kidneys work. Your doctor will monitor your kidney function.  . Changes in your liver function. Your doctor will check your liver function as needed.  . Nausea and throwing up (vomiting): these symptoms may happen within a few hours after your treatment and may last up to 24 hours. Medicines are available to stop or lessen these side effects.  Marland Kitchen  Loose bowel movements (diarrhea) that may last for a few days  . Abdominal pain  . Infections  . Cough, runny nose  . Swelling of your legs, ankles and/or feet or hands  . High blood pressure. Your doctor will check your blood pressure as needed.  . Abnormal heart beat  Possible Side Effects (Less Common)  . Shortness of breath  . Soreness of the mouth and throat. You may have red areas, white patches, or sores that hurt.  Infusion Reactions  Infusion Reactions are the most common side effect linked to use of this drug and can be quite severe. Medicines will be given before you get the drug to lower the severity of this  side effect. The infusion reactions are the worse with the first dose of the drug and become less severe with more doses of the drug. While you are getting this drug in your vein (IV), tell your nurse right away if you have any of these symptoms of an allergic reaction:  . Trouble catching your breath  . Feeling like your tongue or throat are swelling  . Feeling your heart beat quickly or in a not normal way (palpitations)  . Feeling dizzy or lightheaded  . Flushing, itching, rash, and/or hives   Treating Side Effects  . Ask your doctor or nurse about medicine to stop or lessen headache, loose bowel movements (diarrhea), constipation, nausea, throwing up (vomiting), or pain.  . If you get a rash do not put anything it unless your doctor or nurse says you may. Keep the area around the rash clean and dry. Ask your doctor for medicine if the rash bothers you.  . Drink 6-8 cups of fluids each day unless your doctor has told you to limit your fluid intake due to some other health problem. A cup is 8 ounces of fluid. If you throw up or have loose bowel movements, you should drink more fluids so that you do not become dehydrated (lack of water in the body from losing too much fluid).  . If you are not able to move your bowels, check with your doctor or nurse before you use enemas, laxatives, or suppositories  . If you have mouth sores, avoid mouthwash that has alcohol. Also avoid alcohol and smoking because they can bother your mouth and throat.  . If you have a nose bleed, sit with your head tipped slightly forward. Apply pressure by lightly pinching the bridge of your nose between your thumb and forefinger. Call your doctor if you feel dizzy or faint or if the bleeding doesn't stop after 10 to 15 minutes   Important Information  . After treatment with this drug, vaccination with live viruses should be delayed until the immune system recovers.  . Symptoms of abnormal bleeding may be:  coughing up blood, throwing up blood (may look like coffee grounds), red or black, tarry bowel movements, blood in urine, abnormally heavy menstrual flow, nosebleeds, or any unusual bleeding.  . Symptoms of high blood pressure may be: headache, blurred vision, confusion, chest pain, or a feeling that your heart is beat differently.  Marland Kitchen Urinary tract infection. Symptoms may include:  . Pain or burning when you pass urine  . Feeling like you have to pass urine often, but not much comes out when you do.  . Tender or heavy feeling in your lower abdomen  . Cloudy urine and/or urine that smells bad.  . Pain on one side of your back under your ribs.  This is where your kidneys are.  . Fever, chills, nausea and/or throwing up   Food and Drug Interactions  There are no known interactions of rituximab and any food. This drug may interact with other medicines. Tell your doctor and pharmacist about all the medicines and dietary supplements (vitamins, minerals, herbs and others) that you are taking at this time. The safety and use of dietary supplements and alternative diets are often not known. Using these might affect your cancer or interfere with your treatment. Until more is known, you should not use dietary supplements or alternative diets without your cancer doctor's help.   When to Call the Doctor  Call your doctor or nurse right away if you have any of these symptoms:  . Fever of 100.4 F (38 C) higher  . Chills  . Trouble breathing  . Rash with or without itching  . Blistering or peeling of skin  . Chest pain or symptoms of a heart attack. Most heart attacks involve pain in the center of the chest that lasts more than a few minutes. The pain may go away and come back or it can be constant. It can feel like pressure, squeezing, fullness, or pain. Sometimes pain is felt in one or both arms, the back, neck, jaw, or stomach. If any of these symptoms last 2 minutes, call 911  . Easy  bleeding or bruising  . Blood in urine or bowel movements  . Feeling that your heart is beating in a fast or not normal way (palpitations)  . Nausea that stops you from eating or drinking  . Throwing up/vomiting  . Abdominal pain  . Loose bowel movements (diarrhea) 4 times in one day or diarrhea with weakness or lightheadedness  . No bowel movement in 3 days or if you feel uncomfortable  . Feeling dizzy or lightheaded  . Changes in your speech or vision  . Feeling confused  . Weakness of your arms and legs or poor coordination (feeling clumsy)  . Signs of liver problems: dark urine, pale bowel movements, bad stomach pain, feeling very tired and weak, unusual itching, or yellowing of skin or eyes  . Symptoms of a urinary tract infection (see important information)   Call your doctor or nurse as soon as possible if you have any of these symptoms:  . Swelling of your legs, ankles and/or feet  . Fatigue and /or weakness that interferes with your daily activities  . Joint and muscle pain or muscle spasms that are not relieved by prescribed medicines  . Cough that lasts longer than normal    Reproduction Concerns  . Pregnancy warning: This drug is known to cross the placenta. This drug may have harmful effects on an unborn baby. Effective methods of birth control should be used during treatment with this drug and for 12 months after the last treatment. If exposure occurs to an unborn baby, the baby's immune system may be affected, which could last for months after birth. Until the immune system recovers, live vaccines should not be administered to the baby. Be sure to talk with your doctor if you are pregnant or planning to become pregnant while getting this drug.  . Breast feeding warning: It is not known if rituximab is passed into human breast milk. In animal studies, this drug was detected in in breast milk. For this reason, women should talk to their doctor about the risks  and benefits of breast feeding during treatment with this drug because this drug  may enter the breast milk and badly harm a breast feeding baby.  Prednisone  About This Drug  Prednisone is a steroid that may be used in high doses to treat cancer. It is given orally (by mouth). You will take this medication starting on day 1 of treatment and then for the four days following treatment.  You only need to take this medication during the week of each treatment.   Possible Side Effects  . Abnormal heart beat  . Headache  . High blood pressure  . Increased sweating  . Blurred vision or other changes in eyesight  . Tiredness and weakness  . Changes in mood, which may include depression or a feeling of extreme well-being  . Trouble sleeping  . Feeling restless (unable to relax)  . Skin changes such as rash, dryness, or redness  . Increased appetite (increased hunger)  . Weight gain  . Electrolyte changes  . Increased risk of infections  . Aggravation of stomach ulcers  . Pain in your abdomen  . Nausea  . Blood sugar levels may change  . Swelling of your legs, ankles and/or feet  . Muscle loss and/or weakness (lack of muscle strength)  . Increased risk of developing osteoporosis- your bones may become weak and brittle  . Increased risk for cataracts, glaucoma or infections of the eye  . Changes in your liver function  Note: Not all possible side effects are included above.  Warnings and Precautions  . High blood pressure and changes in electrolytes, which can cause fluid build-up around your heart, lungs or elsewhere.  . Severe infections, which can be life-threatening.  . Allergic reactions, including anaphylaxis are rare but may happen in some patients. Signs of allergic reaction to this drug may be swelling of the face, feeling like your tongue or throat are swelling, trouble breathing, rash, itching, fever, chills, feeling dizzy, and/or feeling that your  heart is beating in a fast or not normal way. If this happens, do not take another dose of this drug. You should get urgent medical treatment.  . Increased risk of developing a hole in your stomach, small, and/or large intestine if you have ulcers in the lining of your stomach and/or intestine, or have diverticulitis, ulcerative colitis and/or other diseases that affect the gastrointestinal tract.  . Blurred vision or other changes in eyesight.  . Severe depression and other psychiatric disorders such as mood changes.  . Effects on the endocrine glands including pituitary, adrenals and thyroid during or after use of this medication.  Note: Some of the side effects above are very rare. If you have concerns and/or questions, please discuss them with your medical team.  Important Information  . Talk to your doctor or your nurse before stopping this medication, it should be stopped gradually. Depending on the dose and length of treatment, you could experience serious side effects if stopped abruptly (suddenly).  . Talk to your doctor before receiving any vaccinations during your treatment. Some vaccinations are not recommended while receiving prednisone.  How to Take Your Medication  . For Oral (by mouth): You can take the medicine with or without food, but it is preferable to be taken with food, especially If you have nausea or upset stomach.  . Missed dose: If you miss or vomit a dose, contact your physician. Do not take 2 doses at the same time and do not double up on the next dose.  Marland Kitchen Handling: Wash your hands after handling your medicine, your  caretakers should not handle your medicine with bare hands and should wear latex gloves.  . Storage: Store this medicine in the original container at room temperature, protect from moisture. Discuss with your nurse or your doctor how to dispose of unused medicine.   Treating Side Effects  . Drink plenty of fluids (a minimum of eight  glasses per day is recommended).  . To help with nausea and vomiting, eat small, frequent meals instead of three large meals a day. Choose foods and drinks that are at room temperature. Ask your nurse or doctor about other helpful tips and medicine that is available to help stop or lessen these symptoms.  . If you throw up, you should drink more fluids so that you do not become dehydrated (lack of water in the body from losing too much fluid).  . Manage tiredness by pacing your activities for the day. Be sure to include periods of rest between energy-draining activities.  . To help with muscle weakness, get regular exercise. If you feel too tired to exercise vigorously, try taking a short walk.  . If you are having trouble sleeping, talk to your nurse or doctor on tips to help you sleep better.  . If you are feeling depressed, talk to your nurse or doctor about it.  Marland Kitchen Keeping your pain under control is important to your well-being. Please tell your doctor or nurse if you are experiencing pain.  . If you have diabetes, keep good control of your blood sugar level. Tell your nurse or your doctor if your glucose levels are higher or lower than normal.  . To decrease the risk of infection, wash your hands regularly.  . Avoid close contact with people who have a cold, the flu, or other infections.  . Take your temperature as your doctor or nurse tells you, and whenever you feel like you may have a fever.  . If you get a rash do not put anything on it unless your doctor or nurse says you may. Keep the area around the rash clean and dry. Ask your doctor for medicine if your rash bothers you.  . Moisturize your skin several times a day  . Avoid sun exposure and apply sunscreen routinely when outdoors.   Food and Drug Interactions  . This drug may interact with grapefruit and grapefruit juice. Talk to your doctor as this could make side effects worse.  . Check with your doctor or  pharmacist about all other prescription medicines and over-the-counter medicines and dietary supplements (vitamins, minerals, herbs and others) you are taking before starting this medicine as there are known drug interactions with prednisone. Also, check with your doctor or pharmacist before starting any new prescription or over-the-counter medicines, or dietary supplements to make sure that there are no interactions.  . There are known interactions of prednisone with other medicines and products like aspirin, and other nonsteroidal anti-inflammatory agents. Ask your doctor what over-the-counter (OTC) medicines you can take.  . There are known interactions of prednisone with blood thinning medicine such as warfarin. Ask your doctor what precautions you should take.  Marland Kitchen Avoid the use of St. John's Wort while taking prednisone as this may lower the levels of the drug in your body, which can make it less effective.   When to Call the Doctor  Call your doctor or nurse if you have any of these symptoms and/or any new or unusual symptoms:  . Fever of 100.4 F (38 C) or higher  .  Chills  . A headache that does not go away  . Blurry vision or other changes in eyesight  . Feel irritable, nervous or restless  . Trouble sleeping  . Tiredness or weakness that interferes with your daily activities  . Trouble breathing  . Feeling that your heart is beating in a fast or not normal way (palpitations)  . Chest pain or symptoms of a heart attack. Most heart attacks involve pain in the center of the chest that lasts more than a few minutes. The pain may go away and come back or it can be constant. It can feel like pressure, squeezing, fullness, or pain. Sometimes pain is felt in one or both arms, the back, neck, jaw, or stomach. If any of these symptoms last 2 minutes, call 911.  . Nausea that stops you from eating or drinking and/or is not relieved by prescribed medicines  . Throwing up  more than 3 times a day  . Severe abdominal pain that does not go away  . Heartburn or indigestion  . Abnormal blood sugar  . Severe mood changes such as depression or unusual thoughts and/or behaviors  . Thoughts of hurting yourself or others, and suicide  . Feeling hopeless on most days  . Unusual thirst, passing urine often, headache, sweating, shakiness, irritability  . Swelling of legs, ankles, or feet  . Weight gain of 5 pounds in one week (fluid retention)  . A new rash or a rash that is not relieved by prescribed medicines  . Signs of possible liver problems: dark urine, pale bowel movements, bad stomach pain, feeling very tired and weak, unusual itching, or yellowing of the eyes or skin  . Signs of allergic reaction: swelling of the face, feeling like your tongue or throat are swelling, trouble breathing, rash, itching, fever, chills, feeling dizzy, and/or feeling that your heart is beating in a fast or not normal way. If this happens, call 911 for emergency care.  . If you think you may be pregnant  Reproduction Warnings  . Pregnancy warning: It is not known if this drug may harm an unborn child. For this reason, be sure to talk with your doctor if you are pregnant or planning to become pregnant while receiving this drug. Let your doctor know right away if you think you may be pregnant.  . Breastfeeding warning: It is not known if this drug passes into breast milk. For this reason, women should talk to their doctor about the risks and benefits of breastfeeding during treatment with this drug because this drug may enter the breast milk and cause harm to a breastfeeding baby.  . Fertility warning: Human fertility studies have not been done with this drug. Talk with your doctor or nurse if you plan to have children. Ask for information on sperm or egg banking.  You will get Neulasta (or similar) after every treatment of chemo:  Neulasta (or similar) - this  medication is not chemotherapy but is being given because you have had chemo. It is usually given 24-48 hours after the completion of chemotherapy. This medication works by boosting your bone marrow's supply of white blood cells. White blood cells are what protect our bodies against infection. The medication is given in the form of a subcutaneous injection. It is given in the fatty tissue of your abdomen, or in the skin on the back of your arm . It is a short needle. The major side effect of this medication is bone or muscle  pain. The drug of choice to relieve or lessen the pain is Aleve or Ibuprofen. If a physician has ever told you not to take Aleve or Ibuprofen - then don't take it. You should then take Tylenol/acetaminophen. Take either medication as the bottle directs you to.  The level of pain you experience as a result of this injection can range from none, to mild or moderate, or severe. Please let us know if you develop moderate or severe bone pain.   You can take Claritin 10 mg over the counter for a few days after receiving Neulasta to help with the bone aches and pains.      SELF CARE ACTIVITIES WHILE RECEIVING CHEMOTHERAPY:  Hydration Increase your fluid intake 48 hours prior to treatment and drink at least 8 to 12 cups (64 ounces) of water/decaffeinated beverages per day after treatment. You can still have your cup of coffee or soda but these beverages do not count as part of your 8 to 12 cups that you need to drink daily. No alcohol intake.  Medications Continue taking your normal prescription medication as prescribed.  If you start any new herbal or new supplements please let us know first to make sure it is safe.  Mouth Care Have teeth cleaned professionally before starting treatment. Keep dentures and partial plates clean. Use soft toothbrush and do not use mouthwashes that contain alcohol. Biotene is a good mouthwash that is available at most pharmacies or may be ordered by calling  [phone]. Use warm salt water gargles (1 teaspoon salt per 1 quart warm water) before and after meals and at bedtime. If you need dental work, please let the doctor know before you go for your appointment so that we can coordinate the best possible time for you in regards to your chemo regimen. You need to also let your dentist know that you are actively taking chemo. We may need to do labs prior to your dental appointment.  Skin Care Always use sunscreen that has not expired and with SPF (Sun Protection Factor) of 50 or higher. Wear hats to protect your head from the sun. Remember to use sunscreen on your hands, ears, face, & feet.  Use good moisturizing lotions such as udder cream, eucerin, or even Vaseline. Some chemotherapies can cause dry skin, color changes in your skin and nails.    . Avoid long, hot showers or baths. . Use gentle, fragrance-free soaps and laundry detergent. . Use moisturizers, preferably creams or ointments rather than lotions because the thicker consistency is better at preventing skin dehydration. Apply the cream or ointment within 15 minutes of showering. Reapply moisturizer at night, and moisturize your hands every time after you wash them.  Hair Loss (if your doctor says your hair will fall out)  . If your doctor says that your hair is likely to fall out, decide before you begin chemo whether you want to wear a wig. You may want to shop before treatment to match your hair color. . Hats, turbans, and scarves can also camouflage hair loss, although some people prefer to leave their heads uncovered. If you go bare-headed outdoors, be sure to use sunscreen on your scalp. . Cut your hair short. It eases the inconvenience of shedding lots of hair, but it also can reduce the emotional impact of watching your hair fall out. . Don't perm or color your hair during chemotherapy. Those chemical treatments are already damaging to hair and can enhance hair loss. Once your chemo  treatments are done and your hair has grown back, it's OK to resume dyeing or perming hair.  With chemotherapy, hair loss is almost always temporary. But when it grows back, it may be a different color or texture. In older adults who still had hair color before chemotherapy, the new growth may be completely gray.  Often, new hair is very fine and soft.  Infection Prevention Please wash your hands for at least 30 seconds using warm soapy water. Handwashing is the #1 way to prevent the spread of germs. Stay away from sick people or people who are getting over a cold. If you develop respiratory systems such as green/yellow mucus production or productive cough or persistent cough let us know and we will see if you need an antibiotic. It is a good idea to keep a pair of gloves on when going into grocery stores/[employer] to decrease your risk of coming into contact with germs on the carts, etc. Carry alcohol hand gel with you at all times and use it frequently if out in public. If your temperature reaches 100.4 or higher please call the clinic and let us know.  If it is after hours or on the weekend please go to the ER if your temperature is over 100.4.  Please have your own personal thermometer at home to use.    Sex and bodily fluids If you are going to have sex, a condom must be used to protect the person that isn't taking chemotherapy. Chemo can decrease your libido (sex drive). For a few days after chemotherapy, chemotherapy can be excreted through your bodily fluids.  When using the toilet please close the lid and flush the toilet twice.  Do this for a few day after you have had chemotherapy.   Effects of chemotherapy on your sex life Some changes are simple and won't last long. They won't affect your sex life permanently.  Sometimes you may feel: . too tired . not strong enough to be very active . sick or sore  . not in the mood . anxious or low  Your anxiety might not seem related to sex. For  example, you may be worried about the cancer and how your treatment is going. Or you may be worried about money, or about how you family are coping with your illness. These things can cause stress, which can affect your interest in sex. It's important to talk to your partner about how you feel. Remember - the changes to your sex life don't usually last long. There's usually no medical reason to stop having sex during chemo. The drugs won't have any long term physical effects on your performance or enjoyment of sex. Cancer can't be passed on to your partner during sex  Contraception It's important to use reliable contraception during treatment. Avoid getting pregnant while you or your partner are having chemotherapy. This is because the drugs may harm the baby. Sometimes chemotherapy drugs can leave a man or woman infertile.  This means you would not be able to have children in the future. You might want to talk to someone about permanent infertility. It can be very difficult to learn that you may no longer be able to have children. Some people find counselling helpful. There might be ways to preserve your fertility, although this is easier for men than for women. You may want to speak to a fertility expert. You can talk about sperm banking or harvesting your eggs. You can also ask about other fertility options,  such as donor eggs. If you have or have had breast cancer, your doctor might advise you not to take the contraceptive pill. This is because the hormones in it might affect the cancer.  It is not known for sure whether or not chemotherapy drugs can be passed on through semen or secretions from the vagina. Because of this some doctors advise people to use a barrier method if you have sex during treatment. This applies to vaginal, anal or oral sex. Generally, doctors advise a barrier method only for the time you are actually having the treatment and for about a week after your treatment. Advice like this  can be worrying, but this does not mean that you have to avoid being intimate with your partner. You can still have close contact with your partner and continue to enjoy sex.  Animals If you have cats or birds we just ask that you not change the litter or change the cage.  Please have someone else do this for you while you are on chemotherapy.   Food Safety During and After Cancer Treatment Food safety is important for people both during and after cancer treatment. Cancer and cancer treatments, such as chemotherapy, radiation therapy, and stem cell/bone marrow transplantation, often weaken the immune system. This makes it harder for your body to protect itself from foodborne illness, also called food poisoning. Foodborne illness is caused by eating food that contains harmful bacteria, parasites, or viruses.  Foods to avoid Some foods have a higher risk of becoming tainted with bacteria. These include: Marland Kitchen Unwashed fresh fruit and vegetables, especially leafy vegetables that can hide dirt and other contaminants . Raw sprouts, such as alfalfa sprouts . Raw or undercooked beef, especially ground beef, or other raw or undercooked meat and poultry . Fatty, fried, or spicy foods immediately before or after treatment.  These can sit heavy on your stomach and make you feel nauseous. . Raw or undercooked shellfish, such as oysters. . Sushi and sashimi, which often contain raw fish.  . Unpasteurized beverages, such as unpasteurized fruit juices, raw milk, raw yogurt, or cider . Undercooked eggs, such as soft boiled, over easy, and poached; raw, unpasteurized eggs; or foods made with raw egg, such as homemade raw cookie dough and homemade mayonnaise  Simple steps for food safety  Shop smart. . Do not buy food stored or displayed in an unclean area. . Do not buy bruised or damaged fruits or vegetables. . Do not buy cans that have cracks, dents, or bulges. . Pick up foods that can spoil at the end of your  shopping trip and store them in a cooler on the way home.  Prepare and clean up foods carefully. . Rinse all fresh fruits and vegetables under running water, and dry them with a clean towel or paper towel. . Clean the top of cans before opening them. . After preparing food, wash your hands for 20 seconds with hot water and soap. Pay special attention to areas between fingers and under nails. . Clean your utensils and dishes with hot water and soap. Marland Kitchen Disinfect your kitchen and cutting boards using 1 teaspoon of liquid, unscented bleach mixed into 1 quart of water.    Dispose of old food. . Eat canned and packaged food before its expiration date (the "use by" or "best before" date). . Consume refrigerated leftovers within 3 to 4 days. After that time, throw out the food. Even if the food does not smell or look spoiled, it  still may be unsafe. Some bacteria, such as Listeria, can grow even on foods stored in the refrigerator if they are kept for too long.  Take precautions when eating out. . At restaurants, avoid buffets and salad bars where food sits out for a long time and comes in contact with many people. Food can become contaminated when someone with a virus, often a norovirus, or another "bug" handles it. . Put any leftover food in a "to-go" container yourself, rather than having the server do it. And, refrigerate leftovers as soon as you get home. . Choose restaurants that are clean and that are willing to prepare your food as you order it cooked.   AT HOME MEDICATIONS:                                                                                                                                                                Compazine/Prochlorperazine 10mg  tablet. Take 1 tablet every 6 hours as needed for nausea/vomiting. (This can make you sleepy)   EMLA cream. Apply a quarter size amount to port site 1 hour prior to chemo. Do not rub in. Cover with plastic wrap.    Diarrhea Sheet    If you are having loose stools/diarrhea, please purchase Imodium and begin taking as outlined:  At the first sign of poorly formed or loose stools you should begin taking Imodium (loperamide) 2 mg capsules.  Take two tablets (4mg ) followed by one tablet (2mg ) every 2 hours - DO NOT EXCEED 8 tablets in 24 hours.  If it is bedtime and you are having loose stools, take 2 tablets at bedtime, then 2 tablets every 4 hours until morning.   Always call the [facility] if you are having loose stools/diarrhea that you can't get under control.  Loose stools/diarrhea leads to dehydration (loss of water) in your body.  We have other options of trying to get the loose stools/diarrhea to stop but you must let us know!  Constipation Sheet  Colace - 100 mg capsules - take 2 capsules daily.  If this doesn't help then you can increase to 2 capsules twice daily.  Please call if the above does not work for you. Do not go more than 2 days without a bowel movement.  It is very important that you do not become constipated.  It will make you feel sick to your stomach (nausea) and can cause abdominal pain and vomiting.  Nausea Sheet   Compazine/Prochlorperazine 10mg  tablet. Take 1 tablet every 6 hours as needed for nausea/vomiting (This can make you drowsy).  If you are having persistent nausea (nausea that does not stop) please call the [facility] and let us know the amount of nausea that you are experiencing.  If you begin to vomit, you  need to call the [facility] and if it is the weekend and you have vomited more than one time and can't get it to stop-go to the Emergency Room.  Persistent nausea/vomiting can lead to dehydration (loss of fluid in your body) and will make you feel very weak and unwell. Ice chips, sips of clear liquids, foods that are at room temperature, crackers, and toast tend to be better tolerated.    SYMPTOMS TO REPORT AS SOON AS POSSIBLE AFTER TREATMENT:  FEVER GREATER THAN 100.5  F  CHILLS WITH OR WITHOUT FEVER  NAUSEA AND VOMITING THAT IS NOT CONTROLLED WITH YOUR NAUSEA MEDICATION  UNUSUAL SHORTNESS OF BREATH  UNUSUAL BRUISING OR BLEEDING  TENDERNESS IN MOUTH AND THROAT WITH OR WITHOUT   PRESENCE OF ULCERS  URINARY PROBLEMS  BOWEL PROBLEMS  UNUSUAL RASH    Wear comfortable clothing and clothing appropriate for easy access to any Portacath or PICC line. Let us know if there is anything that we can do to make your therapy better!   What to do if you need assistance after hours or on the weekends: CALL [phone].  HOLD on the line, do not hang up.  You will hear multiple messages but at the end you will be connected with a nurse triage line.  They will contact the doctor if necessary.  Most of the time they will be able to assist you.  Do not call the hospital operator.     I have been informed and understand all of the instructions given to me and have received a copy. I have been instructed to call the clinic [phone] or my family physician as soon as possible for continued medical care, if indicated. I do not have any more questions at this time but understand that I may call the [facility] or the Patient Navigator at [phone] during office hours should I have questions or need assistance in obtaining follow-up care.

## 2019-10-15 ENCOUNTER — Inpatient Hospital Stay (HOSPITAL_COMMUNITY): Payer: Medicaid Other

## 2019-10-15 ENCOUNTER — Other Ambulatory Visit: Payer: Self-pay

## 2019-10-15 ENCOUNTER — Inpatient Hospital Stay (HOSPITAL_COMMUNITY): Payer: Medicaid Other | Admitting: General Practice

## 2019-10-15 DIAGNOSIS — C8108 Nodular lymphocyte predominant Hodgkin lymphoma, lymph nodes of multiple sites: Secondary | ICD-10-CM

## 2019-10-15 NOTE — Progress Notes (Signed)
[facility] Initial Psychosocial Assessment Clinical Social Work  Clinical Social Work contacted by phone to assess psychosocial, emotional, mental health, and spiritual needs of the patient.   Barriers to care/review of distress screen:  - Transportation:  Do you anticipate any problems getting to appointments?  Do you have someone who can help run errands for you if you need it? Does not anticipate trouble getting to appointments, lives nearby.   - Help at home:  What is your living situation (alone, family, other)?  If you are physically unable to care for yourself, who would you call on to help you?  Lives w 44 yo son, mother, brother (for past two weeks) and mother of son.  Has plenty of people around to help.  Son is a "slow learner" and "doesn't really understand it."  Mother/grandmother was just treated for throat cancer.  Both have had throat cancer.   - Support system:  What does your support system look like?  Who would you call on if you needed some kind of practical help?  What if you needed someone to talk to for emotional support?  Has  - Finances:  Are you concerned about finances   Considering returning to work?  If not, applying for disability?  Did not pay electric bill this month, is not working at present. "Trying to wait til all this cancer stuff to get a job."  Has had Medicaid for "years."  Believes he has full Medicaid.  What is your understanding of where you are with your cancer? Its cause?  Your treatment plan and what happens next? Learned he had cancer after experiencing severe stomach pain - tests showed swollen lymph node.  Approx a month ago, biopsied sites and did scans which revealed cancer.  Aware that he will start chemotherapy soon, "I am not enthused about it - I don't know if it will make me weak or not."  Aware that mother was quite weak when she underwent chemotherapy - concerned he may not be able to do what he needs to do in order to take care of his son/mother/daily  needs.  He is the "only person in the house with a drivers license."  Will need to rely on the help of uncle or friends who may be able to assist.  Does get Food Stamps.    What are your worries for the future as you begin treatment for cancer?  Taking care of his son.  Being the only driver in the household.  Being tired.  "Kinda sorta know what to expect because my mom did chemo and radiation."    What are your hopes and priorities during your treatment? What is important to you? What are your goals for your care?  "My son is 60 and I'd like to see him grow up."  Has uncle that has come to stay with the family and is supportive to son.    What are you willing to sacrifice during your treatment?  Not sure what the impact/side effects of treatment will be.    What are you NOT willing to sacrifice during your treatment?  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  Lives w 3 yo son, son's mother, patient's mother.  Patient's brother has recently come to live w them in  order to support patient.  Worried about impact of chemo on his ability to drive/run errands for family and care for son.  No immediate financial concerns - gets Physicist, medical and is on full Medicaid (per patient).  Not working at present and plans to wait til after treatment to look for a job.    Identifications of barriers to care:  Possible need for additional support for son who is described by patient as a "slow learner" as both patient and son's grandmother have been diagnosed/treated for cancer recently.    Availability of community resources:  Educated about [facility] - will ask that he be given info on this resource during chemo ed visit.  Has missed a utility bill - mentioned possible help  from DSS under [facility] program.  Can find resources for son if needed.    Clinical Social Worker follow up needed: No.   Please reconsult if needed.    Edwyna Shell, LCSW Clinical Social Worker Phone:  [phone] Cell:  [phone]

## 2019-10-17 ENCOUNTER — Inpatient Hospital Stay (HOSPITAL_COMMUNITY): Payer: Medicaid Other

## 2019-10-17 NOTE — Progress Notes (Signed)
Chemotherapy and immunotherapy education packet given and discussed with pt and family in detail.  Discussed diagnosis and staging, tx regimen, and intent of tx.  Reviewed chemotherapy/immunotherapy medications and side effects, as well as pre-medications.  Instructed on how to manage side effects at home, and when to call the clinic.  Importance of fever/chills discussed with pt and family. Discussed precautions to implement at home after receiving tx, as well as self care strategies. Phone numbers provided for clinic during regular working hours, also how to reach the clinic after hours and on weekends. Pt and family provided the opportunity to ask questions - all questions answered to pt's and family satisfaction.

## 2019-10-22 ENCOUNTER — Other Ambulatory Visit: Payer: Self-pay

## 2019-10-22 ENCOUNTER — Inpatient Hospital Stay (HOSPITAL_COMMUNITY): Payer: Medicaid Other

## 2019-10-22 ENCOUNTER — Encounter (HOSPITAL_COMMUNITY): Payer: Self-pay | Admitting: Hematology

## 2019-10-22 ENCOUNTER — Inpatient Hospital Stay (HOSPITAL_BASED_OUTPATIENT_CLINIC_OR_DEPARTMENT_OTHER): Payer: Medicaid Other | Admitting: Hematology

## 2019-10-22 ENCOUNTER — Inpatient Hospital Stay (HOSPITAL_COMMUNITY): Payer: Medicaid Other | Attending: Hematology

## 2019-10-22 ENCOUNTER — Encounter (HOSPITAL_COMMUNITY): Payer: Self-pay

## 2019-10-22 VITALS — BP 125/81 | HR 75 | Temp 97.1°F | Ht 71.0 in | Wt 265.8 lb

## 2019-10-22 VITALS — BP 130/63 | HR 86 | Temp 97.6°F | Resp 18

## 2019-10-22 DIAGNOSIS — Z5111 Encounter for antineoplastic chemotherapy: Secondary | ICD-10-CM | POA: Diagnosis present

## 2019-10-22 DIAGNOSIS — Z5112 Encounter for antineoplastic immunotherapy: Secondary | ICD-10-CM | POA: Insufficient documentation

## 2019-10-22 DIAGNOSIS — Z5189 Encounter for other specified aftercare: Secondary | ICD-10-CM | POA: Insufficient documentation

## 2019-10-22 DIAGNOSIS — C8108 Nodular lymphocyte predominant Hodgkin lymphoma, lymph nodes of multiple sites: Secondary | ICD-10-CM | POA: Insufficient documentation

## 2019-10-22 DIAGNOSIS — D72828 Other elevated white blood cell count: Secondary | ICD-10-CM | POA: Diagnosis not present

## 2019-10-22 DIAGNOSIS — C8190 Hodgkin lymphoma, unspecified, unspecified site: Secondary | ICD-10-CM

## 2019-10-22 LAB — CBC WITH DIFFERENTIAL/PLATELET
Abs Immature Granulocytes: 0.04 10*3/uL (ref 0.00–0.07)
Basophils Absolute: 0.1 10*3/uL (ref 0.0–0.1)
Basophils Relative: 1 %
Eosinophils Absolute: 0.9 10*3/uL — ABNORMAL HIGH (ref 0.0–0.5)
Eosinophils Relative: 7 %
HCT: 45.8 % (ref 39.0–52.0)
Hemoglobin: 15.3 g/dL (ref 13.0–17.0)
Immature Granulocytes: 0 %
Lymphocytes Relative: 38 %
Lymphs Abs: 4.9 10*3/uL — ABNORMAL HIGH (ref 0.7–4.0)
MCH: 32.8 pg (ref 26.0–34.0)
MCHC: 33.4 g/dL (ref 30.0–36.0)
MCV: 98.3 fL (ref 80.0–100.0)
Monocytes Absolute: 1 10*3/uL (ref 0.1–1.0)
Monocytes Relative: 8 %
Neutro Abs: 5.9 10*3/uL (ref 1.7–7.7)
Neutrophils Relative %: 46 %
Platelets: 300 10*3/uL (ref 150–400)
RBC: 4.66 MIL/uL (ref 4.22–5.81)
RDW: 14.4 % (ref 11.5–15.5)
WBC: 12.9 10*3/uL — ABNORMAL HIGH (ref 4.0–10.5)
nRBC: 0 % (ref 0.0–0.2)

## 2019-10-22 LAB — COMPREHENSIVE METABOLIC PANEL
ALT: 44 U/L (ref 0–44)
AST: 30 U/L (ref 15–41)
Albumin: 3.8 g/dL (ref 3.5–5.0)
Alkaline Phosphatase: 97 U/L (ref 38–126)
Anion gap: 8 (ref 5–15)
BUN: 7 mg/dL (ref 6–20)
CO2: 24 mmol/L (ref 22–32)
Calcium: 8.8 mg/dL — ABNORMAL LOW (ref 8.9–10.3)
Chloride: 109 mmol/L (ref 98–111)
Creatinine, Ser: 0.75 mg/dL (ref 0.61–1.24)
GFR calc Af Amer: >60 mL/min (ref >60)
GFR calc non Af Amer: >60 mL/min (ref >60)
Glucose, Bld: 121 mg/dL — ABNORMAL HIGH (ref 70–99)
Potassium: 3.5 mmol/L (ref 3.5–5.1)
Sodium: 141 mmol/L (ref 135–145)
Total Bilirubin: 0.4 mg/dL (ref 0.3–1.2)
Total Protein: 6.5 g/dL (ref 6.5–8.1)

## 2019-10-22 LAB — MAGNESIUM: Magnesium: 2.2 mg/dL (ref 1.7–2.4)

## 2019-10-22 LAB — PHOSPHORUS: Phosphorus: 2.5 mg/dL (ref 2.5–4.6)

## 2019-10-22 LAB — URIC ACID: Uric Acid, Serum: 5.8 mg/dL (ref 3.7–8.6)

## 2019-10-22 MED ORDER — PALONOSETRON HCL INJECTION 0.25 MG/5ML: 0.2500 mg | Freq: Once | INTRAVENOUS | Status: DC

## 2019-10-22 MED ORDER — DIPHENHYDRAMINE HCL 50 MG/ML IJ SOLN
50.0000 mg | Freq: Once | INTRAMUSCULAR | Status: AC
  Administered 2019-10-22: 50 mg via INTRAVENOUS
  Filled 2019-10-22: qty 1

## 2019-10-22 MED ORDER — SODIUM CHLORIDE 0.9 % IV SOLN: 150.0000 mg | Freq: Once | INTRAVENOUS | Status: DC

## 2019-10-22 MED ORDER — SODIUM CHLORIDE 0.9 % IV SOLN
800.0000 mg | Freq: Once | INTRAVENOUS | Status: AC
  Administered 2019-10-22: 800 mg via INTRAVENOUS
  Filled 2019-10-22: qty 50

## 2019-10-22 MED ORDER — SODIUM CHLORIDE 0.9 % IV SOLN
100.0000 mg | Freq: Once | INTRAVENOUS | Status: AC
  Administered 2019-10-22: 100 mg via INTRAVENOUS
  Filled 2019-10-22: qty 10

## 2019-10-22 MED ORDER — SODIUM CHLORIDE 0.9% FLUSH
10.0000 mL | INTRAVENOUS | Status: DC | PRN
  Administered 2019-10-22 (×2): 10 mL

## 2019-10-22 MED ORDER — HEPARIN SOD (PORK) LOCK FLUSH 100 UNIT/ML IV SOLN
500.0000 [IU] | Freq: Once | INTRAVENOUS | Status: AC | PRN
  Administered 2019-10-22: 500 [IU]

## 2019-10-22 MED ORDER — ACETAMINOPHEN 325 MG PO TABS
650.0000 mg | ORAL_TABLET | Freq: Once | ORAL | Status: AC
  Administered 2019-10-22: 650 mg via ORAL
  Filled 2019-10-22: qty 2

## 2019-10-22 MED ORDER — SODIUM CHLORIDE 0.9 % IV SOLN
10.0000 mg | Freq: Once | INTRAVENOUS | Status: AC
  Administered 2019-10-22: 10 mg via INTRAVENOUS
  Filled 2019-10-22: qty 10

## 2019-10-22 MED ORDER — SODIUM CHLORIDE 0.9 % IV SOLN: Freq: Once | INTRAVENOUS | Status: AC

## 2019-10-22 MED ORDER — FAMOTIDINE IN NACL 20-0.9 MG/50ML-% IV SOLN
20.0000 mg | Freq: Once | INTRAVENOUS | Status: AC
  Administered 2019-10-22: 20 mg via INTRAVENOUS
  Filled 2019-10-22: qty 50

## 2019-10-22 MED ORDER — ALLOPURINOL 300 MG PO TABS: 300.0000 mg | ORAL_TABLET | Freq: Every day | ORAL | 0 refills | Status: DC

## 2019-10-22 NOTE — Progress Notes (Signed)
Nathan Waters, [place] [address]   CLINIC:  Medical Oncology/Hematology  PCP:  Nathan Rower, PA-C [address] [place] [address] [phone]   REASON FOR VISIT:  Follow-up for lymph node biopsy results.  CURRENT THERAPY: R-CHOP based chemotherapy.   INTERVAL HISTORY:  Nathan Waters 39 y.o. 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  He reports pain in the right thigh region.  Denies any fevers or night sweats.  Appetite and energy levels are 100%.    REVIEW OF SYSTEMS:  Review of Systems  Musculoskeletal:       Pain in the right thigh.  All other systems reviewed and are negative.    PAST MEDICAL/SURGICAL HISTORY:  Past Medical History:  Diagnosis Date  . Asthma   . Coronary atherosclerosis of native coronary artery    BMS to mid circumflex 11/2011, LVEF 55-60%  . Essential hypertension, benign   . Migraine   . Mixed hyperlipidemia   . STEMI (ST elevation myocardial infarction) ([place])    Complicated by VF arrest 11/2011   Past Surgical History:  Procedure Laterality Date  . AXILLARY LYMPH NODE BIOPSY Left 07/26/2019   Procedure: AXILLARY LYMPH NODE BIOPSY;  Surgeon: Nathan Signs, MD;  Location: [facility];  Service: General;  Laterality: Left;  . BACK SURGERY    . CARDIAC CATHETERIZATION    . CHOLECYSTECTOMY N/A 08/26/2019   Procedure: LAPAROSCOPIC CHOLECYSTECTOMY;  Surgeon: Nathan Signs, MD;  Location: [facility];  Service: General;  Laterality: N/A;  . LEFT HEART CATHETERIZATION WITH CORONARY ANGIOGRAM N/A 11/21/2011   Procedure: LEFT HEART CATHETERIZATION WITH CORONARY ANGIOGRAM;  Surgeon: Nathan M Martinique, MD;  Location: [facility] CATH LAB;  Service: Cardiovascular;  Laterality: N/A;  . LYMPH NODE BIOPSY Right 08/26/2019   Procedure: LYMPH NODE BIOPSY, INGUINAL;  Surgeon: Nathan Signs, MD;  Location: [facility];  Service: General;  Laterality: Right;  . PERCUTANEOUS CORONARY STENT INTERVENTION (PCI-S)  N/A 11/21/2011   Procedure: PERCUTANEOUS CORONARY STENT INTERVENTION (PCI-S);  Surgeon: Nathan M Martinique, MD;  Location: [facility] CATH LAB;  Service: Cardiovascular;  Laterality: N/A;  . PORTACATH PLACEMENT Left 10/01/2019   Procedure: INSERTION PORT-A-CATH (catheter attached left subclavian);  Surgeon: Nathan Signs, MD;  Location: [facility];  Service: General;  Laterality: Left;  . SPLENECTOMY       SOCIAL HISTORY:  Social History   Socioeconomic History  . Marital status: Divorced    Spouse name: Not on file  . Number of children: 2  . Years of education: Not on file  . Highest education level: Not on file  Occupational History  . Occupation: not employed  Tobacco Use  . Smoking status: Current Every Day Smoker    Packs/day: 1.00    Types: Cigarettes  . Smokeless tobacco: Never Used  Substance and Sexual Activity  . Alcohol use: Yes    Comment: Occasionally  . Drug use: No  . Sexual activity: Yes    Birth control/protection: Rhythm  Other Topics Concern  . Not on file  Social History Narrative  . Not on file   Social Determinants of Health   Financial Resource Strain: Low Risk   . Difficulty of Paying Living Expenses: Not hard at all  Food Insecurity: No Food Insecurity  . Worried About Charity fundraiser in the Last Year: Never true  . Ran Out of Food in the Last Year: Never true  Transportation Needs: No Transportation Needs  . Lack  of Transportation (Medical): No  . Lack of Transportation (Non-Medical): No  Physical Activity: Insufficiently Active  . Days of Exercise per Week: 3 days  . Minutes of Exercise per Session: 30 min  Stress: No Stress Concern Present  . Feeling of Stress : Only a little  Social Connections: Moderately Isolated  . Frequency of Communication with Friends and Family: More than three times a week  . Frequency of Social Gatherings with Friends and Family: Twice a week  . Attends Religious Services: Never  . Active Member of Clubs or Organizations:  No  . Attends Archivist Meetings: Not asked  . Marital Status: Divorced  Human resources officer Violence: Not At Risk  . Fear of Current or Ex-Partner: No  . Emotionally Abused: No  . Physically Abused: No  . Sexually Abused: No    FAMILY HISTORY:  Family History  Problem Relation Age of Onset  . Malignant hyperthermia Mother   . Cancer Mother   . Malignant hyperthermia Brother   . Heart disease Brother   . Asthma Brother   . COPD Brother   . Heart disease Father   . Cancer Father   . ADD / ADHD Daughter   . ADD / ADHD Son     CURRENT MEDICATIONS:  Outpatient Encounter Medications as of 10/22/2019  Medication Sig  . albuterol (PROVENTIL HFA;VENTOLIN HFA) 108 (90 Base) MCG/ACT inhaler Inhale 2 puffs into the lungs every 6 (six) hours as needed for wheezing or shortness of breath.   . allopurinol (ZYLOPRIM) 300 MG tablet Take 1 tablet (300 mg total) by mouth daily.  . cetirizine (ZYRTEC) 10 MG tablet Take 10 mg by mouth daily as needed for allergies.   . CYCLOPHOSPHAMIDE IV Inject into the vein every 21 ( twenty-one) days.  Marland Kitchen DOXORUBICIN HCL IV Inject into the vein every 21 ( twenty-one) days.  Marland Kitchen lidocaine-prilocaine (EMLA) cream Apply a small amount to port a cath site and cover with plastic wrap 1 hour prior to chemotherapy appointments  . nitroGLYCERIN (NITROSTAT) 0.4 MG SL tablet Place 1 tablet (0.4 mg total) under the tongue every 5 (five) minutes as needed for chest pain.  Marland Kitchen oxyCODONE-acetaminophen (PERCOCET) 7.5-325 MG tablet Take 1 tablet by mouth every 6 (six) hours as needed.  . predniSONE (DELTASONE) 20 MG tablet Take 100 mg (5 tablets) on days 1-5 of chemotherapy  . prochlorperazine (COMPAZINE) 10 MG tablet Take 1 tablet (10 mg total) by mouth every 6 (six) hours as needed for nausea or vomiting.  . riTUXimab in sodium chloride 0.9 % 250 mL Inject into the vein every 21 ( twenty-one) days.  . vinCRIStine 2 mg in sodium chloride 0.9 % 50 mL Inject 2 mg into the vein  every 21 ( twenty-one) days.   No facility-administered encounter medications on file as of 10/22/2019.    ALLERGIES:  Allergies  Allergen Reactions  . Penicillins Anaphylaxis    Did it involve swelling of the face/tongue/throat, SOB, or low BP? Yes Did it involve sudden or severe rash/hives, skin peeling, or any reaction on the inside of your mouth or nose? No Did you need to seek medical attention at a hospital or doctor's office? Yes When did it last happen?Childhood allergy If all above answers are "NO", may proceed with cephalosporin use.      PHYSICAL EXAM:  ECOG Performance status: 1  Vitals:   10/22/19 0824  BP: 125/81  Pulse: 75  Temp: (!) 97.1 F (36.2 C)  SpO2: 100%  Filed Weights   10/22/19 0824  Weight: 265 lb 12.8 oz (120.6 kg)    Physical Exam Vitals reviewed.  Constitutional:      Appearance: Normal appearance.  Cardiovascular:     Rate and Rhythm: Normal rate and regular rhythm.     Heart sounds: Normal heart sounds.  Pulmonary:     Effort: Pulmonary effort is normal.     Breath sounds: Normal breath sounds.  Abdominal:     General: There is no distension.     Palpations: Abdomen is soft. There is no mass.  Musculoskeletal:        General: No swelling.  Lymphadenopathy:     Cervical: No cervical adenopathy.  Skin:    General: Skin is warm.  Neurological:     Mental Status: He is alert and oriented to person, place, and time.  Psychiatric:        Mood and Affect: Mood normal.        Behavior: Behavior normal.    Bilateral leg swellings present.  Right inguinal area is tender.  LABORATORY DATA:  I have reviewed the labs as listed.  CBC    Component Value Date/Time   WBC 12.9 (H) 10/22/2019 0804   RBC 4.66 10/22/2019 0804   HGB 15.3 10/22/2019 0804   HCT 45.8 10/22/2019 0804   PLT 300 10/22/2019 0804   MCV 98.3 10/22/2019 0804   MCH 32.8 10/22/2019 0804   MCHC 33.4 10/22/2019 0804   RDW 14.4 10/22/2019 0804   LYMPHSABS 4.9  (H) 10/22/2019 0804   MONOABS 1.0 10/22/2019 0804   EOSABS 0.9 (H) 10/22/2019 0804   BASOSABS 0.1 10/22/2019 0804   CMP Latest Ref Rng & Units 10/22/2019 09/18/2019 08/15/2019  Glucose 70 - 99 mg/dL 121(H) 100(H) 133(H)  BUN 6 - 20 mg/dL 7 7 10   Creatinine 0.61 - 1.24 mg/dL 0.75 0.79 0.87  Sodium 135 - 145 mmol/L 141 140 137  Potassium 3.5 - 5.1 mmol/L 3.5 4.0 3.4(L)  Chloride 98 - 111 mmol/L 109 111 104  CO2 22 - 32 mmol/L 24 22 24   Calcium 8.9 - 10.3 mg/dL 8.8(L) 9.0 9.0  Total Protein 6.5 - 8.1 g/dL 6.5 7.1 7.1  Total Bilirubin 0.3 - 1.2 mg/dL 0.4 0.5 0.3  Alkaline Phos 38 - 126 U/L 97 112 124  AST 15 - 41 U/L 30 31 29   ALT 0 - 44 U/L 44 45(H) 43       DIAGNOSTIC IMAGING:  I have independently reviewed the scans and discussed with the patient.   ASSESSMENT & PLAN:   Hodgkin lymphoma ([place]) 1.  Nodular lymphocyte predominant Hodgkin's lymphoma: -CTAP on 06/15/2019 showed incidental bilateral external iliac adenopathy measuring up to 3.4 cm on the right side.  There is a 5 x 4 cm soft tissue mass in the superficial soft tissues of the anteromedial right upper thigh.  Multiple mildly enlarged upper abdominal lymph nodes measuring up to 13 mm in short axis in the gastrohepatic space.  Several scattered small nodular density in the upper omentum measure up to 7 mm in short axis. 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 Dema Severin count was elevated since 2011, predominantly neutrophils and monocytes. -LDH and beta-2 microglobulin was normal. -CT of the chest showed mediastinal adenopathy and small left axillary adenopathy. -Biopsy of the left axillary lymph node on 07/26/2019 was benign tissue. -PET scan on 08/12/2019 showed lymphadenopathy in the chest, abdomen and pelvis.  Dominant  nodes include a 4.2 cm short axis node in the right inferior inguinal region with the highest SUV and a 2.7 cm short axis node in the right paratracheal  region. -Right inguinal lymph node biopsy on 08/26/2019 shows nodular lymphocyte predominant Hodgkin's lymphoma.  Popcorn cells seen.  IHC reveals CD20 positive LP cells and disrupted follicular dendritic networks. -No B symptoms.  However he has pain in the right thigh. -Echocardiogram on 09/19/2019 shows EF 55-60%. -I have reviewed his labs including serology for hepatitis which were normal. -I have discussed with him in detail about chemotherapy regimen containing R-CHOP given for total of 6 cycles once every 3 weeks with Neulasta support.  We discussed various side effects including but not limited to alopecia, bone marrow suppression, rare chance of CHF, neuropathy, life-threatening infections, allergic reactions among others.  He understands and wants to proceed with therapy.  We will do scans after 2-3 cycles to evaluate response. -I have reviewed his labs from today.  They are adequate to proceed with cycle 1 treatment today.  We will give him rituximab on day 1 and chemotherapy on day 2. -She will be evaluated next week with tumor lysis labs.   2.  Tumor lysis prophylaxis: -We will start him on allopurinol 300 mg daily.  We will plan to repeat tumor lysis labs next week.  3.  Family history: -Father was diagnosed with metastatic cancer right before his death.  Mother had throat cancer.  Paternal grandfather had stomach cancer.  4.  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 -No evidence of infection at this time.   Total time spent is 40 minutes with more than 60% of the time spent face-to-face discussing diagnosis, prognosis, treatment plan, side effects, counseling and coordination of care. Orders placed this encounter:  Orders Placed This Encounter  Procedures  . CBC with Differential/Platelet  . Comprehensive metabolic panel  . Magnesium  . Phosphorus  . Uric acid      Derek Jack, MD [place] [phone]

## 2019-10-22 NOTE — Progress Notes (Signed)
10/22/19  Order clarified:  Give Rituximab today with 100 mg test dose then 800 mg remainder if dose passed.  Give CHOP tomorrow   T.O. Dr Alphonzo Severance Higinio Roger

## 2019-10-22 NOTE — Progress Notes (Signed)
START ON PATHWAY REGIMEN - Lymphoma and CLL     A cycle is every 21 days:     Prednisone      Rituximab-xxxx      Cyclophosphamide      Doxorubicin      Vincristine   **Always confirm dose/schedule in your pharmacy ordering system**  Patient Characteristics: Nodular Lymphocyte Predominant Hodgkin Lymphoma, First Line, Stage III / IV Disease Type: Not Applicable Disease Type: Not Applicable Disease Type: Nodular Lymphocyte Predominant Hodgkin Lymphoma Line of therapy: First Line [place] StageThompson Grayer Intent of Therapy: Curative Intent, Discussed with Patient

## 2019-10-22 NOTE — Patient Instructions (Signed)
[facility] Discharge Instructions for Patients Receiving Chemotherapy  Today you received the following chemotherapy agents   To help prevent nausea and vomiting after your treatment, we encourage you to take your nausea medication   If you develop nausea and vomiting that is not controlled by your nausea medication, call the clinic.   BELOW ARE SYMPTOMS THAT SHOULD BE REPORTED IMMEDIATELY:  *FEVER GREATER THAN 100.5 F  *CHILLS WITH OR WITHOUT FEVER  NAUSEA AND VOMITING THAT IS NOT CONTROLLED WITH YOUR NAUSEA MEDICATION  *UNUSUAL SHORTNESS OF BREATH  *UNUSUAL BRUISING OR BLEEDING  TENDERNESS IN MOUTH AND THROAT WITH OR WITHOUT PRESENCE OF ULCERS  *URINARY PROBLEMS  *BOWEL PROBLEMS  UNUSUAL RASH Items with * indicate a potential emergency and should be followed up as soon as possible.  Feel free to call the clinic should you have any questions or concerns. The clinic phone number is [phone].  Please show the CHEMO ALERT CARD at check-in to the Emergency Department and triage nurse.   

## 2019-10-22 NOTE — Assessment & Plan Note (Signed)
1.  Nodular lymphocyte predominant Hodgkin's lymphoma: -CTAP on 06/15/2019 showed incidental bilateral external iliac adenopathy measuring up to 3.4 cm on the right side.  There is a 5 x 4 cm soft tissue mass in the superficial soft tissues of the anteromedial right upper thigh.  Multiple mildly enlarged upper abdominal lymph nodes measuring up to 13 mm in short axis in the gastrohepatic space.  Several scattered small nodular density in the upper omentum measure up to 7 mm in short axis. 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 Dema Severin count was elevated since 2011, predominantly neutrophils and monocytes. -LDH and beta-2 microglobulin was normal. -CT of the chest showed mediastinal adenopathy and small left axillary adenopathy. -Biopsy of the left axillary lymph node on 07/26/2019 was benign tissue. -PET scan on 08/12/2019 showed lymphadenopathy in the chest, abdomen and pelvis.  Dominant nodes include a 4.2 cm short axis node in the right inferior inguinal region with the highest SUV and a 2.7 cm short axis node in the right paratracheal region. -Right inguinal lymph node biopsy on 08/26/2019 shows nodular lymphocyte predominant Hodgkin's lymphoma.  Popcorn cells seen.  IHC reveals CD20 positive LP cells and disrupted follicular dendritic networks. -No B symptoms.  However he has pain in the right thigh. -Echocardiogram on 09/19/2019 shows EF 55-60%. -I have reviewed his labs including serology for hepatitis which were normal. -I have discussed with him in detail about chemotherapy regimen containing R-CHOP given for total of 6 cycles once every 3 weeks with Neulasta support.  We discussed various side effects including but not limited to alopecia, bone marrow suppression, rare chance of CHF, neuropathy, life-threatening infections, allergic reactions among others.  He understands and wants to proceed with therapy.  We will do scans after 2-3 cycles to  evaluate response. -I have reviewed his labs from today.  They are adequate to proceed with cycle 1 treatment today.  We will give him rituximab on day 1 and chemotherapy on day 2. -She will be evaluated next week with tumor lysis labs.   2.  Tumor lysis prophylaxis: -We will start him on allopurinol 300 mg daily.  We will plan to repeat tumor lysis labs next week.  3.  Family history: -Father was diagnosed with metastatic cancer right before his death.  Mother had throat cancer.  Paternal grandfather had stomach cancer.  4.  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 -No evidence of infection at this time.

## 2019-10-22 NOTE — Patient Instructions (Addendum)
[place] at [facility] Discharge Instructions  You were seen today by Dr. Delton Coombes. He went over your recent lab results. You had your first treatment today. He will see you back in 1 week for labs and follow up.   Thank you for choosing [place] at [facility] to provide your oncology and hematology care.  To afford each patient quality time with our provider, please arrive at least 15 minutes before your scheduled appointment time.   If you have a lab appointment with the [facility] please come in thru the  Main Entrance and check in at the main information desk  You need to re-schedule your appointment should you arrive 10 or more minutes late.  We strive to give you quality time with our providers, and arriving late affects you and other patients whose appointments are after yours.  Also, if you no show three or more times for appointments you may be dismissed from the clinic at the providers discretion.     Again, thank you for choosing [facility].  Our hope is that these requests will decrease the amount of time that you wait before being seen by our physicians.       _____________________________________________________________  Should you have questions after your visit to [facility], please contact our office at [phone] between the hours of 8:00 a.m. and 4:30 p.m.  Voicemails left after 4:00 p.m. will not be returned until the following business day.  For prescription refill requests, have your pharmacy contact our office and allow 72 hours.    Cancer Center Support Programs:   > Cancer Support Group  2nd Tuesday of the month 1pm-2pm, Journey Room

## 2019-10-22 NOTE — Progress Notes (Signed)
Patient presents today for Day 1 Cycle 1 R-CHOP and office visit with Dr. Delton Coombes. Vital signs within parameters for treatment. Labs drawn from port and pending. Patient has no complaints of any changes since the last visit. Patient has pain that he rates a 7/10 at the location of the biopsies . Right leg and left axillary.   Treatment given today per MD orders. Tolerated infusion without adverse affects. Vital signs stable. No complaints at this time. Discharged from clinic ambulatory. F/U with [facility] as scheduled.

## 2019-10-23 ENCOUNTER — Inpatient Hospital Stay (HOSPITAL_COMMUNITY): Payer: Medicaid Other

## 2019-10-23 VITALS — BP 122/76 | HR 68 | Temp 97.3°F | Resp 18

## 2019-10-23 DIAGNOSIS — Z5112 Encounter for antineoplastic immunotherapy: Secondary | ICD-10-CM | POA: Diagnosis not present

## 2019-10-23 DIAGNOSIS — C8108 Nodular lymphocyte predominant Hodgkin lymphoma, lymph nodes of multiple sites: Secondary | ICD-10-CM

## 2019-10-23 MED ORDER — VINCRISTINE SULFATE CHEMO INJECTION 1 MG/ML
2.0000 mg | Freq: Once | INTRAVENOUS | Status: AC
  Administered 2019-10-23: 2 mg via INTRAVENOUS
  Filled 2019-10-23: qty 2

## 2019-10-23 MED ORDER — SODIUM CHLORIDE 0.9 % IV SOLN
10.0000 mg | Freq: Once | INTRAVENOUS | Status: AC
  Administered 2019-10-23: 10 mg via INTRAVENOUS
  Filled 2019-10-23: qty 10

## 2019-10-23 MED ORDER — SODIUM CHLORIDE 0.9 % IV SOLN
750.0000 mg/m2 | Freq: Once | INTRAVENOUS | Status: AC
  Administered 2019-10-23: 1840 mg via INTRAVENOUS
  Filled 2019-10-23: qty 92

## 2019-10-23 MED ORDER — HEPARIN SOD (PORK) LOCK FLUSH 100 UNIT/ML IV SOLN
500.0000 [IU] | Freq: Once | INTRAVENOUS | Status: AC | PRN
  Administered 2019-10-23: 500 [IU]

## 2019-10-23 MED ORDER — SODIUM CHLORIDE 0.9 % IV SOLN
150.0000 mg | Freq: Once | INTRAVENOUS | Status: AC
  Administered 2019-10-23: 150 mg via INTRAVENOUS
  Filled 2019-10-23: qty 150

## 2019-10-23 MED ORDER — SODIUM CHLORIDE 0.9% FLUSH
10.0000 mL | INTRAVENOUS | Status: DC | PRN
  Administered 2019-10-23: 10 mL

## 2019-10-23 MED ORDER — DOXORUBICIN HCL CHEMO IV INJECTION 2 MG/ML
50.0000 mg/m2 | Freq: Once | INTRAVENOUS | Status: AC
  Administered 2019-10-23: 124 mg via INTRAVENOUS
  Filled 2019-10-23: qty 62

## 2019-10-23 MED ORDER — PALONOSETRON HCL INJECTION 0.25 MG/5ML
0.2500 mg | Freq: Once | INTRAVENOUS | Status: AC
  Administered 2019-10-23: 0.25 mg via INTRAVENOUS
  Filled 2019-10-23: qty 5

## 2019-10-23 MED ORDER — SODIUM CHLORIDE 0.9 % IV SOLN: Freq: Once | INTRAVENOUS | Status: AC

## 2019-10-23 NOTE — Patient Instructions (Signed)
[facility] Discharge Instructions for Patients Receiving Chemotherapy  Today you received the following chemotherapy agents   To help prevent nausea and vomiting after your treatment, we encourage you to take your nausea medication   If you develop nausea and vomiting that is not controlled by your nausea medication, call the clinic.   BELOW ARE SYMPTOMS THAT SHOULD BE REPORTED IMMEDIATELY:  *FEVER GREATER THAN 100.5 F  *CHILLS WITH OR WITHOUT FEVER  NAUSEA AND VOMITING THAT IS NOT CONTROLLED WITH YOUR NAUSEA MEDICATION  *UNUSUAL SHORTNESS OF BREATH  *UNUSUAL BRUISING OR BLEEDING  TENDERNESS IN MOUTH AND THROAT WITH OR WITHOUT PRESENCE OF ULCERS  *URINARY PROBLEMS  *BOWEL PROBLEMS  UNUSUAL RASH Items with * indicate a potential emergency and should be followed up as soon as possible.  Feel free to call the clinic should you have any questions or concerns. The clinic phone number is [phone].  Please show the CHEMO ALERT CARD at check-in to the Emergency Department and triage nurse.   

## 2019-10-23 NOTE — Progress Notes (Signed)
Patient presents today for Cycle 1 Day 2. CHOP only today. MAR reviewed. Patient has no complaints of pain today. Vital signs within normal limits. Patient states at 04:00 this morning he woke up with slight nausea and heart burn. Patient states he took his medication and the nausea is resolved. Patient denies nausea and diarrhea.   Treatment given today per MD orders. Tolerated infusion without adverse affects. Vital signs stable. No complaints at this time. Discharged from clinic ambulatory. F/U with [facility] as scheduled.

## 2019-10-24 ENCOUNTER — Inpatient Hospital Stay (HOSPITAL_COMMUNITY): Payer: Medicaid Other

## 2019-10-24 ENCOUNTER — Encounter (HOSPITAL_COMMUNITY): Payer: Self-pay

## 2019-10-24 ENCOUNTER — Other Ambulatory Visit: Payer: Self-pay

## 2019-10-24 VITALS — BP 124/77 | HR 66 | Temp 97.1°F | Resp 18

## 2019-10-24 DIAGNOSIS — Z5112 Encounter for antineoplastic immunotherapy: Secondary | ICD-10-CM | POA: Diagnosis not present

## 2019-10-24 DIAGNOSIS — C8108 Nodular lymphocyte predominant Hodgkin lymphoma, lymph nodes of multiple sites: Secondary | ICD-10-CM

## 2019-10-24 MED ORDER — PEGFILGRASTIM-CBQV 6 MG/0.6ML ~~LOC~~ SOSY
6.0000 mg | PREFILLED_SYRINGE | Freq: Once | SUBCUTANEOUS | Status: AC
  Administered 2019-10-24: 6 mg via SUBCUTANEOUS
  Filled 2019-10-24: qty 0.6

## 2019-10-24 NOTE — Progress Notes (Signed)
Patient tolerated injection with no complaints voiced.  Site clean and dry with no bruising or swelling noted at site.  Band aid applied.  Vss with discharge and left ambulatory with no s/s of distress noted.  

## 2019-10-25 ENCOUNTER — Telehealth (HOSPITAL_COMMUNITY): Payer: Self-pay

## 2019-10-25 MED ORDER — PEGFILGRASTIM-JMDB 6 MG/0.6ML ~~LOC~~ SOSY
PREFILLED_SYRINGE | SUBCUTANEOUS | Status: AC
  Filled 2019-10-25: qty 0.6

## 2019-10-25 NOTE — Telephone Encounter (Signed)
Nutrition Assessment   Reason for Assessment:  Referral from Horris Latino, RN due to poor appetite   ASSESSMENT:  39 year old male with hodgkins lymphoma.  Past medical history of splenectomy, MIs in 20s, HTN, HLD.  Patient receiving chemotherapy.    Spoke with patient via phone.  Patient reports that his appetite has been decreased right before chemotherapy.  Reports that he ate half of 20 piece chicken nuggets yesterday after treatment.  Later in the evening ate hot pocket and salad.  Reports that he is getting ready to eat egg sandwich for lunch.  Reports that he has had one episode of nausea and was improved after taking medication.  Also reports an episode of heartburn that he thought was brought on by eating spicy foods.    Medications: compazine   Labs: reviewed   Anthropometrics:   Height: 71 inches Weight: 265 lb 12.8 oz on 1/5 273 lb on 11/5 BMI: 37  3% weight loss in the last 2 months, not significant   NUTRITION DIAGNOSIS: Inadequate oral intake related to cancer and cancer related treatment as evidenced by 3% weight loss, poor appetite, some nausea.   INTERVENTION:  Encouraged taking nausea medications. Discussed strategies to help with heartburn. Encouraged good sources of protein and provided examples of foods with protein.     MONITORING, EVALUATION, GOAL: patient will consume adequate calories and protein to maintain lean muscle mass during treatment   Next Visit: phone f/u Feb 5  Kasyn Stouffer B. Zenia Resides, [place], [place] Registered Dietitian [phone] (pager)

## 2019-10-25 NOTE — Progress Notes (Signed)
24 hour post chemotherapy follow up with injection.  Patient only stated fatigue and decreased appetite.  Reviewed side effects with treatment and nutrition.  Instructed the patient a dietitian consult would be entered for follow up.  No other complaints voiced.  No s/s of distress noted.

## 2019-10-31 ENCOUNTER — Encounter (HOSPITAL_COMMUNITY): Payer: Self-pay | Admitting: Hematology

## 2019-10-31 ENCOUNTER — Inpatient Hospital Stay (HOSPITAL_COMMUNITY): Payer: Medicaid Other

## 2019-10-31 ENCOUNTER — Other Ambulatory Visit: Payer: Self-pay

## 2019-10-31 ENCOUNTER — Inpatient Hospital Stay (HOSPITAL_BASED_OUTPATIENT_CLINIC_OR_DEPARTMENT_OTHER): Payer: Medicaid Other | Admitting: Hematology

## 2019-10-31 VITALS — BP 116/68 | HR 98 | Temp 97.1°F | Resp 18 | Wt 264.6 lb

## 2019-10-31 DIAGNOSIS — Z5112 Encounter for antineoplastic immunotherapy: Secondary | ICD-10-CM | POA: Diagnosis not present

## 2019-10-31 DIAGNOSIS — C8108 Nodular lymphocyte predominant Hodgkin lymphoma, lymph nodes of multiple sites: Secondary | ICD-10-CM

## 2019-10-31 LAB — COMPREHENSIVE METABOLIC PANEL
ALT: 61 U/L — ABNORMAL HIGH (ref 0–44)
AST: 33 U/L (ref 15–41)
Albumin: 3.7 g/dL (ref 3.5–5.0)
Alkaline Phosphatase: 117 U/L (ref 38–126)
Anion gap: 7 (ref 5–15)
BUN: 11 mg/dL (ref 6–20)
CO2: 24 mmol/L (ref 22–32)
Calcium: 9 mg/dL (ref 8.9–10.3)
Chloride: 108 mmol/L (ref 98–111)
Creatinine, Ser: 0.66 mg/dL (ref 0.61–1.24)
GFR calc Af Amer: >60 mL/min (ref >60)
GFR calc non Af Amer: >60 mL/min (ref >60)
Glucose, Bld: 120 mg/dL — ABNORMAL HIGH (ref 70–99)
Potassium: 3.7 mmol/L (ref 3.5–5.1)
Sodium: 139 mmol/L (ref 135–145)
Total Bilirubin: 0.4 mg/dL (ref 0.3–1.2)
Total Protein: 6.7 g/dL (ref 6.5–8.1)

## 2019-10-31 LAB — CBC WITH DIFFERENTIAL/PLATELET
Abs Immature Granulocytes: 0.01 10*3/uL (ref 0.00–0.07)
Basophils Absolute: 0.1 10*3/uL (ref 0.0–0.1)
Basophils Relative: 2 %
Eosinophils Absolute: 0.2 10*3/uL (ref 0.0–0.5)
Eosinophils Relative: 5 %
HCT: 43.4 % (ref 39.0–52.0)
Hemoglobin: 14.7 g/dL (ref 13.0–17.0)
Immature Granulocytes: 0 %
Lymphocytes Relative: 66 %
Lymphs Abs: 2.2 10*3/uL (ref 0.7–4.0)
MCH: 32.5 pg (ref 26.0–34.0)
MCHC: 33.9 g/dL (ref 30.0–36.0)
MCV: 96 fL (ref 80.0–100.0)
Monocytes Absolute: 0.3 10*3/uL (ref 0.1–1.0)
Monocytes Relative: 10 %
Neutro Abs: 0.6 10*3/uL — ABNORMAL LOW (ref 1.7–7.7)
Neutrophils Relative %: 17 %
Platelets: 132 10*3/uL — ABNORMAL LOW (ref 150–400)
RBC: 4.52 MIL/uL (ref 4.22–5.81)
RDW: 14 % (ref 11.5–15.5)
WBC: 3.3 10*3/uL — ABNORMAL LOW (ref 4.0–10.5)
nRBC: 0 % (ref 0.0–0.2)

## 2019-10-31 LAB — URIC ACID: Uric Acid, Serum: 2.9 mg/dL — ABNORMAL LOW (ref 3.7–8.6)

## 2019-10-31 LAB — PHOSPHORUS: Phosphorus: 3.1 mg/dL (ref 2.5–4.6)

## 2019-10-31 LAB — MAGNESIUM: Magnesium: 2.2 mg/dL (ref 1.7–2.4)

## 2019-10-31 MED ORDER — PREDNISONE 20 MG PO TABS: ORAL_TABLET | ORAL | 0 refills | Status: DC

## 2019-10-31 MED ORDER — NAPROXEN 500 MG PO TABS: 500.0000 mg | ORAL_TABLET | Freq: Two times a day (BID) | ORAL | 0 refills | Status: DC

## 2019-10-31 NOTE — Progress Notes (Signed)
[place], [place] [address]   CLINIC:  Medical Oncology/Hematology  PCP:  Sofie Rower, PA-C [address] [place] [address] [phone]   REASON FOR VISIT:  Follow-up for lymph node biopsy results.  CURRENT THERAPY: R-CHOP.   INTERVAL HISTORY:  Nathan Waters 39 y.o. male seen for follow-up 1 week after chemotherapy.  Denied any nausea, vomiting or diarrhea.  Reported body pains which lasted few days after the treatment.  Denies any fevers or chills.  Pains are mostly in the upper thigh region of both sides.    REVIEW OF SYSTEMS:  Review of Systems  Musculoskeletal:       Pain in the right thigh.  All other systems reviewed and are negative.    PAST MEDICAL/SURGICAL HISTORY:  Past Medical History:  Diagnosis Date  . Asthma   . Coronary atherosclerosis of native coronary artery    BMS to mid circumflex 11/2011, LVEF 55-60%  . Essential hypertension, benign   . Migraine   . Mixed hyperlipidemia   . STEMI (ST elevation myocardial infarction) ([place])    Complicated by VF arrest 11/2011   Past Surgical History:  Procedure Laterality Date  . AXILLARY LYMPH NODE BIOPSY Left 07/26/2019   Procedure: AXILLARY LYMPH NODE BIOPSY;  Surgeon: Aviva Signs, MD;  Location: [facility];  Service: General;  Laterality: Left;  . BACK SURGERY    . CARDIAC CATHETERIZATION    . CHOLECYSTECTOMY N/A 08/26/2019   Procedure: LAPAROSCOPIC CHOLECYSTECTOMY;  Surgeon: Aviva Signs, MD;  Location: [facility];  Service: General;  Laterality: N/A;  . LEFT HEART CATHETERIZATION WITH CORONARY ANGIOGRAM N/A 11/21/2011   Procedure: LEFT HEART CATHETERIZATION WITH CORONARY ANGIOGRAM;  Surgeon: Peter M Martinique, MD;  Location: [facility] CATH LAB;  Service: Cardiovascular;  Laterality: N/A;  . LYMPH NODE BIOPSY Right 08/26/2019   Procedure: LYMPH NODE BIOPSY, INGUINAL;  Surgeon: Aviva Signs, MD;  Location: [facility];  Service: General;  Laterality: Right;  . PERCUTANEOUS  CORONARY STENT INTERVENTION (PCI-S) N/A 11/21/2011   Procedure: PERCUTANEOUS CORONARY STENT INTERVENTION (PCI-S);  Surgeon: Peter M Martinique, MD;  Location: [facility] CATH LAB;  Service: Cardiovascular;  Laterality: N/A;  . PORTACATH PLACEMENT Left 10/01/2019   Procedure: INSERTION PORT-A-CATH (catheter attached left subclavian);  Surgeon: Aviva Signs, MD;  Location: [facility];  Service: General;  Laterality: Left;  . SPLENECTOMY       SOCIAL HISTORY:  Social History   Socioeconomic History  . Marital status: Divorced    Spouse name: Not on file  . Number of children: 2  . Years of education: Not on file  . Highest education level: Not on file  Occupational History  . Occupation: not employed  Tobacco Use  . Smoking status: Current Every Day Smoker    Packs/day: 1.00    Types: Cigarettes  . Smokeless tobacco: Never Used  Substance and Sexual Activity  . Alcohol use: Yes    Comment: Occasionally  . Drug use: No  . Sexual activity: Yes    Birth control/protection: Rhythm  Other Topics Concern  . Not on file  Social History Narrative  . Not on file   Social Determinants of Health   Financial Resource Strain: Low Risk   . Difficulty of Paying Living Expenses: Not hard at all  Food Insecurity: No Food Insecurity  . Worried About Charity fundraiser in the Last Year: Never true  . Ran Out of Food in the Last Year: Never true  Transportation Needs: No Transportation Needs  . Lack of Transportation (Medical): No  . Lack of Transportation (Non-Medical): No  Physical Activity: Insufficiently Active  . Days of Exercise per Week: 3 days  . Minutes of Exercise per Session: 30 min  Stress: No Stress Concern Present  . Feeling of Stress : Only a little  Social Connections: Moderately Isolated  . Frequency of Communication with Friends and Family: More than three times a week  . Frequency of Social Gatherings with Friends and Family: Twice a week  . Attends Religious Services: Never  .  Active Member of Clubs or Organizations: No  . Attends Archivist Meetings: Not asked  . Marital Status: Divorced  Human resources officer Violence: Not At Risk  . Fear of Current or Ex-Partner: No  . Emotionally Abused: No  . Physically Abused: No  . Sexually Abused: No    FAMILY HISTORY:  Family History  Problem Relation Age of Onset  . Malignant hyperthermia Mother   . Cancer Mother   . Malignant hyperthermia Brother   . Heart disease Brother   . Asthma Brother   . COPD Brother   . Heart disease Father   . Cancer Father   . ADD / ADHD Daughter   . ADD / ADHD Son     CURRENT MEDICATIONS:  Outpatient Encounter Medications as of 10/31/2019  Medication Sig  . allopurinol (ZYLOPRIM) 300 MG tablet Take 1 tablet (300 mg total) by mouth daily.  . cetirizine (ZYRTEC) 10 MG tablet Take 10 mg by mouth daily as needed for allergies.   . CYCLOPHOSPHAMIDE IV Inject into the vein every 21 ( twenty-one) days.  Marland Kitchen DOXORUBICIN HCL IV Inject into the vein every 21 ( twenty-one) days.  . riTUXimab in sodium chloride 0.9 % 250 mL Inject into the vein every 21 ( twenty-one) days.  . vinCRIStine 2 mg in sodium chloride 0.9 % 50 mL Inject 2 mg into the vein every 21 ( twenty-one) days.  . [DISCONTINUED] predniSONE (DELTASONE) 20 MG tablet Take 100 mg (5 tablets) on days 1-5 of chemotherapy  . albuterol (PROVENTIL HFA;VENTOLIN HFA) 108 (90 Base) MCG/ACT inhaler Inhale 2 puffs into the lungs every 6 (six) hours as needed for wheezing or shortness of breath.   . lidocaine-prilocaine (EMLA) cream Apply a small amount to port a cath site and cover with plastic wrap 1 hour prior to chemotherapy appointments (Patient not taking: Reported on 10/31/2019)  . naproxen (NAPROSYN) 500 MG tablet Take 1 tablet (500 mg total) by mouth 2 (two) times daily with a meal.  . nitroGLYCERIN (NITROSTAT) 0.4 MG SL tablet Place 1 tablet (0.4 mg total) under the tongue every 5 (five) minutes as needed for chest pain. (Patient  not taking: Reported on 10/31/2019)  . predniSONE (DELTASONE) 20 MG tablet Take 5 tablets daily for five days, with each cycle of Chemotherapy  . prochlorperazine (COMPAZINE) 10 MG tablet Take 1 tablet (10 mg total) by mouth every 6 (six) hours as needed for nausea or vomiting. (Patient not taking: Reported on 10/31/2019)  . [DISCONTINUED] oxyCODONE-acetaminophen (PERCOCET) 7.5-325 MG tablet Take 1 tablet by mouth every 6 (six) hours as needed. (Patient not taking: Reported on 10/31/2019)   No facility-administered encounter medications on file as of 10/31/2019.    ALLERGIES:  Allergies  Allergen Reactions  . Penicillins Anaphylaxis    Did it involve swelling of the face/tongue/throat, SOB, or low BP? Yes Did it involve sudden or severe rash/hives, skin peeling, or any reaction  on the inside of your mouth or nose? No Did you need to seek medical attention at a hospital or doctor's office? Yes When did it last happen?Childhood allergy If all above answers are "NO", may proceed with cephalosporin use.      PHYSICAL EXAM:  ECOG Performance status: 1  Vitals:   10/31/19 1103  BP: 116/68  Pulse: 98  Resp: 18  Temp: (!) 97.1 F (36.2 C)  SpO2: 96%   Filed Weights   10/31/19 1103  Weight: 264 lb 9.6 oz (120 kg)    Physical Exam Vitals reviewed.  Constitutional:      Appearance: Normal appearance.  Cardiovascular:     Rate and Rhythm: Normal rate and regular rhythm.     Heart sounds: Normal heart sounds.  Pulmonary:     Effort: Pulmonary effort is normal.     Breath sounds: Normal breath sounds.  Abdominal:     General: There is no distension.     Palpations: Abdomen is soft. There is no mass.  Musculoskeletal:        General: No swelling.  Lymphadenopathy:     Cervical: No cervical adenopathy.  Skin:    General: Skin is warm.  Neurological:     Mental Status: He is alert and oriented to person, place, and time.  Psychiatric:        Mood and Affect: Mood normal.         Behavior: Behavior normal.      LABORATORY DATA:  I have reviewed the labs as listed.  CBC    Component Value Date/Time   WBC 3.3 (L) 10/31/2019 1036   RBC 4.52 10/31/2019 1036   HGB 14.7 10/31/2019 1036   HCT 43.4 10/31/2019 1036   PLT 132 (L) 10/31/2019 1036   MCV 96.0 10/31/2019 1036   MCH 32.5 10/31/2019 1036   MCHC 33.9 10/31/2019 1036   RDW 14.0 10/31/2019 1036   LYMPHSABS 2.2 10/31/2019 1036   MONOABS 0.3 10/31/2019 1036   EOSABS 0.2 10/31/2019 1036   BASOSABS 0.1 10/31/2019 1036   CMP Latest Ref Rng & Units 10/31/2019 10/22/2019 09/18/2019  Glucose 70 - 99 mg/dL 120(H) 121(H) 100(H)  BUN 6 - 20 mg/dL 11 7 7   Creatinine 0.61 - 1.24 mg/dL 0.66 0.75 0.79  Sodium 135 - 145 mmol/L 139 141 140  Potassium 3.5 - 5.1 mmol/L 3.7 3.5 4.0  Chloride 98 - 111 mmol/L 108 109 111  CO2 22 - 32 mmol/L 24 24 22   Calcium 8.9 - 10.3 mg/dL 9.0 8.8(L) 9.0  Total Protein 6.5 - 8.1 g/dL 6.7 6.5 7.1  Total Bilirubin 0.3 - 1.2 mg/dL 0.4 0.4 0.5  Alkaline Phos 38 - 126 U/L 117 97 112  AST 15 - 41 U/L 33 30 31  ALT 0 - 44 U/L 61(H) 44 45(H)       DIAGNOSTIC IMAGING:  I have independently reviewed the scans and discussed with the patient.   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  Nodular lymphocyte predominant Hodgkin's lymphoma: -CTAP on 06/15/2019 showed incidental bilateral external iliac adenopathy measuring up to 3.4 cm on the right side.  There is a 5 x 4 cm soft tissue mass in the superficial soft tissues of the anteromedial right upper thigh.  Multiple mildly enlarged upper abdominal lymph nodes measuring up to 13 mm in short axis in the gastrohepatic space.  Several scattered small nodular density in the upper omentum  measure up to 7 mm in short axis. 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 Dema Severin count was elevated since 2011, predominantly neutrophils  and monocytes. -LDH and beta-2 microglobulin was normal. -CT of the chest showed mediastinal adenopathy and small left axillary adenopathy. -Biopsy of the left axillary lymph node on 07/26/2019 was benign tissue. -PET scan on 08/12/2019 showed lymphadenopathy in the chest, abdomen and pelvis.  Dominant nodes include a 4.2 cm short axis node in the right inferior inguinal region with the highest SUV and a 2.7 cm short axis node in the right paratracheal region. -Right inguinal lymph node biopsy on 08/26/2019 shows nodular lymphocyte predominant Hodgkin's lymphoma.  Popcorn cells seen.  IHC reveals CD20 positive LP cells and disrupted follicular dendritic networks. -No B symptoms.  However he has pain in the right thigh. -Echocardiogram on 09/19/2019 shows EF 55-60%. -Cycle 1 of R-CHOP on 10/23/2019. -He reported aches and pains in the legs lasted few days.  This is likely from Neulasta.  He had to use his friend's hydrocodone which did not help. -I reviewed his labs.  White count is 3.3 with ANC of 600.  Tumor lysis labs are within normal limits. -I will see him back in 2 weeks prior to start of cycle 2.   2.  Tumor lysis prophylaxis: -He will continue allopurinol 300 mg daily.  Uric acid is normal.  3.  Family history: -Father was diagnosed with metastatic cancer right before his death.  Mother had throat cancer.  Paternal grandfather had stomach cancer.  4.  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 -No evidence of infection at this time.    Orders placed this encounter:  No orders of the defined types were placed in this encounter.     Derek Jack, MD [place] [phone]

## 2019-10-31 NOTE — Patient Instructions (Addendum)
[place] at [facility] Discharge Instructions  You were seen today by Dr. Delton Coombes. He went over your recent lab results. He will send in prescriptions for Naprosyn and prednisone to your pharmacy. He will see you back in 2 weeks for labs, treatment and follow up.   Thank you for choosing [place] at [facility] to provide your oncology and hematology care.  To afford each patient quality time with our provider, please arrive at least 15 minutes before your scheduled appointment time.   If you have a lab appointment with the [facility] please come in thru the  Main Entrance and check in at the main information desk  You need to re-schedule your appointment should you arrive 10 or more minutes late.  We strive to give you quality time with our providers, and arriving late affects you and other patients whose appointments are after yours.  Also, if you no show three or more times for appointments you may be dismissed from the clinic at the providers discretion.     Again, thank you for choosing [facility].  Our hope is that these requests will decrease the amount of time that you wait before being seen by our physicians.       _____________________________________________________________  Should you have questions after your visit to [facility], please contact our office at [phone] between the hours of 8:00 a.m. and 4:30 p.m.  Voicemails left after 4:00 p.m. will not be returned until the following business day.  For prescription refill requests, have your pharmacy contact our office and allow 72 hours.    Cancer Center Support Programs:   > Cancer Support Group  2nd Tuesday of the month 1pm-2pm, Journey Room

## 2019-11-09 NOTE — Assessment & Plan Note (Signed)
1.  Nodular lymphocyte predominant Hodgkin's lymphoma: -CTAP on 06/15/2019 showed incidental bilateral external iliac adenopathy measuring up to 3.4 cm on the right side.  There is a 5 x 4 cm soft tissue mass in the superficial soft tissues of the anteromedial right upper thigh.  Multiple mildly enlarged upper abdominal lymph nodes measuring up to 13 mm in short axis in the gastrohepatic space.  Several scattered small nodular density in the upper omentum measure up to 7 mm in short axis. 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 Dema Severin count was elevated since 2011, predominantly neutrophils and monocytes. -LDH and beta-2 microglobulin was normal. -CT of the chest showed mediastinal adenopathy and small left axillary adenopathy. -Biopsy of the left axillary lymph node on 07/26/2019 was benign tissue. -PET scan on 08/12/2019 showed lymphadenopathy in the chest, abdomen and pelvis.  Dominant nodes include a 4.2 cm short axis node in the right inferior inguinal region with the highest SUV and a 2.7 cm short axis node in the right paratracheal region. -Right inguinal lymph node biopsy on 08/26/2019 shows nodular lymphocyte predominant Hodgkin's lymphoma.  Popcorn cells seen.  IHC reveals CD20 positive LP cells and disrupted follicular dendritic networks. -No B symptoms.  However he has pain in the right thigh. -Echocardiogram on 09/19/2019 shows EF 55-60%. -Cycle 1 of R-CHOP on 10/23/2019. -He reported aches and pains in the legs lasted few days.  This is likely from Neulasta.  He had to use his friend's hydrocodone which did not help. -I reviewed his labs.  White count is 3.3 with ANC of 600.  Tumor lysis labs are within normal limits. -I will see him back in 2 weeks prior to start of cycle 2.   2.  Tumor lysis prophylaxis: -He will continue allopurinol 300 mg daily.  Uric acid is normal.  3.  Family history: -Father was diagnosed with metastatic cancer  right before his death.  Mother had throat cancer.  Paternal grandfather had stomach cancer.  4.  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 -No evidence of infection at this time.

## 2019-11-12 ENCOUNTER — Inpatient Hospital Stay (HOSPITAL_COMMUNITY): Payer: Medicaid Other

## 2019-11-12 ENCOUNTER — Inpatient Hospital Stay (HOSPITAL_BASED_OUTPATIENT_CLINIC_OR_DEPARTMENT_OTHER): Payer: Medicaid Other | Admitting: Hematology

## 2019-11-12 ENCOUNTER — Encounter (HOSPITAL_COMMUNITY): Payer: Self-pay | Admitting: Hematology

## 2019-11-12 ENCOUNTER — Other Ambulatory Visit: Payer: Self-pay

## 2019-11-12 VITALS — BP 123/68 | HR 74 | Temp 97.1°F | Resp 18

## 2019-11-12 DIAGNOSIS — C8108 Nodular lymphocyte predominant Hodgkin lymphoma, lymph nodes of multiple sites: Secondary | ICD-10-CM

## 2019-11-12 DIAGNOSIS — C8190 Hodgkin lymphoma, unspecified, unspecified site: Secondary | ICD-10-CM

## 2019-11-12 DIAGNOSIS — Z5112 Encounter for antineoplastic immunotherapy: Secondary | ICD-10-CM | POA: Diagnosis not present

## 2019-11-12 LAB — CBC WITH DIFFERENTIAL/PLATELET
Abs Immature Granulocytes: 0.07 10*3/uL (ref 0.00–0.07)
Basophils Absolute: 0.1 10*3/uL (ref 0.0–0.1)
Basophils Relative: 1 %
Eosinophils Absolute: 0.2 10*3/uL (ref 0.0–0.5)
Eosinophils Relative: 1 %
HCT: 46.9 % (ref 39.0–52.0)
Hemoglobin: 15.6 g/dL (ref 13.0–17.0)
Immature Granulocytes: 1 %
Lymphocytes Relative: 22 %
Lymphs Abs: 3.1 10*3/uL (ref 0.7–4.0)
MCH: 32.4 pg (ref 26.0–34.0)
MCHC: 33.3 g/dL (ref 30.0–36.0)
MCV: 97.5 fL (ref 80.0–100.0)
Monocytes Absolute: 1.2 10*3/uL — ABNORMAL HIGH (ref 0.1–1.0)
Monocytes Relative: 9 %
Neutro Abs: 9.3 10*3/uL — ABNORMAL HIGH (ref 1.7–7.7)
Neutrophils Relative %: 66 %
Platelets: 473 10*3/uL — ABNORMAL HIGH (ref 150–400)
RBC: 4.81 MIL/uL (ref 4.22–5.81)
RDW: 14.8 % (ref 11.5–15.5)
WBC: 13.9 10*3/uL — ABNORMAL HIGH (ref 4.0–10.5)
nRBC: 0 % (ref 0.0–0.2)

## 2019-11-12 LAB — MAGNESIUM: Magnesium: 2.1 mg/dL (ref 1.7–2.4)

## 2019-11-12 LAB — COMPREHENSIVE METABOLIC PANEL
ALT: 61 U/L — ABNORMAL HIGH (ref 0–44)
AST: 33 U/L (ref 15–41)
Albumin: 4 g/dL (ref 3.5–5.0)
Alkaline Phosphatase: 120 U/L (ref 38–126)
Anion gap: 7 (ref 5–15)
BUN: 7 mg/dL (ref 6–20)
CO2: 24 mmol/L (ref 22–32)
Calcium: 9.2 mg/dL (ref 8.9–10.3)
Chloride: 108 mmol/L (ref 98–111)
Creatinine, Ser: 0.77 mg/dL (ref 0.61–1.24)
GFR calc Af Amer: >60 mL/min (ref >60)
GFR calc non Af Amer: >60 mL/min (ref >60)
Glucose, Bld: 121 mg/dL — ABNORMAL HIGH (ref 70–99)
Potassium: 3.6 mmol/L (ref 3.5–5.1)
Sodium: 139 mmol/L (ref 135–145)
Total Bilirubin: 0.4 mg/dL (ref 0.3–1.2)
Total Protein: 7 g/dL (ref 6.5–8.1)

## 2019-11-12 LAB — PHOSPHORUS: Phosphorus: 1.9 mg/dL — ABNORMAL LOW (ref 2.5–4.6)

## 2019-11-12 LAB — URIC ACID: Uric Acid, Serum: 4 mg/dL (ref 3.7–8.6)

## 2019-11-12 MED ORDER — SODIUM CHLORIDE 0.9 % IV SOLN
750.0000 mg/m2 | Freq: Once | INTRAVENOUS | Status: AC
  Administered 2019-11-12: 1840 mg via INTRAVENOUS
  Filled 2019-11-12: qty 92

## 2019-11-12 MED ORDER — DIPHENHYDRAMINE HCL 50 MG/ML IJ SOLN
50.0000 mg | Freq: Once | INTRAMUSCULAR | Status: AC
  Administered 2019-11-12: 50 mg via INTRAVENOUS
  Filled 2019-11-12: qty 1

## 2019-11-12 MED ORDER — PHOSPHA 250 NEUTRAL 155-852-130 MG PO TABS: 1.0000 | ORAL_TABLET | Freq: Two times a day (BID) | ORAL | 0 refills | Status: DC

## 2019-11-12 MED ORDER — VINCRISTINE SULFATE CHEMO INJECTION 1 MG/ML
2.0000 mg | Freq: Once | INTRAVENOUS | Status: AC
  Administered 2019-11-12: 2 mg via INTRAVENOUS
  Filled 2019-11-12: qty 2

## 2019-11-12 MED ORDER — SODIUM CHLORIDE 0.9% FLUSH
10.0000 mL | INTRAVENOUS | Status: DC | PRN
  Administered 2019-11-12: 10 mL

## 2019-11-12 MED ORDER — FAMOTIDINE IN NACL 20-0.9 MG/50ML-% IV SOLN
20.0000 mg | Freq: Once | INTRAVENOUS | Status: AC
  Administered 2019-11-12: 20 mg via INTRAVENOUS
  Filled 2019-11-12: qty 50

## 2019-11-12 MED ORDER — SODIUM CHLORIDE 0.9 % IV SOLN
150.0000 mg | Freq: Once | INTRAVENOUS | Status: AC
  Administered 2019-11-12: 150 mg via INTRAVENOUS
  Filled 2019-11-12: qty 150

## 2019-11-12 MED ORDER — SODIUM CHLORIDE 0.9 % IV SOLN
10.0000 mg | Freq: Once | INTRAVENOUS | Status: AC
  Administered 2019-11-12: 10 mg via INTRAVENOUS
  Filled 2019-11-12: qty 10

## 2019-11-12 MED ORDER — DOXORUBICIN HCL CHEMO IV INJECTION 2 MG/ML
50.0000 mg/m2 | Freq: Once | INTRAVENOUS | Status: AC
  Administered 2019-11-12: 124 mg via INTRAVENOUS
  Filled 2019-11-12: qty 62

## 2019-11-12 MED ORDER — SODIUM CHLORIDE 0.9 % IV SOLN
375.0000 mg/m2 | Freq: Once | INTRAVENOUS | Status: AC
  Administered 2019-11-12: 900 mg via INTRAVENOUS
  Filled 2019-11-12: qty 50

## 2019-11-12 MED ORDER — PALONOSETRON HCL INJECTION 0.25 MG/5ML
0.2500 mg | Freq: Once | INTRAVENOUS | Status: AC
  Administered 2019-11-12: 0.25 mg via INTRAVENOUS
  Filled 2019-11-12: qty 5

## 2019-11-12 MED ORDER — SODIUM CHLORIDE 0.9 % IV SOLN: Freq: Once | INTRAVENOUS | Status: AC

## 2019-11-12 MED ORDER — HEPARIN SOD (PORK) LOCK FLUSH 100 UNIT/ML IV SOLN
500.0000 [IU] | Freq: Once | INTRAVENOUS | Status: AC | PRN
  Administered 2019-11-12: 500 [IU]

## 2019-11-12 MED ORDER — ACETAMINOPHEN 325 MG PO TABS
650.0000 mg | ORAL_TABLET | Freq: Once | ORAL | Status: AC
  Administered 2019-11-12: 650 mg via ORAL
  Filled 2019-11-12: qty 2

## 2019-11-12 NOTE — Progress Notes (Signed)
Patient has been assessed, vital signs and labs have been reviewed by Dr. Delton Coombes. ANC, Creatinine, LFTs, and Platelets are within treatment parameters per Dr. Delton Coombes. Give Rituximab and Chemo today.The patient is good to proceed with treatment at this time.

## 2019-11-12 NOTE — Patient Instructions (Signed)
[facility] at [facility] Discharge Instructions  You were seen today by Dr. Katragadda. He went over your recent lab results. He will see you back in 3 weeks for labs, treatment and follow up.   Thank you for choosing [facility] at [facility] to provide your oncology and hematology care.  To afford each patient quality time with our provider, please arrive at least 15 minutes before your scheduled appointment time.   If you have a lab appointment with the Cancer Center please come in thru the  Main Entrance and check in at the main information desk  You need to re-schedule your appointment should you arrive 10 or more minutes late.  We strive to give you quality time with our providers, and arriving late affects you and other patients whose appointments are after yours.  Also, if you no show three or more times for appointments you may be dismissed from the clinic at the providers discretion.     Again, thank you for choosing [facility].  Our hope is that these requests will decrease the amount of time that you wait before being seen by our physicians.       _____________________________________________________________  Should you have questions after your visit to  Cancer Center, please contact our office at [phone] between the hours of 8:00 a.m. and 4:30 p.m.  Voicemails left after 4:00 p.m. will not be returned until the following business day.  For prescription refill requests, have your pharmacy contact our office and allow 72 hours.    Cancer Center Support Programs:   > Cancer Support Group  2nd Tuesday of the month 1pm-2pm, Journey Room    

## 2019-11-12 NOTE — Patient Instructions (Signed)
[facility] at [facility] Discharge Instructions  Labs drawn from portacath today   Thank you for choosing [facility] at [facility] to provide your oncology and hematology care.  To afford each patient quality time with our provider, please arrive at least 15 minutes before your scheduled appointment time.   If you have a lab appointment with the Cancer Center please come in thru the Main Entrance and check in at the main information desk.  You need to re-schedule your appointment should you arrive 10 or more minutes late.  We strive to give you quality time with our providers, and arriving late affects you and other patients whose appointments are after yours.  Also, if you no show three or more times for appointments you may be dismissed from the clinic at the providers discretion.     Again, thank you for choosing [facility].  Our hope is that these requests will decrease the amount of time that you wait before being seen by our physicians.       _____________________________________________________________  Should you have questions after your visit to [facility], please contact our office at [phone] between the hours of 8:00 a.m. and 4:30 p.m.  Voicemails left after 4:00 p.m. will not be returned until the following business day.  For prescription refill requests, have your pharmacy contact our office and allow 72 hours.    Due to Covid, you will need to wear a mask upon entering the hospital. If you do not have a mask, a mask will be given to you at the Main Entrance upon arrival. For doctor visits, patients may have 1 support person with them. For treatment visits, patients can not have anyone with them due to social distancing guidelines and our immunocompromised population.     

## 2019-11-12 NOTE — Assessment & Plan Note (Signed)
1.  Nodular lymphocyte predominant Hodgkin's lymphoma: -CTAP on 06/15/2019 showed incidental bilateral external iliac adenopathy measuring up to 3.4 cm on the right side.  There is a 5 x 4 cm soft tissue mass in the superficial soft tissues of the anteromedial right upper thigh.  Multiple mildly enlarged upper abdominal lymph nodes measuring up to 13 mm in short axis in the gastrohepatic space.  Several scattered small nodular density in the upper omentum measure up to 7 mm in short axis. 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 Dema Severin count was elevated since 2011, predominantly neutrophils and monocytes. -LDH and beta-2 microglobulin was normal. -CT of the chest showed mediastinal adenopathy and small left axillary adenopathy. -Biopsy of the left axillary lymph node on 07/26/2019 was benign tissue. -PET scan on 08/12/2019 showed lymphadenopathy in the chest, abdomen and pelvis.  Dominant nodes include a 4.2 cm short axis node in the right inferior inguinal region with the highest SUV and a 2.7 cm short axis node in the right paratracheal region. -Right inguinal lymph node biopsy on 08/26/2019 shows nodular lymphocyte predominant Hodgkin's lymphoma.  Popcorn cells seen.  IHC reveals CD20 positive LP cells and disrupted follicular dendritic networks. -No B symptoms.  However he has pain in the right thigh. -Echocardiogram on 09/19/2019 shows EF 55-60%. -Cycle 1 of R-CHOP on 10/23/2019.  He experienced pains in both eyes started about a week after his treatment.  He is completely pain-free at this time. -I have reviewed his labs today.  He is okay to proceed with cycle 2 without any dose modifications.  He will receive rituximab and chemo on the same day today. -We will see him back in 3 weeks for follow-up.   2.  Tumor lysis prophylaxis: -He will continue allopurinol 300 mg daily.  Uric acid today is normal.  3.  Hypophosphatemia: -His phosphate level is  1.9 (2.5-4.6.).  We will start him on Neutra-Phos 1 tablet twice daily.  4.  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 -No evidence of infection at this time.

## 2019-11-12 NOTE — Patient Instructions (Signed)
[facility] Discharge Instructions for Patients Receiving Chemotherapy   Beginning January 23rd 2017 lab work for the [facility] will be done in the  Main lab at [facility] on 1st floor. If you have a lab appointment with the [place] please come in thru the  Main Entrance and check in at the main information desk   Today you received the following chemotherapy agents Rituxan,Adraimycin,Cytoxan and Vincristine. Follow-up as scheduled. Call clinic for any questions or concerns  To help prevent nausea and vomiting after your treatment, we encourage you to take your nausea medication   If you develop nausea and vomiting, or diarrhea that is not controlled by your medication, call the clinic.  The clinic phone number is [phone]. Office hours are Monday-Friday 8:30am-5:00pm.  BELOW ARE SYMPTOMS THAT SHOULD BE REPORTED IMMEDIATELY:  *FEVER GREATER THAN 101.0 F  *CHILLS WITH OR WITHOUT FEVER  NAUSEA AND VOMITING THAT IS NOT CONTROLLED WITH YOUR NAUSEA MEDICATION  *UNUSUAL SHORTNESS OF BREATH  *UNUSUAL BRUISING OR BLEEDING  TENDERNESS IN MOUTH AND THROAT WITH OR WITHOUT PRESENCE OF ULCERS  *URINARY PROBLEMS  *BOWEL PROBLEMS  UNUSUAL RASH Items with * indicate a potential emergency and should be followed up as soon as possible. If you have an emergency after office hours please contact your primary care physician or go to the nearest emergency department.  Please call the clinic during office hours if you have any questions or concerns.   You may also contact the Patient Navigator at [phone] should you have any questions or need assistance in obtaining follow up care.      Resources For Cancer Patients and their Caregivers ? American Cancer Society: Can assist with transportation, wigs, general needs, runs Look Good Feel Better.        ([phone] ? Cancer Care: Provides financial assistance, online support groups, medication/co-pay  assistance.  1-800-813-HOPE 212-184-1687) ? [place] Assists [place] cancer patients and their families through emotional , educational and financial support.  336 886 4188 ? [place] DSS Where to apply for food stamps, Medicaid and utility assistance. (541)005-5430 ? [facility]: Transportation to medical appointments. 206-026-7565 ? Social Security Administration: May apply for disability if have a Stage IV cancer. 413 748 0367 (260)132-0517 ? [employer], Disability and Transit Services: Assists with nutrition, care and transit needs. 847-300-1847

## 2019-11-12 NOTE — Progress Notes (Signed)
0930 Labs reviewed with and pt seen by Dr. Delton Coombes and pt approved for RCHOP today per MD                                                Nathan Waters tolerated chemo tx well without complaints or incident. VSS upon discharge. Pt discharged self ambulatory in satisfactory condition

## 2019-11-12 NOTE — Progress Notes (Signed)
Nathan Waters, Nathan Waters   CLINIC:  Medical Oncology/Hematology  PCP:  Nathan Waters, Nathan Waters [address] [place] [address] [phone]   REASON FOR VISIT:  Follow-up for lymph node biopsy results.  CURRENT THERAPY: R-CHOP.   INTERVAL HISTORY:  Nathan Waters 39 y.o. male seen for follow-up and toxicity assessment prior to cycle 2 of chemotherapy.  Appetite and energy levels are 75%.  Leg swelling has been stable.  He reported fatigue after first week of treatment.  He reported pains in the both eyes during the second week.  He does not have any pains at this time.   REVIEW OF SYSTEMS:  Review of Systems  All other systems reviewed and are negative.    PAST MEDICAL/SURGICAL HISTORY:  Past Medical History:  Diagnosis Date  . Asthma   . Coronary atherosclerosis of native coronary artery    BMS to mid circumflex 11/2011, LVEF 55-60%  . Essential hypertension, benign   . Migraine   . Mixed hyperlipidemia   . STEMI (ST elevation myocardial infarction) ([place])    Complicated by VF arrest 11/2011   Past Surgical History:  Procedure Laterality Date  . AXILLARY LYMPH NODE BIOPSY Left 07/26/2019   Procedure: AXILLARY LYMPH NODE BIOPSY;  Surgeon: Nathan Signs, MD;  Location: [facility];  Service: General;  Laterality: Left;  . BACK SURGERY    . CARDIAC CATHETERIZATION    . CHOLECYSTECTOMY N/A 08/26/2019   Procedure: LAPAROSCOPIC CHOLECYSTECTOMY;  Surgeon: Nathan Signs, MD;  Location: [facility];  Service: General;  Laterality: N/A;  . LEFT HEART CATHETERIZATION WITH CORONARY ANGIOGRAM N/A 11/21/2011   Procedure: LEFT HEART CATHETERIZATION WITH CORONARY ANGIOGRAM;  Surgeon: Nathan M Martinique, MD;  Location: [facility] CATH LAB;  Service: Cardiovascular;  Laterality: N/A;  . LYMPH NODE BIOPSY Right 08/26/2019   Procedure: LYMPH NODE BIOPSY, INGUINAL;  Surgeon: Nathan Signs, MD;  Location: [facility];  Service: General;  Laterality: Right;  . PERCUTANEOUS  CORONARY STENT INTERVENTION (PCI-S) N/A 11/21/2011   Procedure: PERCUTANEOUS CORONARY STENT INTERVENTION (PCI-S);  Surgeon: Nathan M Martinique, MD;  Location: [facility] CATH LAB;  Service: Cardiovascular;  Laterality: N/A;  . PORTACATH PLACEMENT Left 10/01/2019   Procedure: INSERTION PORT-A-CATH (catheter attached left subclavian);  Surgeon: Nathan Signs, MD;  Location: [facility];  Service: General;  Laterality: Left;  . SPLENECTOMY       SOCIAL HISTORY:  Social History   Socioeconomic History  . Marital status: Divorced    Spouse name: Not on file  . Number of children: 2  . Years of education: Not on file  . Highest education level: Not on file  Occupational History  . Occupation: not employed  Tobacco Use  . Smoking status: Current Every Day Smoker    Packs/day: 1.00    Types: Cigarettes  . Smokeless tobacco: Never Used  Substance and Sexual Activity  . Alcohol use: Yes    Comment: Occasionally  . Drug use: No  . Sexual activity: Yes    Birth control/protection: Rhythm  Other Topics Concern  . Not on file  Social History Narrative  . Not on file   Social Determinants of Health   Financial Resource Strain: Low Risk   . Difficulty of Paying Living Expenses: Not hard at all  Food Insecurity: No Food Insecurity  . Worried About Charity fundraiser in the Last Year: Never true  . Ran Out of Food in the Last Year: Never true  Transportation Needs: No Transportation Needs  . Lack of Transportation (Medical): No  . Lack of Transportation (Non-Medical): No  Physical Activity: Insufficiently Active  . Days of Exercise per Week: 3 days  . Minutes of Exercise per Session: 30 min  Stress: No Stress Concern Present  . Feeling of Stress : Only a little  Social Connections: Moderately Isolated  . Frequency of Communication with Friends and Family: More than three times a week  . Frequency of Social Gatherings with Friends and Family: Twice a week  . Attends Religious Services: Never  .  Active Member of Clubs or Organizations: No  . Attends Archivist Meetings: Not asked  . Marital Status: Divorced  Human resources officer Violence: Not At Risk  . Fear of Current or Ex-Partner: No  . Emotionally Abused: No  . Physically Abused: No  . Sexually Abused: No    FAMILY HISTORY:  Family History  Problem Relation Age of Onset  . Malignant hyperthermia Mother   . Cancer Mother   . Malignant hyperthermia Brother   . Heart disease Brother   . Asthma Brother   . COPD Brother   . Heart disease Father   . Cancer Father   . ADD / ADHD Daughter   . ADD / ADHD Son     CURRENT MEDICATIONS:  Outpatient Encounter Medications as of 11/12/2019  Medication Sig  . allopurinol (ZYLOPRIM) 300 MG tablet Take 1 tablet (300 mg total) by mouth daily.  . cetirizine (ZYRTEC) 10 MG tablet Take 10 mg by mouth daily as needed for allergies.   . CYCLOPHOSPHAMIDE IV Inject into the vein every 21 ( twenty-one) days.  Marland Kitchen DOXORUBICIN HCL IV Inject into the vein every 21 ( twenty-one) days.  . predniSONE (DELTASONE) 20 MG tablet Take 5 tablets daily for five days, with each cycle of Chemotherapy  . riTUXimab in sodium chloride 0.9 % 250 mL Inject into the vein every 21 ( twenty-one) days.  . vinCRIStine 2 mg in sodium chloride 0.9 % 50 mL Inject 2 mg into the vein every 21 ( twenty-one) days.  Marland Kitchen albuterol (PROVENTIL HFA;VENTOLIN HFA) 108 (90 Base) MCG/ACT inhaler Inhale 2 puffs into the lungs every 6 (six) hours as needed for wheezing or shortness of breath.   . K Phos Mono-Sod Phos Di & Mono (PHOSPHA 250 NEUTRAL) 155-852-130 MG TABS Take 1 tablet by mouth 2 (two) times daily.  Marland Kitchen lidocaine-prilocaine (EMLA) cream Apply a small amount to port a cath site and cover with plastic wrap 1 hour prior to chemotherapy appointments (Patient not taking: Reported on 10/31/2019)  . naproxen (NAPROSYN) 500 MG tablet Take 1 tablet (500 mg total) by mouth 2 (two) times daily with a meal. (Patient not taking: Reported  on 11/12/2019)  . nitroGLYCERIN (NITROSTAT) 0.4 MG SL tablet Place 1 tablet (0.4 mg total) under the tongue every 5 (five) minutes as needed for chest pain. (Patient not taking: Reported on 10/31/2019)  . prochlorperazine (COMPAZINE) 10 MG tablet Take 1 tablet (10 mg total) by mouth every 6 (six) hours as needed for nausea or vomiting. (Patient not taking: Reported on 10/31/2019)   No facility-administered encounter medications on file as of 11/12/2019.    ALLERGIES:  Allergies  Allergen Reactions  . Penicillins Anaphylaxis    Did it involve swelling of the face/tongue/throat, SOB, or low BP? Yes Did it involve sudden or severe rash/hives, skin peeling, or any reaction on the inside of your mouth or nose? No Did you need to  seek medical attention at a hospital or doctor's office? Yes When did it last happen?Childhood allergy If all above answers are "NO", may proceed with cephalosporin use.      PHYSICAL EXAM:  ECOG Performance status: 1  Vitals:   11/12/19 0821  BP: 128/84  Pulse: (!) 113  Resp: 18  Temp: (!) 97.5 F (36.4 C)  SpO2: 96%   Filed Weights   11/12/19 0821  Weight: 263 lb 3.2 oz (119.4 kg)    Physical Exam Vitals reviewed.  Constitutional:      Appearance: Normal appearance.  Cardiovascular:     Rate and Rhythm: Normal rate and regular rhythm.     Heart sounds: Normal heart sounds.  Pulmonary:     Effort: Pulmonary effort is normal.     Breath sounds: Normal breath sounds.  Abdominal:     General: There is no distension.     Palpations: Abdomen is soft. There is no mass.  Musculoskeletal:        General: No swelling.  Lymphadenopathy:     Cervical: No cervical adenopathy.  Skin:    General: Skin is warm.  Neurological:     Mental Status: He is alert and oriented to person, place, and time.  Psychiatric:        Mood and Affect: Mood normal.        Behavior: Behavior normal.      LABORATORY DATA:  I have reviewed the labs as listed.  CBC     Component Value Date/Time   WBC 13.9 (H) 11/12/2019 0823   RBC 4.81 11/12/2019 0823   HGB 15.6 11/12/2019 0823   HCT 46.9 11/12/2019 0823   PLT 473 (H) 11/12/2019 0823   MCV 97.5 11/12/2019 0823   MCH 32.4 11/12/2019 0823   MCHC 33.3 11/12/2019 0823   RDW 14.8 11/12/2019 0823   LYMPHSABS 3.1 11/12/2019 0823   MONOABS 1.2 (H) 11/12/2019 0823   EOSABS 0.2 11/12/2019 0823   BASOSABS 0.1 11/12/2019 0823   CMP Latest Ref Rng & Units 11/12/2019 10/31/2019 10/22/2019  Glucose 70 - 99 mg/dL 121(H) 120(H) 121(H)  BUN 6 - 20 mg/dL 7 11 7   Creatinine 0.61 - 1.24 mg/dL 0.77 0.66 0.75  Sodium 135 - 145 mmol/L 139 139 141  Potassium 3.5 - 5.1 mmol/L 3.6 3.7 3.5  Chloride 98 - 111 mmol/L 108 108 109  CO2 22 - 32 mmol/L 24 24 24   Calcium 8.9 - 10.3 mg/dL 9.2 9.0 8.8(L)  Total Protein 6.5 - 8.1 g/dL 7.0 6.7 6.5  Total Bilirubin 0.3 - 1.2 mg/dL 0.4 0.4 0.4  Alkaline Phos 38 - 126 U/L 120 117 97  AST 15 - 41 U/L 33 33 30  ALT 0 - 44 U/L 61(H) 61(H) 44       DIAGNOSTIC IMAGING:  I have independently reviewed the scans and discussed with the patient.   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  Nodular lymphocyte predominant Hodgkin's lymphoma: -CTAP on 06/15/2019 showed incidental bilateral external iliac adenopathy measuring up to 3.4 cm on the right side.  There is a 5 x 4 cm soft tissue mass in the superficial soft tissues of the anteromedial right upper thigh.  Multiple mildly enlarged upper abdominal lymph nodes measuring up to 13 mm in short axis in the gastrohepatic space.  Several scattered small nodular density in the upper omentum measure up to 7 mm in short axis. 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 Dema Severin count was elevated since 2011, predominantly neutrophils and monocytes. -LDH and beta-2 microglobulin was normal. -CT of the chest showed mediastinal  adenopathy and small left axillary adenopathy. -Biopsy of the left axillary lymph node on 07/26/2019 was benign tissue. -PET scan on 08/12/2019 showed lymphadenopathy in the chest, abdomen and pelvis.  Dominant nodes include a 4.2 cm short axis node in the right inferior inguinal region with the highest SUV and a 2.7 cm short axis node in the right paratracheal region. -Right inguinal lymph node biopsy on 08/26/2019 shows nodular lymphocyte predominant Hodgkin's lymphoma.  Popcorn cells seen.  IHC reveals CD20 positive LP cells and disrupted follicular dendritic networks. -No B symptoms.  However he has pain in the right thigh. -Echocardiogram on 09/19/2019 shows EF 55-60%. -Cycle 1 of R-CHOP on 10/23/2019.  He experienced pains in both eyes started about a week after his treatment.  He is completely pain-free at this time. -I have reviewed his labs today.  He is okay to proceed with cycle 2 without any dose modifications.  He will receive rituximab and chemo on the same day today. -We will see him back in 3 weeks for follow-up.   2.  Tumor lysis prophylaxis: -He will continue allopurinol 300 mg daily.  Uric acid today is normal.  3.  Hypophosphatemia: -His phosphate level is 1.9 (2.5-4.6.).  We will start him on Neutra-Phos 1 tablet twice daily.  4.  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 -No evidence of infection at this time.    Orders placed this encounter:  No orders of the defined types were placed in this encounter.     Derek Jack, MD [place] [phone]

## 2019-11-13 ENCOUNTER — Ambulatory Visit (HOSPITAL_COMMUNITY): Payer: Medicaid Other

## 2019-11-14 ENCOUNTER — Other Ambulatory Visit: Payer: Self-pay

## 2019-11-14 ENCOUNTER — Inpatient Hospital Stay (HOSPITAL_COMMUNITY): Payer: Medicaid Other

## 2019-11-14 VITALS — BP 115/72 | HR 80 | Temp 97.1°F | Resp 18

## 2019-11-14 DIAGNOSIS — Z5112 Encounter for antineoplastic immunotherapy: Secondary | ICD-10-CM | POA: Diagnosis not present

## 2019-11-14 DIAGNOSIS — C8108 Nodular lymphocyte predominant Hodgkin lymphoma, lymph nodes of multiple sites: Secondary | ICD-10-CM

## 2019-11-14 MED ORDER — PEGFILGRASTIM-CBQV 6 MG/0.6ML ~~LOC~~ SOSY
6.0000 mg | PREFILLED_SYRINGE | Freq: Once | SUBCUTANEOUS | Status: AC
  Administered 2019-11-14: 6 mg via SUBCUTANEOUS

## 2019-11-14 MED ORDER — PEGFILGRASTIM-CBQV 6 MG/0.6ML ~~LOC~~ SOSY
PREFILLED_SYRINGE | SUBCUTANEOUS | Status: AC
  Filled 2019-11-14: qty 0.6

## 2019-11-14 NOTE — Progress Notes (Signed)
Udenyca injection given per MD today.   Patient tolerated it well without problems. Vitals stable and discharged home from clinic ambulatory. Follow up as scheduled.

## 2019-11-15 MED ORDER — PEGFILGRASTIM-JMDB 6 MG/0.6ML ~~LOC~~ SOSY
PREFILLED_SYRINGE | SUBCUTANEOUS | Status: AC
  Filled 2019-11-15: qty 0.6

## 2019-11-15 MED ORDER — CYANOCOBALAMIN 1000 MCG/ML IJ SOLN
INTRAMUSCULAR | Status: AC
  Filled 2019-11-15: qty 1

## 2019-11-19 ENCOUNTER — Encounter (HOSPITAL_COMMUNITY): Payer: Self-pay | Admitting: *Deleted

## 2019-11-19 NOTE — Progress Notes (Signed)
Patient called and left a VM stating that he overall doesn't feel well and his finger tips are numb.  He wanted to talk to a nurse.    I attempted to return his call, left VM for him to call us back.

## 2019-11-22 ENCOUNTER — Ambulatory Visit (HOSPITAL_COMMUNITY): Payer: Medicaid Other

## 2019-11-22 NOTE — Progress Notes (Signed)
Nutrition Follow-up:  Patient with hodgkins lymphoma.  Receiving R-CHOP chemotherapy.  Spoke with patient via phone for nutrition follow-up.  Patient reports that he has issues with nausea and decreased appetite during the week, week and half following treatment.  Reports that he has to force himself to eat.  In the weeks following treatment patient reports typically good appetite.  No issues with heartburn per patient.    Patient reports numbness in fingertips, knows that RN tried to call him back.   Medications: compazine, neutra-phos  Labs: Phosphorus 1.9, Mag 2.1   Anthropometrics:   Weight 263 lb 3.2 oz on 1/26 decreased slightly from 265 lb 12.8 oz on 1/5   NUTRITION DIAGNOSIS: Inadequate oral intake stable   INTERVENTION:  Encouraged use of nausea medications Encouraged small frequent meals during the week of treatment to help with nausea.  Spoke with Nathan Hail, RN regarding patient report of numbness in fingertips.  Patient has contact information and will reach out to RD if needed in the future.     MONITORING, EVALUATION, GOAL: Patient will consume adequate calories and protein to maintain lean muscle mass during treatment   NEXT VISIT: patient to contact RD as needed  Nathan Waters, [place], [place] Registered Dietitian [phone] (pager)

## 2019-12-03 ENCOUNTER — Inpatient Hospital Stay (HOSPITAL_COMMUNITY): Payer: Medicaid Other | Attending: Hematology

## 2019-12-03 ENCOUNTER — Inpatient Hospital Stay (HOSPITAL_COMMUNITY): Payer: Medicaid Other

## 2019-12-03 ENCOUNTER — Encounter (HOSPITAL_COMMUNITY): Payer: Self-pay | Admitting: Hematology

## 2019-12-03 ENCOUNTER — Other Ambulatory Visit: Payer: Self-pay

## 2019-12-03 ENCOUNTER — Inpatient Hospital Stay (HOSPITAL_BASED_OUTPATIENT_CLINIC_OR_DEPARTMENT_OTHER): Payer: Medicaid Other | Admitting: Hematology

## 2019-12-03 VITALS — BP 103/69 | HR 83 | Temp 96.9°F | Resp 18

## 2019-12-03 VITALS — BP 109/75 | HR 87 | Temp 97.0°F | Resp 19 | Wt 264.6 lb

## 2019-12-03 DIAGNOSIS — G629 Polyneuropathy, unspecified: Secondary | ICD-10-CM | POA: Diagnosis not present

## 2019-12-03 DIAGNOSIS — Z5112 Encounter for antineoplastic immunotherapy: Secondary | ICD-10-CM | POA: Insufficient documentation

## 2019-12-03 DIAGNOSIS — C8108 Nodular lymphocyte predominant Hodgkin lymphoma, lymph nodes of multiple sites: Secondary | ICD-10-CM | POA: Insufficient documentation

## 2019-12-03 DIAGNOSIS — Z5189 Encounter for other specified aftercare: Secondary | ICD-10-CM | POA: Insufficient documentation

## 2019-12-03 DIAGNOSIS — Z5111 Encounter for antineoplastic chemotherapy: Secondary | ICD-10-CM | POA: Diagnosis present

## 2019-12-03 DIAGNOSIS — C8190 Hodgkin lymphoma, unspecified, unspecified site: Secondary | ICD-10-CM

## 2019-12-03 LAB — COMPREHENSIVE METABOLIC PANEL
ALT: 42 U/L (ref 0–44)
AST: 27 U/L (ref 15–41)
Albumin: 3.8 g/dL (ref 3.5–5.0)
Alkaline Phosphatase: 91 U/L (ref 38–126)
Anion gap: 8 (ref 5–15)
BUN: 8 mg/dL (ref 6–20)
CO2: 24 mmol/L (ref 22–32)
Calcium: 8.7 mg/dL — ABNORMAL LOW (ref 8.9–10.3)
Chloride: 107 mmol/L (ref 98–111)
Creatinine, Ser: 0.76 mg/dL (ref 0.61–1.24)
GFR calc Af Amer: >60 mL/min (ref >60)
GFR calc non Af Amer: >60 mL/min (ref >60)
Glucose, Bld: 111 mg/dL — ABNORMAL HIGH (ref 70–99)
Potassium: 3.8 mmol/L (ref 3.5–5.1)
Sodium: 139 mmol/L (ref 135–145)
Total Bilirubin: 0.5 mg/dL (ref 0.3–1.2)
Total Protein: 6.6 g/dL (ref 6.5–8.1)

## 2019-12-03 LAB — CBC WITH DIFFERENTIAL/PLATELET
Abs Immature Granulocytes: 0.05 10*3/uL (ref 0.00–0.07)
Basophils Absolute: 0.1 10*3/uL (ref 0.0–0.1)
Basophils Relative: 1 %
Eosinophils Absolute: 0.2 10*3/uL (ref 0.0–0.5)
Eosinophils Relative: 2 %
HCT: 41.3 % (ref 39.0–52.0)
Hemoglobin: 13.8 g/dL (ref 13.0–17.0)
Immature Granulocytes: 1 %
Lymphocytes Relative: 16 %
Lymphs Abs: 1.7 10*3/uL (ref 0.7–4.0)
MCH: 33.1 pg (ref 26.0–34.0)
MCHC: 33.4 g/dL (ref 30.0–36.0)
MCV: 99 fL (ref 80.0–100.0)
Monocytes Absolute: 0.9 10*3/uL (ref 0.1–1.0)
Monocytes Relative: 9 %
Neutro Abs: 7.5 10*3/uL (ref 1.7–7.7)
Neutrophils Relative %: 71 %
Platelets: 412 10*3/uL — ABNORMAL HIGH (ref 150–400)
RBC: 4.17 MIL/uL — ABNORMAL LOW (ref 4.22–5.81)
RDW: 16 % — ABNORMAL HIGH (ref 11.5–15.5)
WBC: 10.5 10*3/uL (ref 4.0–10.5)
nRBC: 0 % (ref 0.0–0.2)

## 2019-12-03 LAB — MAGNESIUM: Magnesium: 2.1 mg/dL (ref 1.7–2.4)

## 2019-12-03 LAB — PHOSPHORUS: Phosphorus: 2.6 mg/dL (ref 2.5–4.6)

## 2019-12-03 LAB — URIC ACID: Uric Acid, Serum: 5.4 mg/dL (ref 3.7–8.6)

## 2019-12-03 MED ORDER — HEPARIN SOD (PORK) LOCK FLUSH 100 UNIT/ML IV SOLN
500.0000 [IU] | Freq: Once | INTRAVENOUS | Status: AC | PRN
  Administered 2019-12-03: 500 [IU]

## 2019-12-03 MED ORDER — DIPHENHYDRAMINE HCL 50 MG/ML IJ SOLN
50.0000 mg | Freq: Once | INTRAMUSCULAR | Status: AC
  Administered 2019-12-03: 50 mg via INTRAVENOUS
  Filled 2019-12-03: qty 1

## 2019-12-03 MED ORDER — PALONOSETRON HCL INJECTION 0.25 MG/5ML
0.2500 mg | Freq: Once | INTRAVENOUS | Status: AC
  Administered 2019-12-03: 0.25 mg via INTRAVENOUS
  Filled 2019-12-03: qty 5

## 2019-12-03 MED ORDER — SODIUM CHLORIDE 0.9 % IV SOLN: Freq: Once | INTRAVENOUS | Status: AC

## 2019-12-03 MED ORDER — SODIUM CHLORIDE 0.9 % IV SOLN
150.0000 mg | Freq: Once | INTRAVENOUS | Status: AC
  Administered 2019-12-03: 150 mg via INTRAVENOUS
  Filled 2019-12-03: qty 150

## 2019-12-03 MED ORDER — SODIUM CHLORIDE 0.9 % IV SOLN
750.0000 mg/m2 | Freq: Once | INTRAVENOUS | Status: AC
  Administered 2019-12-03: 1840 mg via INTRAVENOUS
  Filled 2019-12-03: qty 92

## 2019-12-03 MED ORDER — SODIUM CHLORIDE 0.9% FLUSH
10.0000 mL | INTRAVENOUS | Status: DC | PRN
  Administered 2019-12-03: 10 mL

## 2019-12-03 MED ORDER — SODIUM CHLORIDE 0.9 % IV SOLN
10.0000 mg | Freq: Once | INTRAVENOUS | Status: AC
  Administered 2019-12-03: 10 mg via INTRAVENOUS
  Filled 2019-12-03: qty 10

## 2019-12-03 MED ORDER — VINCRISTINE SULFATE CHEMO INJECTION 1 MG/ML
1.0000 mg | Freq: Once | INTRAVENOUS | Status: AC
  Administered 2019-12-03: 1 mg via INTRAVENOUS
  Filled 2019-12-03: qty 1

## 2019-12-03 MED ORDER — FAMOTIDINE IN NACL 20-0.9 MG/50ML-% IV SOLN
20.0000 mg | Freq: Once | INTRAVENOUS | Status: AC
  Administered 2019-12-03: 10:00:00 20 mg via INTRAVENOUS
  Filled 2019-12-03: qty 50

## 2019-12-03 MED ORDER — SODIUM CHLORIDE 0.9 % IV SOLN
375.0000 mg/m2 | Freq: Once | INTRAVENOUS | Status: AC
  Administered 2019-12-03: 900 mg via INTRAVENOUS
  Filled 2019-12-03: qty 50

## 2019-12-03 MED ORDER — ACETAMINOPHEN 325 MG PO TABS
650.0000 mg | ORAL_TABLET | Freq: Once | ORAL | Status: AC
  Administered 2019-12-03: 650 mg via ORAL
  Filled 2019-12-03: qty 2

## 2019-12-03 MED ORDER — DOXORUBICIN HCL CHEMO IV INJECTION 2 MG/ML
50.0000 mg/m2 | Freq: Once | INTRAVENOUS | Status: AC
  Administered 2019-12-03: 124 mg via INTRAVENOUS
  Filled 2019-12-03: qty 62

## 2019-12-03 NOTE — Patient Instructions (Signed)
[facility] Discharge Instructions for Patients Receiving Chemotherapy  Today you received the following chemotherapy agents   To help prevent nausea and vomiting after your treatment, we encourage you to take your nausea medication   If you develop nausea and vomiting that is not controlled by your nausea medication, call the clinic.   BELOW ARE SYMPTOMS THAT SHOULD BE REPORTED IMMEDIATELY:  *FEVER GREATER THAN 100.5 F  *CHILLS WITH OR WITHOUT FEVER  NAUSEA AND VOMITING THAT IS NOT CONTROLLED WITH YOUR NAUSEA MEDICATION  *UNUSUAL SHORTNESS OF BREATH  *UNUSUAL BRUISING OR BLEEDING  TENDERNESS IN MOUTH AND THROAT WITH OR WITHOUT PRESENCE OF ULCERS  *URINARY PROBLEMS  *BOWEL PROBLEMS  UNUSUAL RASH Items with * indicate a potential emergency and should be followed up as soon as possible.  Feel free to call the clinic should you have any questions or concerns. The clinic phone number is [phone].  Please show the CHEMO ALERT CARD at check-in to the Emergency Department and triage nurse.   

## 2019-12-03 NOTE — Progress Notes (Signed)
Patient has been assessed, vital signs and labs have been reviewed by Dr. Delton Coombes. ANC, Creatinine, LFTs, and Platelets are within treatment parameters per Dr. Delton Coombes. The patient is good to proceed with treatment at this time. Due to numbness in his fingers Dr. Delton Coombes will dose reduce.

## 2019-12-03 NOTE — Patient Instructions (Addendum)
[place] at [facility] Discharge Instructions  You were seen today by Dr. Delton Coombes. He went over your recent lab results. He will repeat your PET scan prior to your next visit. He will see you back in 3 weeks for labs, treatment and follow up.   Thank you for choosing [place] at [facility] to provide your oncology and hematology care.  To afford each patient quality time with our provider, please arrive at least 15 minutes before your scheduled appointment time.   If you have a lab appointment with the [facility] please come in thru the  Main Entrance and check in at the main information desk  You need to re-schedule your appointment should you arrive 10 or more minutes late.  We strive to give you quality time with our providers, and arriving late affects you and other patients whose appointments are after yours.  Also, if you no show three or more times for appointments you may be dismissed from the clinic at the providers discretion.     Again, thank you for choosing [facility].  Our hope is that these requests will decrease the amount of time that you wait before being seen by our physicians.       _____________________________________________________________  Should you have questions after your visit to [facility], please contact our office at [phone] between the hours of 8:00 a.m. and 4:30 p.m.  Voicemails left after 4:00 p.m. will not be returned until the following business day.  For prescription refill requests, have your pharmacy contact our office and allow 72 hours.    Cancer Center Support Programs:   > Cancer Support Group  2nd Tuesday of the month 1pm-2pm, Journey Room

## 2019-12-03 NOTE — Assessment & Plan Note (Signed)
1.  Nodular lymphocyte predominant Hodgkin's lymphoma: -2 cycles of R-CHOP on 10/23/2019 and 11/12/2019. -He did not experience any pains in the upper thighs after cycle 2. -I reviewed his labs including LFTs.  White count is normal.  Creatinine is 0.76. -He will proceed with cycle 3 today.  I will schedule him for PET scan in 3 weeks prior to cycle 4.  2.  Peripheral neuropathy: -He started developing numbness in the fingertips which is constant, started 7 days after cycle 2. -We will decrease vincristine to flat dose of 1 mg.  3.  Tumor lysis prophylaxis: -He will continue allopurinol 300 mg daily.  Uric acid today is normal.  4.  Hypophosphatemia: -His phosphate level is normal today.  He will continue Neutra-Phos 1 tablet twice daily.

## 2019-12-03 NOTE — Progress Notes (Signed)
Labs reviewed with MD today. Due to numbness in the fingers, MD is going to dose reduce treatment today.   Treatment given per orders. Patient tolerated it well without problems. Vitals stable and discharged home from clinic ambulatory. Follow up as scheduled.

## 2019-12-03 NOTE — Progress Notes (Signed)
[place], [place] [address]   CLINIC:  Medical Oncology/Hematology  PCP:  Sofie Rower, PA-C [address] [place] [address] [phone]   REASON FOR VISIT:  Follow-up for lymph node biopsy results.  CURRENT THERAPY: R-CHOP.   INTERVAL HISTORY:  Nathan Waters 39 y.o. male seen for follow-up and toxicity assessment prior to cycle 3 of chemotherapy.  Appetite and energy levels are 100%.  Complains of numbness in the fingertips which is constant, started 7 days after cycle 2.  Denies any neuropathy pains.  Denies any trouble tying shoes or buttoning shirts.  He is not dropping things.   REVIEW OF SYSTEMS:  Review of Systems  Neurological: Positive for numbness.  All other systems reviewed and are negative.    PAST MEDICAL/SURGICAL HISTORY:  Past Medical History:  Diagnosis Date  . Asthma   . Coronary atherosclerosis of native coronary artery    BMS to mid circumflex 11/2011, LVEF 55-60%  . Essential hypertension, benign   . Migraine   . Mixed hyperlipidemia   . STEMI (ST elevation myocardial infarction) ([place])    Complicated by VF arrest 11/2011   Past Surgical History:  Procedure Laterality Date  . AXILLARY LYMPH NODE BIOPSY Left 07/26/2019   Procedure: AXILLARY LYMPH NODE BIOPSY;  Surgeon: Aviva Signs, MD;  Location: [facility];  Service: General;  Laterality: Left;  . BACK SURGERY    . CARDIAC CATHETERIZATION    . CHOLECYSTECTOMY N/A 08/26/2019   Procedure: LAPAROSCOPIC CHOLECYSTECTOMY;  Surgeon: Aviva Signs, MD;  Location: [facility];  Service: General;  Laterality: N/A;  . LEFT HEART CATHETERIZATION WITH CORONARY ANGIOGRAM N/A 11/21/2011   Procedure: LEFT HEART CATHETERIZATION WITH CORONARY ANGIOGRAM;  Surgeon: Peter M Martinique, MD;  Location: [facility] CATH LAB;  Service: Cardiovascular;  Laterality: N/A;  . LYMPH NODE BIOPSY Right 08/26/2019   Procedure: LYMPH NODE BIOPSY, INGUINAL;  Surgeon: Aviva Signs, MD;  Location: [facility];   Service: General;  Laterality: Right;  . PERCUTANEOUS CORONARY STENT INTERVENTION (PCI-S) N/A 11/21/2011   Procedure: PERCUTANEOUS CORONARY STENT INTERVENTION (PCI-S);  Surgeon: Peter M Martinique, MD;  Location: [facility] CATH LAB;  Service: Cardiovascular;  Laterality: N/A;  . PORTACATH PLACEMENT Left 10/01/2019   Procedure: INSERTION PORT-A-CATH (catheter attached left subclavian);  Surgeon: Aviva Signs, MD;  Location: [facility];  Service: General;  Laterality: Left;  . SPLENECTOMY       SOCIAL HISTORY:  Social History   Socioeconomic History  . Marital status: Divorced    Spouse name: Not on file  . Number of children: 2  . Years of education: Not on file  . Highest education level: Not on file  Occupational History  . Occupation: not employed  Tobacco Use  . Smoking status: Current Every Day Smoker    Packs/day: 1.00    Types: Cigarettes  . Smokeless tobacco: Never Used  Substance and Sexual Activity  . Alcohol use: Yes    Comment: Occasionally  . Drug use: No  . Sexual activity: Yes    Birth control/protection: Rhythm  Other Topics Concern  . Not on file  Social History Narrative  . Not on file   Social Determinants of Health   Financial Resource Strain: Low Risk   . Difficulty of Paying Living Expenses: Not hard at all  Food Insecurity: No Food Insecurity  . Worried About Charity fundraiser in the Last Year: Never true  . Ran Out of Food in the Last  Year: Never true  Transportation Needs: No Transportation Needs  . Lack of Transportation (Medical): No  . Lack of Transportation (Non-Medical): No  Physical Activity: Insufficiently Active  . Days of Exercise per Week: 3 days  . Minutes of Exercise per Session: 30 min  Stress: No Stress Concern Present  . Feeling of Stress : Only a little  Social Connections: Moderately Isolated  . Frequency of Communication with Friends and Family: More than three times a week  . Frequency of Social Gatherings with Friends and Family:  Twice a week  . Attends Religious Services: Never  . Active Member of Clubs or Organizations: No  . Attends Archivist Meetings: Not asked  . Marital Status: Divorced  Human resources officer Violence: Not At Risk  . Fear of Current or Ex-Partner: No  . Emotionally Abused: No  . Physically Abused: No  . Sexually Abused: No    FAMILY HISTORY:  Family History  Problem Relation Age of Onset  . Malignant hyperthermia Mother   . Cancer Mother   . Malignant hyperthermia Brother   . Heart disease Brother   . Asthma Brother   . COPD Brother   . Heart disease Father   . Cancer Father   . ADD / ADHD Daughter   . ADD / ADHD Son     CURRENT MEDICATIONS:  Outpatient Encounter Medications as of 12/03/2019  Medication Sig  . CYCLOPHOSPHAMIDE IV Inject into the vein every 21 ( twenty-one) days.  Marland Kitchen DOXORUBICIN HCL IV Inject into the vein every 21 ( twenty-one) days.  . K Phos Mono-Sod Phos Di & Mono (PHOSPHA 250 NEUTRAL) [id number] MG TABS Take 1 tablet by mouth 2 (two) times daily.  . predniSONE (DELTASONE) 20 MG tablet Take 5 tablets daily for five days, with each cycle of Chemotherapy  . riTUXimab in sodium chloride 0.9 % 250 mL Inject into the vein every 21 ( twenty-one) days.  . vinCRIStine 2 mg in sodium chloride 0.9 % 50 mL Inject 2 mg into the vein every 21 ( twenty-one) days.  . [DISCONTINUED] allopurinol (ZYLOPRIM) 300 MG tablet Take 1 tablet (300 mg total) by mouth daily.  Marland Kitchen albuterol (PROVENTIL HFA;VENTOLIN HFA) 108 (90 Base) MCG/ACT inhaler Inhale 2 puffs into the lungs every 6 (six) hours as needed for wheezing or shortness of breath.   . cetirizine (ZYRTEC) 10 MG tablet Take 10 mg by mouth daily as needed for allergies.   Marland Kitchen lidocaine-prilocaine (EMLA) cream Apply a small amount to port a cath site and cover with plastic wrap 1 hour prior to chemotherapy appointments (Patient not taking: Reported on 10/31/2019)  . naproxen (NAPROSYN) 500 MG tablet Take 1 tablet (500 mg total)  by mouth 2 (two) times daily with a meal. (Patient not taking: Reported on 11/12/2019)  . nitroGLYCERIN (NITROSTAT) 0.4 MG SL tablet Place 1 tablet (0.4 mg total) under the tongue every 5 (five) minutes as needed for chest pain. (Patient not taking: Reported on 10/31/2019)  . prochlorperazine (COMPAZINE) 10 MG tablet Take 1 tablet (10 mg total) by mouth every 6 (six) hours as needed for nausea or vomiting. (Patient not taking: Reported on 10/31/2019)   No facility-administered encounter medications on file as of 12/03/2019.    ALLERGIES:  Allergies  Allergen Reactions  . Penicillins Anaphylaxis    Did it involve swelling of the face/tongue/throat, SOB, or low BP? Yes Did it involve sudden or severe rash/hives, skin peeling, or any reaction on the inside of your mouth or nose?  No Did you need to seek medical attention at a hospital or doctor's office? Yes When did it last happen?Childhood allergy If all above answers are "NO", may proceed with cephalosporin use.      PHYSICAL EXAM:  ECOG Performance status: 1  Vitals:   12/03/19 0820  BP: 109/75  Pulse: 87  Resp: 19  Temp: (!) 97 F (36.1 C)  SpO2: 95%   Filed Weights   12/03/19 0820  Weight: 264 lb 9.6 oz (120 kg)    Physical Exam Vitals reviewed.  Constitutional:      Appearance: Normal appearance.  Cardiovascular:     Rate and Rhythm: Normal rate and regular rhythm.     Heart sounds: Normal heart sounds.  Pulmonary:     Effort: Pulmonary effort is normal.     Breath sounds: Normal breath sounds.  Abdominal:     General: There is no distension.     Palpations: Abdomen is soft. There is no mass.  Musculoskeletal:        General: No swelling.  Lymphadenopathy:     Cervical: No cervical adenopathy.  Skin:    General: Skin is warm.  Neurological:     Mental Status: He is alert and oriented to person, place, and time.  Psychiatric:        Mood and Affect: Mood normal.        Behavior: Behavior normal.       LABORATORY DATA:  I have reviewed the labs as listed.  CBC    Component Value Date/Time   WBC 10.5 12/03/2019 0822   RBC 4.17 (L) 12/03/2019 0822   HGB 13.8 12/03/2019 0822   HCT 41.3 12/03/2019 0822   PLT 412 (H) 12/03/2019 0822   MCV 99.0 12/03/2019 0822   MCH 33.1 12/03/2019 0822   MCHC 33.4 12/03/2019 0822   RDW 16.0 (H) 12/03/2019 0822   LYMPHSABS 1.7 12/03/2019 0822   MONOABS 0.9 12/03/2019 0822   EOSABS 0.2 12/03/2019 0822   BASOSABS 0.1 12/03/2019 0822   CMP Latest Ref Rng & Units 12/03/2019 11/12/2019 10/31/2019  Glucose 70 - 99 mg/dL 111(H) 121(H) 120(H)  BUN 6 - 20 mg/dL 8 7 11   Creatinine 0.61 - 1.24 mg/dL 0.76 0.77 0.66  Sodium 135 - 145 mmol/L 139 139 139  Potassium 3.5 - 5.1 mmol/L 3.8 3.6 3.7  Chloride 98 - 111 mmol/L 107 108 108  CO2 22 - 32 mmol/L 24 24 24   Calcium 8.9 - 10.3 mg/dL 8.7(L) 9.2 9.0  Total Protein 6.5 - 8.1 g/dL 6.6 7.0 6.7  Total Bilirubin 0.3 - 1.2 mg/dL 0.5 0.4 0.4  Alkaline Phos 38 - 126 U/L 91 120 117  AST 15 - 41 U/L 27 33 33  ALT 0 - 44 U/L 42 61(H) 61(H)       DIAGNOSTIC IMAGING:  I have independently reviewed the scans and discussed with the patient.   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  Nodular lymphocyte predominant Hodgkin's lymphoma: -2 cycles of R-CHOP on 10/23/2019 and 11/12/2019. -He did not experience any pains in the upper thighs after cycle 2. -I reviewed his labs including LFTs.  White count is normal.  Creatinine is 0.76. -He will proceed with cycle 3 today.  I will schedule him for PET scan in 3 weeks prior to cycle 4.  2.  Peripheral neuropathy: -He started developing numbness in the fingertips which is constant, started 7 days after  cycle 2. -We will decrease vincristine to flat dose of 1 mg.  3.  Tumor lysis prophylaxis: -He will continue allopurinol 300 mg daily.  Uric acid today is normal.  4.  Hypophosphatemia: -His  phosphate level is normal today.  He will continue Neutra-Phos 1 tablet twice daily.      Orders placed this encounter:  Orders Placed This Encounter  Procedures  . NM PET Image Restag (PS) Skull Base To Thigh  . CBC with Differential/Platelet  . Comprehensive metabolic panel  . Magnesium  . Phosphorus  . Uric acid      Derek Jack, MD [place] [phone]

## 2019-12-04 ENCOUNTER — Inpatient Hospital Stay (HOSPITAL_COMMUNITY): Payer: Medicaid Other

## 2019-12-04 ENCOUNTER — Encounter (HOSPITAL_COMMUNITY): Payer: Self-pay

## 2019-12-04 VITALS — BP 132/67 | HR 94 | Temp 97.5°F | Resp 18

## 2019-12-04 DIAGNOSIS — Z5112 Encounter for antineoplastic immunotherapy: Secondary | ICD-10-CM | POA: Diagnosis not present

## 2019-12-04 DIAGNOSIS — C8108 Nodular lymphocyte predominant Hodgkin lymphoma, lymph nodes of multiple sites: Secondary | ICD-10-CM

## 2019-12-04 MED ORDER — PEGFILGRASTIM-CBQV 6 MG/0.6ML ~~LOC~~ SOSY
6.0000 mg | PREFILLED_SYRINGE | Freq: Once | SUBCUTANEOUS | Status: AC
  Administered 2019-12-04: 6 mg via SUBCUTANEOUS
  Filled 2019-12-04: qty 0.6

## 2019-12-04 NOTE — Patient Instructions (Signed)
[facility] at [facility] Discharge Instructions  Received Udenyca injection today. Follow-up as scheduled. Call clinic for any questions or concerns   Thank you for choosing [facility] at [facility] to provide your oncology and hematology care.  To afford each patient quality time with our provider, please arrive at least 15 minutes before your scheduled appointment time.   If you have a lab appointment with the Cancer Center please come in thru the Main Entrance and check in at the main information desk.  You need to re-schedule your appointment should you arrive 10 or more minutes late.  We strive to give you quality time with our providers, and arriving late affects you and other patients whose appointments are after yours.  Also, if you no show three or more times for appointments you may be dismissed from the clinic at the providers discretion.     Again, thank you for choosing [facility].  Our hope is that these requests will decrease the amount of time that you wait before being seen by our physicians.       _____________________________________________________________  Should you have questions after your visit to [facility], please contact our office at [phone] between the hours of 8:00 a.m. and 4:30 p.m.  Voicemails left after 4:00 p.m. will not be returned until the following business day.  For prescription refill requests, have your pharmacy contact our office and allow 72 hours.    Due to Covid, you will need to wear a mask upon entering the hospital. If you do not have a mask, a mask will be given to you at the Main Entrance upon arrival. For doctor visits, patients may have 1 support person with them. For treatment visits, patients can not have anyone with them due to social distancing guidelines and our immunocompromised population.     

## 2019-12-04 NOTE — Progress Notes (Signed)
Trinna Balloon Rynders tolerated Udenyca injection well without complaints or incident. VSS Pt discharged self ambulatory in satisfactory condition

## 2019-12-05 ENCOUNTER — Ambulatory Visit (HOSPITAL_COMMUNITY): Payer: Medicaid Other

## 2019-12-19 ENCOUNTER — Other Ambulatory Visit: Payer: Self-pay

## 2019-12-19 ENCOUNTER — Ambulatory Visit (HOSPITAL_COMMUNITY)
Admission: RE | Admit: 2019-12-19 | Discharge: 2019-12-19 | Disposition: A | Discharge status: Home / Self Care | Payer: Medicaid Other | Source: Ambulatory Visit | Attending: Hematology | Admitting: Hematology

## 2019-12-19 DIAGNOSIS — C8108 Nodular lymphocyte predominant Hodgkin lymphoma, lymph nodes of multiple sites: Secondary | ICD-10-CM | POA: Diagnosis present

## 2019-12-19 LAB — GLUCOSE, CAPILLARY: Glucose-Capillary: 110 mg/dL — ABNORMAL HIGH (ref 70–99)

## 2019-12-19 MED ORDER — FLUDEOXYGLUCOSE F - 18 (FDG) INJECTION
13.8000 | Freq: Once | INTRAVENOUS | Status: AC
  Administered 2019-12-19: 13.8 via INTRAVENOUS

## 2019-12-24 ENCOUNTER — Encounter (HOSPITAL_COMMUNITY): Payer: Self-pay | Admitting: Hematology

## 2019-12-24 ENCOUNTER — Other Ambulatory Visit: Payer: Self-pay

## 2019-12-24 ENCOUNTER — Inpatient Hospital Stay (HOSPITAL_BASED_OUTPATIENT_CLINIC_OR_DEPARTMENT_OTHER): Payer: Medicaid Other | Admitting: Hematology

## 2019-12-24 ENCOUNTER — Inpatient Hospital Stay (HOSPITAL_COMMUNITY): Payer: Medicaid Other

## 2019-12-24 ENCOUNTER — Inpatient Hospital Stay (HOSPITAL_COMMUNITY): Payer: Medicaid Other | Attending: Hematology

## 2019-12-24 VITALS — BP 102/63 | HR 70 | Temp 97.7°F | Resp 18

## 2019-12-24 VITALS — BP 127/73 | HR 71 | Temp 97.5°F | Resp 18 | Wt 267.6 lb

## 2019-12-24 DIAGNOSIS — Z5112 Encounter for antineoplastic immunotherapy: Secondary | ICD-10-CM | POA: Diagnosis present

## 2019-12-24 DIAGNOSIS — G629 Polyneuropathy, unspecified: Secondary | ICD-10-CM | POA: Insufficient documentation

## 2019-12-24 DIAGNOSIS — C8108 Nodular lymphocyte predominant Hodgkin lymphoma, lymph nodes of multiple sites: Secondary | ICD-10-CM | POA: Insufficient documentation

## 2019-12-24 DIAGNOSIS — Z5111 Encounter for antineoplastic chemotherapy: Secondary | ICD-10-CM | POA: Diagnosis present

## 2019-12-24 DIAGNOSIS — Z5189 Encounter for other specified aftercare: Secondary | ICD-10-CM | POA: Diagnosis not present

## 2019-12-24 LAB — CBC WITH DIFFERENTIAL/PLATELET
Abs Immature Granulocytes: 0.07 10*3/uL (ref 0.00–0.07)
Basophils Absolute: 0.1 10*3/uL (ref 0.0–0.1)
Basophils Relative: 1 %
Eosinophils Absolute: 0.2 10*3/uL (ref 0.0–0.5)
Eosinophils Relative: 1 %
HCT: 39.5 % (ref 39.0–52.0)
Hemoglobin: 13.1 g/dL (ref 13.0–17.0)
Immature Granulocytes: 1 %
Lymphocytes Relative: 18 %
Lymphs Abs: 2.3 10*3/uL (ref 0.7–4.0)
MCH: 33.6 pg (ref 26.0–34.0)
MCHC: 33.2 g/dL (ref 30.0–36.0)
MCV: 101.3 fL — ABNORMAL HIGH (ref 80.0–100.0)
Monocytes Absolute: 1.3 10*3/uL — ABNORMAL HIGH (ref 0.1–1.0)
Monocytes Relative: 10 %
Neutro Abs: 9.1 10*3/uL — ABNORMAL HIGH (ref 1.7–7.7)
Neutrophils Relative %: 69 %
Platelets: 430 10*3/uL — ABNORMAL HIGH (ref 150–400)
RBC: 3.9 MIL/uL — ABNORMAL LOW (ref 4.22–5.81)
RDW: 18.6 % — ABNORMAL HIGH (ref 11.5–15.5)
WBC: 13 10*3/uL — ABNORMAL HIGH (ref 4.0–10.5)
nRBC: 0 % (ref 0.0–0.2)

## 2019-12-24 LAB — COMPREHENSIVE METABOLIC PANEL
ALT: 29 U/L (ref 0–44)
AST: 22 U/L (ref 15–41)
Albumin: 3.7 g/dL (ref 3.5–5.0)
Alkaline Phosphatase: 84 U/L (ref 38–126)
Anion gap: 7 (ref 5–15)
BUN: 9 mg/dL (ref 6–20)
CO2: 23 mmol/L (ref 22–32)
Calcium: 8.7 mg/dL — ABNORMAL LOW (ref 8.9–10.3)
Chloride: 108 mmol/L (ref 98–111)
Creatinine, Ser: 0.82 mg/dL (ref 0.61–1.24)
GFR calc Af Amer: >60 mL/min (ref >60)
GFR calc non Af Amer: >60 mL/min (ref >60)
Glucose, Bld: 120 mg/dL — ABNORMAL HIGH (ref 70–99)
Potassium: 3.5 mmol/L (ref 3.5–5.1)
Sodium: 138 mmol/L (ref 135–145)
Total Bilirubin: 0.4 mg/dL (ref 0.3–1.2)
Total Protein: 6.5 g/dL (ref 6.5–8.1)

## 2019-12-24 LAB — URIC ACID: Uric Acid, Serum: 5 mg/dL (ref 3.7–8.6)

## 2019-12-24 LAB — MAGNESIUM: Magnesium: 2.1 mg/dL (ref 1.7–2.4)

## 2019-12-24 LAB — PHOSPHORUS: Phosphorus: 2.7 mg/dL (ref 2.5–4.6)

## 2019-12-24 MED ORDER — VINCRISTINE SULFATE CHEMO INJECTION 1 MG/ML
1.0000 mg | Freq: Once | INTRAVENOUS | Status: AC
  Administered 2019-12-24: 1 mg via INTRAVENOUS
  Filled 2019-12-24: qty 1

## 2019-12-24 MED ORDER — SODIUM CHLORIDE 0.9% FLUSH
10.0000 mL | INTRAVENOUS | Status: DC | PRN
  Administered 2019-12-24 (×2): 10 mL

## 2019-12-24 MED ORDER — PALONOSETRON HCL INJECTION 0.25 MG/5ML
0.2500 mg | Freq: Once | INTRAVENOUS | Status: AC
  Administered 2019-12-24: 0.25 mg via INTRAVENOUS
  Filled 2019-12-24: qty 5

## 2019-12-24 MED ORDER — HEPARIN SOD (PORK) LOCK FLUSH 100 UNIT/ML IV SOLN
500.0000 [IU] | Freq: Once | INTRAVENOUS | Status: AC | PRN
  Administered 2019-12-24: 500 [IU]

## 2019-12-24 MED ORDER — FAMOTIDINE IN NACL 20-0.9 MG/50ML-% IV SOLN
20.0000 mg | Freq: Once | INTRAVENOUS | Status: AC
  Administered 2019-12-24: 20 mg via INTRAVENOUS
  Filled 2019-12-24: qty 50

## 2019-12-24 MED ORDER — SODIUM CHLORIDE 0.9 % IV SOLN
375.0000 mg/m2 | Freq: Once | INTRAVENOUS | Status: AC
  Administered 2019-12-24: 900 mg via INTRAVENOUS
  Filled 2019-12-24: qty 50

## 2019-12-24 MED ORDER — SODIUM CHLORIDE 0.9 % IV SOLN
10.0000 mg | Freq: Once | INTRAVENOUS | Status: AC
  Administered 2019-12-24: 10 mg via INTRAVENOUS
  Filled 2019-12-24: qty 10

## 2019-12-24 MED ORDER — DIPHENHYDRAMINE HCL 50 MG/ML IJ SOLN
50.0000 mg | Freq: Once | INTRAMUSCULAR | Status: AC
  Administered 2019-12-24: 50 mg via INTRAVENOUS
  Filled 2019-12-24: qty 1

## 2019-12-24 MED ORDER — ACETAMINOPHEN 325 MG PO TABS
650.0000 mg | ORAL_TABLET | Freq: Once | ORAL | Status: AC
  Administered 2019-12-24: 650 mg via ORAL
  Filled 2019-12-24: qty 2

## 2019-12-24 MED ORDER — DOXORUBICIN HCL CHEMO IV INJECTION 2 MG/ML
50.0000 mg/m2 | Freq: Once | INTRAVENOUS | Status: AC
  Administered 2019-12-24: 124 mg via INTRAVENOUS
  Filled 2019-12-24: qty 62

## 2019-12-24 MED ORDER — SODIUM CHLORIDE 0.9 % IV SOLN
750.0000 mg/m2 | Freq: Once | INTRAVENOUS | Status: AC
  Administered 2019-12-24: 1840 mg via INTRAVENOUS
  Filled 2019-12-24: qty 92

## 2019-12-24 MED ORDER — SODIUM CHLORIDE 0.9 % IV SOLN
150.0000 mg | Freq: Once | INTRAVENOUS | Status: AC
  Administered 2019-12-24: 150 mg via INTRAVENOUS
  Filled 2019-12-24: qty 150

## 2019-12-24 MED ORDER — SODIUM CHLORIDE 0.9 % IV SOLN: Freq: Once | INTRAVENOUS | Status: AC

## 2019-12-24 NOTE — Patient Instructions (Signed)
[facility] at [facility] Discharge Instructions  You were seen today by Dr. Katragadda. He went over your recent lab results. He will see you back in 3 weeks for labs, treatment and follow up.   Thank you for choosing [facility] at [facility] to provide your oncology and hematology care.  To afford each patient quality time with our provider, please arrive at least 15 minutes before your scheduled appointment time.   If you have a lab appointment with the Cancer Center please come in thru the  Main Entrance and check in at the main information desk  You need to re-schedule your appointment should you arrive 10 or more minutes late.  We strive to give you quality time with our providers, and arriving late affects you and other patients whose appointments are after yours.  Also, if you no show three or more times for appointments you may be dismissed from the clinic at the providers discretion.     Again, thank you for choosing [facility].  Our hope is that these requests will decrease the amount of time that you wait before being seen by our physicians.       _____________________________________________________________  Should you have questions after your visit to [facility], please contact our office at [phone] between the hours of 8:00 a.m. and 4:30 p.m.  Voicemails left after 4:00 p.m. will not be returned until the following business day.  For prescription refill requests, have your pharmacy contact our office and allow 72 hours.    Cancer Center Support Programs:   > Cancer Support Group  2nd Tuesday of the month 1pm-2pm, Journey Room    

## 2019-12-24 NOTE — Assessment & Plan Note (Signed)
1.  Nodular lymphocyte predominant Hodgkin's lymphoma: -3 cycles of R-CHOP from 10/23/2019 through 12/03/2019. -I reviewed PET scan results which showed decrease in size and activity of lymphadenopathy with De[place] 3 response. -I have reviewed his CBC and LFTs which are grossly within normal limits. -He will proceed with cycle 4 today.  We will plan to repeat scan after cycle 6.  We will see him back in 3 weeks for follow-up.  2.  Peripheral neuropathy: -I have dose reduce his vincristine to 1 mg. -He has numbness in the fingertips which is constant.  He has been dropping few things.  We will keep a close eye on it.  3.  Hypophosphatemia: -Phosphate level today is normal.  He will continue Neutra-Phos 1 tablet twice daily.  4.  Tumor lysis prophylaxis: -He has finished allopurinol.  Electrolytes are remaining normal.  Uric acid is 5 today.

## 2019-12-24 NOTE — Patient Instructions (Signed)
[facility] Discharge Instructions for Patients Receiving Chemotherapy  Today you received the following chemotherapy agents   To help prevent nausea and vomiting after your treatment, we encourage you to take your nausea medication   If you develop nausea and vomiting that is not controlled by your nausea medication, call the clinic.   BELOW ARE SYMPTOMS THAT SHOULD BE REPORTED IMMEDIATELY:  *FEVER GREATER THAN 100.5 F  *CHILLS WITH OR WITHOUT FEVER  NAUSEA AND VOMITING THAT IS NOT CONTROLLED WITH YOUR NAUSEA MEDICATION  *UNUSUAL SHORTNESS OF BREATH  *UNUSUAL BRUISING OR BLEEDING  TENDERNESS IN MOUTH AND THROAT WITH OR WITHOUT PRESENCE OF ULCERS  *URINARY PROBLEMS  *BOWEL PROBLEMS  UNUSUAL RASH Items with * indicate a potential emergency and should be followed up as soon as possible.  Feel free to call the clinic should you have any questions or concerns. The clinic phone number is [phone].  Please show the CHEMO ALERT CARD at check-in to the Emergency Department and triage nurse.   

## 2019-12-24 NOTE — Progress Notes (Signed)
[place], [place] [address]   CLINIC:  Medical Oncology/Hematology  PCP:  Sofie Rower, PA-C [place] [address] [place]  [address] [phone]   REASON FOR VISIT:  Follow-up for lymph node biopsy results.  CURRENT THERAPY: R-CHOP.   INTERVAL HISTORY:  Nathan Waters 39 y.o. male seen for follow-up and toxicity assessment prior to cycle 4 of chemotherapy.  He continues to have numbness which is constant in the fingertips.  He is occasionally dropping things.  Appetite is 100%.  Energy levels are 50%.  Denies any GI side effects.  No fevers or chills reported.  REVIEW OF SYSTEMS:  Review of Systems  Neurological: Positive for numbness.  All other systems reviewed and are negative.    PAST MEDICAL/SURGICAL HISTORY:  Past Medical History:  Diagnosis Date  . Asthma   . Coronary atherosclerosis of native coronary artery    BMS to mid circumflex 11/2011, LVEF 55-60%  . Essential hypertension, benign   . Migraine   . Mixed hyperlipidemia   . STEMI (ST elevation myocardial infarction) ([place])    Complicated by VF arrest 11/2011   Past Surgical History:  Procedure Laterality Date  . AXILLARY LYMPH NODE BIOPSY Left 07/26/2019   Procedure: AXILLARY LYMPH NODE BIOPSY;  Surgeon: Aviva Signs, MD;  Location: [facility];  Service: General;  Laterality: Left;  . BACK SURGERY    . CARDIAC CATHETERIZATION    . CHOLECYSTECTOMY N/A 08/26/2019   Procedure: LAPAROSCOPIC CHOLECYSTECTOMY;  Surgeon: Aviva Signs, MD;  Location: [facility];  Service: General;  Laterality: N/A;  . LEFT HEART CATHETERIZATION WITH CORONARY ANGIOGRAM N/A 11/21/2011   Procedure: LEFT HEART CATHETERIZATION WITH CORONARY ANGIOGRAM;  Surgeon: Peter M Martinique, MD;  Location: [facility] C[facility] LAB;  Service: Cardiovascular;  Laterality: N/A;  . LYMPH NODE BIOPSY Right 08/26/2019   Procedure: LYMPH NODE BIOPSY, INGUINAL;  Surgeon: Aviva Signs, MD;  Location: [facility];  Service: General;  Laterality:  Right;  . PERCUTANEOUS CORONARY STENT INTERVENTION (PCI-S) N/A 11/21/2011   Procedure: PERCUTANEOUS CORONARY STENT INTERVENTION (PCI-S);  Surgeon: Peter M Martinique, MD;  Location: [facility] CATH LAB;  Service: Cardiovascular;  Laterality: N/A;  . PORTACATH PLACEMENT Left 10/01/2019   Procedure: INSERTION PORT-A-CATH (catheter attached left subclavian);  Surgeon: Aviva Signs, MD;  Location: [facility];  Service: General;  Laterality: Left;  . SPLENECTOMY       SOCIAL HISTORY:  Social History   Socioeconomic History  . Marital status: Divorced    Spouse name: Not on file  . Number of children: 2  . Years of education: Not on file  . Highest education level: Not on file  Occupational History  . Occupation: not employed  Tobacco Use  . Smoking status: Current Every Day Smoker    Packs/day: 1.00    Types: Cigarettes  . Smokeless tobacco: Never Used  Substance and Sexual Activity  . Alcohol use: Yes    Comment: Occasionally  . Drug use: No  . Sexual activity: Yes    Birth control/protection: Rhythm  Other Topics Concern  . Not on file  Social History Narrative  . Not on file   Social Determinants of Health   Financial Resource Strain: Low Risk   . Difficulty of Paying Living Expenses: Not hard at all  Food Insecurity: No Food Insecurity  . Worried About Charity fundraiser in the Last Year: Never true  . Ran Out of Food in the Last Year: Never true  Transportation  Needs: No Transportation Needs  . Lack of Transportation (Medical): No  . Lack of Transportation (Non-Medical): No  Physical Activity: Insufficiently Active  . Days of Exercise per Week: 3 days  . Minutes of Exercise per Session: 30 min  Stress: No Stress Concern Present  . Feeling of Stress : Only a little  Social Connections: Moderately Isolated  . Frequency of Communication with Friends and Family: More than three times a week  . Frequency of Social Gatherings with Friends and Family: Twice a week  . Attends Religious  Services: Never  . Active Member of Clubs or Organizations: No  . Attends Archivist Meetings: Not asked  . Marital Status: Divorced  Human resources officer Violence: Not At Risk  . Fear of Current or Ex-Partner: No  . Emotionally Abused: No  . Physically Abused: No  . Sexually Abused: No    FAMILY HISTORY:  Family History  Problem Relation Age of Onset  . Malignant hyperthermia Mother   . Cancer Mother   . Malignant hyperthermia Brother   . Heart disease Brother   . Asthma Brother   . COPD Brother   . Heart disease Father   . Cancer Father   . ADD / ADHD Daughter   . ADD / ADHD Son     CURRENT MEDICATIONS:  Outpatient Encounter Medications as of 12/24/2019  Medication Sig  . albuterol (PROVENTIL HFA;VENTOLIN HFA) 108 (90 Base) MCG/ACT inhaler Inhale 2 puffs into the lungs every 6 (six) hours as needed for wheezing or shortness of breath.   . CYCLOPHOSPHAMIDE IV Inject into the vein every 21 ( twenty-one) days.  Marland Kitchen DOXORUBICIN HCL IV Inject into the vein every 21 ( twenty-one) days.  Marland Kitchen lidocaine-prilocaine (EMLA) cream Apply a small amount to port a cath site and cover with plastic wrap 1 hour prior to chemotherapy appointments  . predniSONE (DELTASONE) 20 MG tablet Take 5 tablets daily for five days, with each cycle of Chemotherapy  . riTUXimab in sodium chloride 0.9 % 250 mL Inject into the vein every 21 ( twenty-one) days.  . vinCRIStine 2 mg in sodium chloride 0.9 % 50 mL Inject 2 mg into the vein every 21 ( twenty-one) days.  . cetirizine (ZYRTEC) 10 MG tablet Take 10 mg by mouth daily as needed for allergies.   . K Phos Mono-Sod Phos Di & Mono (PHOSPHA 250 NEUTRAL) 155-852-130 MG TABS Take 1 tablet by mouth 2 (two) times daily.  . naproxen (NAPROSYN) 500 MG tablet Take 1 tablet (500 mg total) by mouth 2 (two) times daily with a meal. (Patient not taking: Reported on 11/12/2019)  . nitroGLYCERIN (NITROSTAT) 0.4 MG SL tablet Place 1 tablet (0.4 mg total) under the tongue  every 5 (five) minutes as needed for chest pain. (Patient not taking: Reported on 10/31/2019)  . prochlorperazine (COMPAZINE) 10 MG tablet Take 1 tablet (10 mg total) by mouth every 6 (six) hours as needed for nausea or vomiting. (Patient not taking: Reported on 10/31/2019)   No facility-administered encounter medications on file as of 12/24/2019.    ALLERGIES:  Allergies  Allergen Reactions  . Penicillins Anaphylaxis    Did it involve swelling of the face/tongue/throat, SOB, or low BP? Yes Did it involve sudden or severe rash/hives, skin peeling, or any reaction on the inside of your mouth or nose? No Did you need to seek medical attention at a hospital or doctor's office? Yes When did it last happen?Childhood allergy If all above answers are "NO", may  proceed with cephalosporin use.      PHYSICAL EXAM:  ECOG Performance status: 1  Vitals:   12/24/19 0847  BP: 127/73  Pulse: 71  Resp: 18  Temp: (!) 97.5 F (36.4 C)  SpO2: 96%   Filed Weights   12/24/19 0847  Weight: 267 lb 9.6 oz (121.4 kg)    Physical Exam Vitals reviewed.  Constitutional:      Appearance: Normal appearance.  Cardiovascular:     Rate and Rhythm: Normal rate and regular rhythm.     Heart sounds: Normal heart sounds.  Pulmonary:     Effort: Pulmonary effort is normal.     Breath sounds: Normal breath sounds.  Abdominal:     General: There is no distension.     Palpations: Abdomen is soft. There is no mass.  Musculoskeletal:        General: No swelling.  Lymphadenopathy:     Cervical: No cervical adenopathy.  Skin:    General: Skin is warm.  Neurological:     Mental Status: He is alert and oriented to person, place, and time.  Psychiatric:        Mood and Affect: Mood normal.        Behavior: Behavior normal.      LABORATORY DATA:  I have reviewed the labs as listed.  CBC    Component Value Date/Time   WBC 13.0 (H) 12/24/2019 0841   RBC 3.90 (L) 12/24/2019 0841   HGB 13.1  12/24/2019 0841   HCT 39.5 12/24/2019 0841   PLT 430 (H) 12/24/2019 0841   MCV 101.3 (H) 12/24/2019 0841   MCH 33.6 12/24/2019 0841   MCHC 33.2 12/24/2019 0841   RDW 18.6 (H) 12/24/2019 0841   LYMPHSABS 2.3 12/24/2019 0841   MONOABS 1.3 (H) 12/24/2019 0841   EOSABS 0.2 12/24/2019 0841   BASOSABS 0.1 12/24/2019 0841   CMP Latest Ref Rng & Units 12/24/2019 12/03/2019 11/12/2019  Glucose 70 - 99 mg/dL 120(H) 111(H) 121(H)  BUN 6 - 20 mg/dL 9 8 7   Creatinine 0.61 - 1.24 mg/dL 0.82 0.76 0.77  Sodium 135 - 145 mmol/L 138 139 139  Potassium 3.5 - 5.1 mmol/L 3.5 3.8 3.6  Chloride 98 - 111 mmol/L 108 107 108  CO2 22 - 32 mmol/L 23 24 24   Calcium 8.9 - 10.3 mg/dL 8.7(L) 8.7(L) 9.2  Total Protein 6.5 - 8.1 g/dL 6.5 6.6 7.0  Total Bilirubin 0.3 - 1.2 mg/dL 0.4 0.5 0.4  Alkaline Phos 38 - 126 U/L 84 91 120  AST 15 - 41 U/L 22 27 33  ALT 0 - 44 U/L 29 42 61(H)       DIAGNOSTIC IMAGING:  I have independently reviewed his scans.   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  Nodular lymphocyte predominant Hodgkin's lymphoma: -3 cycles of R-CHOP from 10/23/2019 through 12/03/2019. -I reviewed PET scan results which showed decrease in size and activity of lymphadenopathy with Deauville 3 response. -I have reviewed his CBC and LFTs which are grossly within normal limits. -He will proceed with cycle 4 today.  We will plan to repeat scan after cycle 6.  We will see him back in 3 weeks for follow-up.  2.  Peripheral neuropathy: -I have dose reduce his vincristine to 1 mg. -He has numbness in the fingertips which is constant.  He has been dropping few things.  We will keep a close eye on it.  3.  Hypophosphatemia: -Phosphate level today is normal.  He will continue Neutra-Phos 1 tablet twice daily.  4.  Tumor lysis prophylaxis: -He has finished allopurinol.  Electrolytes are remaining normal.  Uric acid is 5  today.    Orders placed this encounter:  Orders Placed This Encounter  Procedures  . CBC with Differential/Platelet  . Comprehensive metabolic panel  . Magnesium  . Uric acid      Derek Jack, MD [place] [phone]

## 2019-12-24 NOTE — Progress Notes (Signed)
Good blood return noted before, during, and after administration of Adriamycin.  Port site clean and dry with no bruising or swelling at site.

## 2019-12-24 NOTE — Progress Notes (Signed)
Treatment given today per MD orders. Tolerated infusion without adverse affects. Vital signs stable. No complaints at this time. Discharged from clinic ambulatory. F/U with [facility] as scheduled.   

## 2019-12-24 NOTE — Progress Notes (Signed)
Patient has been assessed, vital signs and labs have been reviewed by Dr. Katragadda. ANC, Creatinine, LFTs, and Platelets are within treatment parameters per Dr. Katragadda. The patient is good to proceed with treatment at this time.  

## 2019-12-26 ENCOUNTER — Encounter (HOSPITAL_COMMUNITY): Payer: Self-pay

## 2019-12-26 ENCOUNTER — Other Ambulatory Visit: Payer: Self-pay

## 2019-12-26 ENCOUNTER — Inpatient Hospital Stay (HOSPITAL_COMMUNITY): Payer: Medicaid Other

## 2019-12-26 VITALS — BP 124/76 | HR 89 | Temp 97.7°F | Resp 18

## 2019-12-26 DIAGNOSIS — C8108 Nodular lymphocyte predominant Hodgkin lymphoma, lymph nodes of multiple sites: Secondary | ICD-10-CM

## 2019-12-26 DIAGNOSIS — Z5112 Encounter for antineoplastic immunotherapy: Secondary | ICD-10-CM | POA: Diagnosis not present

## 2019-12-26 MED ORDER — PEGFILGRASTIM-CBQV 6 MG/0.6ML ~~LOC~~ SOSY
6.0000 mg | PREFILLED_SYRINGE | Freq: Once | SUBCUTANEOUS | Status: AC
  Administered 2019-12-26: 6 mg via SUBCUTANEOUS
  Filled 2019-12-26: qty 0.6

## 2019-12-26 NOTE — Progress Notes (Signed)
Patient tolerated injection with no complaints voiced.  Site clean and dry with no bruising or swelling noted at site.  Band aid applied.  Vss with discharge and left ambulatory with no s/s of distress noted.  

## 2020-01-09 NOTE — Progress Notes (Signed)

## 2020-01-14 ENCOUNTER — Inpatient Hospital Stay (HOSPITAL_COMMUNITY): Payer: Medicaid Other

## 2020-01-14 ENCOUNTER — Encounter (HOSPITAL_COMMUNITY): Payer: Self-pay

## 2020-01-14 ENCOUNTER — Inpatient Hospital Stay (HOSPITAL_BASED_OUTPATIENT_CLINIC_OR_DEPARTMENT_OTHER): Payer: Medicaid Other | Admitting: Hematology

## 2020-01-14 ENCOUNTER — Other Ambulatory Visit: Payer: Self-pay

## 2020-01-14 ENCOUNTER — Encounter (HOSPITAL_COMMUNITY): Payer: Self-pay | Admitting: Hematology

## 2020-01-14 VITALS — BP 107/59 | HR 69 | Temp 96.3°F | Resp 17

## 2020-01-14 DIAGNOSIS — C8108 Nodular lymphocyte predominant Hodgkin lymphoma, lymph nodes of multiple sites: Secondary | ICD-10-CM | POA: Diagnosis not present

## 2020-01-14 DIAGNOSIS — Z5112 Encounter for antineoplastic immunotherapy: Secondary | ICD-10-CM | POA: Diagnosis not present

## 2020-01-14 LAB — COMPREHENSIVE METABOLIC PANEL
ALT: 27 U/L (ref 0–44)
AST: 18 U/L (ref 15–41)
Albumin: 3.5 g/dL (ref 3.5–5.0)
Alkaline Phosphatase: 81 U/L (ref 38–126)
Anion gap: 7 (ref 5–15)
BUN: 9 mg/dL (ref 6–20)
CO2: 24 mmol/L (ref 22–32)
Calcium: 8.9 mg/dL (ref 8.9–10.3)
Chloride: 110 mmol/L (ref 98–111)
Creatinine, Ser: 0.79 mg/dL (ref 0.61–1.24)
GFR calc Af Amer: >60 mL/min (ref >60)
GFR calc non Af Amer: >60 mL/min (ref >60)
Glucose, Bld: 120 mg/dL — ABNORMAL HIGH (ref 70–99)
Potassium: 3.7 mmol/L (ref 3.5–5.1)
Sodium: 141 mmol/L (ref 135–145)
Total Bilirubin: 0.5 mg/dL (ref 0.3–1.2)
Total Protein: 6 g/dL — ABNORMAL LOW (ref 6.5–8.1)

## 2020-01-14 LAB — CBC WITH DIFFERENTIAL/PLATELET
Abs Immature Granulocytes: 0.03 10*3/uL (ref 0.00–0.07)
Basophils Absolute: 0.1 10*3/uL (ref 0.0–0.1)
Basophils Relative: 1 %
Eosinophils Absolute: 0 10*3/uL (ref 0.0–0.5)
Eosinophils Relative: 0 %
HCT: 39 % (ref 39.0–52.0)
Hemoglobin: 13 g/dL (ref 13.0–17.0)
Immature Granulocytes: 0 %
Lymphocytes Relative: 20 %
Lymphs Abs: 1.4 10*3/uL (ref 0.7–4.0)
MCH: 34.9 pg — ABNORMAL HIGH (ref 26.0–34.0)
MCHC: 33.3 g/dL (ref 30.0–36.0)
MCV: 104.8 fL — ABNORMAL HIGH (ref 80.0–100.0)
Monocytes Absolute: 0.6 10*3/uL (ref 0.1–1.0)
Monocytes Relative: 9 %
Neutro Abs: 4.9 10*3/uL (ref 1.7–7.7)
Neutrophils Relative %: 70 %
Platelets: 444 10*3/uL — ABNORMAL HIGH (ref 150–400)
RBC: 3.72 MIL/uL — ABNORMAL LOW (ref 4.22–5.81)
RDW: 18.3 % — ABNORMAL HIGH (ref 11.5–15.5)
WBC: 7 10*3/uL (ref 4.0–10.5)
nRBC: 0 % (ref 0.0–0.2)

## 2020-01-14 LAB — MAGNESIUM: Magnesium: 2.1 mg/dL (ref 1.7–2.4)

## 2020-01-14 LAB — URIC ACID: Uric Acid, Serum: 4.8 mg/dL (ref 3.7–8.6)

## 2020-01-14 MED ORDER — SODIUM CHLORIDE 0.9 % IV SOLN
750.0000 mg/m2 | Freq: Once | INTRAVENOUS | Status: AC
  Administered 2020-01-14: 1840 mg via INTRAVENOUS
  Filled 2020-01-14: qty 92

## 2020-01-14 MED ORDER — DIPHENHYDRAMINE HCL 50 MG/ML IJ SOLN
INTRAMUSCULAR | Status: AC
  Filled 2020-01-14: qty 1

## 2020-01-14 MED ORDER — PALONOSETRON HCL INJECTION 0.25 MG/5ML
0.2500 mg | Freq: Once | INTRAVENOUS | Status: AC
  Administered 2020-01-14: 0.25 mg via INTRAVENOUS

## 2020-01-14 MED ORDER — FAMOTIDINE IN NACL 20-0.9 MG/50ML-% IV SOLN
INTRAVENOUS | Status: AC
  Filled 2020-01-14: qty 50

## 2020-01-14 MED ORDER — HEPARIN SOD (PORK) LOCK FLUSH 100 UNIT/ML IV SOLN
500.0000 [IU] | Freq: Once | INTRAVENOUS | Status: AC | PRN
  Administered 2020-01-14: 500 [IU]

## 2020-01-14 MED ORDER — ACETAMINOPHEN 325 MG PO TABS
650.0000 mg | ORAL_TABLET | Freq: Once | ORAL | Status: AC
  Administered 2020-01-14: 650 mg via ORAL

## 2020-01-14 MED ORDER — DOXORUBICIN HCL CHEMO IV INJECTION 2 MG/ML
50.0000 mg/m2 | Freq: Once | INTRAVENOUS | Status: AC
  Administered 2020-01-14: 124 mg via INTRAVENOUS
  Filled 2020-01-14: qty 62

## 2020-01-14 MED ORDER — SODIUM CHLORIDE 0.9% FLUSH
10.0000 mL | INTRAVENOUS | Status: DC | PRN
  Administered 2020-01-14 (×2): 10 mL

## 2020-01-14 MED ORDER — SODIUM CHLORIDE 0.9 % IV SOLN
10.0000 mg | Freq: Once | INTRAVENOUS | Status: AC
  Administered 2020-01-14: 10 mg via INTRAVENOUS
  Filled 2020-01-14: qty 10

## 2020-01-14 MED ORDER — SODIUM CHLORIDE 0.9 % IV SOLN
150.0000 mg | Freq: Once | INTRAVENOUS | Status: AC
  Administered 2020-01-14: 150 mg via INTRAVENOUS
  Filled 2020-01-14: qty 150

## 2020-01-14 MED ORDER — FAMOTIDINE IN NACL 20-0.9 MG/50ML-% IV SOLN
20.0000 mg | Freq: Once | INTRAVENOUS | Status: AC
  Administered 2020-01-14: 20 mg via INTRAVENOUS

## 2020-01-14 MED ORDER — VINCRISTINE SULFATE CHEMO INJECTION 1 MG/ML
1.0000 mg | Freq: Once | INTRAVENOUS | Status: AC
  Administered 2020-01-14: 1 mg via INTRAVENOUS
  Filled 2020-01-14: qty 1

## 2020-01-14 MED ORDER — PALONOSETRON HCL INJECTION 0.25 MG/5ML
INTRAVENOUS | Status: AC
  Filled 2020-01-14: qty 5

## 2020-01-14 MED ORDER — SODIUM CHLORIDE 0.9 % IV SOLN
375.0000 mg/m2 | Freq: Once | INTRAVENOUS | Status: AC
  Administered 2020-01-14: 900 mg via INTRAVENOUS
  Filled 2020-01-14: qty 40

## 2020-01-14 MED ORDER — SODIUM CHLORIDE 0.9 % IV SOLN: Freq: Once | INTRAVENOUS | Status: AC

## 2020-01-14 MED ORDER — DIPHENHYDRAMINE HCL 50 MG/ML IJ SOLN
50.0000 mg | Freq: Once | INTRAMUSCULAR | Status: AC
  Administered 2020-01-14: 50 mg via INTRAVENOUS

## 2020-01-14 MED ORDER — ACETAMINOPHEN 325 MG PO TABS
ORAL_TABLET | ORAL | Status: AC
  Filled 2020-01-14: qty 2

## 2020-01-14 NOTE — Patient Instructions (Signed)
[facility] Discharge Instructions for Patients Receiving Chemotherapy  Today you received the following chemotherapy agents   To help prevent nausea and vomiting after your treatment, we encourage you to take your nausea medication   If you develop nausea and vomiting that is not controlled by your nausea medication, call the clinic.   BELOW ARE SYMPTOMS THAT SHOULD BE REPORTED IMMEDIATELY:  *FEVER GREATER THAN 100.5 F  *CHILLS WITH OR WITHOUT FEVER  NAUSEA AND VOMITING THAT IS NOT CONTROLLED WITH YOUR NAUSEA MEDICATION  *UNUSUAL SHORTNESS OF BREATH  *UNUSUAL BRUISING OR BLEEDING  TENDERNESS IN MOUTH AND THROAT WITH OR WITHOUT PRESENCE OF ULCERS  *URINARY PROBLEMS  *BOWEL PROBLEMS  UNUSUAL RASH Items with * indicate a potential emergency and should be followed up as soon as possible.  Feel free to call the clinic should you have any questions or concerns. The clinic phone number is [phone].  Please show the CHEMO ALERT CARD at check-in to the Emergency Department and triage nurse.   

## 2020-01-14 NOTE — Progress Notes (Signed)
Nathan Waters, [place] [address]   CLINIC:  Medical Oncology/Hematology  PCP:  Sofie Rower, PA-C [place] [address] [place] [address] [phone]   REASON FOR VISIT:  Follow-up for lymph node biopsy results.  CURRENT THERAPY: R-CHOP.   INTERVAL HISTORY:  Nathan Waters 39 y.o. male seen for follow-up and toxicity assessment prior to cycle 5 of chemotherapy.  He felt tired for the first week.  Tiredness improved during second and third weeks to baseline.  He has constant numbness in the fingertips but denies any trouble buttoning shirts or tying shoes.  He has not dropped any objects.  Appetite is 100%.  Energy levels are 75%.  Denies any fevers or chills.   REVIEW OF SYSTEMS:  Review of Systems  Neurological: Positive for numbness.  All other systems reviewed and are negative.    PAST MEDICAL/SURGICAL HISTORY:  Past Medical History:  Diagnosis Date  . Asthma   . Coronary atherosclerosis of native coronary artery    BMS to mid circumflex 11/2011, LVEF 55-60%  . Essential hypertension, benign   . Migraine   . Mixed hyperlipidemia   . STEMI (ST elevation myocardial infarction) ([place])    Complicated by VF arrest 11/2011   Past Surgical History:  Procedure Laterality Date  . AXILLARY LYMPH NODE BIOPSY Left 07/26/2019   Procedure: AXILLARY LYMPH NODE BIOPSY;  Surgeon: Aviva Signs, MD;  Location: [facility]S;  Service: General;  Laterality: Left;  . BACK SURGERY    . CARDIAC CATHETERIZATION    . CHOLECYSTECTOMY N/A 08/26/2019   Procedure: LAPAROSCOPIC CHOLECYSTECTOMY;  Surgeon: Aviva Signs, MD;  Location: [facility];  Service: General;  Laterality: N/A;  . LEFT HEART CATHETERIZATION WITH CORONARY ANGIOGRAM N/A 11/21/2011   Procedure: LEFT HEART CATHETERIZATION WITH CORONARY ANGIOGRAM;  Surgeon: Peter M Martinique, MD;  Location[facility] CATH LAB;  Service: Cardiovascular;  Laterality: N/A;  . LYMPH NODE BIOPSY Right 08/26/2019   Procedure: LYMPH NODE  BIOPSY, INGUINAL;  Surgeon: Aviva Signs, MD;  Location: [facility];  Service: General;  Laterality: Right;  . PERCUTANEOUS CORONARY STENT INTERVENTION (PCI-S) N/A 11/21/2011   Procedure: PERCUTANEOUS CORONARY STENT INTERVENTION (PCI-S);  Surgeon: Peter M Martinique, MD;  Location: [facility] CATH LAB;  Service: Cardiovascular;  Laterality: N/A;  . PORTACATH PLACEMENT Left 10/01/2019   Procedure: INSERTION PORT-A-CATH (catheter attached left subclavian);  Surgeon: Aviva Signs, MD;  Location: [facility];  Service: General;  Laterality: Left;  . SPLENECTOMY       SOCIAL HISTORY:  Social History   Socioeconomic History  . Marital status: Divorced    Spouse name: Not on file  . Number of children: 2  . Years of education: Not on file  . Highest education level: Not on file  Occupational History  . Occupation: not employed  Tobacco Use  . Smoking status: Current Every Day Smoker    Packs/day: 1.00    Types: Cigarettes  . Smokeless tobacco: Never Used  Substance and Sexual Activity  . Alcohol use: Yes    Comment: Occasionally  . Drug use: No  . Sexual activity: Yes    Birth control/protection: Rhythm  Other Topics Concern  . Not on file  Social History Narrative  . Not on file   Social Determinants of Health   Financial Resource Strain: Low Risk   . Difficulty of Paying Living Expenses: Not hard at all  Food Insecurity: No Food Insecurity  . Worried About Charity fundraiser in  the Last Year: Never true  . Ran Out of Food in the Last Year: Never true  Transportation Needs: No Transportation Needs  . Lack of Transportation (Medical): No  . Lack of Transportation (Non-Medical): No  Physical Activity: Insufficiently Active  . Days of Exercise per Week: 3 days  . Minutes of Exercise per Session: 30 min  Stress: No Stress Concern Present  . Feeling of Stress : Only a little  Social Connections: Moderately Isolated  . Frequency of Communication with Friends and Family: More than three times a  week  . Frequency of Social Gatherings with Friends and Family: Twice a week  . Attends Religious Services: Never  . Active Member of Clubs or Organizations: No  . Attends Archivist Meetings: Not asked  . Marital Status: Divorced  Human resources officer Violence: Not At Risk  . Fear of Current or Ex-Partner: No  . Emotionally Abused: No  . Physically Abused: No  . Sexually Abused: No    FAMILY HISTORY:  Family History  Problem Relation Age of Onset  . Malignant hyperthermia Mother   . Cancer Mother   . Malignant hyperthermia Brother   . Heart disease Brother   . Asthma Brother   . COPD Brother   . Heart disease Father   . Cancer Father   . ADD / ADHD Daughter   . ADD / ADHD Son     CURRENT MEDICATIONS:  Outpatient Encounter Medications as of 01/14/2020  Medication Sig  . albuterol (PROVENTIL HFA;VENTOLIN HFA) 108 (90 Base) MCG/ACT inhaler Inhale 2 puffs into the lungs every 6 (six) hours as needed for wheezing or shortness of breath.   . cetirizine (ZYRTEC) 10 MG tablet Take 10 mg by mouth daily as needed for allergies.   . CYCLOPHOSPHAMIDE IV Inject into the vein every 21 ( twenty-one) days.  Marland Kitchen DOXORUBICIN HCL IV Inject into the vein every 21 ( twenty-one) days.  . K Phos Mono-Sod Phos Di & Mono (PHOSPHA 250 NEUTRAL) 155-852-130 MG TABS Take 1 tablet by mouth 2 (two) times daily.  Marland Kitchen lidocaine-prilocaine (EMLA) cream Apply a small amount to port a cath site and cover with plastic wrap 1 hour prior to chemotherapy appointments  . naproxen (NAPROSYN) 500 MG tablet Take 1 tablet (500 mg total) by mouth 2 (two) times daily with a meal.  . nitroGLYCERIN (NITROSTAT) 0.4 MG SL tablet Place 1 tablet (0.4 mg total) under the tongue every 5 (five) minutes as needed for chest pain.  . predniSONE (DELTASONE) 20 MG tablet Take 5 tablets daily for five days, with each cycle of Chemotherapy  . prochlorperazine (COMPAZINE) 10 MG tablet Take 1 tablet (10 mg total) by mouth every 6 (six)  hours as needed for nausea or vomiting.  . riTUXimab in sodium chloride 0.9 % 250 mL Inject into the vein every 21 ( twenty-one) days.  . vinCRIStine 2 mg in sodium chloride 0.9 % 50 mL Inject 2 mg into the vein every 21 ( twenty-one) days.   No facility-administered encounter medications on file as of 01/14/2020.    ALLERGIES:  Allergies  Allergen Reactions  . Penicillins Anaphylaxis    Did it involve swelling of the face/tongue/throat, SOB, or low BP? Yes Did it involve sudden or severe rash/hives, skin peeling, or any reaction on the inside of your mouth or nose? No Did you need to seek medical attention at a hospital or doctor's office? Yes When did it last happen?Childhood allergy If all above answers are "NO",  may proceed with cephalosporin use.      PHYSICAL EXAM:  ECOG Performance status: 1  Vitals:   01/14/20 0824  BP: 121/68  Pulse: 84  Resp: 18  Temp: (!) 97.1 F (36.2 C)  SpO2: 94%   Filed Weights   01/14/20 0824  Weight: 270 lb 6.4 oz (122.7 kg)    Physical Exam Vitals reviewed.  Constitutional:      Appearance: Normal appearance.  Cardiovascular:     Rate and Rhythm: Normal rate and regular rhythm.     Heart sounds: Normal heart sounds.  Pulmonary:     Effort: Pulmonary effort is normal.     Breath sounds: Normal breath sounds.  Abdominal:     General: There is no distension.     Palpations: Abdomen is soft. There is no mass.  Musculoskeletal:        General: No swelling.  Lymphadenopathy:     Cervical: No cervical adenopathy.  Skin:    General: Skin is warm.  Neurological:     Mental Status: He is alert and oriented to person, place, and time.  Psychiatric:        Mood and Affect: Mood normal.        Behavior: Behavior normal.      LABORATORY DATA:  I have reviewed the labs as listed.  CBC    Component Value Date/Time   WBC 7.0 01/14/2020 0758   RBC 3.72 (L) 01/14/2020 0758   HGB 13.0 01/14/2020 0758   HCT 39.0 01/14/2020  0758   PLT 444 (H) 01/14/2020 0758   MCV 104.8 (H) 01/14/2020 0758   MCH 34.9 (H) 01/14/2020 0758   MCHC 33.3 01/14/2020 0758   RDW 18.3 (H) 01/14/2020 0758   LYMPHSABS 1.4 01/14/2020 0758   MONOABS 0.6 01/14/2020 0758   EOSABS 0.0 01/14/2020 0758   BASOSABS 0.1 01/14/2020 0758   CMP Latest Ref Rng & Units 01/14/2020 12/24/2019 12/03/2019  Glucose 70 - 99 mg/dL 120(H) 120(H) 111(H)  BUN 6 - 20 mg/dL 9 9 8   Creatinine 0.61 - 1.24 mg/dL 0.79 0.82 0.76  Sodium 135 - 145 mmol/L 141 138 139  Potassium 3.5 - 5.1 mmol/L 3.7 3.5 3.8  Chloride 98 - 111 mmol/L 110 108 107  CO2 22 - 32 mmol/L 24 23 24   Calcium 8.9 - 10.3 mg/dL 8.9 8.7(L) 8.7(L)  Total Protein 6.5 - 8.1 g/dL 6.0(L) 6.5 6.6  Total Bilirubin 0.3 - 1.2 mg/dL 0.5 0.4 0.5  Alkaline Phos 38 - 126 U/L 81 84 91  AST 15 - 41 U/L 18 22 27   ALT 0 - 44 U/L 27 29 42       DIAGNOSTIC IMAGING:  I have reviewed the scans.   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  NLP Hodgkin's disease: -4 cycles of R-CHOP from 10/23/2019 through 12/24/2019. -PET scan on 12/19/2019 reviewed by me shows bulky lymph nodes in the chest, abdomen and pelvis with diminished metabolic activity in size, with Deauville 3 response. -I have reviewed his labs.  White count, platelets and LFTs are normal. -He will proceed with cycle 5 of chemotherapy today.  We will reevaluate him in 3 weeks.  Vincristine will be dose reduced to 1 mg.  2.  Peripheral neuropathy: -He has numbness in the fingertips which is constant.  He has not dropped any objects in the last 3 weeks. -We have cut back on vincristine to 1 mg flat  dose during cycle 3 and 4.  3.  Hypophosphatemia: -He will continue Neutra-Phos 1 tablet twice daily.  4.  Tumor lysis prophylaxis: -Uric acid is normal at 4.8.  Potassium and magnesium also normal.    Orders placed this encounter:  No orders of the defined types were placed in  this encounter.     Derek Jack, MD [place] [phone]

## 2020-01-14 NOTE — Progress Notes (Signed)
Patient presents today for treatment and follow up visit with Dr. Delton Coombes. Labs pending. Vital signs within parameters for treatment. Patient has no complaints of any changes since his last visit. Patient denies any pain today. Patient states numbness in his hands is normal and no increased numbness at this time.   Labs within parameters for treatment today.   Message received from [place] / Dr. Delton Coombes to proceed with treatment. Labs reviewed by MD.   Treatment given today per MD orders. Tolerated infusion without adverse affects. Vital signs stable. No complaints at this time. Discharged from clinic ambulatory. F/U with [facility] as scheduled.

## 2020-01-14 NOTE — Assessment & Plan Note (Addendum)
1.  NLP Hodgkin's disease: -4 cycles of R-CHOP from 10/23/2019 through 12/24/2019. -PET scan on 12/19/2019 reviewed by me shows bulky lymph nodes in the chest, abdomen and pelvis with diminished metabolic activity in size, with Deauville 3 response. -I have reviewed his labs.  White count, platelets and LFTs are normal. -He will proceed with cycle 5 of chemotherapy today.  We will reevaluate him in 3 weeks.  Vincristine will be dose reduced to 1 mg.  2.  Peripheral neuropathy: -He has numbness in the fingertips which is constant.  He has not dropped any objects in the last 3 weeks. -We have cut back on vincristine to 1 mg flat dose during cycle 3 and 4.  3.  Hypophosphatemia: -He will continue Neutra-Phos 1 tablet twice daily.  4.  Tumor lysis prophylaxis: -Uric acid is normal at 4.8.  Potassium and magnesium also normal.

## 2020-01-14 NOTE — Patient Instructions (Addendum)
[facility] at [facility] Discharge Instructions  You were seen today by Dr. Katragadda. He went over your recent lab results. He will see you back in 3 weeks for labs, treatment and follow up.   Thank you for choosing [facility] at [facility] to provide your oncology and hematology care.  To afford each patient quality time with our provider, please arrive at least 15 minutes before your scheduled appointment time.   If you have a lab appointment with the Cancer Center please come in thru the  Main Entrance and check in at the main information desk  You need to re-schedule your appointment should you arrive 10 or more minutes late.  We strive to give you quality time with our providers, and arriving late affects you and other patients whose appointments are after yours.  Also, if you no show three or more times for appointments you may be dismissed from the clinic at the providers discretion.     Again, thank you for choosing [facility].  Our hope is that these requests will decrease the amount of time that you wait before being seen by our physicians.       _____________________________________________________________  Should you have questions after your visit to [facility], please contact our office at [phone] between the hours of 8:00 a.m. and 4:30 p.m.  Voicemails left after 4:00 p.m. will not be returned until the following business day.  For prescription refill requests, have your pharmacy contact our office and allow 72 hours.    Cancer Center Support Programs:   > Cancer Support Group  2nd Tuesday of the month 1pm-2pm, Journey Room    

## 2020-01-14 NOTE — Progress Notes (Signed)
Patient has been assessed, vital signs and labs have been reviewed by Dr. Katragadda. ANC, Creatinine, LFTs, and Platelets are within treatment parameters per Dr. Katragadda. The patient is good to proceed with treatment at this time.  

## 2020-01-14 NOTE — Patient Instructions (Signed)
[facility] at [facility] Discharge Instructions  Labs drawn from portacath today   Thank you for choosing [facility] at [facility] to provide your oncology and hematology care.  To afford each patient quality time with our provider, please arrive at least 15 minutes before your scheduled appointment time.   If you have a lab appointment with the Cancer Center please come in thru the Main Entrance and check in at the main information desk.  You need to re-schedule your appointment should you arrive 10 or more minutes late.  We strive to give you quality time with our providers, and arriving late affects you and other patients whose appointments are after yours.  Also, if you no show three or more times for appointments you may be dismissed from the clinic at the providers discretion.     Again, thank you for choosing [facility].  Our hope is that these requests will decrease the amount of time that you wait before being seen by our physicians.       _____________________________________________________________  Should you have questions after your visit to [facility], please contact our office at [phone] between the hours of 8:00 a.m. and 4:30 p.m.  Voicemails left after 4:00 p.m. will not be returned until the following business day.  For prescription refill requests, have your pharmacy contact our office and allow 72 hours.    Due to Covid, you will need to wear a mask upon entering the hospital. If you do not have a mask, a mask will be given to you at the Main Entrance upon arrival. For doctor visits, patients may have 1 support person with them. For treatment visits, patients can not have anyone with them due to social distancing guidelines and our immunocompromised population.     

## 2020-01-16 ENCOUNTER — Other Ambulatory Visit: Payer: Self-pay

## 2020-01-16 ENCOUNTER — Encounter (HOSPITAL_COMMUNITY): Payer: Self-pay

## 2020-01-16 ENCOUNTER — Inpatient Hospital Stay (HOSPITAL_COMMUNITY): Payer: Medicaid Other | Attending: Hematology

## 2020-01-16 VITALS — BP 127/74 | HR 96 | Temp 96.9°F | Resp 18

## 2020-01-16 DIAGNOSIS — C8108 Nodular lymphocyte predominant Hodgkin lymphoma, lymph nodes of multiple sites: Secondary | ICD-10-CM | POA: Diagnosis present

## 2020-01-16 DIAGNOSIS — Z5112 Encounter for antineoplastic immunotherapy: Secondary | ICD-10-CM | POA: Diagnosis present

## 2020-01-16 DIAGNOSIS — B370 Candidal stomatitis: Secondary | ICD-10-CM | POA: Insufficient documentation

## 2020-01-16 DIAGNOSIS — Z5111 Encounter for antineoplastic chemotherapy: Secondary | ICD-10-CM | POA: Diagnosis present

## 2020-01-16 DIAGNOSIS — G629 Polyneuropathy, unspecified: Secondary | ICD-10-CM | POA: Diagnosis not present

## 2020-01-16 DIAGNOSIS — Z5189 Encounter for other specified aftercare: Secondary | ICD-10-CM | POA: Insufficient documentation

## 2020-01-16 MED ORDER — PEGFILGRASTIM-CBQV 6 MG/0.6ML ~~LOC~~ SOSY
PREFILLED_SYRINGE | SUBCUTANEOUS | Status: AC
  Filled 2020-01-16: qty 0.6

## 2020-01-16 MED ORDER — PEGFILGRASTIM-CBQV 6 MG/0.6ML ~~LOC~~ SOSY
6.0000 mg | PREFILLED_SYRINGE | Freq: Once | SUBCUTANEOUS | Status: AC
  Administered 2020-01-16: 6 mg via SUBCUTANEOUS

## 2020-01-16 NOTE — Progress Notes (Signed)
Trinna Balloon Schumacher tolerated Udenyca injection well without complaints or incident. VSS Pt discharged self ambulatory in satisfactory condition

## 2020-01-16 NOTE — Patient Instructions (Signed)
[facility] at [facility] Discharge Instructions  Received Udenyca injection today. Follow-up as scheduled. Call clinic for any questions or concerns   Thank you for choosing [facility] at [facility] to provide your oncology and hematology care.  To afford each patient quality time with our provider, please arrive at least 15 minutes before your scheduled appointment time.   If you have a lab appointment with the Cancer Center please come in thru the Main Entrance and check in at the main information desk.  You need to re-schedule your appointment should you arrive 10 or more minutes late.  We strive to give you quality time with our providers, and arriving late affects you and other patients whose appointments are after yours.  Also, if you no show three or more times for appointments you may be dismissed from the clinic at the providers discretion.     Again, thank you for choosing [facility].  Our hope is that these requests will decrease the amount of time that you wait before being seen by our physicians.       _____________________________________________________________  Should you have questions after your visit to  Cancer Center, please contact our office at [phone] between the hours of 8:00 a.m. and 4:30 p.m.  Voicemails left after 4:00 p.m. will not be returned until the following business day.  For prescription refill requests, have your pharmacy contact our office and allow 72 hours.    Due to Covid, you will need to wear a mask upon entering the hospital. If you do not have a mask, a mask will be given to you at the Main Entrance upon arrival. For doctor visits, patients may have 1 support person with them. For treatment visits, patients can not have anyone with them due to social distancing guidelines and our immunocompromised population.     

## 2020-01-24 ENCOUNTER — Emergency Department (HOSPITAL_COMMUNITY)
Admission: EM | Admit: 2020-01-24 | Discharge: 2020-01-24 | Disposition: A | Discharge status: Home / Self Care | Payer: Medicaid Other | Attending: Emergency Medicine | Admitting: Emergency Medicine

## 2020-01-24 ENCOUNTER — Encounter (HOSPITAL_COMMUNITY): Payer: Self-pay

## 2020-01-24 ENCOUNTER — Other Ambulatory Visit: Payer: Self-pay

## 2020-01-24 DIAGNOSIS — I2510 Atherosclerotic heart disease of native coronary artery without angina pectoris: Secondary | ICD-10-CM | POA: Diagnosis not present

## 2020-01-24 DIAGNOSIS — Z79899 Other long term (current) drug therapy: Secondary | ICD-10-CM | POA: Diagnosis not present

## 2020-01-24 DIAGNOSIS — I252 Old myocardial infarction: Secondary | ICD-10-CM | POA: Insufficient documentation

## 2020-01-24 DIAGNOSIS — C8190 Hodgkin lymphoma, unspecified, unspecified site: Secondary | ICD-10-CM | POA: Diagnosis not present

## 2020-01-24 DIAGNOSIS — F17210 Nicotine dependence, cigarettes, uncomplicated: Secondary | ICD-10-CM | POA: Insufficient documentation

## 2020-01-24 DIAGNOSIS — I10 Essential (primary) hypertension: Secondary | ICD-10-CM | POA: Diagnosis not present

## 2020-01-24 DIAGNOSIS — T451X5A Adverse effect of antineoplastic and immunosuppressive drugs, initial encounter: Secondary | ICD-10-CM | POA: Insufficient documentation

## 2020-01-24 DIAGNOSIS — D701 Agranulocytosis secondary to cancer chemotherapy: Secondary | ICD-10-CM | POA: Diagnosis not present

## 2020-01-24 DIAGNOSIS — B370 Candidal stomatitis: Secondary | ICD-10-CM | POA: Diagnosis not present

## 2020-01-24 DIAGNOSIS — Z9221 Personal history of antineoplastic chemotherapy: Secondary | ICD-10-CM | POA: Diagnosis not present

## 2020-01-24 DIAGNOSIS — K140 Glossitis: Secondary | ICD-10-CM | POA: Diagnosis present

## 2020-01-24 LAB — CBC WITH DIFFERENTIAL/PLATELET
Abs Immature Granulocytes: 0.02 10*3/uL (ref 0.00–0.07)
Basophils Absolute: 0.1 10*3/uL (ref 0.0–0.1)
Basophils Relative: 2 %
Eosinophils Absolute: 0 10*3/uL (ref 0.0–0.5)
Eosinophils Relative: 0 %
HCT: 35.9 % — ABNORMAL LOW (ref 39.0–52.0)
Hemoglobin: 11.9 g/dL — ABNORMAL LOW (ref 13.0–17.0)
Immature Granulocytes: 1 %
Lymphocytes Relative: 68 %
Lymphs Abs: 1.6 10*3/uL (ref 0.7–4.0)
MCH: 35.4 pg — ABNORMAL HIGH (ref 26.0–34.0)
MCHC: 33.1 g/dL (ref 30.0–36.0)
MCV: 106.8 fL — ABNORMAL HIGH (ref 80.0–100.0)
Monocytes Absolute: 0.3 10*3/uL (ref 0.1–1.0)
Monocytes Relative: 11 %
Neutro Abs: 0.4 10*3/uL — ABNORMAL LOW (ref 1.7–7.7)
Neutrophils Relative %: 18 %
Platelets: 85 10*3/uL — ABNORMAL LOW (ref 150–400)
RBC: 3.36 MIL/uL — ABNORMAL LOW (ref 4.22–5.81)
RDW: 15.2 % (ref 11.5–15.5)
WBC: 2.3 10*3/uL — ABNORMAL LOW (ref 4.0–10.5)
nRBC: 0.9 % — ABNORMAL HIGH (ref 0.0–0.2)

## 2020-01-24 LAB — HEPATIC FUNCTION PANEL
ALT: 41 U/L (ref 0–44)
AST: 24 U/L (ref 15–41)
Albumin: 4.2 g/dL (ref 3.5–5.0)
Alkaline Phosphatase: 93 U/L (ref 38–126)
Bilirubin, Direct: 0.2 mg/dL (ref 0.0–0.2)
Indirect Bilirubin: 0.5 mg/dL (ref 0.3–0.9)
Total Bilirubin: 0.7 mg/dL (ref 0.3–1.2)
Total Protein: 7 g/dL (ref 6.5–8.1)

## 2020-01-24 LAB — URIC ACID: Uric Acid, Serum: 4.8 mg/dL (ref 3.7–8.6)

## 2020-01-24 LAB — BASIC METABOLIC PANEL
Anion gap: 7 (ref 5–15)
BUN: 9 mg/dL (ref 6–20)
CO2: 24 mmol/L (ref 22–32)
Calcium: 9 mg/dL (ref 8.9–10.3)
Chloride: 105 mmol/L (ref 98–111)
Creatinine, Ser: 0.77 mg/dL (ref 0.61–1.24)
GFR calc Af Amer: >60 mL/min (ref >60)
GFR calc non Af Amer: >60 mL/min (ref >60)
Glucose, Bld: 111 mg/dL — ABNORMAL HIGH (ref 70–99)
Potassium: 3.7 mmol/L (ref 3.5–5.1)
Sodium: 136 mmol/L (ref 135–145)

## 2020-01-24 LAB — MAGNESIUM: Magnesium: 2.1 mg/dL (ref 1.7–2.4)

## 2020-01-24 LAB — GROUP A STREP BY PCR: Group A Strep by PCR: NOT DETECTED

## 2020-01-24 MED ORDER — FLUCONAZOLE 100 MG PO TABS
200.0000 mg | ORAL_TABLET | Freq: Once | ORAL | Status: AC
  Administered 2020-01-24: 04:00:00 200 mg via ORAL
  Filled 2020-01-24: qty 2

## 2020-01-24 MED ORDER — LIDOCAINE VISCOUS HCL 2 % MT SOLN
15.0000 mL | Freq: Once | OROMUCOSAL | Status: AC
  Administered 2020-01-24: 15 mL via ORAL
  Filled 2020-01-24: qty 15

## 2020-01-24 MED ORDER — ALUM & MAG HYDROXIDE-SIMETH 200-200-20 MG/5ML PO SUSP
30.0000 mL | Freq: Once | ORAL | Status: AC
  Administered 2020-01-24: 30 mL via ORAL
  Filled 2020-01-24: qty 30

## 2020-01-24 MED ORDER — CLINDAMYCIN HCL 300 MG PO CAPS: 300.0000 mg | ORAL_CAPSULE | Freq: Three times a day (TID) | ORAL | 0 refills | Status: DC

## 2020-01-24 MED ORDER — CLINDAMYCIN HCL 150 MG PO CAPS
300.0000 mg | ORAL_CAPSULE | Freq: Once | ORAL | Status: AC
  Administered 2020-01-24: 300 mg via ORAL
  Filled 2020-01-24: qty 2

## 2020-01-24 MED ORDER — NYSTATIN 100000 UNIT/ML MT SUSP: 500000.0000 [IU] | Freq: Four times a day (QID) | OROMUCOSAL | 0 refills | Status: AC

## 2020-01-24 MED ORDER — FLUCONAZOLE 100 MG PO TABS: 100.0000 mg | ORAL_TABLET | Freq: Every day | ORAL | 0 refills | Status: DC

## 2020-01-24 NOTE — ED Notes (Signed)
Patient tolerated fluid challenge

## 2020-01-24 NOTE — ED Provider Notes (Addendum)
[facility] EMERGENCY DEPARTMENT Provider Note   CSN: [id number] Arrival date & time: 01/24/20  0135     History Chief Complaint  Patient presents with  . Tongue/Mouth Sore    Nathan Waters is a 39 y.o. male.  Patient with history of Hodgkin's lymphoma currently receiving chemotherapy every 3 weeks.  Last treatment was on March 30.  He is here with a 2-day history of pain to the side of his tongue.  He believes that his sharp teeth bit his tongue.  He states he "filed down some teeth with a Dremel because they were sharp".  He complains of pain to both sides of his tongue and pain with swallowing.  He admits most of his teeth are in poor repair but they do not hurt him at this time.  He denies any fevers, chills, nausea or vomiting.  No chest pain or shortness of breath.  Has had a cough productive of clear mucus.  He has not had this problem before with his chemotherapy. He has pain with swallowing but no pain with breathing.  He feels like his tongue is enlarged and swollen and feels like there are razors in his throat.  The history is provided by the patient.       Past Medical History:  Diagnosis Date  . Asthma   . Coronary atherosclerosis of native coronary artery    BMS to mid circumflex 11/2011, LVEF 55-60%  . Essential hypertension, benign   . Migraine   . Mixed hyperlipidemia   . STEMI (ST elevation myocardial infarction) ([place])    Complicated by VF arrest 11/2011    Patient Active Problem List   Diagnosis Date Noted  . 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 Hodgkin lymphoma ([place]) 09/18/2019  . Calculus of gallbladder without cholecystitis without obstruction   . Generalized lymphadenopathy   . Lymphadenopathy 06/27/2019  . Cholecystitis 06/15/2019  . Tobacco use 04/09/2012  . Coronary atherosclerosis of native coronary artery 01/06/2012  . Mixed hyperlipidemia 01/06/2012  . Asthma 11/21/2011     Past Surgical History:  Procedure Laterality Date  . AXILLARY LYMPH NODE BIOPSY Left 07/26/2019   Procedure: AXILLARY LYMPH NODE BIOPSY;  Surgeon: Aviva Signs, MD;  Location: [facility] ORS;  Service: General;  Laterality: Left;  . BACK SURGERY    . CARDIAC CATHETERIZATION    . CHOLECYSTECTOMY N/A 08/26/2019   Procedure: LAPAROSCOPIC CHOLECYSTECTOMY;  Surgeon: Aviva Signs, MD;  Location: [facility] ORS;  Service: General;  Laterality: N/A;  . LEFT HEART CATHETERIZATION WITH CORONARY ANGIOGRAM N/A 11/21/2011   Procedure: LEFT HEART CATHETERIZATION WITH CORONARY ANGIOGRAM;  Surgeon: Peter M Martinique, MD;  Location: [facility] CATH LAB;  Service: Cardiovascular;  Laterality: N/A;  . LYMPH NODE BIOPSY Right 08/26/2019   Procedure: LYMPH NODE BIOPSY, INGUINAL;  Surgeon: Aviva Signs, MD;  Location: [facility]S;  Service: General;  Laterality: Right;  . PERCUTANEOUS CORONARY STENT INTERVENTION (PCI-S) N/A 11/21/2011   Procedure: PERCUTANEOUS CORONARY STENT INTERVENTION (PCI-S);  Surgeon: Peter M Martinique, MD;  Location: [facility] CATH LAB;  Service: Cardiovascular;  Laterality: N/A;  . PORTACATH PLACEMENT Left 10/01/2019   Procedure: INSERTION PORT-A-CATH (catheter attached left subclavian);  Surgeon: Aviva Signs, MD;  Location: [facility]S;  Service: General;  Laterality: Left;  . SPLENECTOMY         Family History  Problem Relation Age of Onset  . Malignant hyperthermia Mother   . Cancer Mother   . Malignant hyperthermia Brother   .  Heart disease Brother   . Asthma Brother   . COPD Brother   . Heart disease Father   . Cancer Father   . ADD / ADHD Daughter   . ADD / ADHD Son     Social History   Tobacco Use  . Smoking status: Current Every Day Smoker    Packs/day: 1.00    Types: Cigarettes  . Smokeless tobacco: Never Used  Substance Use Topics  . Alcohol use: Yes    Comment: Occasionally  . Drug use: No    Home Medications Prior to Admission medications   Medication Sig Start Date End Date Taking? Authorizing  Provider  albuterol (PROVENTIL HFA;VENTOLIN HFA) 108 (90 Base) MCG/ACT inhaler Inhale 2 puffs into the lungs every 6 (six) hours as needed for wheezing or shortness of breath.     [provider]  cetirizine (ZYRTEC) 10 MG tablet Take 10 mg by mouth daily as needed for allergies.     [provider]  CYCLOPHOSPHAMIDE IV Inject into the vein every 21 ( twenty-one) days. 10/21/19   [provider]  DOXORUBICIN HCL IV Inject into the vein every 21 ( twenty-one) days. 10/21/19   [provider]  K Phos Mono-Sod Phos Di & Mono (PHOSPHA 250 NEUTRAL) [id number] MG TABS Take 1 tablet by mouth 2 (two) times daily. 11/12/19   Derek Jack, MD  lidocaine-prilocaine (EMLA) cream Apply a small amount to port a cath site and cover with plastic wrap 1 hour prior to chemotherapy appointments 10/14/19   Derek Jack, MD  naproxen (NAPROSYN) 500 MG tablet Take 1 tablet (500 mg total) by mouth 2 (two) times daily with a meal. 10/31/19   Derek Jack, MD  nitroGLYCERIN (NITROSTAT) 0.4 MG SL tablet Place 1 tablet (0.4 mg total) under the tongue every 5 (five) minutes as needed for chest pain. 04/27/17   Satira Sark, MD  predniSONE (DELTASONE) 20 MG tablet Take 5 tablets daily for five days, with each cycle of Chemotherapy 10/31/19   Derek Jack, MD  prochlorperazine (COMPAZINE) 10 MG tablet Take 1 tablet (10 mg total) by mouth every 6 (six) hours as needed for nausea or vomiting. 10/14/19   Derek Jack, MD  riTUXimab in sodium chloride 0.9 % 250 mL Inject into the vein every 21 ( twenty-one) days. 10/21/19   [provider]  vinCRIStine 2 mg in sodium chloride 0.9 % 50 mL Inject 2 mg into the vein every 21 ( twenty-one) days. 10/21/19   [provider]    Allergies    Penicillins  Review of Systems   Review of Systems  Constitutional: Positive for fatigue. Negative for activity change, appetite change and fever.  HENT:  Positive for dental problem, sore throat and trouble swallowing.   Respiratory: Negative for cough, chest tightness and shortness of breath.   Cardiovascular: Negative for chest pain.  Gastrointestinal: Negative for abdominal pain, nausea and vomiting.  Genitourinary: Negative for dysuria and hematuria.  Musculoskeletal: Negative for arthralgias and myalgias.  Skin: Negative for rash.  Neurological: Negative for dizziness, weakness and headaches.   all other systems are negative except as noted in the HPI and PMH.    Physical Exam Updated Vital Signs BP (!) 133/50 (BP Location: Right Arm)   Pulse 98   Temp 98.4 F (36.9 C) (Oral)   Resp 17   Ht 5\' 11"  (1.803 m)   Wt 122.5 kg   SpO2 98%   BMI 37.66 kg/m   Physical Exam Vitals  and nursing note reviewed.  Constitutional:      General: He is not in acute distress.    Appearance: He is well-developed.  HENT:     Head: Normocephalic and atraumatic.     Mouth/Throat:     Pharynx: No oropharyngeal exudate.     Comments: Poor dentition throughout, multiple missing teeth and multiple teeth broken off at the gumline.  Floor mouth is soft. No trismus or malocclusion, controlling secretions.  Erythematous to posterior oropharynx with thrush noted. Abrasions to the sides of tongue bilaterally.  Tongue and lips did not appear to be swollen. Eyes:     Conjunctiva/sclera: Conjunctivae normal.     Pupils: Pupils are equal, round, and reactive to light.  Neck:     Comments: No meningismus. Cardiovascular:     Rate and Rhythm: Normal rate and regular rhythm.     Heart sounds: Normal heart sounds. No murmur.  Pulmonary:     Effort: Pulmonary effort is normal. No respiratory distress.     Breath sounds: Normal breath sounds.  Abdominal:     Palpations: Abdomen is soft.     Tenderness: There is no abdominal tenderness. There is no guarding or rebound.  Musculoskeletal:        General: No tenderness. Normal range of motion.     Cervical  back: Normal range of motion and neck supple.  Skin:    General: Skin is warm.  Neurological:     Mental Status: He is alert and oriented to person, place, and time.     Cranial Nerves: No cranial nerve deficit.     Motor: No abnormal muscle tone.     Coordination: Coordination normal.     Comments: No ataxia on finger to nose bilaterally. No pronator drift. 5/5 strength throughout. CN 2-12 intact.Equal grip strength. Sensation intact.   Psychiatric:        Behavior: Behavior normal.     ED Results / Procedures / Treatments   Labs (all labs ordered are listed, but only abnormal results are displayed) Labs Reviewed  CBC WITH DIFFERENTIAL/PLATELET - Abnormal; Notable for the following components:      Result Value   WBC 2.3 (*)    RBC 3.36 (*)    Hemoglobin 11.9 (*)    HCT 35.9 (*)    MCV 106.8 (*)    MCH 35.4 (*)    Platelets 85 (*)    nRBC 0.9 (*)    Neutro Abs 0.4 (*)    All other components within normal limits  BASIC METABOLIC PANEL - Abnormal; Notable for the following components:   Glucose, Bld 111 (*)    All other components within normal limits  GROUP A STREP BY PCR  HEPATIC FUNCTION PANEL  URIC ACID  MAGNESIUM    EKG None  Radiology No results found.  Procedures Procedures (including critical care time)  Medications Ordered in ED Medications  alum & mag hydroxide-simeth (MAALOX/MYLANTA) 200-200-20 MG/5ML suspension 30 mL (has no administration in time range)    And  lidocaine (XYLOCAINE) 2 % viscous mouth solution 15 mL (has no administration in time range)    ED Course  I have reviewed the triage vital signs and the nursing notes.  Pertinent labs & imaging results that were available during my care of the patient were reviewed by me and considered in my medical decision making (see chart for details).    MDM Rules/Calculators/A&P  Patient with tongue and oral pain and pain with swelling for the past 2 days.  No fever.  Does  take chemotherapy.  Teeth are in poor repair throughout With no evidence of abscess or drainable fluid collection.  No evidence of Ludwig's angina.  Concern for oral candidiasis.  NLP Hodgkin's disease: -4 cycles of R-CHOP from 10/23/2019 through 12/24/2019. -PET scan on 12/19/2019 reviewed by me shows bulky lymph nodes in the chest, abdomen and pelvis with diminished metabolic activity in size, with Deauville 3 response.  Labs today show white count 2.3 with thrombocytopenia.  ANC is 414.  Consistent with severe neutropenia  D/w Dr. Delton Coombes who knows patient well.  He agrees with treatment for oral candidiasis with p.o. Diflucan 200 mg here then 100 mg daily.  Also nystatin and will give antibiotics for dentition.  He is neutropenic but not febrile.  He appears well and nontoxic.  Tolerating PO. Will treat candidiasis with nystatin, diflucan. Antibiotics for poor dentition.  Followup with Dr. Delton Coombes.  Return precautions discussed. Final Clinical Impression(s) / ED Diagnoses Final diagnoses:  Oral candidiasis  Chemotherapy-induced neutropenia [facility])    Rx / DC Orders ED Discharge Orders    None       Atiana Levier, Annie Main, MD 01/24/20 [id number]    Ezequiel Essex, MD 01/24/20 1950

## 2020-01-24 NOTE — Discharge Instructions (Signed)
Take medications for yeast infection of your mouth as well as antibiotics for your poor teeth condition.  Follow-up with your oncologist later today.  Return to the ED with difficulty breathing, difficulty swallowing, chest pain, fever, any other concerns.

## 2020-01-24 NOTE — ED Triage Notes (Signed)
Pt states his tongue and gums have been sore for a couple of days.  Pt states he had some sharp teeth that he "filed down with a dremel" tool.   Pt is receiving chemo, last treatment was 4-5 days ago.

## 2020-01-29 NOTE — Progress Notes (Signed)
.  Pharmacist Chemotherapy Monitoring - Follow Up Assessment    I verify that I have reviewed each item in the below checklist:  . Regimen for the patient is scheduled for the appropriate day and plan matches scheduled date. Marland Kitchen Appropriate non-routine labs are ordered dependent on drug ordered. . If applicable, additional medications reviewed and ordered per protocol based on lifetime cumulative doses and/or treatment regimen.   Plan for follow-up and/or issues identified: No . I-vent associated with next due treatment: No . MD and/or nursing notified: No  Nathan Waters 01/29/2020 3:31 PM

## 2020-02-04 ENCOUNTER — Inpatient Hospital Stay (HOSPITAL_BASED_OUTPATIENT_CLINIC_OR_DEPARTMENT_OTHER): Payer: Medicaid Other | Admitting: Hematology

## 2020-02-04 ENCOUNTER — Other Ambulatory Visit: Payer: Self-pay

## 2020-02-04 ENCOUNTER — Inpatient Hospital Stay (HOSPITAL_COMMUNITY): Payer: Medicaid Other

## 2020-02-04 ENCOUNTER — Encounter (HOSPITAL_COMMUNITY): Payer: Self-pay | Admitting: Hematology

## 2020-02-04 VITALS — BP 132/76 | HR 85 | Temp 96.9°F | Resp 18 | Wt 267.8 lb

## 2020-02-04 VITALS — BP 111/71 | HR 83 | Temp 97.0°F | Resp 16

## 2020-02-04 DIAGNOSIS — C8108 Nodular lymphocyte predominant Hodgkin lymphoma, lymph nodes of multiple sites: Secondary | ICD-10-CM

## 2020-02-04 DIAGNOSIS — Z5112 Encounter for antineoplastic immunotherapy: Secondary | ICD-10-CM | POA: Diagnosis not present

## 2020-02-04 LAB — COMPREHENSIVE METABOLIC PANEL
ALT: 29 U/L (ref 0–44)
AST: 19 U/L (ref 15–41)
Albumin: 3.7 g/dL (ref 3.5–5.0)
Alkaline Phosphatase: 80 U/L (ref 38–126)
Anion gap: 6 (ref 5–15)
BUN: 10 mg/dL (ref 6–20)
CO2: 25 mmol/L (ref 22–32)
Calcium: 8.8 mg/dL — ABNORMAL LOW (ref 8.9–10.3)
Chloride: 109 mmol/L (ref 98–111)
Creatinine, Ser: 0.79 mg/dL (ref 0.61–1.24)
GFR calc Af Amer: >60 mL/min (ref >60)
GFR calc non Af Amer: >60 mL/min (ref >60)
Glucose, Bld: 141 mg/dL — ABNORMAL HIGH (ref 70–99)
Potassium: 3.5 mmol/L (ref 3.5–5.1)
Sodium: 140 mmol/L (ref 135–145)
Total Bilirubin: 0.4 mg/dL (ref 0.3–1.2)
Total Protein: 6.4 g/dL — ABNORMAL LOW (ref 6.5–8.1)

## 2020-02-04 LAB — CBC WITH DIFFERENTIAL/PLATELET
Abs Immature Granulocytes: 0.04 10*3/uL (ref 0.00–0.07)
Basophils Absolute: 0.1 10*3/uL (ref 0.0–0.1)
Basophils Relative: 1 %
Eosinophils Absolute: 0 10*3/uL (ref 0.0–0.5)
Eosinophils Relative: 0 %
HCT: 38.4 % — ABNORMAL LOW (ref 39.0–52.0)
Hemoglobin: 12.6 g/dL — ABNORMAL LOW (ref 13.0–17.0)
Immature Granulocytes: 1 %
Lymphocytes Relative: 23 %
Lymphs Abs: 1.6 10*3/uL (ref 0.7–4.0)
MCH: 35.5 pg — ABNORMAL HIGH (ref 26.0–34.0)
MCHC: 32.8 g/dL (ref 30.0–36.0)
MCV: 108.2 fL — ABNORMAL HIGH (ref 80.0–100.0)
Monocytes Absolute: 0.8 10*3/uL (ref 0.1–1.0)
Monocytes Relative: 11 %
Neutro Abs: 4.7 10*3/uL (ref 1.7–7.7)
Neutrophils Relative %: 64 %
Platelets: 447 10*3/uL — ABNORMAL HIGH (ref 150–400)
RBC: 3.55 MIL/uL — ABNORMAL LOW (ref 4.22–5.81)
RDW: 15.8 % — ABNORMAL HIGH (ref 11.5–15.5)
WBC: 7.2 10*3/uL (ref 4.0–10.5)
nRBC: 0 % (ref 0.0–0.2)

## 2020-02-04 LAB — URIC ACID: Uric Acid, Serum: 5 mg/dL (ref 3.7–8.6)

## 2020-02-04 LAB — PHOSPHORUS: Phosphorus: 2.7 mg/dL (ref 2.5–4.6)

## 2020-02-04 LAB — MAGNESIUM: Magnesium: 2 mg/dL (ref 1.7–2.4)

## 2020-02-04 MED ORDER — FAMOTIDINE IN NACL 20-0.9 MG/50ML-% IV SOLN
20.0000 mg | Freq: Once | INTRAVENOUS | Status: AC
  Administered 2020-02-04: 20 mg via INTRAVENOUS
  Filled 2020-02-04: qty 50

## 2020-02-04 MED ORDER — VINCRISTINE SULFATE CHEMO INJECTION 1 MG/ML
1.0000 mg | Freq: Once | INTRAVENOUS | Status: AC
  Administered 2020-02-04: 1 mg via INTRAVENOUS
  Filled 2020-02-04: qty 1

## 2020-02-04 MED ORDER — SODIUM CHLORIDE 0.9 % IV SOLN
150.0000 mg | Freq: Once | INTRAVENOUS | Status: AC
  Administered 2020-02-04: 150 mg via INTRAVENOUS
  Filled 2020-02-04: qty 150

## 2020-02-04 MED ORDER — SODIUM CHLORIDE 0.9% FLUSH
10.0000 mL | INTRAVENOUS | Status: DC | PRN
  Administered 2020-02-04: 10 mL

## 2020-02-04 MED ORDER — SODIUM CHLORIDE 0.9 % IV SOLN: Freq: Once | INTRAVENOUS | Status: AC

## 2020-02-04 MED ORDER — PALONOSETRON HCL INJECTION 0.25 MG/5ML
0.2500 mg | Freq: Once | INTRAVENOUS | Status: AC
  Administered 2020-02-04: 0.25 mg via INTRAVENOUS
  Filled 2020-02-04: qty 5

## 2020-02-04 MED ORDER — DOXORUBICIN HCL CHEMO IV INJECTION 2 MG/ML
50.0000 mg/m2 | Freq: Once | INTRAVENOUS | Status: AC
  Administered 2020-02-04: 124 mg via INTRAVENOUS
  Filled 2020-02-04: qty 62

## 2020-02-04 MED ORDER — SODIUM CHLORIDE 0.9 % IV SOLN
10.0000 mg | Freq: Once | INTRAVENOUS | Status: AC
  Administered 2020-02-04: 10 mg via INTRAVENOUS
  Filled 2020-02-04: qty 10

## 2020-02-04 MED ORDER — HEPARIN SOD (PORK) LOCK FLUSH 100 UNIT/ML IV SOLN
500.0000 [IU] | Freq: Once | INTRAVENOUS | Status: AC | PRN
  Administered 2020-02-04: 500 [IU]

## 2020-02-04 MED ORDER — DIPHENHYDRAMINE HCL 50 MG/ML IJ SOLN
50.0000 mg | Freq: Once | INTRAMUSCULAR | Status: AC
  Administered 2020-02-04: 50 mg via INTRAVENOUS
  Filled 2020-02-04: qty 1

## 2020-02-04 MED ORDER — SODIUM CHLORIDE 0.9 % IV SOLN
750.0000 mg/m2 | Freq: Once | INTRAVENOUS | Status: AC
  Administered 2020-02-04: 1840 mg via INTRAVENOUS
  Filled 2020-02-04: qty 92

## 2020-02-04 MED ORDER — SODIUM CHLORIDE 0.9 % IV SOLN
375.0000 mg/m2 | Freq: Once | INTRAVENOUS | Status: AC
  Administered 2020-02-04: 11:00:00 900 mg via INTRAVENOUS
  Filled 2020-02-04: qty 40

## 2020-02-04 MED ORDER — PROCHLORPERAZINE MALEATE 10 MG PO TABS: 10.0000 mg | ORAL_TABLET | Freq: Four times a day (QID) | ORAL | 0 refills | Status: DC | PRN

## 2020-02-04 MED ORDER — ACETAMINOPHEN 325 MG PO TABS
650.0000 mg | ORAL_TABLET | Freq: Once | ORAL | Status: AC
  Administered 2020-02-04: 650 mg via ORAL
  Filled 2020-02-04: qty 2

## 2020-02-04 NOTE — Assessment & Plan Note (Signed)
1.  NLP Hodgkin's disease: -5 cycles of R-CHOP from 10/23/2019 through 01/14/2020. -PET scan on 12/19/2019 shows bulky lymph nodes in the chest, abdomen and pelvis with diminished metabolic activity in size, with Deauville 3 response. -He went to the ER on 01/24/2020 with pain in the mouth.  He was treated for thrush. -Numbness in the fingertips has been stable.  I reviewed his labs.  White count is 7.2 and platelet count is 447. -LFTs are normal.  He will proceed with cycle 6 today.  I plan to repeat PET scan prior to next visit in 3 to 4 weeks.  2.  Peripheral neuropathy: -He has constant numbness in the fingertips.  He is not dropping things.  I have cut back vincristine to 1 mg flat dose.  3.  Hypophosphatemia: -Phosphate is 2.7.  He is not taking Neutra-Phos.  4.  Tumor lysis prophylaxis: -Uric acid is 5.  Potassium is 3.5 and magnesium is 2.0.

## 2020-02-04 NOTE — Progress Notes (Signed)
Patient has been assessed, vital signs and labs have been reviewed by Dr. Delton Coombes. ANC, Creatinine, LFTs, and Platelets are within treatment parameters per Dr. Delton Coombes. The patient is good to proceed with treatment at this time. Treatment dose the same as last time per Dr. Delton Coombes.

## 2020-02-04 NOTE — Patient Instructions (Signed)
[facility] Discharge Instructions for Patients Receiving Chemotherapy   Beginning January 23rd 2017 lab work for the [facility] will be done in the  Main lab at [facility] on 1st floor. If you have a lab appointment with the [facility] please come in thru the  Main Entrance and check in at the main information desk   Today you received the following chemotherapy agents Adriamycin, Vincristine, Cytoxan, Rituxan  To help prevent nausea and vomiting after your treatment, we encourage you to take your nausea medication   If you develop nausea and vomiting, or diarrhea that is not controlled by your medication, call the clinic.  The clinic phone number is [phone]. Office hours are Monday-Friday 8:30am-5:00pm.  BELOW ARE SYMPTOMS THAT SHOULD BE REPORTED IMMEDIATELY:  *FEVER GREATER THAN 101.0 F  *CHILLS WITH OR WITHOUT FEVER  NAUSEA AND VOMITING THAT IS NOT CONTROLLED WITH YOUR NAUSEA MEDICATION  *UNUSUAL SHORTNESS OF BREATH  *UNUSUAL BRUISING OR BLEEDING  TENDERNESS IN MOUTH AND THROAT WITH OR WITHOUT PRESENCE OF ULCERS  *URINARY PROBLEMS  *BOWEL PROBLEMS  UNUSUAL RASH Items with * indicate a potential emergency and should be followed up as soon as possible. If you have an emergency after office hours please contact your primary care physician or go to the nearest emergency department.  Please call the clinic during office hours if you have any questions or concerns.   You may also contact the Patient Navigator at [phone] should you have any questions or need assistance in obtaining follow up care.      Resources For Cancer Patients and their Caregivers ? American Cancer Society: Can assist with transportation, wigs, general needs, runs Look Good Feel Better.        (226)491-5024 ? Cancer Care: Provides financial assistance, online support groups, medication/co-pay assistance.  1-800-813-HOPE 431-419-0366) ? [place] Assists [place] cancer patients and their families through emotional , educational and financial support.  (867) 030-6613 ? [place] DSS Where to apply for food stamps, Medicaid and utility assistance. 445 556 7475 ? [facility]: Transportation to medical appointments. 409-566-0084 ? Social Security Administration: May apply for disability if have a Stage IV cancer. (628)059-5086 534-073-4165 ? [employer], Disability and Transit Services: Assists with nutrition, care and transit needs. (909) 585-5636

## 2020-02-04 NOTE — Progress Notes (Signed)
Nathan Waters, [place] [id number]   CLINIC:  Medical Oncology/Hematology  PCP:  Sofie Rower, PA-C [address] [place] [address] [phone]   REASON FOR VISIT:  Follow-up for lymph node biopsy results.  CURRENT THERAPY: R-CHOP.   INTERVAL HISTORY:  Nathan Waters 39 y.o. male seen for follow-up and tox adjustment prior to cycle 6 of chemotherapy.  He went to the ER on 01/24/2020 with mouth pain.  He was diagnosed with thrush and was treated with Diflucan.  Appetite is 100%.  Energy levels are 100%.  Dizziness occasionally present.  Numbness in the fingers is constant.  Denies dropping things.  Able to do all his ADLs and IADLs.  REVIEW OF SYSTEMS:  Review of Systems  Neurological: Positive for dizziness and numbness.  All other systems reviewed and are negative.    PAST MEDICAL/SURGICAL HISTORY:  Past Medical History:  Diagnosis Date  . Asthma   . Coronary atherosclerosis of native coronary artery    BMS to mid circumflex 11/2011, LVEF 55-60%  . Essential hypertension, benign   . Migraine   . Mixed hyperlipidemia   . STEMI (ST elevation myocardial infarction) ([place])    Complicated by VF arrest 11/2011   Past Surgical History:  Procedure Laterality Date  . AXILLARY LYMPH NODE BIOPSY Left 07/26/2019   Procedure: AXILLARY LYMPH NODE BIOPSY;  Surgeon: Aviva Signs, MD;  Location: [facility];  Service: General;  Laterality: Left;  . BACK SURGERY    . CARDIAC CATHETERIZATION    . CHOLECYSTECTOMY N/A 08/26/2019   Procedure: LAPAROSCOPIC CHOLECYSTECTOMY;  Surgeon: Aviva Signs, MD;  Location: [facility];  Service: General;  Laterality: N/A;  . LEFT HEART CATHETERIZATION WITH CORONARY ANGIOGRAM N/A 11/21/2011   Procedure: LEFT HEART CATHETERIZATION WITH CORONARY ANGIOGRAM;  Surgeon: Peter M Martinique, MD;  Location: [facility] C[facility] LAB;  Service: Cardiovascular;  Laterality: N/A;  . LYMPH NODE BIOPSY Right 08/26/2019   Procedure: LYMPH NODE BIOPSY,  INGUINAL;  Surgeon: Aviva Signs, MD;  Location: [facility];  Service: General;  Laterality: Right;  . PERCUTANEOUS CORONARY STENT INTERVENTION (PCI-S) N/A 11/21/2011   Procedure: PERCUTANEOUS CORONARY STENT INTERVENTION (PCI-S);  Surgeon: Peter M Martinique, MD;  Location[facility] CATH LAB;  Service: Cardiovascular;  Laterality: N/A;  . PORTACATH PLACEMENT Left 10/01/2019   Procedure: INSERTION PORT-A-CATH (catheter attached left subclavian);  Surgeon: Aviva Signs, MD;  Location: [facility];  Service: General;  Laterality: Left;  . SPLENECTOMY       SOCIAL HISTORY:  Social History   Socioeconomic History  . Marital status: Divorced    Spouse name: Not on file  . Number of children: 2  . Years of education: Not on file  . Highest education level: Not on file  Occupational History  . Occupation: not employed  Tobacco Use  . Smoking status: Current Every Day Smoker    Packs/day: 1.00    Types: Cigarettes  . Smokeless tobacco: Never Used  Substance and Sexual Activity  . Alcohol use: Yes    Comment: Occasionally  . Drug use: No  . Sexual activity: Yes    Birth control/protection: Rhythm  Other Topics Concern  . Not on file  Social History Narrative  . Not on file   Social Determinants of Health   Financial Resource Strain: Low Risk   . Difficulty of Paying Living Expenses: Not hard at all  Food Insecurity: No Food Insecurity  . Worried About Charity fundraiser in the  Last Year: Never true  . Ran Out of Food in the Last Year: Never true  Transportation Needs: No Transportation Needs  . Lack of Transportation (Medical): No  . Lack of Transportation (Non-Medical): No  Physical Activity: Insufficiently Active  . Days of Exercise per Week: 3 days  . Minutes of Exercise per Session: 30 min  Stress: No Stress Concern Present  . Feeling of Stress : Only a little  Social Connections: Moderately Isolated  . Frequency of Communication with Friends and Family: More than three times a week  .  Frequency of Social Gatherings with Friends and Family: Twice a week  . Attends Religious Services: Never  . Active Member of Clubs or Organizations: No  . Attends Archivist Meetings: Not asked  . Marital Status: Divorced  Human resources officer Violence: Not At Risk  . Fear of Current or Ex-Partner: No  . Emotionally Abused: No  . Physically Abused: No  . Sexually Abused: No    FAMILY HISTORY:  Family History  Problem Relation Age of Onset  . Malignant hyperthermia Mother   . Cancer Mother   . Malignant hyperthermia Brother   . Heart disease Brother   . Asthma Brother   . COPD Brother   . Heart disease Father   . Cancer Father   . ADD / ADHD Daughter   . ADD / ADHD Son     CURRENT MEDICATIONS:  Outpatient Encounter Medications as of 02/04/2020  Medication Sig  . clindamycin (CLEOCIN) 300 MG capsule Take 1 capsule (300 mg total) by mouth 3 (three) times daily.  . CYCLOPHOSPHAMIDE IV Inject into the vein every 21 ( twenty-one) days.  Marland Kitchen DOXORUBICIN HCL IV Inject into the vein every 21 ( twenty-one) days.  . K Phos Mono-Sod Phos Di & Mono (PHOSPHA 250 NEUTRAL) [id number] MG TABS Take 1 tablet by mouth 2 (two) times daily.  . riTUXimab in sodium chloride 0.9 % 250 mL Inject into the vein every 21 ( twenty-one) days.  . vinCRIStine 2 mg in sodium chloride 0.9 % 50 mL Inject 2 mg into the vein every 21 ( twenty-one) days.  Marland Kitchen albuterol (PROVENTIL HFA;VENTOLIN HFA) 108 (90 Base) MCG/ACT inhaler Inhale 2 puffs into the lungs every 6 (six) hours as needed for wheezing or shortness of breath.   . cetirizine (ZYRTEC) 10 MG tablet Take 10 mg by mouth daily as needed for allergies.   Marland Kitchen lidocaine-prilocaine (EMLA) cream Apply a small amount to port a cath site and cover with plastic wrap 1 hour prior to chemotherapy appointments (Patient not taking: Reported on 02/04/2020)  . naproxen (NAPROSYN) 500 MG tablet Take 1 tablet (500 mg total) by mouth 2 (two) times daily with a meal. (Patient  not taking: Reported on 02/04/2020)  . nitroGLYCERIN (NITROSTAT) 0.4 MG SL tablet Place 1 tablet (0.4 mg total) under the tongue every 5 (five) minutes as needed for chest pain. (Patient not taking: Reported on 02/04/2020)  . predniSONE (DELTASONE) 20 MG tablet Take 5 tablets daily for five days, with each cycle of Chemotherapy (Patient not taking: Reported on 02/04/2020)  . prochlorperazine (COMPAZINE) 10 MG tablet Take 1 tablet (10 mg total) by mouth every 6 (six) hours as needed for nausea or vomiting.  . [DISCONTINUED] prochlorperazine (COMPAZINE) 10 MG tablet Take 1 tablet (10 mg total) by mouth every 6 (six) hours as needed for nausea or vomiting. (Patient not taking: Reported on 02/04/2020)   No facility-administered encounter medications on file as of 02/04/2020.  ALLERGIES:  Allergies  Allergen Reactions  . Penicillins Anaphylaxis    Did it involve swelling of the face/tongue/throat, SOB, or low BP? Yes Did it involve sudden or severe rash/hives, skin peeling, or any reaction on the inside of your mouth or nose? No Did you need to seek medical attention at a hospital or doctor's office? Yes When did it last happen?Childhood allergy If all above answers are "NO", may proceed with cephalosporin use.      PHYSICAL EXAM:  ECOG Performance status: 1  Vitals:   02/04/20 0757  BP: 132/76  Pulse: 85  Resp: 18  Temp: (!) 96.9 F (36.1 C)  SpO2: 99%   Filed Weights   02/04/20 0757  Weight: 267 lb 12.8 oz (121.5 kg)    Physical Exam Vitals reviewed.  Constitutional:      Appearance: Normal appearance.  Cardiovascular:     Rate and Rhythm: Normal rate and regular rhythm.     Heart sounds: Normal heart sounds.  Pulmonary:     Effort: Pulmonary effort is normal.     Breath sounds: Normal breath sounds.  Abdominal:     General: There is no distension.     Palpations: Abdomen is soft. There is no mass.  Musculoskeletal:        General: No swelling.  Lymphadenopathy:       Cervical: No cervical adenopathy.  Skin:    General: Skin is warm.  Neurological:     Mental Status: He is alert and oriented to person, place, and time.  Psychiatric:        Mood and Affect: Mood normal.        Behavior: Behavior normal.      LABORATORY DATA:  I have reviewed the labs as listed.  CBC    Component Value Date/Time   WBC 7.2 02/04/2020 0813   RBC 3.55 (L) 02/04/2020 0813   HGB 12.6 (L) 02/04/2020 0813   HCT 38.4 (L) 02/04/2020 0813   PLT 447 (H) 02/04/2020 0813   MCV 108.2 (H) 02/04/2020 0813   MCH 35.5 (H) 02/04/2020 0813   MCHC 32.8 02/04/2020 0813   RDW 15.8 (H) 02/04/2020 0813   LYMPHSABS 1.6 02/04/2020 0813   MONOABS 0.8 02/04/2020 0813   EOSABS 0.0 02/04/2020 0813   BASOSABS 0.1 02/04/2020 0813   CMP Latest Ref Rng & Units 02/04/2020 01/24/2020 01/14/2020  Glucose 70 - 99 mg/dL 141(H) 111(H) 120(H)  BUN 6 - 20 mg/dL 10 9 9   Creatinine 0.61 - 1.24 mg/dL 0.79 0.77 0.79  Sodium 135 - 145 mmol/L 140 136 141  Potassium 3.5 - 5.1 mmol/L 3.5 3.7 3.7  Chloride 98 - 111 mmol/L 109 105 110  CO2 22 - 32 mmol/L 25 24 24   Calcium 8.9 - 10.3 mg/dL 8.8(L) 9.0 8.9  Total Protein 6.5 - 8.1 g/dL 6.4(L) 7.0 6.0(L)  Total Bilirubin 0.3 - 1.2 mg/dL 0.4 0.7 0.5  Alkaline Phos 38 - 126 U/L 80 93 81  AST 15 - 41 U/L 19 24 18   ALT 0 - 44 U/L 29 41 27       DIAGNOSTIC IMAGING:  I have reviewed scans.   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  NLP Hodgkin's disease: -5 cycles of R-CHOP from 10/23/2019 through 01/14/2020. -PET scan on 12/19/2019 shows bulky lymph nodes in the chest, abdomen and pelvis with diminished metabolic activity in size, with De[place] 3 response. -He went to the  ER on 01/24/2020 with pain in the mouth.  He was treated for thrush. -Numbness in the fingertips has been stable.  I reviewed his labs.  White count is 7.2 and platelet count is 447. -LFTs are normal.  He will  proceed with cycle 6 today.  I plan to repeat PET scan prior to next visit in 3 to 4 weeks.  2.  Peripheral neuropathy: -He has constant numbness in the fingertips.  He is not dropping things.  I have cut back vincristine to 1 mg flat dose.  3.  Hypophosphatemia: -Phosphate is 2.7.  He is not taking Neutra-Phos.  4.  Tumor lysis prophylaxis: -Uric acid is 5.  Potassium is 3.5 and magnesium is 2.0.    Orders placed this encounter:  Orders Placed This Encounter  Procedures  . NM PET Image Restag (PS) Skull Base To Thigh      Derek Jack, MD [place] [phone]

## 2020-02-04 NOTE — Progress Notes (Signed)
Nathan Waters presents today for D1C6 RCHOP. Pt denies complaints or new symptoms since last treatment. He states he is more fatigued for about 2 weeks after treatment but it improves. Vitals signs and lab results reviewed and patient assessed by Dr. Delton Coombes. Per MD and parameters, ok to proceed with cycle 6 today.  Treatment tolerated without incident or complaint. VSS throughout. Port flushed and deaccessed. Discharged in satisfactory condition with follow up instructions.

## 2020-02-04 NOTE — Patient Instructions (Addendum)
[place] at [facility] Discharge Instructions  You were seen today by Dr. Delton Coombes. He went over your recent results. He will repeat your PET scan prior to your next visit.  He will see you back in 4 weeks for labs and follow up.   Thank you for choosing [place] at [facility] to provide your oncology and hematology care.  To afford each patient quality time with our provider, please arrive at least 15 minutes before your scheduled appointment time.   If you have a lab appointment with the [facility] please come in thru the  Main Entrance and check in at the main information desk  You need to re-schedule your appointment should you arrive 10 or more minutes late.  We strive to give you quality time with our providers, and arriving late affects you and other patients whose appointments are after yours.  Also, if you no show three or more times for appointments you may be dismissed from the clinic at the providers discretion.     Again, thank you for choosing [facility].  Our hope is that these requests will decrease the amount of time that you wait before being seen by our physicians.       _____________________________________________________________  Should you have questions after your visit to [facility], please contact our office at [phone] between the hours of 8:00 a.m. and 4:30 p.m.  Voicemails left after 4:00 p.m. will not be returned until the following business day.  For prescription refill requests, have your pharmacy contact our office and allow 72 hours.    Cancer Center Support Programs:   > Cancer Support Group  2nd Tuesday of the month 1pm-2pm, Journey Room

## 2020-02-06 ENCOUNTER — Encounter (HOSPITAL_COMMUNITY): Payer: Self-pay

## 2020-02-06 ENCOUNTER — Inpatient Hospital Stay (HOSPITAL_COMMUNITY): Payer: Medicaid Other

## 2020-02-06 ENCOUNTER — Other Ambulatory Visit: Payer: Self-pay

## 2020-02-06 VITALS — BP 134/81 | HR 94 | Temp 96.6°F | Resp 18

## 2020-02-06 DIAGNOSIS — C8190 Hodgkin lymphoma, unspecified, unspecified site: Secondary | ICD-10-CM

## 2020-02-06 DIAGNOSIS — C8108 Nodular lymphocyte predominant Hodgkin lymphoma, lymph nodes of multiple sites: Secondary | ICD-10-CM

## 2020-02-06 DIAGNOSIS — Z5112 Encounter for antineoplastic immunotherapy: Secondary | ICD-10-CM | POA: Diagnosis not present

## 2020-02-06 MED ORDER — PEGFILGRASTIM-CBQV 6 MG/0.6ML ~~LOC~~ SOSY
6.0000 mg | PREFILLED_SYRINGE | Freq: Once | SUBCUTANEOUS | Status: AC
  Administered 2020-02-06: 6 mg via SUBCUTANEOUS

## 2020-02-06 NOTE — Progress Notes (Signed)
Nathan Waters tolerated Udenyca injection well without complaints or incident. VSS Pt discharged self ambulatory in satisfactory condition

## 2020-02-06 NOTE — Patient Instructions (Signed)
[facility] at [facility] Discharge Instructions  Received Udenyca injection today. Follow-up as scheduled. Call clinic for any questions or concerns   Thank you for choosing [facility] at [facility] to provide your oncology and hematology care.  To afford each patient quality time with our provider, please arrive at least 15 minutes before your scheduled appointment time.   If you have a lab appointment with the Cancer Center please come in thru the Main Entrance and check in at the main information desk.  You need to re-schedule your appointment should you arrive 10 or more minutes late.  We strive to give you quality time with our providers, and arriving late affects you and other patients whose appointments are after yours.  Also, if you no show three or more times for appointments you may be dismissed from the clinic at the providers discretion.     Again, thank you for choosing [facility].  Our hope is that these requests will decrease the amount of time that you wait before being seen by our physicians.       _____________________________________________________________  Should you have questions after your visit to [facility], please contact our office at [phone] between the hours of 8:00 a.m. and 4:30 p.m.  Voicemails left after 4:00 p.m. will not be returned until the following business day.  For prescription refill requests, have your pharmacy contact our office and allow 72 hours.    Due to Covid, you will need to wear a mask upon entering the hospital. If you do not have a mask, a mask will be given to you at the Main Entrance upon arrival. For doctor visits, patients may have 1 support person with them. For treatment visits, patients can not have anyone with them due to social distancing guidelines and our immunocompromised population.     

## 2020-02-11 ENCOUNTER — Other Ambulatory Visit (HOSPITAL_COMMUNITY): Payer: Self-pay | Admitting: *Deleted

## 2020-02-11 MED ORDER — FLUCONAZOLE 100 MG PO TABS: 100.0000 mg | ORAL_TABLET | Freq: Every day | ORAL | 0 refills | Status: AC

## 2020-02-11 NOTE — Telephone Encounter (Signed)
Patient called clinic today reporting thrush in his mouth and burning when he swallows.  Refill for diflucan sent to pharmacy.  Advised patient to continue to use his nystatin mouth rinse swish and gargle as prescribed.  He verbalizes understanding.

## 2020-02-26 ENCOUNTER — Telehealth: Payer: Self-pay

## 2020-02-26 NOTE — Telephone Encounter (Signed)
  Patient Consent for Virtual Visit         Nathan Waters has provided verbal consent on 02/26/2020 for a virtual visit (video or telephone).   CONSENT FOR VIRTUAL VISIT FOR:  Nathan Waters  By participating in this virtual visit I agree to the following:  I hereby voluntarily request, consent and authorize [facility] and its employed or contracted physicians, physician assistants, nurse practitioners or other licensed health care professionals (the Practitioner), to provide me with telemedicine health care services (the "Services") as deemed necessary by the treating Practitioner. I acknowledge and consent to receive the Services by the Practitioner via telemedicine. I understand that the telemedicine visit will involve communicating with the Practitioner through live audiovisual communication technology and the disclosure of certain medical information by electronic transmission. I acknowledge that I have been given the opportunity to request an in-person assessment or other available alternative prior to the telemedicine visit and am voluntarily participating in the telemedicine visit.  I understand that I have the right to withhold or withdraw my consent to the use of telemedicine in the course of my care at any time, without affecting my right to future care or treatment, and that the Practitioner or I may terminate the telemedicine visit at any time. I understand that I have the right to inspect all information obtained and/or recorded in the course of the telemedicine visit and may receive copies of available information for a reasonable fee.  I understand that some of the potential risks of receiving the Services via telemedicine include:  Marland Kitchen Delay or interruption in medical evaluation due to technological equipment failure or disruption; . Information transmitted may not be sufficient (e.g. poor resolution of images) to allow for appropriate medical decision making by the Practitioner;  and/or  . In rare instances, security protocols could fail, causing a breach of personal health information.  Furthermore, I acknowledge that it is my responsibility to provide information about my medical history, conditions and care that is complete and accurate to the best of my ability. I acknowledge that Practitioner's advice, recommendations, and/or decision may be based on factors not within their control, such as incomplete or inaccurate data provided by me or distortions of diagnostic images or specimens that may result from electronic transmissions. I understand that the practice of medicine is not an exact science and that Practitioner makes no warranties or guarantees regarding treatment outcomes. I acknowledge that a copy of this consent can be made available to me via my patient portal ([place]), or I can request a printed copy by calling the office of [facility].    I understand that my insurance will be billed for this visit.   I have read or had this consent read to me. . I understand the contents of this consent, which adequately explains the benefits and risks of the Services being provided via telemedicine.  . I have been provided ample opportunity to ask questions regarding this consent and the Services and have had my questions answered to my satisfaction. . I give my informed consent for the services to be provided through the use of telemedicine in my medical care

## 2020-02-27 ENCOUNTER — Encounter: Payer: Self-pay | Admitting: Cardiology

## 2020-02-27 NOTE — Progress Notes (Signed)
Virtual Visit via Telephone Note   This visit type was conducted due to national recommendations for restrictions regarding the COVID-19 Pandemic (e.g. social distancing) in an effort to limit this patient's exposure and mitigate transmission in our community.  Due to his co-morbid illnesses, this patient is at least at moderate risk for complications without adequate follow up.  This format is felt to be most appropriate for this patient at this time.  The patient did not have access to video technology/had technical difficulties with video requiring transitioning to audio format only (telephone).  All issues noted in this document were discussed and addressed.  No physical exam could be performed with this format.  Please refer to the patient's chart for his  consent to telehealth for [facility].   The patient was identified using 2 identifiers.  Date:  02/28/2020   ID:  Nathan Waters, DOB 01-Oct-1981, MRN [id number]  Patient Location: Home Provider Location: Home  PCP:  Sofie Rower, PA-C  Cardiologist:  Rozann Lesches, MD Electrophysiologist:  None   Evaluation Performed:  Follow-Up Visit  Chief Complaint:   Cardiac follow-up  History of Present Illness:    HOVSEP SINE is a 39 y.o. male last seen in July 2018.  He is overdue for follow-up.  We spoke by phone today.  From a cardiac perspective he does not report any obvious angina symptoms or nitroglycerin use.  Per chart review I see that he is being managed by Dr. Delton Coombes for nodular lymphocyte predominant Hodgkin lymphoma, currently undergoing R-CHOP.  This is obviously the most pressing medical issue for him at this time.  He has a follow-up PET scan scheduled for later this month.  As of last evaluation in 2018 he was to be on aspirin, Pravachol, and as needed nitroglycerin.  We will plan to bring him back to the office in the next 6 months depending on his cancer treatment and progress and can discuss  resumption of basic medical therapy from a cardiac perspective with repeat lipids.   Past Medical History:  Diagnosis Date  . Asthma   . Coronary atherosclerosis of native coronary artery    BMS to mid circumflex 11/2011, LVEF 55-60%  . Essential hypertension   . Migraine   . Mixed hyperlipidemia   . STEMI (ST elevation myocardial infarction) ([place])    Complicated by VF arrest 11/2011   Past Surgical History:  Procedure Laterality Date  . AXILLARY LYMPH NODE BIOPSY Left 07/26/2019   Procedure: AXILLARY LYMPH NODE BIOPSY;  Surgeon: Aviva Signs, MD;  Location: [facility];  Service: General;  Laterality: Left;  . BACK SURGERY    . CARDIAC CATHETERIZATION    . CHOLECYSTECTOMY N/A 08/26/2019   Procedure: LAPAROSCOPIC CHOLECYSTECTOMY;  Surgeon: Aviva Signs, MD;  Location: [facility] ORS;  Service: General;  Laterality: N/A;  . LEFT HEART CATHETERIZATION WITH CORONARY ANGIOGRAM N/A 11/21/2011   Procedure: LEFT HEART CATHETERIZATION WITH CORONARY ANGIOGRAM;  Surgeon: Peter M Martinique, MD;  Location: [facility] CATH LAB;  Service: Cardiovascular;  Laterality: N/A;  . LYMPH NODE BIOPSY Right 08/26/2019   Procedure: LYMPH NODE BIOPSY, INGUINAL;  Surgeon: Aviva Signs, MD;  Location: [facility]S;  Service: General;  Laterality: Right;  . PERCUTANEOUS CORONARY STENT INTERVENTION (PCI-S) N/A 11/21/2011   Procedure: PERCUTANEOUS CORONARY STENT INTERVENTION (PCI-S);  Surgeon: Peter M Martinique, MD;  Location: [facility] CATH LAB;  Service: Cardiovascular;  Laterality: N/A;  . PORTACATH PLACEMENT Left 10/01/2019   Procedure: INSERTION PORT-A-CATH (catheter attached left subclavian);  Surgeon:  Aviva Signs, MD;  Location: [facility] ORS;  Service: General;  Laterality: Left;  . SPLENECTOMY       Current Meds  Medication Sig  . albuterol (PROVENTIL HFA;VENTOLIN HFA) 108 (90 Base) MCG/ACT inhaler Inhale 2 puffs into the lungs every 6 (six) hours as needed for wheezing or shortness of breath.   . cetirizine (ZYRTEC) 10 MG tablet Take 10 mg by mouth  daily as needed for allergies.   . clindamycin (CLEOCIN) 300 MG capsule Take 1 capsule (300 mg total) by mouth 3 (three) times daily.  . nitroGLYCERIN (NITROSTAT) 0.4 MG SL tablet Place 1 tablet (0.4 mg total) under the tongue every 5 (five) minutes as needed for chest pain.  . [DISCONTINUED] nitroGLYCERIN (NITROSTAT) 0.4 MG SL tablet Place 1 tablet (0.4 mg total) under the tongue every 5 (five) minutes as needed for chest pain.     Allergies:   Penicillins   ROS:   No palpitations or syncope.  Prior CV studies:   The following studies were reviewed today:   Echocardiogram 09/19/2019: 1. Left ventricular ejection fraction, by visual estimation, is 55 to  60%. The left ventricle has normal function. There is no left ventricular  hypertrophy.  2. Global right ventricle has normal systolic function.The right  ventricular size is normal. No increase in right ventricular wall  thickness.  3. Left atrial size was normal.  4. Right atrial size was normal.  5. The mitral valve is normal in structure. Trace mitral valve  regurgitation. No evidence of mitral stenosis.  6. The tricuspid valve is normal in structure. Tricuspid valve  regurgitation is trivial.  7. The aortic valve is tricuspid. Aortic valve regurgitation is not  visualized. No evidence of aortic valve sclerosis or stenosis.  8. The pulmonic valve was not well visualized. Pulmonic valve  regurgitation is not visualized.  9. The inferior vena cava is normal in size with greater than 50%  respiratory variability, suggesting right atrial pressure of 3 mmHg.   Labs/Other Tests and Data Reviewed:    EKG:  An ECG dated 08/29/2019 was personally reviewed today and demonstrated:  Sinus rhythm with borderline PR prolongation, decreased R wave progression with repolarization abnormalities.  Recent Labs: 02/04/2020: ALT 29; BUN 10; Creatinine, Ser 0.79; Hemoglobin 12.6; Magnesium 2.0; Platelets 447; Potassium 3.5; Sodium 140    Recent Lipid Panel Lab Results  Component Value Date/Time   CHOL 151 05/04/2017 12:01 PM   TRIG 90 05/04/2017 12:01 PM   HDL 31 (L) 05/04/2017 12:01 PM   CHOLHDL 4.9 05/04/2017 12:01 PM   LDLCALC 102 (H) 05/04/2017 12:01 PM    Wt Readings from Last 3 Encounters:  02/28/20 260 lb (117.9 kg)  02/04/20 267 lb 12.8 oz (121.5 kg)  01/24/20 270 lb (122.5 kg)     Objective:    Vital Signs:  Ht 5\' 11"  (1.803 m)   Wt 260 lb (117.9 kg)   BMI 36.26 kg/m    Patient spoke in full sentences, not short of breath. No audible wheezing or coughing.  ASSESSMENT & PLAN:    1.  CAD status post BMS to the mid circumflex in 2013.  He does not report any active angina at this time.  Refill provided for fresh bottle of nitroglycerin.  Plan to bring him back to the office within the next 6 months for clinical reevaluation.  He has not been on aspirin or Pravachol recently, currently undergoing chemotherapy for treatment of lymphoma.  2.  Nodular lymphocyte predominant Hodgkin lymphoma,  currently undergoing R-CHOP per Dr. Delton Coombes.  He has a follow-up PET scan scheduled later this month.  3.  Mixed hyperlipidemia, previously on Pravachol.  Can plan to follow-up lipids and consider resumption of statin at next office encounter.   Time:   Today, I have spent 5 minutes with the patient with telehealth technology discussing the above problems.     Medication Adjustments/Labs and Tests Ordered: Current medicines are reviewed at length with the patient today.  Concerns regarding medicines are outlined above.   Tests Ordered: No orders of the defined types were placed in this encounter.   Medication Changes: Meds ordered this encounter  Medications  . nitroGLYCERIN (NITROSTAT) 0.4 MG SL tablet    Sig: Place 1 tablet (0.4 mg total) under the tongue every 5 (five) minutes as needed for chest pain.    Dispense:  25 tablet    Refill:  3    Follow Up:  In Person 6 months with [place] in the  [place] office.  Signed, Rozann Lesches, MD  02/28/2020 9:28 AM    [place]

## 2020-02-28 ENCOUNTER — Encounter: Payer: Self-pay | Admitting: Cardiology

## 2020-02-28 ENCOUNTER — Telehealth (INDEPENDENT_AMBULATORY_CARE_PROVIDER_SITE_OTHER): Payer: Medicaid Other | Admitting: Cardiology

## 2020-02-28 VITALS — Ht 71.0 in | Wt 260.0 lb

## 2020-02-28 DIAGNOSIS — E782 Mixed hyperlipidemia: Secondary | ICD-10-CM | POA: Diagnosis not present

## 2020-02-28 DIAGNOSIS — I25119 Atherosclerotic heart disease of native coronary artery with unspecified angina pectoris: Secondary | ICD-10-CM | POA: Diagnosis not present

## 2020-02-28 MED ORDER — NITROGLYCERIN 0.4 MG SL SUBL: 0.4000 mg | SUBLINGUAL_TABLET | SUBLINGUAL | 3 refills | Status: DC | PRN

## 2020-02-28 NOTE — Patient Instructions (Signed)
Medication Instructions:  Your physician recommends that you continue on your current medications as directed. Please refer to the Current Medication list given to you today.    I refilled your NTG   *If you need a refill on your cardiac medications before your next appointment, please call your pharmacy*   Lab Work: None today If you have labs (blood work) drawn today and your tests are completely normal, you will receive your results only by: Marland Kitchen MyChart Message (if you have MyChart) OR . A paper copy in the mail If you have any lab test that is abnormal or we need to change your treatment, we will call you to review the results.   Testing/Procedures: None today   Follow-Up: At [facility], you and your health needs are our priority.  As part of our continuing mission to provide you with exceptional heart care, we have created designated Provider Care Teams.  These Care Teams include your primary Cardiologist (physician) and Advanced Practice Providers (APPs -  Physician Assistants and Nurse Practitioners) who all work together to provide you with the care you need, when you need it.  We recommend signing up for the patient portal called "MyChart".  Sign up information is provided on this After Visit Summary.  MyChart is used to connect with patients for Virtual Visits (Telemedicine).  Patients are able to view lab/test results, encounter notes, upcoming appointments, etc.  Non-urgent messages can be sent to your provider as well.   To learn more about what you can do with MyChart, go to [email or web address].    Your next appointment:   6 month(s)  The format for your next appointment:   In Person  Provider:   Bernerd Pho, PA-C   Other Instructions None          Thank you for choosing [facility] !

## 2020-03-09 ENCOUNTER — Encounter (HOSPITAL_COMMUNITY)
Admission: RE | Admit: 2020-03-09 | Discharge: 2020-03-09 | Disposition: A | Discharge status: Home / Self Care | Payer: Medicaid Other | Source: Ambulatory Visit | Attending: Hematology | Admitting: Hematology

## 2020-03-09 ENCOUNTER — Encounter (HOSPITAL_COMMUNITY): Payer: Self-pay

## 2020-03-09 ENCOUNTER — Other Ambulatory Visit: Payer: Self-pay

## 2020-03-09 DIAGNOSIS — C8108 Nodular lymphocyte predominant Hodgkin lymphoma, lymph nodes of multiple sites: Secondary | ICD-10-CM | POA: Insufficient documentation

## 2020-03-12 ENCOUNTER — Ambulatory Visit (HOSPITAL_COMMUNITY): Payer: Medicaid Other | Admitting: Hematology

## 2020-03-12 ENCOUNTER — Inpatient Hospital Stay (HOSPITAL_COMMUNITY): Payer: Medicaid Other

## 2020-03-24 ENCOUNTER — Encounter (HOSPITAL_COMMUNITY)
Admission: RE | Admit: 2020-03-24 | Discharge: 2020-03-24 | Disposition: A | Discharge status: Home / Self Care | Payer: Medicaid Other | Source: Ambulatory Visit | Attending: Hematology | Admitting: Hematology

## 2020-03-24 ENCOUNTER — Other Ambulatory Visit: Payer: Self-pay

## 2020-03-24 DIAGNOSIS — C8108 Nodular lymphocyte predominant Hodgkin lymphoma, lymph nodes of multiple sites: Secondary | ICD-10-CM | POA: Diagnosis present

## 2020-03-24 LAB — GLUCOSE, CAPILLARY: Glucose-Capillary: 117 mg/dL — ABNORMAL HIGH (ref 70–99)

## 2020-03-24 MED ORDER — FLUDEOXYGLUCOSE F - 18 (FDG) INJECTION
13.6400 | Freq: Once | INTRAVENOUS | Status: AC | PRN
  Administered 2020-03-24: 13.64 via INTRAVENOUS

## 2020-03-25 ENCOUNTER — Inpatient Hospital Stay (HOSPITAL_COMMUNITY): Payer: Medicaid Other | Attending: Hematology

## 2020-03-25 ENCOUNTER — Other Ambulatory Visit: Payer: Self-pay

## 2020-03-25 DIAGNOSIS — C8190 Hodgkin lymphoma, unspecified, unspecified site: Secondary | ICD-10-CM | POA: Insufficient documentation

## 2020-03-25 DIAGNOSIS — G629 Polyneuropathy, unspecified: Secondary | ICD-10-CM | POA: Diagnosis not present

## 2020-03-25 DIAGNOSIS — C8108 Nodular lymphocyte predominant Hodgkin lymphoma, lymph nodes of multiple sites: Secondary | ICD-10-CM

## 2020-03-25 LAB — COMPREHENSIVE METABOLIC PANEL
ALT: 41 U/L (ref 0–44)
AST: 31 U/L (ref 15–41)
Albumin: 3.8 g/dL (ref 3.5–5.0)
Alkaline Phosphatase: 94 U/L (ref 38–126)
Anion gap: 7 (ref 5–15)
BUN: 10 mg/dL (ref 6–20)
CO2: 25 mmol/L (ref 22–32)
Calcium: 8.9 mg/dL (ref 8.9–10.3)
Chloride: 106 mmol/L (ref 98–111)
Creatinine, Ser: 0.8 mg/dL (ref 0.61–1.24)
GFR calc Af Amer: >60 mL/min (ref >60)
GFR calc non Af Amer: >60 mL/min (ref >60)
Glucose, Bld: 127 mg/dL — ABNORMAL HIGH (ref 70–99)
Potassium: 4.5 mmol/L (ref 3.5–5.1)
Sodium: 138 mmol/L (ref 135–145)
Total Bilirubin: 0.4 mg/dL (ref 0.3–1.2)
Total Protein: 6.7 g/dL (ref 6.5–8.1)

## 2020-03-25 LAB — CBC WITH DIFFERENTIAL/PLATELET
Abs Immature Granulocytes: 0.03 10*3/uL (ref 0.00–0.07)
Basophils Absolute: 0.1 10*3/uL (ref 0.0–0.1)
Basophils Relative: 1 %
Eosinophils Absolute: 0.2 10*3/uL (ref 0.0–0.5)
Eosinophils Relative: 4 %
HCT: 45.9 % (ref 39.0–52.0)
Hemoglobin: 15 g/dL (ref 13.0–17.0)
Immature Granulocytes: 0 %
Lymphocytes Relative: 21 %
Lymphs Abs: 1.5 10*3/uL (ref 0.7–4.0)
MCH: 33.8 pg (ref 26.0–34.0)
MCHC: 32.7 g/dL (ref 30.0–36.0)
MCV: 103.4 fL — ABNORMAL HIGH (ref 80.0–100.0)
Monocytes Absolute: 1.1 10*3/uL — ABNORMAL HIGH (ref 0.1–1.0)
Monocytes Relative: 16 %
Neutro Abs: 4 10*3/uL (ref 1.7–7.7)
Neutrophils Relative %: 58 %
Platelets: 250 10*3/uL (ref 150–400)
RBC: 4.44 MIL/uL (ref 4.22–5.81)
RDW: 13.3 % (ref 11.5–15.5)
WBC: 6.9 10*3/uL (ref 4.0–10.5)
nRBC: 0 % (ref 0.0–0.2)

## 2020-03-30 ENCOUNTER — Inpatient Hospital Stay (HOSPITAL_BASED_OUTPATIENT_CLINIC_OR_DEPARTMENT_OTHER): Payer: Medicaid Other | Admitting: Hematology

## 2020-03-30 ENCOUNTER — Other Ambulatory Visit: Payer: Self-pay

## 2020-03-30 VITALS — BP 111/80 | HR 84 | Temp 97.7°F | Resp 18 | Wt 275.2 lb

## 2020-03-30 DIAGNOSIS — C8108 Nodular lymphocyte predominant Hodgkin lymphoma, lymph nodes of multiple sites: Secondary | ICD-10-CM

## 2020-03-30 DIAGNOSIS — C8190 Hodgkin lymphoma, unspecified, unspecified site: Secondary | ICD-10-CM | POA: Diagnosis not present

## 2020-03-30 NOTE — Progress Notes (Signed)
[place], [place] [address]   CLINIC:  Medical Oncology/Hematology  PCP:  Sofie Rower, PA-C [address] / [place] [address]  [phone]  REASON FOR VISIT:  Follow-up for Hodgkin lymphoma  PRIOR THERAPY: None.  CURRENT THERAPY: R-CHOP  INTERVAL HISTORY:  [name], a 39 y.o. male, returns for routine follow-up for his Hodgkin lymphoma. Swanson was last seen on 02/04/2020.  His numbness and tingling in his fingertips has resolved and he is not dropping things from his hands. His energy levels are increasing and he can play basketball. He denies having F/C or night sweats recently. He recently contacted his cardiologist over the phone. He has a family history of heart disease, cancer and diabetes on both sides of his family.   REVIEW OF SYSTEMS:  Review of Systems  Constitutional: Negative for appetite change, chills, diaphoresis, fatigue and fever.  Gastrointestinal: Negative for abdominal pain.  Neurological: Negative for numbness.  All other systems reviewed and are negative.   PAST MEDICAL/SURGICAL HISTORY:  Past Medical History:  Diagnosis Date  . Asthma   . Coronary atherosclerosis of native coronary artery    BMS to mid circumflex 11/2011, LVEF 55-60%  . Essential hypertension   . Migraine   . Mixed hyperlipidemia   . STEMI (ST elevation myocardial infarction) ([place])    Complicated by VF arrest 11/2011   Past Surgical History:  Procedure Laterality Date  . AXILLARY LYMPH NODE BIOPSY Left 07/26/2019   Procedure: AXILLARY LYMPH NODE BIOPSY;  Surgeon: Aviva Signs, MD;  Location: [facility] ORS;  Service: General;  Laterality: Left;  . BACK SURGERY    . CARDIAC CATHETERIZATION    . CHOLECYSTECTOMY N/A 08/26/2019   Procedure: LAPAROSCOPIC CHOLECYSTECTOMY;  Surgeon: Aviva Signs, MD;  Location: [facility] ORS;  Service: General;  Laterality: N/A;  . LEFT HEART CATHETERIZATION WITH CORONARY ANGIOGRAM N/A 11/21/2011    Procedure: LEFT HEART CATHETERIZATION WITH CORONARY ANGIOGRAM;  Surgeon: Peter M Martinique, MD;  Location: [facility] CATH LAB;  Service: Cardiovascular;  Laterality: N/A;  . LYMPH NODE BIOPSY Right 08/26/2019   Procedure: LYMPH NODE BIOPSY, INGUINAL;  Surgeon: Aviva Signs, MD;  Location: [facility] ORS;  Service: General;  Laterality: Right;  . PERCUTANEOUS CORONARY STENT INTERVENTION (PCI-S) N/A 11/21/2011   Procedure: PERCUTANEOUS CORONARY STENT INTERVENTION (PCI-S);  Surgeon: Peter M Martinique, MD;  Location: [facility] CATH LAB;  Service: Cardiovascular;  Laterality: N/A;  . PORTACATH PLACEMENT Left 10/01/2019   Procedure: INSERTION PORT-A-CATH (catheter attached left subclavian);  Surgeon: Aviva Signs, MD;  Location: [facility] ORS;  Service: General;  Laterality: Left;  . SPLENECTOMY      SOCIAL HISTORY:  Social History   Socioeconomic History  . Marital status: Divorced    Spouse name: Not on file  . Number of children: 2  . Years of education: Not on file  . Highest education level: Not on file  Occupational History  . Occupation: not employed  Tobacco Use  . Smoking status: Current Every Day Smoker    Packs/day: 1.00    Types: Cigarettes  . Smokeless tobacco: Never Used  Vaping Use  . Vaping Use: Former  . Start date: 04/27/2012  Substance and Sexual Activity  . Alcohol use: Yes    Comment: Occasionally  . Drug use: No  . Sexual activity: Yes    Birth control/protection: Rhythm  Other Topics Concern  . Not on file  Social History Narrative  . Not on file  Social Determinants of Health   Financial Resource Strain: Low Risk   . Difficulty of Paying Living Expenses: Not hard at all  Food Insecurity: No Food Insecurity  . Worried About Charity fundraiser in the Last Year: Never true  . Ran Out of Food in the Last Year: Never true  Transportation Needs: No Transportation Needs  . Lack of Transportation (Medical): No  . Lack of Transportation (Non-Medical): No  Physical Activity: Insufficiently  Active  . Days of Exercise per Week: 3 days  . Minutes of Exercise per Session: 30 min  Stress: No Stress Concern Present  . Feeling of Stress : Only a little  Social Connections: Socially Isolated  . Frequency of Communication with Friends and Family: More than three times a week  . Frequency of Social Gatherings with Friends and Family: Twice a week  . Attends Religious Services: Never  . Active Member of Clubs or Organizations: No  . Attends Archivist Meetings: Not asked  . Marital Status: Divorced  Human resources officer Violence: Not At Risk  . Fear of Current or Ex-Partner: No  . Emotionally Abused: No  . Physically Abused: No  . Sexually Abused: No    FAMILY HISTORY:  Family History  Problem Relation Age of Onset  . Malignant hyperthermia Mother   . Cancer Mother   . Malignant hyperthermia Brother   . Heart disease Brother   . Asthma Brother   . COPD Brother   . Heart disease Father   . Cancer Father   . ADD / ADHD Daughter   . ADD / ADHD Son     CURRENT MEDICATIONS:  Current Outpatient Medications  Medication Sig Dispense Refill  . K Phos Mono-Sod Phos Di & Mono (PHOSPHA 250 NEUTRAL) [phone] MG TABS Take 1 tablet by mouth 2 (two) times daily. 120 tablet 0  . nitroGLYCERIN (NITROSTAT) 0.4 MG SL tablet Place 1 tablet (0.4 mg total) under the tongue every 5 (five) minutes as needed for chest pain. 25 tablet 3  . albuterol (PROVENTIL HFA;VENTOLIN HFA) 108 (90 Base) MCG/ACT inhaler Inhale 2 puffs into the lungs every 6 (six) hours as needed for wheezing or shortness of breath.  (Patient not taking: Reported on 03/30/2020)    . cetirizine (ZYRTEC) 10 MG tablet Take 10 mg by mouth daily as needed for allergies.  (Patient not taking: Reported on 03/30/2020)    . clindamycin (CLEOCIN) 300 MG capsule Take 1 capsule (300 mg total) by mouth 3 (three) times daily. (Patient not taking: Reported on 03/30/2020) 21 capsule 0  . CYCLOPHOSPHAMIDE IV Inject into the vein every 21  ( twenty-one) days. (Patient not taking: Reported on 03/30/2020)    . DOXORUBICIN HCL IV Inject into the vein every 21 ( twenty-one) days. (Patient not taking: Reported on 03/30/2020)    . lidocaine-prilocaine (EMLA) cream Apply a small amount to port a cath site and cover with plastic wrap 1 hour prior to chemotherapy appointments (Patient not taking: Reported on 03/30/2020) 30 g 0  . predniSONE (DELTASONE) 20 MG tablet Take 5 tablets daily for five days, with each cycle of Chemotherapy (Patient not taking: Reported on 03/30/2020) 125 tablet 0  . prochlorperazine (COMPAZINE) 10 MG tablet Take 1 tablet (10 mg total) by mouth every 6 (six) hours as needed for nausea or vomiting. (Patient not taking: Reported on 03/30/2020) 30 tablet 0  . riTUXimab in sodium chloride 0.9 % 250 mL Inject into the vein every 21 ( twenty-one) days. (Patient  not taking: Reported on 03/30/2020)    . vinCRIStine 2 mg in sodium chloride 0.9 % 50 mL Inject 2 mg into the vein every 21 ( twenty-one) days. (Patient not taking: Reported on 03/30/2020)     No current facility-administered medications for this visit.    ALLERGIES:  Allergies  Allergen Reactions  . Penicillins Anaphylaxis    Did it involve swelling of the face/tongue/throat, SOB, or low BP? Yes Did it involve sudden or severe rash/hives, skin peeling, or any reaction on the inside of your mouth or nose? No Did you need to seek medical attention at a hospital or doctor's office? Yes When did it last happen?Childhood allergy If all above answers are "NO", may proceed with cephalosporin use.     PHYSICAL EXAM:  Performance status (ECOG): 0 - Asymptomatic  Vitals:   03/30/20 1109  BP: 111/80  Pulse: 84  Resp: 18  Temp: 97.7 F (36.5 C)  SpO2: 97%   Wt Readings from Last 3 Encounters:  03/30/20 275 lb 3.2 oz (124.8 kg)  02/28/20 260 lb (117.9 kg)  02/04/20 267 lb 12.8 oz (121.5 kg)   Physical Exam Vitals reviewed.  Constitutional:       Appearance: Normal appearance. He is obese.  Cardiovascular:     Rate and Rhythm: Normal rate and regular rhythm.     Pulses: Normal pulses.     Heart sounds: Normal heart sounds.  Pulmonary:     Effort: Pulmonary effort is normal.     Breath sounds: Normal breath sounds.  Abdominal:     Palpations: Abdomen is soft. There is no mass.     Tenderness: There is no abdominal tenderness.     Hernia: There is no hernia in the right femoral area or right inguinal area.  Musculoskeletal:     Right lower leg: No edema.     Left lower leg: No edema.  Lymphadenopathy:     Lower Body: No right inguinal adenopathy. No left inguinal adenopathy.  Neurological:     General: No focal deficit present.     Mental Status: He is alert and oriented to person, place, and time.  Psychiatric:        Mood and Affect: Mood normal.        Behavior: Behavior normal.     LABORATORY DATA:  I have reviewed the labs as listed.  CBC Latest Ref Rng & Units 03/25/2020 02/04/2020 01/24/2020  WBC 4.0 - 10.5 K/uL 6.9 7.2 2.3(L)  Hemoglobin 13.0 - 17.0 g/dL 15.0 12.6(L) 11.9(L)  Hematocrit 39 - 52 % 45.9 38.4(L) 35.9(L)  Platelets 150 - 400 K/uL 250 447(H) 85(L)   CMP Latest Ref Rng & Units 03/25/2020 02/04/2020 01/24/2020  Glucose 70 - 99 mg/dL 127(H) 141(H) 111(H)  BUN 6 - 20 mg/dL 10 10 9   Creatinine 0.61 - 1.24 mg/dL 0.80 0.79 0.77  Sodium 135 - 145 mmol/L 138 140 136  Potassium 3.5 - 5.1 mmol/L 4.5 3.5 3.7  Chloride 98 - 111 mmol/L 106 109 105  CO2 22 - 32 mmol/L 25 25 24   Calcium 8.9 - 10.3 mg/dL 8.9 8.8(L) 9.0  Total Protein 6.5 - 8.1 g/dL 6.7 6.4(L) 7.0  Total Bilirubin 0.3 - 1.2 mg/dL 0.4 0.4 0.7  Alkaline Phos 38 - 126 U/L 94 80 93  AST 15 - 41 U/L 31 19 24   ALT 0 - 44 U/L 41 29 41      Component Value Date/Time   RBC 4.44 03/25/2020 1549  MCV 103.4 (H) 03/25/2020 1549   MCH 33.8 03/25/2020 1549   MCHC 32.7 03/25/2020 1549   RDW 13.3 03/25/2020 1549   LYMPHSABS 1.5 03/25/2020 1549   MONOABS 1.1 (H)  03/25/2020 1549   EOSABS 0.2 03/25/2020 1549   BASOSABS 0.1 03/25/2020 1549    DIAGNOSTIC IMAGING:  I have independently reviewed the scans and discussed with the patient. NM PET Image Restag (PS) Skull Base To Thigh  Result Date: 03/25/2020 CLINICAL DATA:  Subsequent treatment strategy for Hodgkin's lymphoma. EXAM: NUCLEAR MEDICINE PET SKULL BASE TO THIGH TECHNIQUE: 13.6 mCi F-18 FDG was injected intravenously. Full-ring PET imaging was performed from the skull base to thigh after the radiotracer. CT data was obtained and used for attenuation correction and anatomic localization. Fasting blood glucose: 117 mg/dl COMPARISON:  PET-CT from 12/19/2019 FINDINGS: Mediastinal blood pool activity: SUV max 2.5 Liver activity: SUV max 3.6 NECK: Low-grade physiologic activity along the palatine tonsils, right side maximum SUV 5.6 (formerly 6.7) and left-sided maximum SUV 4.9 (formerly 8.9). No appreciable hypermetabolic adenopathy in the neck. Incidental CT findings: None CHEST: Scattered small left axillary nodes are observed, one of the largest is a 0.7 cm in short axis lymph node on image 65/4 with maximum SUV 1.7, previously 0.9 cm diameter with previous maximum SUV of 2.2. The dominant right paratracheal lymph node which was enlarged and high metabolic on the 34/19/6222 study currently measures 0.7 cm in short axis on image 68/4 (previously 2.7 cm on 08/12/2019 and previously 0.8 cm on 12/19/2019) with maximum SUV of 2.5 (previously 2.8), Deauville 2. Incidental CT findings: Left Port-A-Cath tip: SVC. Scarring in the lingula mild atelectasis and scarring in the right middle lobe. ABDOMEN/PELVIS: Right external iliac node 1.5 cm in short axis on image 197/4, previously 1.8 cm by my measurements, with maximum SUV 2.2 (formerly 2.1), Deauville 2. A separate right external iliac node measures 1.1 cm in short axis on image 190/4 (formerly 1.2 cm) maximum SUV 2.2 (formerly 2.0), Deauville 2. Small retroperitoneal lymph  nodes demonstrate low-grade activity. No newly enlarged or substantially hypermetabolic lymph nodes are identified. Incidental CT findings: Cholecystectomy. SKELETON: Reduced diffuse marrow activity in the skeleton. Incidental CT findings: Posterolateral rod and pedicle screw fixation at L4-5 with interbody fusion. IMPRESSION: 1. In the vicinity of previously dominant adenopathy especially in the pelvis and to a lesser extent in the chest and abdomen, is currently Deauville 2 nodal activity with most of the lymph nodes within typical normal size limits, although 2 of the right external iliac lymph nodes remain mildly enlarged. 2. Reduction of the prior diffuse accentuated marrow activity in the skeleton, without focal skeletal abnormality, likely treatment related. 3. Low-grade (and reduced) likely physiologic activity in the palatine tonsils. Electronically Signed   By: Van Clines M.D.   On: 03/25/2020 08:21     ASSESSMENT:  1.  NLP Hodgkin's disease: -6 cycles of R-CHOP from 10/23/2019 through 02/04/2020. -PET scan on 03/25/2020 showed most of the lymph nodes with Deauville 2 activity with normal size limits.  2 of the right external iliac lymph nodes remain mildly enlarged.  Right thigh mass also normalized.    PLAN:  1.  NLP Hodgkin's disease: -I have reviewed his labs which are grossly within normal limits. -I reviewed images of the PET scan which showed complete response. -I plan to see him back in 3 months for follow-up.  I plan to repeat scan in 6 months.  2.  Peripheral neuropathy: -He had developed constant numbness in  the fingertips.  We had to dose reduce vincristine.  Now his neuropathy has completely disappeared.   Orders placed this encounter:  No orders of the defined types were placed in this encounter.    Derek Jack, MD [place] [phone]   I, Milinda Antis, am acting as a scribe for Dr. Sanda Linger.  I, Derek Jack  MD, have reviewed the above documentation for accuracy and completeness, and I agree with the above.

## 2020-03-30 NOTE — Patient Instructions (Signed)
[facility] at [facility] °Discharge Instructions ° °You were seen today by Dr. Katragadda. He went over your recent results. Please continue your routine care with your primary care provider. Dr. Katragadda will see you back in 3 months for labs and follow up. ° ° °Thank you for choosing [facility] at [facility] to provide your oncology and hematology care.  To afford each patient quality time with our provider, please arrive at least 15 minutes before your scheduled appointment time.  ° °If you have a lab appointment with the Cancer Center please come in thru the Main Entrance and check in at the main information desk ° °You need to re-schedule your appointment should you arrive 10 or more minutes late.  We strive to give you quality time with our providers, and arriving late affects you and other patients whose appointments are after yours.  Also, if you no show three or more times for appointments you may be dismissed from the clinic at the providers discretion.     °Again, thank you for choosing [facility].  Our hope is that these requests will decrease the amount of time that you wait before being seen by our physicians.       °_____________________________________________________________ ° °Should you have questions after your visit to [facility], please contact our office at [phone] between the hours of 8:00 a.m. and 4:30 p.m.  Voicemails left after 4:00 p.m. will not be returned until the following business day.  For prescription refill requests, have your pharmacy contact our office and allow 72 hours.   ° °Cancer Center Support Programs:  ° °> Cancer Support Group  °2nd Tuesday of the month 1pm-2pm, Journey Room  ° ° °

## 2020-04-29 ENCOUNTER — Other Ambulatory Visit: Payer: Self-pay

## 2020-04-29 ENCOUNTER — Encounter (HOSPITAL_COMMUNITY): Payer: Self-pay

## 2020-04-29 ENCOUNTER — Inpatient Hospital Stay (HOSPITAL_COMMUNITY): Payer: Medicaid Other | Attending: Hematology

## 2020-04-29 DIAGNOSIS — C8108 Nodular lymphocyte predominant Hodgkin lymphoma, lymph nodes of multiple sites: Secondary | ICD-10-CM | POA: Diagnosis present

## 2020-04-29 DIAGNOSIS — Z452 Encounter for adjustment and management of vascular access device: Secondary | ICD-10-CM | POA: Insufficient documentation

## 2020-04-29 LAB — CBC WITH DIFFERENTIAL/PLATELET
Abs Immature Granulocytes: 0.02 10*3/uL (ref 0.00–0.07)
Basophils Absolute: 0 10*3/uL (ref 0.0–0.1)
Basophils Relative: 0 %
Eosinophils Absolute: 0.5 10*3/uL (ref 0.0–0.5)
Eosinophils Relative: 5 %
HCT: 43.9 % (ref 39.0–52.0)
Hemoglobin: 15.1 g/dL (ref 13.0–17.0)
Immature Granulocytes: 0 %
Lymphocytes Relative: 23 %
Lymphs Abs: 2.2 10*3/uL (ref 0.7–4.0)
MCH: 34.2 pg — ABNORMAL HIGH (ref 26.0–34.0)
MCHC: 34.4 g/dL (ref 30.0–36.0)
MCV: 99.3 fL (ref 80.0–100.0)
Monocytes Absolute: 1 10*3/uL (ref 0.1–1.0)
Monocytes Relative: 11 %
Neutro Abs: 5.8 10*3/uL (ref 1.7–7.7)
Neutrophils Relative %: 61 %
Platelets: 292 10*3/uL (ref 150–400)
RBC: 4.42 MIL/uL (ref 4.22–5.81)
RDW: 14.1 % (ref 11.5–15.5)
WBC: 9.6 10*3/uL (ref 4.0–10.5)
nRBC: 0 % (ref 0.0–0.2)

## 2020-04-29 LAB — COMPREHENSIVE METABOLIC PANEL
ALT: 44 U/L (ref 0–44)
AST: 29 U/L (ref 15–41)
Albumin: 3.8 g/dL (ref 3.5–5.0)
Alkaline Phosphatase: 96 U/L (ref 38–126)
Anion gap: 5 (ref 5–15)
BUN: 11 mg/dL (ref 6–20)
CO2: 24 mmol/L (ref 22–32)
Calcium: 8.6 mg/dL — ABNORMAL LOW (ref 8.9–10.3)
Chloride: 110 mmol/L (ref 98–111)
Creatinine, Ser: 0.77 mg/dL (ref 0.61–1.24)
GFR calc Af Amer: >60 mL/min (ref >60)
GFR calc non Af Amer: >60 mL/min (ref >60)
Glucose, Bld: 118 mg/dL — ABNORMAL HIGH (ref 70–99)
Potassium: 3.5 mmol/L (ref 3.5–5.1)
Sodium: 139 mmol/L (ref 135–145)
Total Bilirubin: 0.5 mg/dL (ref 0.3–1.2)
Total Protein: 6.6 g/dL (ref 6.5–8.1)

## 2020-04-29 MED ORDER — SODIUM CHLORIDE 0.9% FLUSH
10.0000 mL | Freq: Once | INTRAVENOUS | Status: AC
  Administered 2020-04-29: 10 mL via INTRAVENOUS

## 2020-04-29 MED ORDER — HEPARIN SOD (PORK) LOCK FLUSH 100 UNIT/ML IV SOLN
500.0000 [IU] | Freq: Once | INTRAVENOUS | Status: AC
  Administered 2020-04-29: 500 [IU] via INTRAVENOUS

## 2020-04-29 NOTE — Progress Notes (Signed)
Labs drawn from port.  Patients port flushed without difficulty.  Good blood return noted with no bruising or swelling noted at site.  Band aid applied.  VSS with discharge and left in satisfactory condition with no s/s of distress noted.  

## 2020-07-07 ENCOUNTER — Inpatient Hospital Stay (HOSPITAL_COMMUNITY): Payer: Medicaid Other | Attending: Hematology

## 2020-07-07 ENCOUNTER — Other Ambulatory Visit: Payer: Self-pay

## 2020-07-07 ENCOUNTER — Encounter (HOSPITAL_COMMUNITY): Payer: Self-pay

## 2020-07-07 DIAGNOSIS — Z8249 Family history of ischemic heart disease and other diseases of the circulatory system: Secondary | ICD-10-CM | POA: Diagnosis not present

## 2020-07-07 DIAGNOSIS — I252 Old myocardial infarction: Secondary | ICD-10-CM | POA: Diagnosis not present

## 2020-07-07 DIAGNOSIS — E782 Mixed hyperlipidemia: Secondary | ICD-10-CM | POA: Insufficient documentation

## 2020-07-07 DIAGNOSIS — C8108 Nodular lymphocyte predominant Hodgkin lymphoma, lymph nodes of multiple sites: Secondary | ICD-10-CM | POA: Diagnosis present

## 2020-07-07 DIAGNOSIS — I10 Essential (primary) hypertension: Secondary | ICD-10-CM | POA: Insufficient documentation

## 2020-07-07 DIAGNOSIS — J45909 Unspecified asthma, uncomplicated: Secondary | ICD-10-CM | POA: Diagnosis not present

## 2020-07-07 DIAGNOSIS — G629 Polyneuropathy, unspecified: Secondary | ICD-10-CM | POA: Diagnosis not present

## 2020-07-07 DIAGNOSIS — Z8674 Personal history of sudden cardiac arrest: Secondary | ICD-10-CM | POA: Diagnosis not present

## 2020-07-07 DIAGNOSIS — F17210 Nicotine dependence, cigarettes, uncomplicated: Secondary | ICD-10-CM | POA: Insufficient documentation

## 2020-07-07 DIAGNOSIS — Z452 Encounter for adjustment and management of vascular access device: Secondary | ICD-10-CM | POA: Insufficient documentation

## 2020-07-07 DIAGNOSIS — I2510 Atherosclerotic heart disease of native coronary artery without angina pectoris: Secondary | ICD-10-CM | POA: Insufficient documentation

## 2020-07-07 DIAGNOSIS — Z9221 Personal history of antineoplastic chemotherapy: Secondary | ICD-10-CM | POA: Insufficient documentation

## 2020-07-07 DIAGNOSIS — Z79899 Other long term (current) drug therapy: Secondary | ICD-10-CM | POA: Diagnosis not present

## 2020-07-07 MED ORDER — HEPARIN SOD (PORK) LOCK FLUSH 100 UNIT/ML IV SOLN
500.0000 [IU] | Freq: Once | INTRAVENOUS | Status: AC
  Administered 2020-07-07: 500 [IU] via INTRAVENOUS

## 2020-07-07 MED ORDER — SODIUM CHLORIDE 0.9% FLUSH
10.0000 mL | INTRAVENOUS | Status: DC | PRN
  Administered 2020-07-07: 10 mL via INTRAVENOUS

## 2020-07-07 NOTE — Patient Instructions (Signed)
Nathan Waters at [facility] Discharge Instructions  Portacath  flushed per protocol. Follow-up as scheduled   Thank you for choosing [place] at [facility] to provide your oncology and hematology care.  To afford each patient quality time with our provider, please arrive at least 15 minutes before your scheduled appointment time.   If you have a lab appointment with the [place] please come in thru the Main Entrance and check in at the main information desk.  You need to re-schedule your appointment should you arrive 10 or more minutes late.  We strive to give you quality time with our providers, and arriving late affects you and other patients whose appointments are after yours.  Also, if you no show three or more times for appointments you may be dismissed from the clinic at the providers discretion.     Again, thank you for choosing [facility].  Our hope is that these requests will decrease the amount of time that you wait before being seen by our physicians.       _____________________________________________________________  Should you have questions after your visit to [facility], please contact our office at [phone] and follow the prompts.  Our office hours are 8:00 a.m. and 4:30 p.m. Monday - Friday.  Please note that voicemails left after 4:00 p.m. may not be returned until the following business day.  We are closed weekends and major holidays.  You do have access to a nurse 24-7, just call the main number to the clinic [phone] and do not press any options, hold on the line and a nurse will answer the phone.    For prescription refill requests, have your pharmacy contact our office and allow 72 hours.    Due to Covid, you will need to wear a mask upon entering the hospital. If you do not have a mask, a mask will be given to you at the Main Entrance upon arrival. For doctor visits, patients may have  1 support person age 37 or older with them. For treatment visits, patients can not have anyone with them due to social distancing guidelines and our immunocompromised population.

## 2020-07-07 NOTE — Progress Notes (Signed)
Trinna Balloon Toya tolerated portacath flush well without complaints or incident. Port accessed with 20 gauge needle with blood return noted then flushed easily per protocol and de-accessed. VSS Pt discharged self ambulatory in satisfactory condition

## 2020-07-15 ENCOUNTER — Other Ambulatory Visit: Payer: Self-pay

## 2020-07-15 ENCOUNTER — Inpatient Hospital Stay (HOSPITAL_BASED_OUTPATIENT_CLINIC_OR_DEPARTMENT_OTHER): Payer: Medicaid Other | Admitting: Hematology

## 2020-07-15 ENCOUNTER — Encounter (HOSPITAL_COMMUNITY): Payer: Self-pay | Admitting: Hematology

## 2020-07-15 VITALS — BP 131/67 | HR 86 | Temp 97.5°F | Resp 18 | Wt 288.6 lb

## 2020-07-15 DIAGNOSIS — C8108 Nodular lymphocyte predominant Hodgkin lymphoma, lymph nodes of multiple sites: Secondary | ICD-10-CM | POA: Diagnosis not present

## 2020-07-15 DIAGNOSIS — Z452 Encounter for adjustment and management of vascular access device: Secondary | ICD-10-CM | POA: Diagnosis not present

## 2020-07-15 MED ORDER — CLINDAMYCIN HCL 300 MG PO CAPS
300.0000 mg | ORAL_CAPSULE | Freq: Three times a day (TID) | ORAL | 0 refills | Status: DC
Start: 2020-07-15 — End: 2020-09-23

## 2020-07-15 NOTE — Progress Notes (Signed)
Nathan Waters, [place] [address]   CLINIC:  Medical Oncology/Hematology  PCP:  Sofie Rower, PA-C [address] [place] [address]  [phone]  REASON FOR VISIT:  Follow-up for Hodgkin's lymphoma  PRIOR THERAPY: R-CHOP x 6 cycles from 10/22/2019 to 02/04/2020  CURRENT THERAPY: Observation  INTERVAL HISTORY:  [name], a 39 y.o. male, returns for routine follow-up for his Hodgkin's lymphoma. Nathan Waters was last seen on 03/30/2020.  Today he reports feeling well. He complains of having pain in his teeth due to a possible infection, and he will see a dentist soon. He denies feeling any more cysts on his right thigh. His numbness in his fingers has resolved and his feeling has returned. He denies having leg swelling, F/C, night sweats, or itching after a hot shower. His energy and appetite are back to 100%.   REVIEW OF SYSTEMS:  Review of Systems  Constitutional: Negative for appetite change, chills, diaphoresis, fatigue and fever.  Cardiovascular: Negative for leg swelling.  Skin: Negative for itching.  Neurological: Negative for numbness.  Hematological: Negative for adenopathy.  All other systems reviewed and are negative.   PAST MEDICAL/SURGICAL HISTORY:  Past Medical History:  Diagnosis Date  . Asthma   . Coronary atherosclerosis of native coronary artery    BMS to mid circumflex 11/2011, LVEF 55-60%  . Essential hypertension   . Migraine   . Mixed hyperlipidemia   . STEMI (ST elevation myocardial infarction) ([place])    Complicated by VF arrest 11/2011   Past Surgical History:  Procedure Laterality Date  . AXILLARY LYMPH NODE BIOPSY Left 07/26/2019   Procedure: AXILLARY LYMPH NODE BIOPSY;  Surgeon: Aviva Signs, MD;  Location: [facility];  Service: General;  Laterality: Left;  . BACK SURGERY    . CARDIAC CATHETERIZATION    . CHOLECYSTECTOMY N/A 08/26/2019   Procedure: LAPAROSCOPIC CHOLECYSTECTOMY;  Surgeon:  Aviva Signs, MD;  Location: [facility];  Service: General;  Laterality: N/A;  . LEFT HEART CATHETERIZATION WITH CORONARY ANGIOGRAM N/A 11/21/2011   Procedure: LEFT HEART CATHETERIZATION WITH CORONARY ANGIOGRAM;  Surgeon: Peter M Martinique, MD;  Location: [facility] CATH LAB;  Service: Cardiovascular;  Laterality: N/A;  . LYMPH NODE BIOPSY Right 08/26/2019   Procedure: LYMPH NODE BIOPSY, INGUINAL;  Surgeon: Aviva Signs, MD;  Location: [facility];  Service: General;  Laterality: Right;  . PERCUTANEOUS CORONARY STENT INTERVENTION (PCI-S) N/A 11/21/2011   Procedure: PERCUTANEOUS CORONARY STENT INTERVENTION (PCI-S);  Surgeon: Peter M Martinique, MD;  Location: [facility] CATH LAB;  Service: Cardiovascular;  Laterality: N/A;  . PORTACATH PLACEMENT Left 10/01/2019   Procedure: INSERTION PORT-A-CATH (catheter attached left subclavian);  Surgeon: Aviva Signs, MD;  Location: [facility];  Service: General;  Laterality: Left;  . SPLENECTOMY      SOCIAL HISTORY:  Social History   Socioeconomic History  . Marital status: Divorced    Spouse name: Not on file  . Number of children: 2  . Years of education: Not on file  . Highest education level: Not on file  Occupational History  . Occupation: not employed  Tobacco Use  . Smoking status: Current Every Day Smoker    Packs/day: 1.00    Types: Cigarettes  . Smokeless tobacco: Never Used  Vaping Use  . Vaping Use: Former  . Start date: 04/27/2012  Substance and Sexual Activity  . Alcohol use: Yes    Comment: Occasionally  . Drug use: No  . Sexual activity: Yes  Birth control/protection: Rhythm  Other Topics Concern  . Not on file  Social History Narrative  . Not on file   Social Determinants of Health   Financial Resource Strain:   . Difficulty of Paying Living Expenses: Not on file  Food Insecurity:   . Worried About Charity fundraiser in the Last Year: Not on file  . Ran Out of Food in the Last Year: Not on file  Transportation Needs:   . Lack of Transportation  (Medical): Not on file  . Lack of Transportation (Non-Medical): Not on file  Physical Activity:   . Days of Exercise per Week: Not on file  . Minutes of Exercise per Session: Not on file  Stress:   . Feeling of Stress : Not on file  Social Connections:   . Frequency of Communication with Friends and Family: Not on file  . Frequency of Social Gatherings with Friends and Family: Not on file  . Attends Religious Services: Not on file  . Active Member of Clubs or Organizations: Not on file  . Attends Archivist Meetings: Not on file  . Marital Status: Not on file  Intimate Partner Violence:   . Fear of Current or Ex-Partner: Not on file  . Emotionally Abused: Not on file  . Physically Abused: Not on file  . Sexually Abused: Not on file    FAMILY HISTORY:  Family History  Problem Relation Age of Onset  . Malignant hyperthermia Mother   . Cancer Mother   . Malignant hyperthermia Brother   . Heart disease Brother   . Asthma Brother   . COPD Brother   . Heart disease Father   . Cancer Father   . ADD / ADHD Daughter   . ADD / ADHD Son     CURRENT MEDICATIONS:  Current Outpatient Medications  Medication Sig Dispense Refill  . albuterol (PROVENTIL HFA;VENTOLIN HFA) 108 (90 Base) MCG/ACT inhaler Inhale 2 puffs into the lungs every 6 (six) hours as needed for wheezing or shortness of breath.     . cetirizine (ZYRTEC) 10 MG tablet Take 10 mg by mouth daily as needed for allergies.     . K Phos Mono-Sod Phos Di & Mono (PHOSPHA 250 NEUTRAL) [id number] MG TABS Take 1 tablet by mouth 2 (two) times daily. 120 tablet 0  . clindamycin (CLEOCIN) 300 MG capsule Take 1 capsule (300 mg total) by mouth 3 (three) times daily. 21 capsule 0  . lidocaine-prilocaine (EMLA) cream Apply a small amount to port a cath site and cover with plastic wrap 1 hour prior to chemotherapy appointments (Patient not taking: Reported on 07/15/2020) 30 g 0  . nitroGLYCERIN (NITROSTAT) 0.4 MG SL tablet Place 1  tablet (0.4 mg total) under the tongue every 5 (five) minutes as needed for chest pain. (Patient not taking: Reported on 07/15/2020) 25 tablet 3  . prochlorperazine (COMPAZINE) 10 MG tablet Take 1 tablet (10 mg total) by mouth every 6 (six) hours as needed for nausea or vomiting. (Patient not taking: Reported on 07/15/2020) 30 tablet 0   No current facility-administered medications for this visit.    ALLERGIES:  Allergies  Allergen Reactions  . Penicillins Anaphylaxis    Did it involve swelling of the face/tongue/throat, SOB, or low BP? Yes Did it involve sudden or severe rash/hives, skin peeling, or any reaction on the inside of your mouth or nose? No Did you need to seek medical attention at a hospital or doctor's office? Yes When did  it last happen?Childhood allergy If all above answers are "NO", may proceed with cephalosporin use.     PHYSICAL EXAM:  Performance status (ECOG): 0 - Asymptomatic  Vitals:   07/15/20 1558  BP: 131/67  Pulse: 86  Resp: 18  Temp: (!) 97.5 F (36.4 C)  SpO2: 96%   Wt Readings from Last 3 Encounters:  07/15/20 288 lb 9.6 oz (130.9 kg)  03/30/20 275 lb 3.2 oz (124.8 kg)  02/28/20 260 lb (117.9 kg)   Physical Exam Vitals reviewed.  Constitutional:      Appearance: Normal appearance.  Cardiovascular:     Rate and Rhythm: Normal rate and regular rhythm.     Pulses: Normal pulses.     Heart sounds: Normal heart sounds.  Pulmonary:     Effort: Pulmonary effort is normal.     Breath sounds: Normal breath sounds.  Abdominal:     Palpations: Abdomen is soft. There is no hepatomegaly, splenomegaly or mass.     Tenderness: There is no abdominal tenderness.     Hernia: No hernia is present.  Musculoskeletal:     Right lower leg: Edema (trace) present.     Left lower leg: Edema (trace) present.  Lymphadenopathy:     Cervical: No cervical adenopathy.     Upper Body:     Right upper body: No axillary or pectoral adenopathy.     Left upper  body: No axillary or pectoral adenopathy.     Lower Body: No right inguinal adenopathy. No left inguinal adenopathy.  Neurological:     General: No focal deficit present.     Mental Status: He is alert and oriented to person, place, and time.  Psychiatric:        Mood and Affect: Mood normal.        Behavior: Behavior normal.     LABORATORY DATA:  I have reviewed the labs as listed.  CBC Latest Ref Rng & Units 04/29/2020 03/25/2020 02/04/2020  WBC 4.0 - 10.5 K/uL 9.6 6.9 7.2  Hemoglobin 13.0 - 17.0 g/dL 15.1 15.0 12.6(L)  Hematocrit 39 - 52 % 43.9 45.9 38.4(L)  Platelets 150 - 400 K/uL 292 250 447(H)   CMP Latest Ref Rng & Units 04/29/2020 03/25/2020 02/04/2020  Glucose 70 - 99 mg/dL 118(H) 127(H) 141(H)  BUN 6 - 20 mg/dL 11 10 10   Creatinine 0.61 - 1.24 mg/dL 0.77 0.80 0.79  Sodium 135 - 145 mmol/L 139 138 140  Potassium 3.5 - 5.1 mmol/L 3.5 4.5 3.5  Chloride 98 - 111 mmol/L 110 106 109  CO2 22 - 32 mmol/L 24 25 25   Calcium 8.9 - 10.3 mg/dL 8.6(L) 8.9 8.8(L)  Total Protein 6.5 - 8.1 g/dL 6.6 6.7 6.4(L)  Total Bilirubin 0.3 - 1.2 mg/dL 0.5 0.4 0.4  Alkaline Phos 38 - 126 U/L 96 94 80  AST 15 - 41 U/L 29 31 19   ALT 0 - 44 U/L 44 41 29      Component Value Date/Time   RBC 4.42 04/29/2020 1438   MCV 99.3 04/29/2020 1438   MCH 34.2 (H) 04/29/2020 1438   MCHC 34.4 04/29/2020 1438   RDW 14.1 04/29/2020 1438   LYMPHSABS 2.2 04/29/2020 1438   MONOABS 1.0 04/29/2020 1438   EOSABS 0.5 04/29/2020 1438   BASOSABS 0.0 04/29/2020 1438    DIAGNOSTIC IMAGING:  I have independently reviewed the scans and discussed with the patient. No results found.   ASSESSMENT:  1. NLP Hodgkin's disease: -6 cycles of R-CHOP from  10/23/2019 through 02/04/2020. -PET scan on 03/25/2020 showed most of the lymph nodes with Deauville 2 activity with normal size limits.  2 of the right external iliac lymph nodes remain mildly enlarged.  Right thigh mass also normalized.   PLAN:  1. NLP Hodgkin's disease: -I  have reviewed his labs from 04/29/2020 which were normal. -No palpable adenopathy.  No B symptoms. -RTC 2 months with repeat PET scan and labs.  2. Peripheral neuropathy: -This has completely resolved at this time.  Orders placed this encounter:  No orders of the defined types were placed in this encounter.    Derek Jack, MD [place] [phone]   I, Milinda Antis, am acting as a scribe for Dr. Sanda Linger.  I, Derek Jack MD, have reviewed the above documentation for accuracy and completeness, and I agree with the above.

## 2020-07-15 NOTE — Patient Instructions (Signed)
[place] at [facility] Discharge Instructions  You were seen today by Dr. Delton Coombes. He went over your recent results. You will have a PET scan done in the first week of December. Dr. Delton Coombes will see you back after the PET for labs and follow up.   Thank you for choosing [place] at [facility] to provide your oncology and hematology care.  To afford each patient quality time with our provider, please arrive at least 15 minutes before your scheduled appointment time.   If you have a lab appointment with the [place] please come in thru the Main Entrance and check in at the main information desk  You need to re-schedule your appointment should you arrive 10 or more minutes late.  We strive to give you quality time with our providers, and arriving late affects you and other patients whose appointments are after yours.  Also, if you no show three or more times for appointments you may be dismissed from the clinic at the providers discretion.     Again, thank you for choosing [facility].  Our hope is that these requests will decrease the amount of time that you wait before being seen by our physicians.       _____________________________________________________________  Should you have questions after your visit to [facility], please contact our office at [phone] between the hours of 8:00 a.m. and 4:30 p.m.  Voicemails left after 4:00 p.m. will not be returned until the following business day.  For prescription refill requests, have your pharmacy contact our office and allow 72 hours.    Cancer Center Support Programs:   > Cancer Support Group  2nd Tuesday of the month 1pm-2pm, Journey Room

## 2020-07-16 MED ORDER — CYANOCOBALAMIN 1000 MCG/ML IJ SOLN
INTRAMUSCULAR | Status: AC
  Filled 2020-07-16: qty 1

## 2020-09-03 IMAGING — US ULTRASOUND ABDOMEN COMPLETE
1 series · 13 of 25 positions shown · non-contrast
Comparison: CT of the abdomen and pelvis earlier today.

CLINICAL DATA: Nausea, vomiting and epigastric abdominal pain.
History prior splenectomy. CT demonstrates cholelithiasis with
possible gallbladder inflammation.

EXAM:
ABDOMEN ULTRASOUND COMPLETE

[Series 1: ultrasound abdomen complete · 13 of 121 slices shown]
[im 1/121]
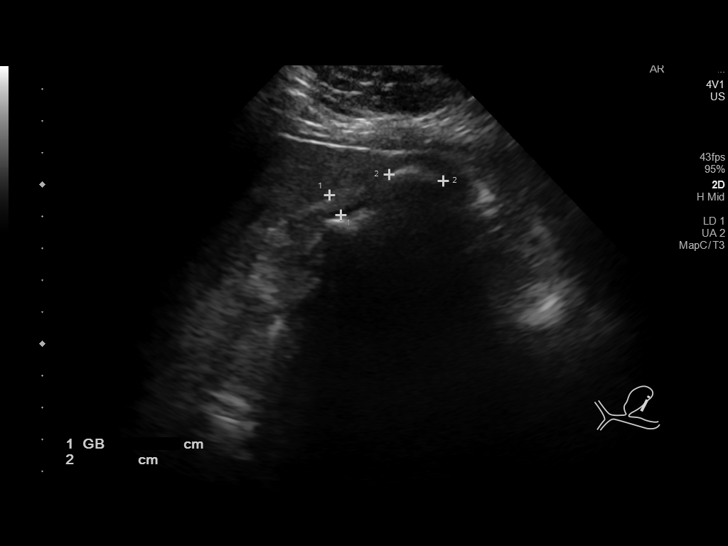
[im 11/121]
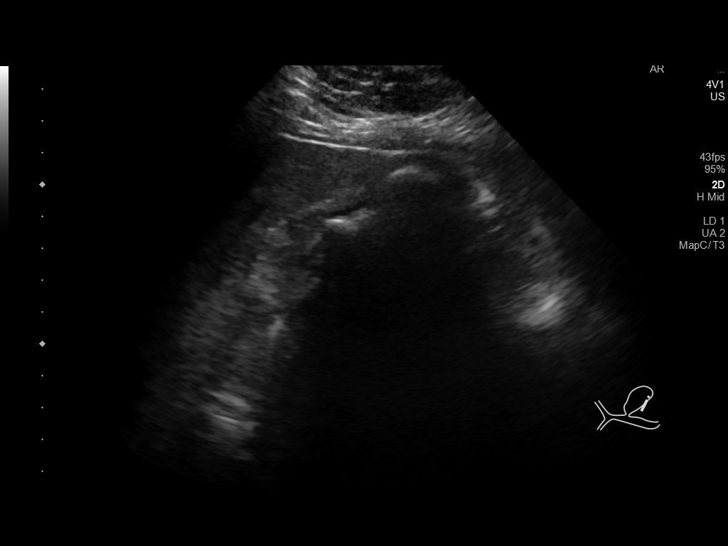
[im 21/121]
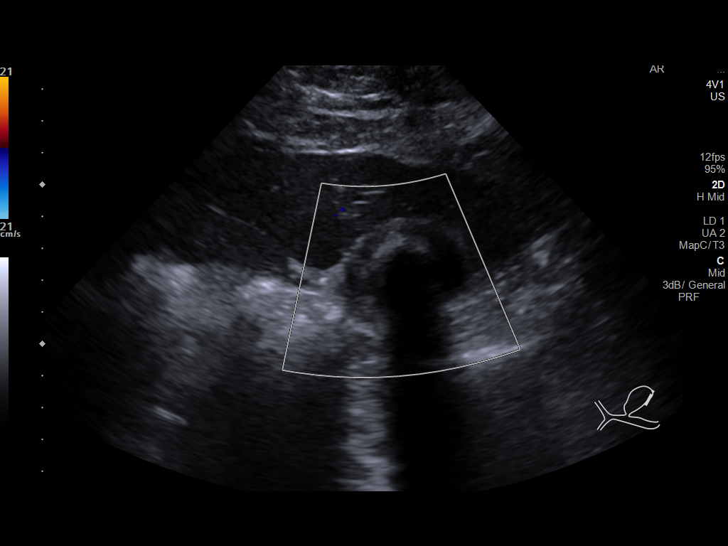
[im 31/121]
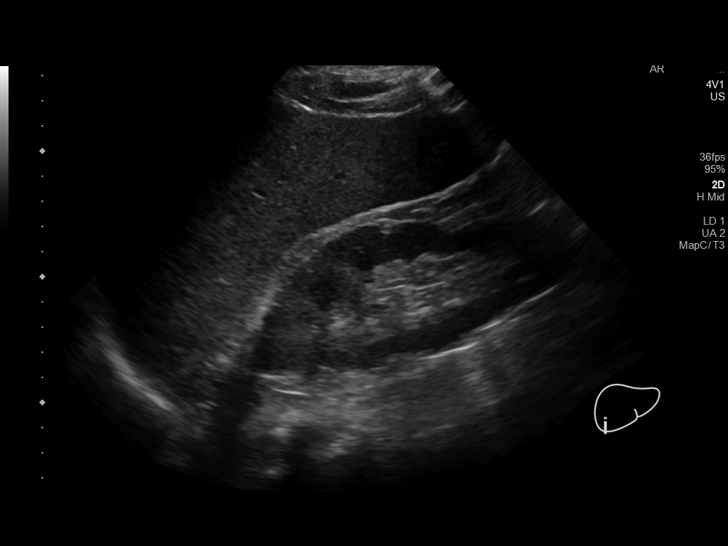
[im 41/121]
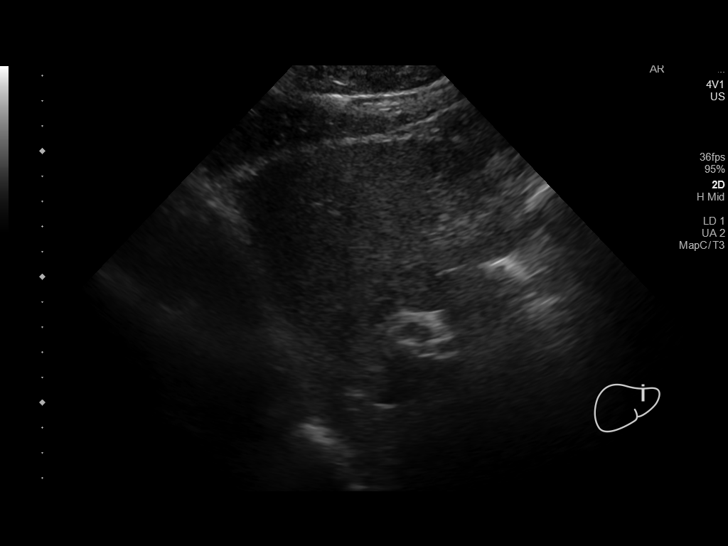
[im 51/121]
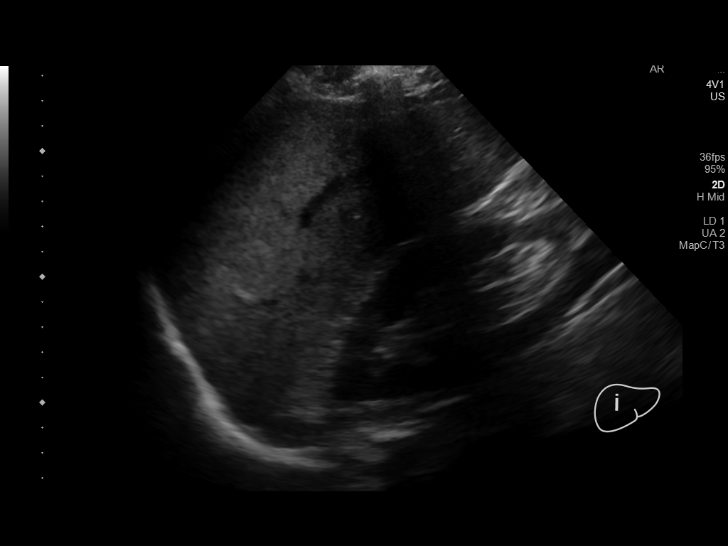
[im 61/121]
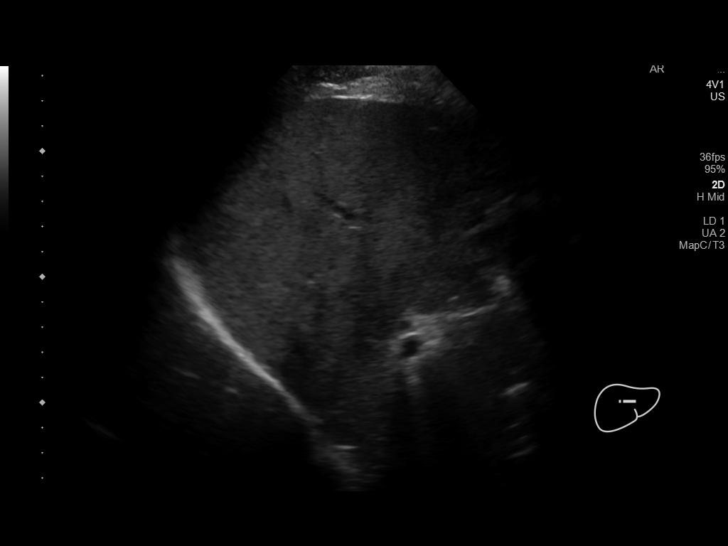
[im 71/121]
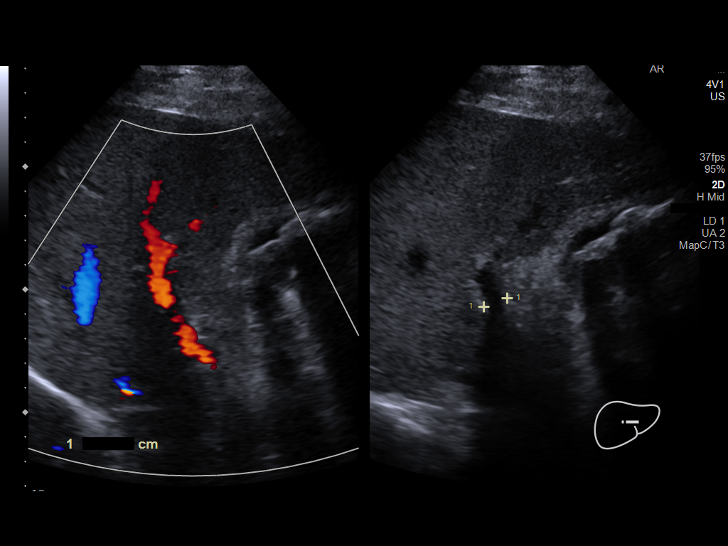
[im 81/121]
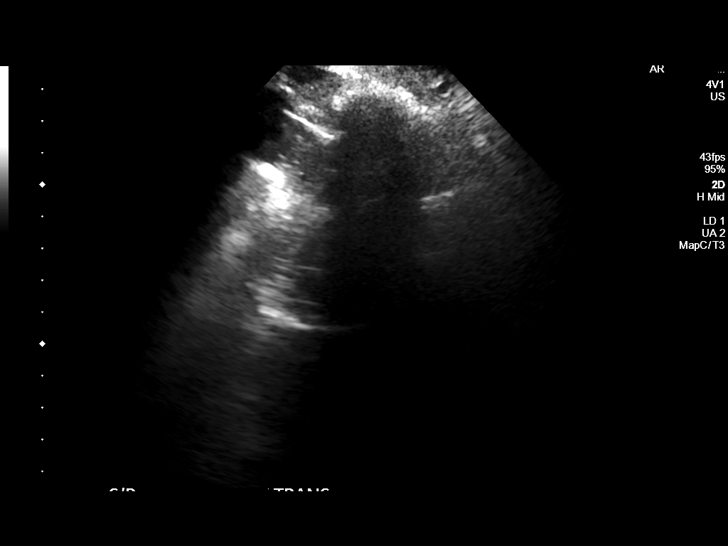
[im 91/121]
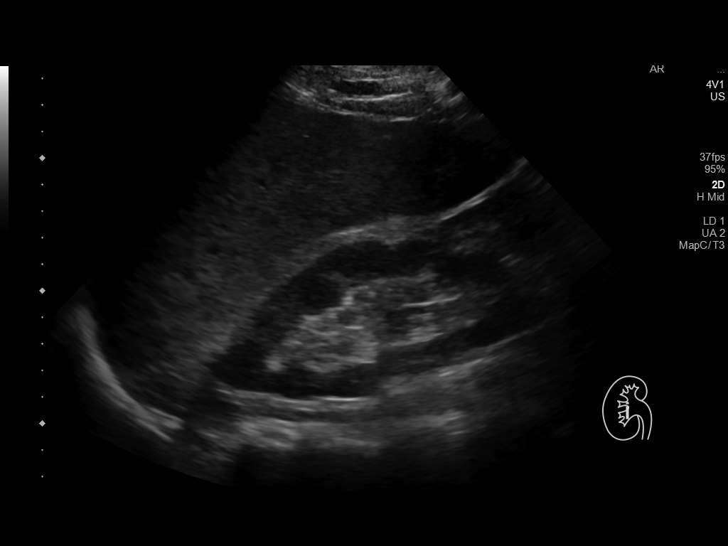
[im 101/121]
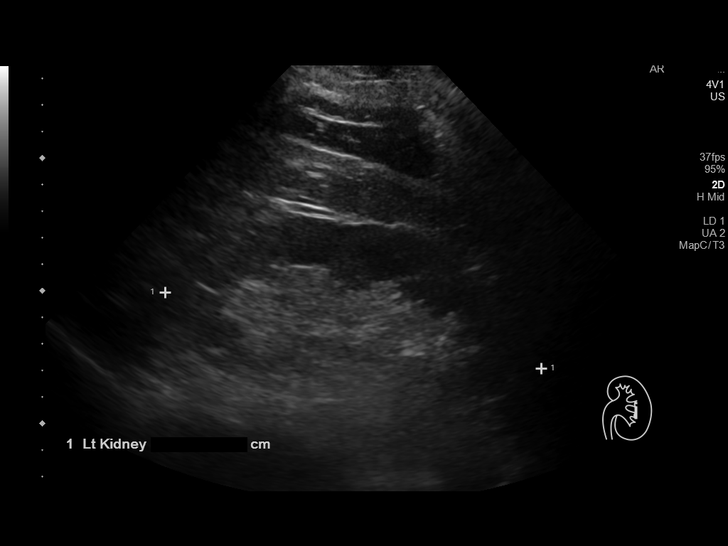
[im 111/121]
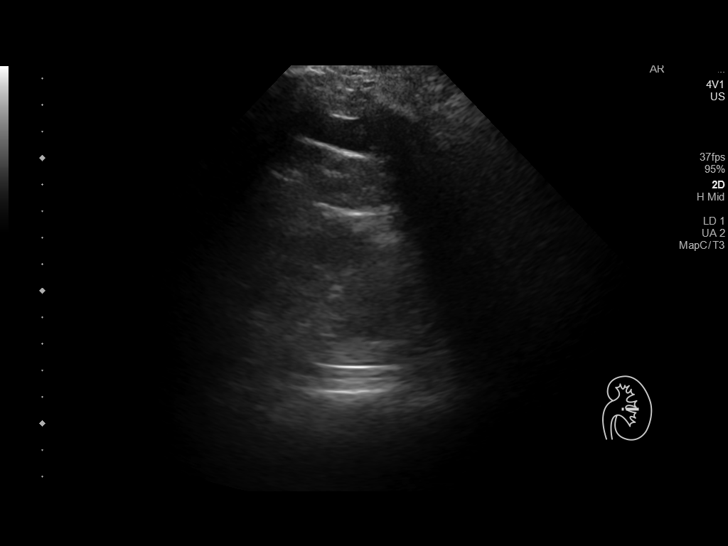
[im 121/121]
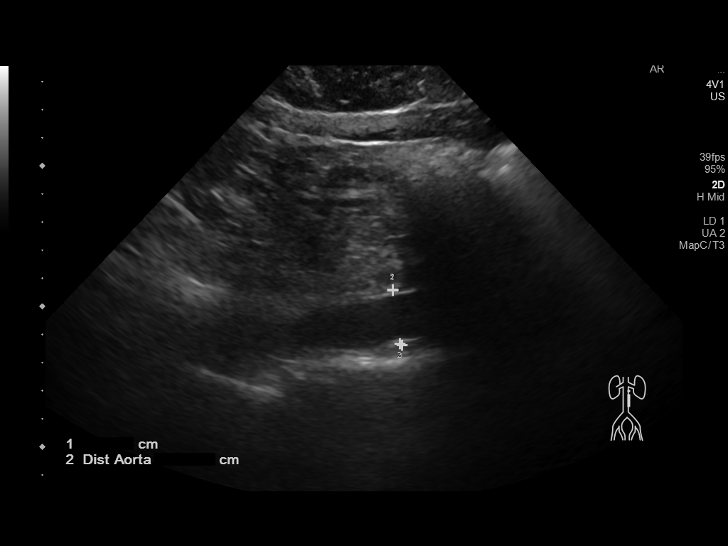

[13 of 25 positions shown; findings below may reference images not displayed]

FINDINGS: Gallbladder: Multiple large shadowing gallstones are present within
the gallbladder. The gallbladder wall appears likely thickened
measuring 6-7 mm. No evidence of overt sonographic Murphy sign at
the time of the study or pericholecystic fluid.

Common bile duct: Diameter: 6 mm.

Liver: The liver demonstrates coarse echotexture and increased
echogenicity, likely reflecting diffuse steatosis. No overt
cirrhotic contour abnormalities or focal lesions are identified.
There is no evidence of intrahepatic biliary ductal dilatation. The
portal vein is open. Portal vein is patent on color Doppler imaging
with normal direction of blood flow towards the liver.

IVC: No abnormality visualized.

Pancreas: Visualized portion unremarkable.

Spleen: Surgically absent.

Right Kidney: Length: 13.9 cm. Echogenicity within normal limits. No
mass or hydronephrosis visualized.

Left Kidney: Length: 14.5 cm. Echogenicity within normal limits. No
mass or hydronephrosis visualized.

Abdominal aorta: No aneurysm visualized.

Other findings: No ascites or focal fluid collections identified by
ultrasound.
IMPRESSION: 1. Cholelithiasis with large gallstones in the gallbladder lumen.
The gallbladder wall is thickened. No evidence of biliary ductal
dilatation.
2. Hepatic steatosis.

## 2020-09-21 ENCOUNTER — Other Ambulatory Visit: Payer: Self-pay

## 2020-09-21 ENCOUNTER — Inpatient Hospital Stay (HOSPITAL_COMMUNITY): Payer: Medicaid Other | Attending: Hematology and Oncology

## 2020-09-21 ENCOUNTER — Other Ambulatory Visit (HOSPITAL_COMMUNITY): Payer: Medicaid Other

## 2020-09-21 ENCOUNTER — Encounter (HOSPITAL_COMMUNITY)
Admission: RE | Admit: 2020-09-21 | Discharge: 2020-09-21 | Disposition: A | Discharge status: Home / Self Care | Payer: Medicaid Other | Source: Ambulatory Visit | Attending: Hematology | Admitting: Hematology

## 2020-09-21 DIAGNOSIS — F17210 Nicotine dependence, cigarettes, uncomplicated: Secondary | ICD-10-CM | POA: Insufficient documentation

## 2020-09-21 DIAGNOSIS — Z8571 Personal history of Hodgkin lymphoma: Secondary | ICD-10-CM | POA: Insufficient documentation

## 2020-09-21 DIAGNOSIS — C8108 Nodular lymphocyte predominant Hodgkin lymphoma, lymph nodes of multiple sites: Secondary | ICD-10-CM

## 2020-09-21 MED ORDER — FLUDEOXYGLUCOSE F - 18 (FDG) INJECTION
14.3700 | Freq: Once | INTRAVENOUS | Status: AC | PRN
  Administered 2020-09-21: 14.37 via INTRAVENOUS

## 2020-09-23 ENCOUNTER — Other Ambulatory Visit (HOSPITAL_COMMUNITY): Payer: Self-pay | Admitting: *Deleted

## 2020-09-23 ENCOUNTER — Other Ambulatory Visit: Payer: Self-pay

## 2020-09-23 ENCOUNTER — Encounter (HOSPITAL_COMMUNITY): Payer: Self-pay | Admitting: Hematology and Oncology

## 2020-09-23 ENCOUNTER — Inpatient Hospital Stay (HOSPITAL_COMMUNITY): Payer: Medicaid Other

## 2020-09-23 ENCOUNTER — Inpatient Hospital Stay (HOSPITAL_BASED_OUTPATIENT_CLINIC_OR_DEPARTMENT_OTHER): Payer: Medicaid Other | Admitting: Hematology and Oncology

## 2020-09-23 VITALS — BP 120/83 | HR 79 | Temp 97.0°F | Resp 18 | Wt 286.2 lb

## 2020-09-23 DIAGNOSIS — C8108 Nodular lymphocyte predominant Hodgkin lymphoma, lymph nodes of multiple sites: Secondary | ICD-10-CM

## 2020-09-23 DIAGNOSIS — Z8571 Personal history of Hodgkin lymphoma: Secondary | ICD-10-CM | POA: Diagnosis present

## 2020-09-23 DIAGNOSIS — F17210 Nicotine dependence, cigarettes, uncomplicated: Secondary | ICD-10-CM | POA: Diagnosis not present

## 2020-09-23 LAB — COMPREHENSIVE METABOLIC PANEL
ALT: 39 U/L (ref 0–44)
AST: 26 U/L (ref 15–41)
Albumin: 4.2 g/dL (ref 3.5–5.0)
Alkaline Phosphatase: 96 U/L (ref 38–126)
Anion gap: 7 (ref 5–15)
BUN: 8 mg/dL (ref 6–20)
CO2: 27 mmol/L (ref 22–32)
Calcium: 9.8 mg/dL (ref 8.9–10.3)
Chloride: 105 mmol/L (ref 98–111)
Creatinine, Ser: 0.91 mg/dL (ref 0.61–1.24)
GFR, Estimated: >60 mL/min (ref >60)
Glucose, Bld: 125 mg/dL — ABNORMAL HIGH (ref 70–99)
Potassium: 4.7 mmol/L (ref 3.5–5.1)
Sodium: 139 mmol/L (ref 135–145)
Total Bilirubin: 0.6 mg/dL (ref 0.3–1.2)
Total Protein: 7.2 g/dL (ref 6.5–8.1)

## 2020-09-23 LAB — CBC WITH DIFFERENTIAL/PLATELET
Abs Immature Granulocytes: 0.02 10*3/uL (ref 0.00–0.07)
Basophils Absolute: 0 10*3/uL (ref 0.0–0.1)
Basophils Relative: 1 %
Eosinophils Absolute: 0.3 10*3/uL (ref 0.0–0.5)
Eosinophils Relative: 3 %
HCT: 47.5 % (ref 39.0–52.0)
Hemoglobin: 16 g/dL (ref 13.0–17.0)
Immature Granulocytes: 0 %
Lymphocytes Relative: 20 %
Lymphs Abs: 1.7 10*3/uL (ref 0.7–4.0)
MCH: 33.9 pg (ref 26.0–34.0)
MCHC: 33.7 g/dL (ref 30.0–36.0)
MCV: 100.6 fL — ABNORMAL HIGH (ref 80.0–100.0)
Monocytes Absolute: 0.7 10*3/uL (ref 0.1–1.0)
Monocytes Relative: 9 %
Neutro Abs: 5.7 10*3/uL (ref 1.7–7.7)
Neutrophils Relative %: 67 %
Platelets: 324 10*3/uL (ref 150–400)
RBC: 4.72 MIL/uL (ref 4.22–5.81)
RDW: 14.7 % (ref 11.5–15.5)
WBC: 8.4 10*3/uL (ref 4.0–10.5)
nRBC: 0 % (ref 0.0–0.2)

## 2020-09-23 LAB — LACTATE DEHYDROGENASE: LDH: 103 U/L (ref 98–192)

## 2020-09-23 LAB — SEDIMENTATION RATE: Sed Rate: 7 mm/hr (ref 0–16)

## 2020-09-23 NOTE — Progress Notes (Signed)
[place], [place] [address]   CLINIC:  Medical Oncology/Hematology  PCP:  Sofie Rower, PA-C [place] [address] [place] [address]  [phone]  REASON FOR VISIT:  Follow-up for Hodgkin's lymphoma  PRIOR THERAPY: R-CHOP x 6 cycles from 10/22/2019 to 02/04/2020  CURRENT THERAPY: Observation  INTERVAL HISTORY:  [name], a 39 y.o. male, returns for routine follow-up for his Hodgkin's lymphoma. Nathan Waters was last seen on 03/30/2020.  Today he reports feeling well.  He denies any B symptoms No chest pain, SOB. He had a heart attack in his 20's and hence keeps nitrostat with him. No change in bowel or urinary habits He denies any recent infections.  REVIEW OF SYSTEMS:  Review of Systems  Constitutional: Negative for appetite change, chills, diaphoresis, fatigue and fever.  Cardiovascular: Negative for leg swelling.  Skin: Negative for itching.  Neurological: Negative for numbness.  Hematological: Negative for adenopathy.  All other systems reviewed and are negative.   PAST MEDICAL/SURGICAL HISTORY:  Past Medical History:  Diagnosis Date  . Asthma   . Coronary atherosclerosis of native coronary artery    BMS to mid circumflex 11/2011, LVEF 55-60%  . Essential hypertension   . Migraine   . Mixed hyperlipidemia   . STEMI (ST elevation myocardial infarction) ([place])    Complicated by VF arrest 11/2011   Past Surgical History:  Procedure Laterality Date  . AXILLARY LYMPH NODE BIOPSY Left 07/26/2019   Procedure: AXILLARY LYMPH NODE BIOPSY;  Surgeon: Aviva Signs, MD;  Location: [facility];  Service: General;  Laterality: Left;  . BACK SURGERY    . CARDIAC CATHETERIZATION    . CHOLECYSTECTOMY N/A 08/26/2019   Procedure: LAPAROSCOPIC CHOLECYSTECTOMY;  Surgeon: Aviva Signs, MD;  Location: [facility];  Service: General;  Laterality: N/A;  . LEFT HEART CATHETERIZATION WITH CORONARY ANGIOGRAM N/A 11/21/2011   Procedure: LEFT HEART  CATHETERIZATION WITH CORONARY ANGIOGRAM;  Surgeon: Peter M Martinique, MD;  Location: [facility] CATH LAB;  Service: Cardiovascular;  Laterality: N/A;  . LYMPH NODE BIOPSY Right 08/26/2019   Procedure: LYMPH NODE BIOPSY, INGUINAL;  Surgeon: Aviva Signs, MD;  Location: [facility];  Service: General;  Laterality: Right;  . PERCUTANEOUS CORONARY STENT INTERVENTION (PCI-S) N/A 11/21/2011   Procedure: PERCUTANEOUS CORONARY STENT INTERVENTION (PCI-S);  Surgeon: Peter M Martinique, MD;  Location: [facility] CATH LAB;  Service: Cardiovascular;  Laterality: N/A;  . PORTACATH PLACEMENT Left 10/01/2019   Procedure: INSERTION PORT-A-CATH (catheter attached left subclavian);  Surgeon: Aviva Signs, MD;  Location: [facility];  Service: General;  Laterality: Left;  . SPLENECTOMY      SOCIAL HISTORY:  Social History   Socioeconomic History  . Marital status: Divorced    Spouse name: Not on file  . Number of children: 2  . Years of education: Not on file  . Highest education level: Not on file  Occupational History  . Occupation: not employed  Tobacco Use  . Smoking status: Current Every Day Smoker    Packs/day: 1.00    Types: Cigarettes  . Smokeless tobacco: Never Used  Vaping Use  . Vaping Use: Former  . Start date: 04/27/2012  Substance and Sexual Activity  . Alcohol use: Yes    Comment: Occasionally  . Drug use: No  . Sexual activity: Yes    Birth control/protection: Rhythm  Other Topics Concern  . Not on file  Social History Narrative  . Not on file   Social Determinants of  Health   Financial Resource Strain: Low Risk   . Difficulty of Paying Living Expenses: Not hard at all  Food Insecurity: No Food Insecurity  . Worried About Charity fundraiser in the Last Year: Never true  . Ran Out of Food in the Last Year: Never true  Transportation Needs: No Transportation Needs  . Lack of Transportation (Medical): No  . Lack of Transportation (Non-Medical): No  Physical Activity:   . Days of Exercise per Week: Not on  file  . Minutes of Exercise per Session: Not on file  Stress: No Stress Concern Present  . Feeling of Stress : Not at all  Social Connections: Socially Isolated  . Frequency of Communication with Friends and Family: More than three times a week  . Frequency of Social Gatherings with Friends and Family: Three times a week  . Attends Religious Services: Never  . Active Member of Clubs or Organizations: No  . Attends Archivist Meetings: Never  . Marital Status: Divorced  Human resources officer Violence: Not At Risk  . Fear of Current or Ex-Partner: No  . Emotionally Abused: No  . Physically Abused: No  . Sexually Abused: No    FAMILY HISTORY:  Family History  Problem Relation Age of Onset  . Malignant hyperthermia Mother   . Cancer Mother   . Malignant hyperthermia Brother   . Heart disease Brother   . Asthma Brother   . COPD Brother   . Heart disease Father   . Cancer Father   . ADD / ADHD Daughter   . ADD / ADHD Son     CURRENT MEDICATIONS:  Current Outpatient Medications  Medication Sig Dispense Refill  . albuterol (PROVENTIL HFA;VENTOLIN HFA) 108 (90 Base) MCG/ACT inhaler Inhale 2 puffs into the lungs every 6 (six) hours as needed for wheezing or shortness of breath.     . cetirizine (ZYRTEC) 10 MG tablet Take 10 mg by mouth daily as needed for allergies.     . clindamycin (CLEOCIN) 300 MG capsule Take 1 capsule (300 mg total) by mouth 3 (three) times daily. 21 capsule 0  . K Phos Mono-Sod Phos Di & Mono (PHOSPHA 250 NEUTRAL) [id number] MG TABS Take 1 tablet by mouth 2 (two) times daily. 120 tablet 0  . lidocaine-prilocaine (EMLA) cream Apply a small amount to port a cath site and cover with plastic wrap 1 hour prior to chemotherapy appointments (Patient not taking: Reported on 07/15/2020) 30 g 0  . nitroGLYCERIN (NITROSTAT) 0.4 MG SL tablet Place 1 tablet (0.4 mg total) under the tongue every 5 (five) minutes as needed for chest pain. (Patient not taking: Reported on  07/15/2020) 25 tablet 3  . prochlorperazine (COMPAZINE) 10 MG tablet Take 1 tablet (10 mg total) by mouth every 6 (six) hours as needed for nausea or vomiting. (Patient not taking: Reported on 07/15/2020) 30 tablet 0   No current facility-administered medications for this visit.    ALLERGIES:  Allergies  Allergen Reactions  . Penicillins Anaphylaxis    Did it involve swelling of the face/tongue/throat, SOB, or low BP? Yes Did it involve sudden or severe rash/hives, skin peeling, or any reaction on the inside of your mouth or nose? No Did you need to seek medical attention at a hospital or doctor's office? Yes When did it last happen?Childhood allergy If all above answers are "NO", may proceed with cephalosporin use.     PHYSICAL EXAM:  Performance status (ECOG): 0 - Asymptomatic  Vitals:  09/23/20 1502  BP: 120/83  Pulse: 79  Resp: 18  Temp: (!) 97 F (36.1 C)  SpO2: 97%   Wt Readings from Last 3 Encounters:  09/23/20 286 lb 3.2 oz (129.8 kg)  07/15/20 288 lb 9.6 oz (130.9 kg)  03/30/20 275 lb 3.2 oz (124.8 kg)   Physical Exam Vitals reviewed.  Constitutional:      Appearance: Normal appearance.  Cardiovascular:     Rate and Rhythm: Normal rate and regular rhythm.     Pulses: Normal pulses.     Heart sounds: Normal heart sounds.  Pulmonary:     Effort: Pulmonary effort is normal.     Breath sounds: Normal breath sounds.  Abdominal:     Palpations: Abdomen is soft. There is no hepatomegaly, splenomegaly or mass.     Tenderness: There is no abdominal tenderness.     Hernia: No hernia is present.  Musculoskeletal:     Right lower leg: Edema (trace) present.     Left lower leg: Edema (trace) present.  Lymphadenopathy:     Cervical: No cervical adenopathy.     Upper Body:     Right upper body: No axillary or pectoral adenopathy.     Left upper body: No axillary or pectoral adenopathy.     Lower Body: No right inguinal adenopathy. No left inguinal adenopathy.   Neurological:     General: No focal deficit present.     Mental Status: He is alert and oriented to person, place, and time.  Psychiatric:        Mood and Affect: Mood normal.        Behavior: Behavior normal.     LABORATORY DATA:  I have reviewed the labs as listed.  CBC Latest Ref Rng & Units 04/29/2020 03/25/2020 02/04/2020  WBC 4.0 - 10.5 K/uL 9.6 6.9 7.2  Hemoglobin 13.0 - 17.0 g/dL 15.1 15.0 12.6(L)  Hematocrit 39 - 52 % 43.9 45.9 38.4(L)  Platelets 150 - 400 K/uL 292 250 447(H)   CMP Latest Ref Rng & Units 04/29/2020 03/25/2020 02/04/2020  Glucose 70 - 99 mg/dL 118(H) 127(H) 141(H)  BUN 6 - 20 mg/dL 11 10 10   Creatinine 0.61 - 1.24 mg/dL 0.77 0.80 0.79  Sodium 135 - 145 mmol/L 139 138 140  Potassium 3.5 - 5.1 mmol/L 3.5 4.5 3.5  Chloride 98 - 111 mmol/L 110 106 109  CO2 22 - 32 mmol/L 24 25 25   Calcium 8.9 - 10.3 mg/dL 8.6(L) 8.9 8.8(L)  Total Protein 6.5 - 8.1 g/dL 6.6 6.7 6.4(L)  Total Bilirubin 0.3 - 1.2 mg/dL 0.5 0.4 0.4  Alkaline Phos 38 - 126 U/L 96 94 80  AST 15 - 41 U/L 29 31 19   ALT 0 - 44 U/L 44 41 29      Component Value Date/Time   RBC 4.42 04/29/2020 1438   MCV 99.3 04/29/2020 1438   MCH 34.2 (H) 04/29/2020 1438   MCHC 34.4 04/29/2020 1438   RDW 14.1 04/29/2020 1438   LYMPHSABS 2.2 04/29/2020 1438   MONOABS 1.0 04/29/2020 1438   EOSABS 0.5 04/29/2020 1438   BASOSABS 0.0 04/29/2020 1438    DIAGNOSTIC IMAGING:  I have independently reviewed the scans and discussed with the patient. NM PET Image Restag (PS) Skull Base To Thigh  Result Date: 09/22/2020 CLINICAL DATA:  Subsequent treatment strategy for Hodgkin's lymphoma. Chemotherapy x 6 months. EXAM: NUCLEAR MEDICINE PET SKULL BASE TO THIGH TECHNIQUE: 14.4 mCi F-18 FDG was injected intravenously. Full-ring PET imaging was  performed from the skull base to thigh after the radiotracer. CT data was obtained and used for attenuation correction and anatomic localization. Fasting blood glucose: 114 mg/dl  COMPARISON:  PET-CT 03/24/2020 FINDINGS: Mediastinal blood pool activity: SUV max 3.4 Liver activity: SUV max 4.8 NECK: Symmetric activity of the palatine tonsils, maximum SUV 6.9 on the left and 7.6 on the right (formerly 4.9 and 5.6, respectively), without an overtly masslike appearance. Left level III lymph node 0.6 cm in short axis on image 51 of series 3 (formerly 0.5 cm), maximum SUV 3.7 (Deauville 3), formerly 2.6. Incidental CT findings: Mild chronic right maxillary sinusitis. CHEST: Left axillary lymph node 0.8 cm in short axis on image 93 of series 3 (formerly 0.7 cm), maximum SUV 2.2 (Deauville 2), formerly 1.7. Distal esophageal activity is likely physiologic, maximum SUV 4.3, previously 3.1. Incidental CT findings: Left Port-A-Cath tip: SVC. Circumflex coronary artery atherosclerosis. In a region of prior mild scarring inferiorly in the right upper lobe, there is some increased nodularity or airspace opacity currently measuring 1.8 by 1.2 cm on image 118 of series 3, maximum SUV in this vicinity is 1.7. ABDOMEN/PELVIS: Right external iliac node 1.1 cm in short axis on image 292 of series 3 with maximum SUV 1.8, Deauville 2. This lesion is stable in size and was also previously Deauville 2. Another right external iliac node measures 1.6 cm in short axis on image 302 of series 3 (formerly 1.5 cm) with maximum SUV 2.3 (Deauville 2), previously 2.2. No newly enlarged or newly hypermetabolic lymph nodes are present. Incidental CT findings: Cholecystectomy.  Splenectomy. SKELETON: Low-grade skeletal activity without focal lesion, similar to prior. Incidental CT findings: Solid interbody fusion with posterolateral rod and pedicle screw fixation at L4-5. IMPRESSION: 1. Stable appearance of mildly prominent Deauville 2 right external iliac lymph nodes. 2. Mildly accentuated palatine tonsillar activity bilaterally, without an overly masslike appearance, may be incidental/physiologic. 3. New focal  nodularity/airspace opacity inferiorly in the right upper lobe with only very low-grade activity, probably inflammatory, merits surveillance. Electronically Signed   By: Van Clines M.D.   On: 09/22/2020 13:04     ASSESSMENT:   1. NLP Hodgkin's disease: -6 cycles of R-CHOP from 10/23/2019 through 02/04/2020. -PET scan on 03/25/2020 showed most of the lymph nodes with Deauville 2 activity with normal size limits.  2 of the right external iliac lymph nodes remain mildly enlarged.  Right thigh mass also normalized.   PLAN:  1. NLP Hodgkin's disease:  -No palpable adenopathy.  No B symptoms. -recent PET reviewed, no concerns for recurrence. - He is due for his labs, orders placed by Dr Raliegh Ip already. RTC in 3 months for labs and 6 months PET scan. PET not ordered.  2. Smoking cessation encouraged.  Orders placed this encounter:  No orders of the defined types were placed in this encounter.   Benay Pike MD

## 2020-12-22 ENCOUNTER — Inpatient Hospital Stay (HOSPITAL_COMMUNITY): Payer: Medicaid Other | Attending: Hematology

## 2020-12-22 ENCOUNTER — Other Ambulatory Visit: Payer: Self-pay

## 2020-12-22 DIAGNOSIS — F17210 Nicotine dependence, cigarettes, uncomplicated: Secondary | ICD-10-CM | POA: Diagnosis not present

## 2020-12-22 DIAGNOSIS — Z452 Encounter for adjustment and management of vascular access device: Secondary | ICD-10-CM | POA: Diagnosis not present

## 2020-12-22 DIAGNOSIS — Z818 Family history of other mental and behavioral disorders: Secondary | ICD-10-CM | POA: Insufficient documentation

## 2020-12-22 DIAGNOSIS — G629 Polyneuropathy, unspecified: Secondary | ICD-10-CM | POA: Insufficient documentation

## 2020-12-22 DIAGNOSIS — C8108 Nodular lymphocyte predominant Hodgkin lymphoma, lymph nodes of multiple sites: Secondary | ICD-10-CM

## 2020-12-22 DIAGNOSIS — Z8249 Family history of ischemic heart disease and other diseases of the circulatory system: Secondary | ICD-10-CM | POA: Diagnosis not present

## 2020-12-22 DIAGNOSIS — Z809 Family history of malignant neoplasm, unspecified: Secondary | ICD-10-CM | POA: Insufficient documentation

## 2020-12-22 DIAGNOSIS — Z836 Family history of other diseases of the respiratory system: Secondary | ICD-10-CM | POA: Insufficient documentation

## 2020-12-22 DIAGNOSIS — Z79899 Other long term (current) drug therapy: Secondary | ICD-10-CM | POA: Insufficient documentation

## 2020-12-22 LAB — CBC WITH DIFFERENTIAL/PLATELET
Abs Immature Granulocytes: 0.03 10*3/uL (ref 0.00–0.07)
Basophils Absolute: 0.1 10*3/uL (ref 0.0–0.1)
Basophils Relative: 1 %
Eosinophils Absolute: 0.4 10*3/uL (ref 0.0–0.5)
Eosinophils Relative: 3 %
HCT: 45 % (ref 39.0–52.0)
Hemoglobin: 15 g/dL (ref 13.0–17.0)
Immature Granulocytes: 0 %
Lymphocytes Relative: 25 %
Lymphs Abs: 2.8 10*3/uL (ref 0.7–4.0)
MCH: 33.5 pg (ref 26.0–34.0)
MCHC: 33.3 g/dL (ref 30.0–36.0)
MCV: 100.4 fL — ABNORMAL HIGH (ref 80.0–100.0)
Monocytes Absolute: 1.1 10*3/uL — ABNORMAL HIGH (ref 0.1–1.0)
Monocytes Relative: 10 %
Neutro Abs: 6.7 10*3/uL (ref 1.7–7.7)
Neutrophils Relative %: 61 %
Platelets: 309 10*3/uL (ref 150–400)
RBC: 4.48 MIL/uL (ref 4.22–5.81)
RDW: 14.5 % (ref 11.5–15.5)
WBC: 11 10*3/uL — ABNORMAL HIGH (ref 4.0–10.5)
nRBC: 0 % (ref 0.0–0.2)

## 2020-12-22 LAB — COMPREHENSIVE METABOLIC PANEL
ALT: 39 U/L (ref 0–44)
AST: 27 U/L (ref 15–41)
Albumin: 3.9 g/dL (ref 3.5–5.0)
Alkaline Phosphatase: 105 U/L (ref 38–126)
Anion gap: 8 (ref 5–15)
BUN: 8 mg/dL (ref 6–20)
CO2: 21 mmol/L — ABNORMAL LOW (ref 22–32)
Calcium: 8.9 mg/dL (ref 8.9–10.3)
Chloride: 110 mmol/L (ref 98–111)
Creatinine, Ser: 0.77 mg/dL (ref 0.61–1.24)
GFR, Estimated: >60 mL/min (ref >60)
Glucose, Bld: 141 mg/dL — ABNORMAL HIGH (ref 70–99)
Potassium: 3.6 mmol/L (ref 3.5–5.1)
Sodium: 139 mmol/L (ref 135–145)
Total Bilirubin: 0.5 mg/dL (ref 0.3–1.2)
Total Protein: 6.6 g/dL (ref 6.5–8.1)

## 2020-12-22 LAB — LACTATE DEHYDROGENASE: LDH: 124 U/L (ref 98–192)

## 2020-12-22 LAB — SEDIMENTATION RATE: Sed Rate: 20 mm/hr — ABNORMAL HIGH (ref 0–16)

## 2020-12-29 ENCOUNTER — Inpatient Hospital Stay (HOSPITAL_BASED_OUTPATIENT_CLINIC_OR_DEPARTMENT_OTHER): Payer: Medicaid Other | Admitting: Hematology

## 2020-12-29 ENCOUNTER — Other Ambulatory Visit: Payer: Self-pay

## 2020-12-29 VITALS — BP 124/89 | HR 86 | Temp 98.1°F | Resp 17 | Wt 281.2 lb

## 2020-12-29 DIAGNOSIS — C8108 Nodular lymphocyte predominant Hodgkin lymphoma, lymph nodes of multiple sites: Secondary | ICD-10-CM | POA: Diagnosis not present

## 2020-12-29 DIAGNOSIS — C8190 Hodgkin lymphoma, unspecified, unspecified site: Secondary | ICD-10-CM

## 2020-12-29 NOTE — Progress Notes (Signed)
[place], [place] [address]   CLINIC:  Medical Oncology/Hematology  PCP:  Nathan Rower, PA-C [place] [address] [place] [address]  [phone]  REASON FOR VISIT:  Follow-up for Hodgkin's lymphoma  PRIOR THERAPY: R-CHOP x 6 cycles from 10/22/2019 to 02/04/2020  CURRENT THERAPY: Observation  INTERVAL HISTORY:  Nathan Waters, a 40 y.o. male, is seen for follow-up of his Hodgkin's lymphoma.  Denies any fevers, night sweats or weight loss in the last 6 months.  Denies any infections or hospitalizations or ER visits.  Appetite and energy levels are 100%.  Peripheral neuropathy has completely resolved.   REVIEW OF SYSTEMS:  Review of Systems  Constitutional: Negative for appetite change, chills, diaphoresis, fatigue and fever.  Skin: Negative for itching.  All other systems reviewed and are negative.   PAST MEDICAL/SURGICAL HISTORY:  Past Medical History:  Diagnosis Date  . Asthma   . Coronary atherosclerosis of native coronary artery    BMS to mid circumflex 11/2011, LVEF 55-60%  . Essential hypertension   . Migraine   . Mixed hyperlipidemia   . STEMI (ST elevation myocardial infarction) ([place])    Complicated by VF arrest 11/2011   Past Surgical History:  Procedure Laterality Date  . AXILLARY LYMPH NODE BIOPSY Left 07/26/2019   Procedure: AXILLARY LYMPH NODE BIOPSY;  Surgeon: Aviva Signs, MD;  Location: [facility];  Service: General;  Laterality: Left;  . BACK SURGERY    . CARDIAC CATHETERIZATION    . CHOLECYSTECTOMY N/A 08/26/2019   Procedure: LAPAROSCOPIC CHOLECYSTECTOMY;  Surgeon: Aviva Signs, MD;  Location: [facility];  Service: General;  Laterality: N/A;  . LEFT HEART CATHETERIZATION WITH CORONARY ANGIOGRAM N/A 11/21/2011   Procedure: LEFT HEART CATHETERIZATION WITH CORONARY ANGIOGRAM;  Surgeon: Peter M Martinique, MD;  Location: [facility] CATH LAB;  Service: Cardiovascular;  Laterality: N/A;  . LYMPH NODE BIOPSY Right 08/26/2019    Procedure: LYMPH NODE BIOPSY, INGUINAL;  Surgeon: Aviva Signs, MD;  Location: [facility];  Service: General;  Laterality: Right;  . PERCUTANEOUS CORONARY STENT INTERVENTION (PCI-S) N/A 11/21/2011   Procedure: PERCUTANEOUS CORONARY STENT INTERVENTION (PCI-S);  Surgeon: Peter M Martinique, MD;  Location[facility] CATH LAB;  Service: Cardiovascular;  Laterality: N/A;  . PORTACATH PLACEMENT Left 10/01/2019   Procedure: INSERTION PORT-A-CATH (catheter attached left subclavian);  Surgeon: Aviva Signs, MD;  Location: [facility];  Service: General;  Laterality: Left;  . SPLENECTOMY      SOCIAL HISTORY:  Social History   Socioeconomic History  . Marital status: Divorced    Spouse name: Not on file  . Number of children: 2  . Years of education: Not on file  . Highest education level: Not on file  Occupational History  . Occupation: not employed  Tobacco Use  . Smoking status: Current Every Day Smoker    Packs/day: 1.00    Types: Cigarettes  . Smokeless tobacco: Never Used  Vaping Use  . Vaping Use: Former  . Start date: 04/27/2012  Substance and Sexual Activity  . Alcohol use: Yes    Comment: Occasionally  . Drug use: No  . Sexual activity: Yes    Birth control/protection: Rhythm  Other Topics Concern  . Not on file  Social History Narrative  . Not on file   Social Determinants of Health   Financial Resource Strain: Low Risk   . Difficulty of Paying Living Expenses: Not hard at all  Food Insecurity: No Food Insecurity  .  Worried About Charity fundraiser in the Last Year: Never true  . Ran Out of Food in the Last Year: Never true  Transportation Needs: No Transportation Needs  . Lack of Transportation (Medical): No  . Lack of Transportation (Non-Medical): No  Physical Activity: Not on file  Stress: No Stress Concern Present  . Feeling of Stress : Not at all  Social Connections: Socially Isolated  . Frequency of Communication with Friends and Family: More than three times a week  .  Frequency of Social Gatherings with Friends and Family: Three times a week  . Attends Religious Services: Never  . Active Member of Clubs or Organizations: No  . Attends Archivist Meetings: Never  . Marital Status: Divorced  Human resources officer Violence: Not At Risk  . Fear of Current or Ex-Partner: No  . Emotionally Abused: No  . Physically Abused: No  . Sexually Abused: No    FAMILY HISTORY:  Family History  Problem Relation Age of Onset  . Malignant hyperthermia Mother   . Cancer Mother   . Malignant hyperthermia Brother   . Heart disease Brother   . Asthma Brother   . COPD Brother   . Heart disease Father   . Cancer Father   . ADD / ADHD Daughter   . ADD / ADHD Son     CURRENT MEDICATIONS:  Current Outpatient Medications  Medication Sig Dispense Refill  . albuterol (PROVENTIL HFA;VENTOLIN HFA) 108 (90 Base) MCG/ACT inhaler Inhale 2 puffs into the lungs every 6 (six) hours as needed for wheezing or shortness of breath.     . cetirizine (ZYRTEC) 10 MG tablet Take 10 mg by mouth daily as needed for allergies.      No current facility-administered medications for this visit.    ALLERGIES:  Allergies  Allergen Reactions  . Penicillins Anaphylaxis    Did it involve swelling of the face/tongue/throat, SOB, or low BP? Yes Did it involve sudden or severe rash/hives, skin peeling, or any reaction on the inside of your mouth or nose? No Did you need to seek medical attention at a hospital or doctor's office? Yes When did it last happen?Childhood allergy If all above answers are "NO", may proceed with cephalosporin use.     PHYSICAL EXAM:  Performance status (ECOG): 0 - Asymptomatic  Vitals:   12/29/20 1606  BP: 124/89  Pulse: 86  Resp: 17  Temp: 98.1 F (36.7 C)  SpO2: 96%   Wt Readings from Last 3 Encounters:  12/29/20 281 lb 3 oz (127.5 kg)  09/23/20 286 lb 3.2 oz (129.8 kg)  07/15/20 288 lb 9.6 oz (130.9 kg)   Physical Exam Vitals reviewed.   Constitutional:      Appearance: Normal appearance.  Cardiovascular:     Rate and Rhythm: Normal rate and regular rhythm.     Pulses: Normal pulses.     Heart sounds: Normal heart sounds.  Pulmonary:     Effort: Pulmonary effort is normal.     Breath sounds: Normal breath sounds.  Chest:  Breasts:     Right: No axillary adenopathy.     Left: No axillary adenopathy.    Abdominal:     Palpations: Abdomen is soft. There is no hepatomegaly, splenomegaly or mass.     Tenderness: There is no abdominal tenderness.     Hernia: No hernia is present.  Musculoskeletal:     Right lower leg: No edema.     Left lower leg: No edema.  Lymphadenopathy:     Cervical: No cervical adenopathy.     Upper Body:     Right upper body: No axillary or pectoral adenopathy.     Left upper body: No axillary or pectoral adenopathy.     Lower Body: No right inguinal adenopathy. No left inguinal adenopathy.  Neurological:     General: No focal deficit present.     Mental Status: He is alert and oriented to person, place, and time.  Psychiatric:        Mood and Affect: Mood normal.        Behavior: Behavior normal.     LABORATORY DATA:  I have reviewed the labs as listed.  CBC Latest Ref Rng & Units 12/22/2020 09/23/2020 04/29/2020  WBC 4.0 - 10.5 K/uL 11.0(H) 8.4 9.6  Hemoglobin 13.0 - 17.0 g/dL 15.0 16.0 15.1  Hematocrit 39.0 - 52.0 % 45.0 47.5 43.9  Platelets 150 - 400 K/uL 309 324 292   CMP Latest Ref Rng & Units 12/22/2020 09/23/2020 04/29/2020  Glucose 70 - 99 mg/dL 141(H) 125(H) 118(H)  BUN 6 - 20 mg/dL $Remove'8 8 11  'dFSfmrc$ Creatinine 0.61 - 1.24 mg/dL 0.77 0.91 0.77  Sodium 135 - 145 mmol/L 139 139 139  Potassium 3.5 - 5.1 mmol/L 3.6 4.7 3.5  Chloride 98 - 111 mmol/L 110 105 110  CO2 22 - 32 mmol/L 21(L) 27 24  Calcium 8.9 - 10.3 mg/dL 8.9 9.8 8.6(L)  Total Protein 6.5 - 8.1 g/dL 6.6 7.2 6.6  Total Bilirubin 0.3 - 1.2 mg/dL 0.5 0.6 0.5  Alkaline Phos 38 - 126 U/L 105 96 96  AST 15 - 41 U/L $Remo'27 26 29  'IrdlW$ ALT  0 - 44 U/L 39 39 44      Component Value Date/Time   RBC 4.48 12/22/2020 1315   MCV 100.4 (H) 12/22/2020 1315   MCH 33.5 12/22/2020 1315   MCHC 33.3 12/22/2020 1315   RDW 14.5 12/22/2020 1315   LYMPHSABS 2.8 12/22/2020 1315   MONOABS 1.1 (H) 12/22/2020 1315   EOSABS 0.4 12/22/2020 1315   BASOSABS 0.1 12/22/2020 1315    DIAGNOSTIC IMAGING:  I have independently reviewed the scans and discussed with the patient. No results found.   ASSESSMENT:  1. NLP Hodgkin's disease: -6 cycles of R-CHOP from 10/23/2019 through 02/04/2020. -PET scan on 03/25/2020 showed most of the lymph nodes with Deauville 2 activity with normal size limits.  2 of the right external iliac lymph nodes remain mildly enlarged.  Right thigh mass also normalized.   PLAN:  1. NLP Hodgkin's disease: -Physical examination today did not reveal any palpable adenopathy. -Reviewed his labs from 12/22/2020 which showed normal LDH.  Comprehensive metabolic panel is within normal limits.  CBC was also grossly within normal limits.  ESR was slightly elevated at 20. -PET scan on 09/21/2020 showed stable appearance of mildly prominent level 2 right external iliac lymph nodes.  New focal nodularity/airspace opacity inferiorly in the right upper lobe very low-grade activity, probably inflammatory. -Recommend a return visit in 3 months with repeat labs and physical exam. -We will also repeat PET scan based on new findings on the previous scan from December.  2. Peripheral neuropathy: -Does not have any neuropathy at this time.  This is completely resolved.  Orders placed this encounter:  Orders Placed This Encounter  Procedures  . NM PET Image Restag (PS) Skull Base To Thigh  . Lactate dehydrogenase  . Sedimentation rate  . CBC with Differential/Platelet  . Comprehensive metabolic  panel     Derek Jack, MD [place] [phone]   I, Milinda Antis, am acting as a scribe for Dr. Sanda Linger.  I, Derek Jack MD, have reviewed the above documentation for accuracy and completeness, and I agree with the above.

## 2021-01-04 ENCOUNTER — Encounter (HOSPITAL_COMMUNITY): Payer: Self-pay

## 2021-01-04 ENCOUNTER — Other Ambulatory Visit: Payer: Self-pay

## 2021-01-04 ENCOUNTER — Inpatient Hospital Stay (HOSPITAL_COMMUNITY): Payer: Medicaid Other

## 2021-01-04 VITALS — BP 118/77 | HR 63 | Temp 97.0°F | Resp 18

## 2021-01-04 DIAGNOSIS — C8190 Hodgkin lymphoma, unspecified, unspecified site: Secondary | ICD-10-CM

## 2021-01-04 DIAGNOSIS — C8108 Nodular lymphocyte predominant Hodgkin lymphoma, lymph nodes of multiple sites: Secondary | ICD-10-CM

## 2021-01-04 DIAGNOSIS — R591 Generalized enlarged lymph nodes: Secondary | ICD-10-CM

## 2021-01-04 MED ORDER — HEPARIN SOD (PORK) LOCK FLUSH 100 UNIT/ML IV SOLN
500.0000 [IU] | Freq: Once | INTRAVENOUS | Status: AC
  Administered 2021-01-04: 500 [IU] via INTRAVENOUS

## 2021-01-04 MED ORDER — SODIUM CHLORIDE 0.9% FLUSH
10.0000 mL | Freq: Once | INTRAVENOUS | Status: AC
  Administered 2021-01-04: 10 mL via INTRAVENOUS

## 2021-01-04 NOTE — Progress Notes (Signed)
Patients port flushed without difficulty.  No blood return noted, but no pain with flushing and pt could taste the saline.   No bruising or swelling noted at site.  Band aid applied.  VSS with discharge and left ambulatory with no s/s of distress noted

## 2021-03-09 IMAGING — PT NM PET TUM IMG RESTAG (PS) SKULL BASE T - THIGH
1 of 7 series · 1 of 25 positions shown · non-contrast
Comparison: PET exam of 08/13/2019 and CT chest, abdomen and pelvis
of 08/12/2019

CLINICAL DATA: Subsequent treatment strategy for Hodgkin lymphoma.

EXAM:
NUCLEAR MEDICINE PET SKULL BASE TO THIGH
TECHNIQUE: 13.8 mCi F-18 FDG was injected intravenously. Full-ring PET imaging
was performed from the skull base to thigh after the radiotracer. CT
data was obtained and used for attenuation correction and anatomic
localization.
Fasting blood glucose: 110 mg/dl

[Series 2: ac ct sk_thigh 5.0 hd_fov · axial · 5.0mm · 1.52mm/px · 1 of 239 slices shown]
[im 1/239  soft-tissue]
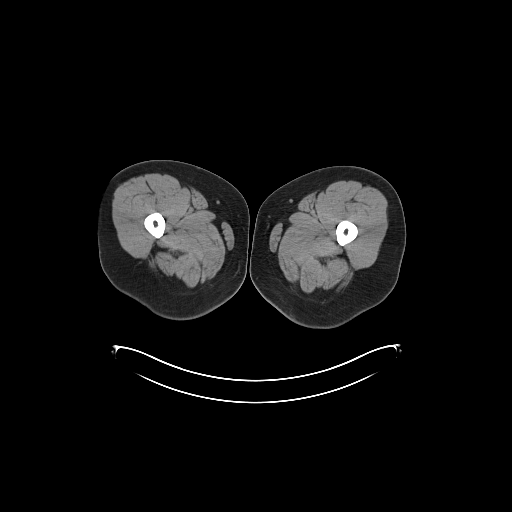

[1 of 25 positions shown; findings below may reference images not displayed]

FINDINGS: Mediastinal blood pool activity: SUV max

Liver activity: SUV max

NECK: Asymmetry of tonsillar uptake within the left palatine tonsil
(SUVmax = 8.9) there was bilateral activity on the previous
examination that showed a max SUV of 8.4 on the left. Slightly less
activity is seen on the right than on the prior study and is
relatively asymmetric as compared to the left at 6.7 maximum SUV.

No hypermetabolic lymph nodes in the neck.

Incidental CT findings: none

CHEST: Marked decrease in size of lymph nodes throughout the chest
with resolution of increased metabolic activity associated with
these nodes along the right paratracheal chain. The largest node on
today's study (image 68, series [DATE] cm in greatest dimension
(SUVmax = 2.5) as compared to 11.2 on the prior study.

Scattered lymph nodes in the left axilla adjacent to axillary
dissection changes. Some skin thickening noted along the left axilla
(SUVmax = 6.0 (image 68, series 2 showing some subcutaneous
stranding in this area but no focal fluid collection.

Largest axillary lymph node is less than a cm in the left axilla
(SUVmax = 2.2)

Incidental CT findings: Left subclavian Port-A-Cath terminates in
the distal superior vena cava. Mildly nodular thyroid similar to the
prior exam. Heart size is stable. Aortic caliber is normal. Basilar
atelectasis. Airways are patent.

ABDOMEN/PELVIS:

Diminished size of lymph nodes in the abdomen and pelvis.

Portacaval lymph node measuring 11 mm short axis previously 17 mm
(SUVmax = 3.4) previously

Right external iliac lymph node (image 174, series 2) 7 mm,
previously 13 mm (SUVmax = 2.5) previously

Bulky right external iliac lymph node seen on the prior study (image
196, series [DATE] x 17 mm, previously 47 by 33 mm. (SUVmax = 2.1)
previously 6.6. This is the largest of the remaining lymph nodes in
the pelvis. There are no lymph nodes in the pelvis that display
uptake greater than background liver activity.

The very large lymph node in the lower portion of the inguinal
region on the right tracking into the upper thigh, seen on the prior
study has essentially resolved with only a thin rim of cortex
remaining, if this is measured in short axis and measures
approximately 18 mm (image 213, series 2) previously 45 mm. (SUVmax
= 1.9,) 11.4 on the prior study

Incidental CT findings: Noncontrast appearance of liver is normal.
Post cholecystectomy without biliary ductal dilation. Pancreas is
normal. Spleen is surgically absent. Stomach, small and large bowel
are normal.

SKELETON: Diffuse uptake throughout the visualized skeleton without
suspicious focal area of increased metabolism.

Incidental CT findings: Signs of L4-5 spinal fusion, similar to the
prior exam.
IMPRESSION: 1. Bulky lymph nodes in the chest, abdomen and pelvis with
diminished metabolic activity and size as described, none exceeding
background liver activity. [HOSPITAL] 3
2. Asymmetric tonsillar activity greater on the left than the right,
of uncertain significance. Attention on follow-up is suggested.
3. Soft tissue stranding adjacent to left axillary lymph node
dissection changes. Correlate with any clinical evidence of
inflammation or skin thickening in this area.
4. Mild symmetric and diffuse skeletal uptake likely related to
treatment and or mild marrow stimulation. Attention on follow-up.

## 2021-04-06 ENCOUNTER — Encounter (HOSPITAL_COMMUNITY): Payer: Self-pay

## 2021-04-06 ENCOUNTER — Other Ambulatory Visit: Payer: Self-pay

## 2021-04-06 ENCOUNTER — Inpatient Hospital Stay (HOSPITAL_COMMUNITY): Payer: Medicaid Other | Attending: Hematology

## 2021-04-06 DIAGNOSIS — C8190 Hodgkin lymphoma, unspecified, unspecified site: Secondary | ICD-10-CM

## 2021-04-06 DIAGNOSIS — C8108 Nodular lymphocyte predominant Hodgkin lymphoma, lymph nodes of multiple sites: Secondary | ICD-10-CM | POA: Diagnosis not present

## 2021-04-06 LAB — COMPREHENSIVE METABOLIC PANEL
ALT: 40 U/L (ref 0–44)
AST: 30 U/L (ref 15–41)
Albumin: 4 g/dL (ref 3.5–5.0)
Alkaline Phosphatase: 98 U/L (ref 38–126)
Anion gap: 5 (ref 5–15)
BUN: 8 mg/dL (ref 6–20)
CO2: 26 mmol/L (ref 22–32)
Calcium: 9 mg/dL (ref 8.9–10.3)
Chloride: 107 mmol/L (ref 98–111)
Creatinine, Ser: 0.74 mg/dL (ref 0.61–1.24)
GFR, Estimated: >60 mL/min (ref >60)
Glucose, Bld: 116 mg/dL — ABNORMAL HIGH (ref 70–99)
Potassium: 3.8 mmol/L (ref 3.5–5.1)
Sodium: 138 mmol/L (ref 135–145)
Total Bilirubin: 0.8 mg/dL (ref 0.3–1.2)
Total Protein: 6.8 g/dL (ref 6.5–8.1)

## 2021-04-06 LAB — CBC WITH DIFFERENTIAL/PLATELET
Abs Immature Granulocytes: 0.02 10*3/uL (ref 0.00–0.07)
Basophils Absolute: 0.1 10*3/uL (ref 0.0–0.1)
Basophils Relative: 1 %
Eosinophils Absolute: 0.3 10*3/uL (ref 0.0–0.5)
Eosinophils Relative: 2 %
HCT: 45.7 % (ref 39.0–52.0)
Hemoglobin: 15.5 g/dL (ref 13.0–17.0)
Immature Granulocytes: 0 %
Lymphocytes Relative: 29 %
Lymphs Abs: 3.3 10*3/uL (ref 0.7–4.0)
MCH: 33.5 pg (ref 26.0–34.0)
MCHC: 33.9 g/dL (ref 30.0–36.0)
MCV: 98.9 fL (ref 80.0–100.0)
Monocytes Absolute: 0.7 10*3/uL (ref 0.1–1.0)
Monocytes Relative: 7 %
Neutro Abs: 7 10*3/uL (ref 1.7–7.7)
Neutrophils Relative %: 61 %
Platelets: 311 10*3/uL (ref 150–400)
RBC: 4.62 MIL/uL (ref 4.22–5.81)
RDW: 14.4 % (ref 11.5–15.5)
WBC: 11.4 10*3/uL — ABNORMAL HIGH (ref 4.0–10.5)
nRBC: 0 % (ref 0.0–0.2)

## 2021-04-06 LAB — SEDIMENTATION RATE: Sed Rate: 5 mm/hr (ref 0–16)

## 2021-04-06 LAB — LACTATE DEHYDROGENASE: LDH: 98 U/L (ref 98–192)

## 2021-04-06 NOTE — Progress Notes (Signed)
Nathan Waters presented for Portacath access and flush.   Good blood return present. Portacath flushed with 10 ml NS and 500U/77ml Heparin and needle removed intact.  Discharged from clinic ambulatory in stable condition. Alert and oriented x 3. F/U with [facility] as scheduled.

## 2021-04-06 NOTE — Patient Instructions (Signed)
[facility]  Discharge Instructions: Thank you for choosing [facility] to provide your oncology and hematology care.  If you have a lab appointment with the [facility], please come in thru the Main Entrance and check in at the main information desk.  Wear comfortable clothing and clothing appropriate for easy access to any Portacath or PICC line.   We strive to give you quality time with your provider. You may need to reschedule your appointment if you arrive late (15 or more minutes).  Arriving late affects you and other patients whose appointments are after yours.  Also, if you miss three or more appointments without notifying the office, you may be dismissed from the clinic at the provider's discretion.         BELOW ARE SYMPTOMS THAT SHOULD BE REPORTED IMMEDIATELY: *FEVER GREATER THAN 100.4 F (38 C) OR HIGHER *CHILLS OR SWEATING *NAUSEA AND VOMITING THAT IS NOT CONTROLLED WITH YOUR NAUSEA MEDICATION *UNUSUAL SHORTNESS OF BREATH *UNUSUAL BRUISING OR BLEEDING *URINARY PROBLEMS (pain or burning when urinating, or frequent urination) *BOWEL PROBLEMS (unusual diarrhea, constipation, pain near the anus) TENDERNESS IN MOUTH AND THROAT WITH OR WITHOUT PRESENCE OF ULCERS (sore throat, sores in mouth, or a toothache) UNUSUAL RASH, SWELLING OR PAIN  UNUSUAL VAGINAL DISCHARGE OR ITCHING   Items with * indicate a potential emergency and should be followed up as soon as possible or go to the Emergency Department if any problems should occur.  Please show the CHEMOTHERAPY ALERT CARD or IMMUNOTHERAPY ALERT CARD at check-in to the Emergency Department and triage nurse.  Should you have questions after your visit or need to cancel or reschedule your appointment, please contact [facility] [phone]  and follow the prompts.  Office hours are 8:00 a.m. to 4:30 p.m. Monday - Friday. Please note that voicemails left after 4:00 p.m. may not be returned until the  following business day.  We are closed weekends and major holidays. You have access to a nurse at all times for urgent questions. Please call the main number to the clinic [phone] and follow the prompts.  For any non-urgent questions, you may also contact your provider using MyChart. We now offer e-Visits for anyone 27 and older to request care online for non-urgent symptoms. For details visit [email or web address].   Also download the MyChart app! Go to the app store, search "MyChart", open the app, select [facility], and log in with your MyChart username and password.  Due to Covid, a mask is required upon entering the hospital/clinic. If you do not have a mask, one will be given to you upon arrival. For doctor visits, patients may have 1 support person aged 23 or older with them. For treatment visits, patients cannot have anyone with them due to current Covid guidelines and our immunocompromised population.

## 2021-04-13 ENCOUNTER — Ambulatory Visit (HOSPITAL_COMMUNITY): Payer: Medicaid Other

## 2021-04-14 ENCOUNTER — Ambulatory Visit (HOSPITAL_COMMUNITY): Payer: Medicaid Other | Admitting: Hematology

## 2021-04-23 ENCOUNTER — Other Ambulatory Visit: Payer: Self-pay

## 2021-04-23 ENCOUNTER — Ambulatory Visit (HOSPITAL_COMMUNITY)
Admission: RE | Admit: 2021-04-23 | Discharge: 2021-04-23 | Disposition: A | Discharge status: Home / Self Care | Payer: Medicaid Other | Source: Ambulatory Visit | Attending: Hematology | Admitting: Hematology

## 2021-04-23 DIAGNOSIS — C8190 Hodgkin lymphoma, unspecified, unspecified site: Secondary | ICD-10-CM | POA: Diagnosis not present

## 2021-04-23 LAB — GLUCOSE, CAPILLARY: Glucose-Capillary: 134 mg/dL — ABNORMAL HIGH (ref 70–99)

## 2021-04-23 MED ORDER — FLUDEOXYGLUCOSE F - 18 (FDG) INJECTION
14.5000 | Freq: Once | INTRAVENOUS | Status: AC | PRN
  Administered 2021-04-23: 14.5 via INTRAVENOUS

## 2021-04-29 ENCOUNTER — Inpatient Hospital Stay (HOSPITAL_COMMUNITY): Payer: Medicaid Other | Attending: Hematology | Admitting: Physician Assistant

## 2021-04-29 ENCOUNTER — Other Ambulatory Visit: Payer: Self-pay

## 2021-04-29 VITALS — BP 117/86 | HR 86 | Resp 16 | Wt 284.2 lb

## 2021-04-29 DIAGNOSIS — C8108 Nodular lymphocyte predominant Hodgkin lymphoma, lymph nodes of multiple sites: Secondary | ICD-10-CM | POA: Diagnosis not present

## 2021-04-29 NOTE — Progress Notes (Signed)
Nathan Waters, [place] [id number]   VIRTUAL VISIT:   CLINIC:  Medical Oncology/Hematology  PCP:  Nathan Rower, PA-C [address] / [place] [address]  [phone]  REASON FOR VISIT:  Follow-up for Hodgkin's lymphoma  PRIOR THERAPY: R-CHOP x 6 cycles from 10/22/2019 to 02/04/2020  CURRENT THERAPY: Observation  INTERVAL HISTORY:  [name], a 40 y.o. male, is seen for follow-up of his Hodgkin's lymphoma.  He was last seen on 12/29/2020. [name] reports that overall his energy levels are stable. He is able to complete his ADLs on his own. He has a good appetite without any noticeable weight changes. He denies any nausea, vomiting or abdominal pain. He continues to have low back pain that radiates to the left hip area which has been present chronically since his back surgery. He notes having the sensation of "pinched nerve" down his legs intermittently. He denies any numbness in the lower extremities.  Denies any new lumps/bumps. Patient denies any fevers, chills, night sweats, shortness of breath, chest pain or cough. He has no other complaints.     REVIEW OF SYSTEMS:  Review of Systems  Constitutional:  Negative for appetite change, chills, diaphoresis, fatigue and fever.  Respiratory:  Negative for cough, hemoptysis, shortness of breath and wheezing.   Cardiovascular:  Negative for chest pain, leg swelling and palpitations.  Gastrointestinal:  Negative for abdominal pain, blood in stool, constipation, diarrhea, nausea and vomiting.  Musculoskeletal:  Positive for back pain. Negative for neck pain.  Skin:  Negative for itching and rash.  Neurological:  Negative for dizziness and numbness.  All other systems reviewed and are negative.  PAST MEDICAL/SURGICAL HISTORY:  Past Medical History:  Diagnosis Date   Asthma    Coronary atherosclerosis of native coronary artery    BMS to mid circumflex 11/2011, LVEF 55-60%    Essential hypertension    Migraine    Mixed hyperlipidemia    STEMI (ST elevation myocardial infarction) ([place])    Complicated by VF arrest 11/2011   Past Surgical History:  Procedure Laterality Date   AXILLARY LYMPH NODE BIOPSY Left 07/26/2019   Procedure: AXILLARY LYMPH NODE BIOPSY;  Surgeon: Aviva Signs, MD;  Location: [facility];  Service: General;  Laterality: Left;   BACK SURGERY     CARDIAC CATHETERIZATION     CHOLECYSTECTOMY N/A 08/26/2019   Procedure: LAPAROSCOPIC CHOLECYSTECTOMY;  Surgeon: Aviva Signs, MD;  Location: [facility];  Service: General;  Laterality: N/A;   LEFT HEART CATHETERIZATION WITH CORONARY ANGIOGRAM N/A 11/21/2011   Procedure: LEFT HEART CATHETERIZATION WITH CORONARY ANGIOGRAM;  Surgeon: Peter M Martinique, MD;  Location: [facility] CATH LAB;  Service: Cardiovascular;  Laterality: N/A;   LYMPH NODE BIOPSY Right 08/26/2019   Procedure: LYMPH NODE BIOPSY, INGUINAL;  Surgeon: Aviva Signs, MD;  Location: [facility];  Service: General;  Laterality: Right;   PERCUTANEOUS CORONARY STENT INTERVENTION (PCI-S) N/A 11/21/2011   Procedure: PERCUTANEOUS CORONARY STENT INTERVENTION (PCI-S);  Surgeon: Peter M Martinique, MD;  Location: [facility] CATH LAB;  Service: Cardiovascular;  Laterality: N/A;   PORTACATH PLACEMENT Left 10/01/2019   Procedure: INSERTION PORT-A-CATH (catheter attached left subclavian);  Surgeon: Aviva Signs, MD;  Location: [facility];  Service: General;  Laterality: Left;   SPLENECTOMY      SOCIAL HISTORY:  Social History   Socioeconomic History   Marital status: Divorced    Spouse name: Not on file   Number of children: 2  Years of education: Not on file   Highest education level: Not on file  Occupational History   Occupation: not employed  Tobacco Use   Smoking status: Every Day    Packs/day: 1.00    Types: Cigarettes   Smokeless tobacco: Never  Vaping Use   Vaping Use: Former   Start date: 04/27/2012  Substance and Sexual Activity   Alcohol use: Yes    Comment:  Occasionally   Drug use: No   Sexual activity: Yes    Birth control/protection: Rhythm  Other Topics Concern   Not on file  Social History Narrative   Not on file   Social Determinants of Health   Financial Resource Strain: Low Risk    Difficulty of Paying Living Expenses: Not hard at all  Food Insecurity: No Food Insecurity   Worried About Charity fundraiser in the Last Year: Never true   [place] in the Last Year: Never true  Transportation Needs: No Transportation Needs   Lack of Transportation (Medical): No   Lack of Transportation (Non-Medical): No  Physical Activity: Not on file  Stress: No Stress Concern Present   Feeling of Stress : Not at all  Social Connections: Socially Isolated   Frequency of Communication with Friends and Family: More than three times a week   Frequency of Social Gatherings with Friends and Family: Three times a week   Attends Religious Services: Never   Active Member of Clubs or Organizations: No   Attends Music therapist: Never   Marital Status: Divorced  Human resources officer Violence: Not At Risk   Fear of Current or Ex-Partner: No   Emotionally Abused: No   Physically Abused: No   Sexually Abused: No    FAMILY HISTORY:  Family History  Problem Relation Age of Onset   Malignant hyperthermia Mother    Cancer Mother    Malignant hyperthermia Brother    Heart disease Brother    Asthma Brother    COPD Brother    Heart disease Father    Cancer Father    ADD / ADHD Daughter    ADD / ADHD Son     CURRENT MEDICATIONS:  Current Outpatient Medications  Medication Sig Dispense Refill   albuterol (PROVENTIL HFA;VENTOLIN HFA) 108 (90 Base) MCG/ACT inhaler Inhale 2 puffs into the lungs every 6 (six) hours as needed for wheezing or shortness of breath.      cetirizine (ZYRTEC) 10 MG tablet Take 10 mg by mouth daily as needed for allergies.      lidocaine-prilocaine (EMLA) cream Apply 1 application topically as needed.  (Patient not taking: Reported on 04/29/2021)     No current facility-administered medications for this visit.    ALLERGIES:  Allergies  Allergen Reactions   Penicillins Anaphylaxis    Did it involve swelling of the face/tongue/throat, SOB, or low BP? Yes Did it involve sudden or severe rash/hives, skin peeling, or any reaction on the inside of your mouth or nose? No Did you need to seek medical attention at a hospital or doctor's office? Yes When did it last happen?  Childhood allergy     If all above answers are "NO", may proceed with cephalosporin use.     Physical exam not performed due to virtual visit.   Vitals:   04/29/21 1451  BP: 117/86  Pulse: 86  Resp: 16  SpO2: 95%   Wt Readings from Last 3 Encounters:  04/29/21 284 lb 2.8 oz (128.9 kg)  12/29/20 281 lb 3 oz (127.5 kg)  09/23/20 286 lb 3.2 oz (129.8 kg)    LABORATORY DATA:  I have reviewed the labs as listed.  CBC Latest Ref Rng & Units 04/06/2021 12/22/2020 09/23/2020  WBC 4.0 - 10.5 K/uL 11.4(H) 11.0(H) 8.4  Hemoglobin 13.0 - 17.0 g/dL 15.5 15.0 16.0  Hematocrit 39.0 - 52.0 % 45.7 45.0 47.5  Platelets 150 - 400 K/uL 311 309 324   CMP Latest Ref Rng & Units 04/06/2021 12/22/2020 09/23/2020  Glucose 70 - 99 mg/dL 116(H) 141(H) 125(H)  BUN 6 - 20 mg/dL 8 8 8   Creatinine 0.61 - 1.24 mg/dL 0.74 0.77 0.91  Sodium 135 - 145 mmol/L 138 139 139  Potassium 3.5 - 5.1 mmol/L 3.8 3.6 4.7  Chloride 98 - 111 mmol/L 107 110 105  CO2 22 - 32 mmol/L 26 21(L) 27  Calcium 8.9 - 10.3 mg/dL 9.0 8.9 9.8  Total Protein 6.5 - 8.1 g/dL 6.8 6.6 7.2  Total Bilirubin 0.3 - 1.2 mg/dL 0.8 0.5 0.6  Alkaline Phos 38 - 126 U/L 98 105 96  AST 15 - 41 U/L 30 27 26   ALT 0 - 44 U/L 40 39 39      Component Value Date/Time   RBC 4.62 04/06/2021 1543   MCV 98.9 04/06/2021 1543   MCH 33.5 04/06/2021 1543   MCHC 33.9 04/06/2021 1543   RDW 14.4 04/06/2021 1543   LYMPHSABS 3.3 04/06/2021 1543   MONOABS 0.7 04/06/2021 1543   EOSABS 0.3  04/06/2021 1543   BASOSABS 0.1 04/06/2021 1543    DIAGNOSTIC IMAGING:  I have independently reviewed the scans and discussed with the patient. NM PET Image Restag (PS) Skull Base To Thigh  Result Date: 04/24/2021 CLINICAL DATA:  Subsequent treatment strategy for Hodgkin's lymphoma follow-up. Chemotherapy last July. EXAM: NUCLEAR MEDICINE PET SKULL BASE TO THIGH TECHNIQUE: 14.0 mCi F-18 FDG was injected intravenously. Full-ring PET imaging was performed from the skull base to thigh after the radiotracer. CT data was obtained and used for attenuation correction and anatomic localization. Fasting blood glucose: 134  mg/dl COMPARISON:  09/22/2020 FINDINGS: Mediastinal blood pool activity: SUV max 2.7 Liver activity: SUV max 4.1 NECK: New right-sided level 2 nodal hypermetabolism including an 8 mm node which measures a S.U.V. max of 4.3 on 27/4 and a 7 mm node which measures a S.U.V. max of 5.0 on 32/4. Incidental CT findings: Multiple small bilateral nodes within the jugular chains and posterior triangles. These may be slightly more distinct than on the prior exam. CHEST: A left axillary node measures 8 mm and a S.U.V. max of 2.3 today versus similar in size and a S.U.V. max of 2.2 on the prior exam, favored to be reactive. Inferior right upper lobe pulmonary hypermetabolism and opacity have resolved. Incidental CT findings: Left Port-A-Cath tip at superior caval/atrial junction. Left axillary node dissection. ABDOMEN/PELVIS: Right external iliac node measures 1.2 cm and a S.U.V. max of 3.1 on 190/4. 1.1 cm and a S.U.V. max of 1.8 on the prior exam. More caudal right external iliac node measures 1.8 cm and a S.U.V. max of 4.7 on 198/4. Compare 1.6 cm and a S.U.V. max of 2.3. New hypermetabolic enlarged right inguinal nodes, including at 1.4 cm and a S.U.V. max of 4.2 on 202/4. 1.2 cm and a S.U.V. max of 5.6 on 209/4. Incidental CT findings: Splenectomy. SKELETON: No abnormal marrow activity. Incidental CT  findings: Lumbar spine fixation. IMPRESSION: 1. Recurrent/progressive disease within the right pelvis as detailed  above. ([place]) 5 2. Mild hypermetabolism within right level 2 nodes, more equivocal but also suspicious for new disease. Electronically Signed   By: Abigail Miyamoto M.D.   On: 04/24/2021 16:19     ASSESSMENT/PLAN:  1.  NLP Hodgkin's disease: -6 cycles of R-CHOP from 10/23/2019 through 02/04/2020. --Currently on surveillance.  --Labs from 04/06/2021 were unremarkable except for mild leukocytosis with WBC of 11.4.  --PET scan from 04/23/2021 shows recurrent/progressive disease within the right pelvis ([place] 5). Additional there is new hypermetabolic enlarged right inguinal nodes.  --Recommend excisional biopsy so we will request a follow up with Dr. Arnoldo Morale.  --Return to the clinic in 3 weeks for a follow up after biopsy.   2.  Peripheral neuropathy: -Does not have any neuropathy at this time.  This is completely resolved.  Orders placed this encounter:  No orders of the defined types were placed in this encounter.  Patient expressed understanding of the plan provided.   I have spent a total of 40 minutes minutes of non-face-to-face time, preparing to see the patient, obtaining and/or reviewing separately obtained history, counseling and educating the patient, referring and communicating with other health care professionals, documenting clinical information in the electronic health record, and care coordination.   Lincoln Brigham PA-C Hematology and [place]

## 2021-04-29 NOTE — Patient Instructions (Addendum)
[facility] Discharge Instructions  You were seen today by Irene Thayil, PA.   Please follow up as scheduled.   Thank you for choosing [facility] at [facility] to provide your oncology and hematology care.  To afford each patient quality time with our provider, please arrive at least 15 minutes before your scheduled appointment time.   If you have a lab appointment with the Cancer Center please come in thru the Main Entrance and check in at the main information desk.  You need to re-schedule your appointment should you arrive 10 or more minutes late.  We strive to give you quality time with our providers, and arriving late affects you and other patients whose appointments are after yours.  Also, if you no show three or more times for appointments you may be dismissed from the clinic at the providers discretion.     Again, thank you for choosing [facility].  Our hope is that these requests will decrease the amount of time that you wait before being seen by our physicians.       _____________________________________________________________  Should you have questions after your visit to [facility], please contact our office at [phone] and follow the prompts.  Our office hours are 8:00 a.m. and 4:30 p.m. Monday - Friday.  Please note that voicemails left after 4:00 p.m. may not be returned until the following business day.  We are closed weekends and major holidays.  You do have access to a nurse 24-7, just call the main number to the clinic [phone] and do not press any options, hold on the line and a nurse will answer the phone.    For prescription refill requests, have your pharmacy contact our office and allow 72 hours.    Due to Covid, you will need to wear a mask upon entering the hospital. If you do not have a mask, a mask will be given to you at the Main Entrance upon arrival. For doctor  visits, patients may have 1 support person age 18 or older with them. For treatment visits, patients can not have anyone with them due to social distancing guidelines and our immunocompromised population.     

## 2021-05-20 ENCOUNTER — Encounter: Payer: Self-pay | Admitting: General Surgery

## 2021-05-20 ENCOUNTER — Ambulatory Visit: Payer: Medicaid Other | Admitting: General Surgery

## 2021-05-20 ENCOUNTER — Other Ambulatory Visit: Payer: Self-pay

## 2021-05-20 VITALS — BP 125/82 | HR 84 | Temp 98.4°F | Resp 16 | Ht 71.0 in | Wt 279.0 lb

## 2021-05-20 DIAGNOSIS — C8108 Nodular lymphocyte predominant Hodgkin lymphoma, lymph nodes of multiple sites: Secondary | ICD-10-CM

## 2021-05-21 NOTE — Patient Instructions (Signed)
Nathan Waters  05/21/2021     '@PREFPERIOPPHARMACY'$ @   Your procedure is scheduled on  05/26/2021.   Report to [facility] at   0700  A.M.   Call this number if you have problems the morning of surgery:  [phone]   Remember:  Do not eat or drink after midnight.      Take these medicines the morning of surgery with A SIP OF WATER                                    zyrtec     Do not wear jewelry, make-up or nail polish.  Do not wear lotions, powders, or perfumes, or deodorant.  Do not shave 48 hours prior to surgery.  Men may shave face and neck.  Do not bring valuables to the hospital.  [facility] is not responsible for any belongings or valuables.  Contacts, dentures or bridgework may not be worn into surgery.  Leave your suitcase in the car.  After surgery it may be brought to your room.  For patients admitted to the hospital, discharge time will be determined by your treatment team.  Patients discharged the day of surgery will not be allowed to drive home and must have someone with them for 24 hours.    Special instructions:   DO NOT smoke tobacco or vape for 24 hours before your procedure.  Please read over the following fact sheets that you were given. Coughing and Deep Breathing, Surgical Site Infection Prevention, Anesthesia Post-op Instructions, and Care and Recovery After Surgery      Open Lymph Node Biopsy, Care After The following information offers guidance on how to care for yourself after your procedure. Your health care provider may also give you more specific instructions. If you have problems or questions, contact your health careprovider. What can I expect after the procedure? After the procedure, it is common to have: Bruising. Soreness. Mild swelling. Follow these instructions at home: Medicines Take over-the-counter and prescription medicines only as told by your health care provider. If you were prescribed an antibiotic  medicine, take or use it as told by your health care provider. Do not stop taking the antibiotic even if you start to feel better. Do not drive or use heavy machinery while taking prescription pain medicine. Incision care  Follow instructions from your health care provider about how to take care of your incision. Make sure you: Wash your hands with soap and water for at least 20 seconds before and after you change your bandage (dressing). If soap and water are not available, use hand sanitizer. Change your dressing as told by your health care provider. Leave stitches (sutures), skin glue, or adhesive strips in place. These skin closures may need to stay in place for 2 weeks or longer. If adhesive strip edges start to loosen and curl up, you may trim the loose edges. Do not remove adhesive strips completely unless your health care provider tells you to do that. Check your incision area every day for signs of infection. Check for: More redness, swelling, or pain. Fluid or blood. Warmth. Pus or a bad smell.  General instructions Do not take baths, swim, or use a hot tub until your health care provider approves. Ask your health care provider if you may take showers. You may only be allowed to take sponge baths. If  you were given a sedative during the procedure, it can affect you for several hours. Do not drive or operate machinery until your health care provider says that it is safe. Return to your normal activities as told by your health care provider. Ask your health care provider what activities are safe for you. Keep all follow-up visits. This is important. Contact a health care provider if: You have a fever. You have increased redness, swelling, or pain around your incision. You have fluid or blood coming from your incision. Your incision feels warm to the touch. You have pus or a bad smell coming from your incision. You have pain or numbness that gets worse or lasts longer than a few  days. Summary After a lymph node biopsy, it is common to have bruising, soreness, and mild swelling. Follow your health care provider's instructions about taking care of yourself at home. You will be told how to take medicines, take care of your incision, and check for infection. Return to your normal activities as told by your health care provider. Ask your health care provider what activities are safe for you. Contact a health care provider if you have increased redness, swelling, or pain around your incision, you have a fever, or you have worsening pain or numbness. This information is not intended to replace advice given to you by your health care provider. Make sure you discuss any questions you have with your healthcare provider. Document Revised: 07/16/2020 Document Reviewed: 07/16/2020 Elsevier Patient Education  [place] Anesthesia, Adult, Care After This sheet gives you information about how to care for yourself after your procedure. Your health care provider may also give you more specific instructions. If you have problems or questions, contact your health careprovider. What can I expect after the procedure? After the procedure, the following side effects are common: Pain or discomfort at the IV site. Nausea. Vomiting. Sore throat. Trouble concentrating. Feeling cold or chills. Feeling weak or tired. Sleepiness and fatigue. Soreness and body aches. These side effects can affect parts of the body that were not involved in surgery. Follow these instructions at home: For the time period you were told by your health care provider:  Rest. Do not participate in activities where you could fall or become injured. Do not drive or use machinery. Do not drink alcohol. Do not take sleeping pills or medicines that cause drowsiness. Do not make important decisions or sign legal documents. Do not take care of children on your own.  Eating and drinking Follow any  instructions from your health care provider about eating or drinking restrictions. When you feel hungry, start by eating small amounts of foods that are soft and easy to digest (bland), such as toast. Gradually return to your regular diet. Drink enough fluid to keep your urine pale yellow. If you vomit, rehydrate by drinking water, juice, or clear broth. General instructions If you have sleep apnea, surgery and certain medicines can increase your risk for breathing problems. Follow instructions from your health care provider about wearing your sleep device: Anytime you are sleeping, including during daytime naps. While taking prescription pain medicines, sleeping medicines, or medicines that make you drowsy. Have a responsible adult stay with you for the time you are told. It is important to have someone help care for you until you are awake and alert. Return to your normal activities as told by your health care provider. Ask your health care provider what activities are safe for you. Take over-the-counter  and prescription medicines only as told by your health care provider. If you smoke, do not smoke without supervision. Keep all follow-up visits as told by your health care provider. This is important. Contact a health care provider if: You have nausea or vomiting that does not get better with medicine. You cannot eat or drink without vomiting. You have pain that does not get better with medicine. You are unable to pass urine. You develop a skin rash. You have a fever. You have redness around your IV site that gets worse. Get help right away if: You have difficulty breathing. You have chest pain. You have blood in your urine or stool, or you vomit blood. Summary After the procedure, it is common to have a sore throat or nausea. It is also common to feel tired. Have a responsible adult stay with you for the time you are told. It is important to have someone help care for you until you are  awake and alert. When you feel hungry, start by eating small amounts of foods that are soft and easy to digest (bland), such as toast. Gradually return to your regular diet. Drink enough fluid to keep your urine pale yellow. Return to your normal activities as told by your health care provider. Ask your health care provider what activities are safe for you. This information is not intended to replace advice given to you by your health care provider. Make sure you discuss any questions you have with your healthcare provider. Document Revised: 06/18/2020 Document Reviewed: 01/16/2020 Elsevier Patient Education  2022 [place]. How to Use Chlorhexidine for Bathing Chlorhexidine gluconate (CHG) is a germ-killing (antiseptic) solution that is used to clean the skin. It can get rid of the bacteria that normally live on the skin and can keep them away for about 24 hours. To clean your skin with CHG, you may be given: A CHG solution to use in the shower or as part of a sponge bath. A prepackaged cloth that contains CHG. Cleaning your skin with CHG may help lower the risk for infection: While you are staying in the intensive care unit of the hospital. If you have a vascular access, such as a central line, to provide short-term or long-term access to your veins. If you have a catheter to drain urine from your bladder. If you are on a ventilator. A ventilator is a machine that helps you breathe by moving air in and out of your lungs. After surgery. What are the risks? Risks of using CHG include: A skin reaction. Hearing loss, if CHG gets in your ears. Eye injury, if CHG gets in your eyes and is not rinsed out. The CHG product catching fire. Make sure that you avoid smoking and flames after applying CHG to your skin. Do not use CHG: If you have a chlorhexidine allergy or have previously reacted to chlorhexidine. On babies younger than 45 months of age. How to use CHG solution Use CHG only as told  by your health care provider, and follow the instructions on the label. Use the full amount of CHG as directed. Usually, this is one bottle. During a shower Follow these steps when using CHG solution during a shower (unless your health care provider gives you different instructions): Start the shower. Use your normal soap and shampoo to wash your face and hair. Turn off the shower or move out of the shower stream. Pour the CHG onto a clean washcloth. Do not use any type of brush or  rough-edged sponge. Starting at your neck, lather your body down to your toes. Make sure you follow these instructions: If you will be having surgery, pay special attention to the part of your body where you will be having surgery. Scrub this area for at least 1 minute. Do not use CHG on your head or face. If the solution gets into your ears or eyes, rinse them well with water. Avoid your genital area. Avoid any areas of skin that have broken skin, cuts, or scrapes. Scrub your back and under your arms. Make sure to wash skin folds. Let the lather sit on your skin for 1-2 minutes or as long as told by your health care provider. Thoroughly rinse your entire body in the shower. Make sure that all body creases and crevices are rinsed well. Dry off with a clean towel. Do not put any substances on your body afterward--such as powder, lotion, or perfume--unless you are told to do so by your health care provider. Only use lotions that are recommended by the manufacturer. Put on clean clothes or pajamas. If it is the night before your surgery, sleep in clean sheets.  During a sponge bath Follow these steps when using CHG solution during a sponge bath (unless your health care provider gives you different instructions): Use your normal soap and shampoo to wash your face and hair. Pour the CHG onto a clean washcloth. Starting at your neck, lather your body down to your toes. Make sure you follow these instructions: If you  will be having surgery, pay special attention to the part of your body where you will be having surgery. Scrub this area for at least 1 minute. Do not use CHG on your head or face. If the solution gets into your ears or eyes, rinse them well with water. Avoid your genital area. Avoid any areas of skin that have broken skin, cuts, or scrapes. Scrub your back and under your arms. Make sure to wash skin folds. Let the lather sit on your skin for 1-2 minutes or as long as told by your health care provider. Using a different clean, wet washcloth, thoroughly rinse your entire body. Make sure that all body creases and crevices are rinsed well. Dry off with a clean towel. Do not put any substances on your body afterward--such as powder, lotion, or perfume--unless you are told to do so by your health care provider. Only use lotions that are recommended by the manufacturer. Put on clean clothes or pajamas. If it is the night before your surgery, sleep in clean sheets. How to use CHG prepackaged cloths Only use CHG cloths as told by your health care provider, and follow the instructions on the label. Use the CHG cloth on clean, dry skin. Do not use the CHG cloth on your head or face unless your health care provider tells you to. When washing with the CHG cloth: Avoid your genital area. Avoid any areas of skin that have broken skin, cuts, or scrapes. Before surgery Follow these steps when using a CHG cloth to clean before surgery (unless your health care provider gives you different instructions): Using the CHG cloth, vigorously scrub the part of your body where you will be having surgery. Scrub using a back-and-forth motion for 3 minutes. The area on your body should be completely wet with CHG when you are done scrubbing. Do not rinse. Discard the cloth and let the area air-dry. Do not put any substances on the area afterward, such as powder,  lotion, or perfume. Put on clean clothes or pajamas. If it is  the night before your surgery, sleep in clean sheets.  For general bathing Follow these steps when using CHG cloths for general bathing (unless your health care provider gives you different instructions). Use a separate CHG cloth for each area of your body. Make sure you wash between any folds of skin and between your fingers and toes. Wash your body in the following order, switching to a new cloth after each step: The front of your neck, shoulders, and chest. Both of your arms, under your arms, and your hands. Your stomach and groin area, avoiding the genitals. Your right leg and foot. Your left leg and foot. The back of your neck, your back, and your buttocks. Do not rinse. Discard the cloth and let the area air-dry. Do not put any substances on your body afterward--such as powder, lotion, or perfume--unless you are told to do so by your health care provider. Only use lotions that are recommended by the manufacturer. Put on clean clothes or pajamas. Contact a health care provider if: Your skin gets irritated after scrubbing. You have questions about using your solution or cloth. Get help right away if: Your eyes become very red or swollen. Your eyes itch badly. Your skin itches badly and is red or swollen. Your hearing changes. You have trouble seeing. You have swelling or tingling in your mouth or throat. You have trouble breathing. You swallow any chlorhexidine. Summary Chlorhexidine gluconate (CHG) is a germ-killing (antiseptic) solution that is used to clean the skin. Cleaning your skin with CHG may help to lower your risk for infection. You may be given CHG to use for bathing. It may be in a bottle or in a prepackaged cloth to use on your skin. Carefully follow your health care provider's instructions and the instructions on the product label. Do not use CHG if you have a chlorhexidine allergy. Contact your health care provider if your skin gets irritated after scrubbing. This  information is not intended to replace advice given to you by your health care provider. Make sure you discuss any questions you have with your healthcare provider. Document Revised: 02/14/2020 Document Reviewed: 03/20/2020 Elsevier Patient Education  [place].

## 2021-05-21 NOTE — H&P (Signed)
Nathan Waters; [id number]; November 06, 1980   HPI Patient is a 40 year old white male who was referred to my care by oncology for a right inguinal lymph node biopsy.  He has a history of Hodgkin's lymphoma and has been treated in the past.  A recent PET scan revealed hypermetabolic lymph nodes in the right inguinal region. Past Medical History:  Diagnosis Date   Asthma    Coronary atherosclerosis of native coronary artery    BMS to mid circumflex 11/2011, LVEF 55-60%   Essential hypertension    Migraine    Mixed hyperlipidemia    STEMI (ST elevation myocardial infarction) ([place])    Complicated by VF arrest 11/2011    Past Surgical History:  Procedure Laterality Date   AXILLARY LYMPH NODE BIOPSY Left 07/26/2019   Procedure: AXILLARY LYMPH NODE BIOPSY;  Surgeon: Aviva Signs, MD;  Location: [facility];  Service: General;  Laterality: Left;   BACK SURGERY     CARDIAC CATHETERIZATION     CHOLECYSTECTOMY N/A 08/26/2019   Procedure: LAPAROSCOPIC CHOLECYSTECTOMY;  Surgeon: Aviva Signs, MD;  Location: [facility];  Service: General;  Laterality: N/A;   LEFT HEART CATHETERIZATION WITH CORONARY ANGIOGRAM N/A 11/21/2011   Procedure: LEFT HEART CATHETERIZATION WITH CORONARY ANGIOGRAM;  Surgeon: Peter M Martinique, MD;  [facility] C[facility] LAB;  Service: Cardiovascular;  Laterality: N/A;   LYMPH NODE BIOPSY Right 08/26/2019   Procedure: LYMPH NODE BIOPSY, INGUINAL;  Surgeon: Aviva Signs, MD;  Location: [facility];  Service: General;  Laterality: Right;   PERCUTANEOUS CORONARY STENT INTERVENTION (PCI-S) N/A 11/21/2011   Procedure: PERCUTANEOUS CORONARY STENT INTERVENTION (PCI-S);  Surgeon: Peter M Martinique, MD;  Location: [facility] CATH LAB;  Service: Cardiovascular;  Laterality: N/A;   PORTACATH PLACEMENT Left 10/01/2019   Procedure: INSERTION PORT-A-CATH (catheter attached left subclavian);  Surgeon: Aviva Signs, MD;  Location: [facility];  Service: General;  Laterality: Left;   SPLENECTOMY      Family History  Problem Relation Age  of Onset   Malignant hyperthermia Mother    Cancer Mother    Malignant hyperthermia Brother    Heart disease Brother    Asthma Brother    COPD Brother    Heart disease Father    Cancer Father    ADD / ADHD Daughter    ADD / ADHD Son     Current Outpatient Medications on File Prior to Visit  Medication Sig Dispense Refill   albuterol (PROVENTIL HFA;VENTOLIN HFA) 108 (90 Base) MCG/ACT inhaler Inhale 2 puffs into the lungs every 6 (six) hours as needed for wheezing or shortness of breath.      cetirizine (ZYRTEC) 10 MG tablet Take 10 mg by mouth daily as needed for allergies.      lidocaine-prilocaine (EMLA) cream Apply 1 application topically as needed. (Patient not taking: Reported on 04/29/2021)     No current facility-administered medications on file prior to visit.    Allergies  Allergen Reactions   Penicillins Anaphylaxis    Did it involve swelling of the face/tongue/throat, SOB, or low BP? Yes Did it involve sudden or severe rash/hives, skin peeling, or any reaction on the inside of your mouth or nose? No Did you need to seek medical attention at a hospital or doctor's office? Yes When did it last happen?  Childhood allergy     If all above answers are "NO", may proceed with cephalosporin use.     Social History   Substance and Sexual Activity  Alcohol Use Yes   Comment: Occasionally  Social History   Tobacco Use  Smoking Status Every Day   Packs/day: 1.00   Types: Cigarettes  Smokeless Tobacco Never    Review of Systems  Constitutional: Negative.   HENT: Negative.    Eyes: Negative.   Respiratory: Negative.    Cardiovascular: Negative.   Gastrointestinal: Negative.   Genitourinary: Negative.   Musculoskeletal: Negative.   Skin: Negative.   Neurological: Negative.   Endo/Heme/Allergies: Negative.   Psychiatric/Behavioral: Negative.     Objective   Vitals:   05/20/21 0923  BP: 125/82  Pulse: 84  Resp: 16  Temp: 98.4 F (36.9 C)  SpO2: 95%     Physical Exam Vitals reviewed.  Constitutional:      Appearance: Normal appearance. He is not ill-appearing.  HENT:     Head: Normocephalic and atraumatic.  Cardiovascular:     Rate and Rhythm: Normal rate and regular rhythm.     Heart sounds: Normal heart sounds. No murmur heard.   No friction rub. No gallop.  Pulmonary:     Effort: Pulmonary effort is normal. No respiratory distress.     Breath sounds: Normal breath sounds. No stridor. No wheezing, rhonchi or rales.  Abdominal:     General: Bowel sounds are normal. There is no distension.     Palpations: Abdomen is soft.     Tenderness: There is no abdominal tenderness. There is no guarding or rebound.     Hernia: No hernia is present.     Comments: Palpable small right inguinal lymphadenopathy noted.  Skin:    General: Skin is warm and dry.  Neurological:     Mental Status: He is alert and oriented to person, place, and time.   PET scan images personally reviewed Oncology notes reviewed Assessment  Right inguinal lymphadenopathy, history of Hodgkin's lymphoma Plan  Patient is scheduled for right inguinal lymph node biopsy on 05/26/2021.  The risks and benefits of the procedure were fully explained to the patient, who gave informed consent.

## 2021-05-21 NOTE — Progress Notes (Signed)
Nathan Waters; [id number]; 1981-10-05   HPI Patient is a 40 year old white male who was referred to my care by oncology for a right inguinal lymph node biopsy.  He has a history of Hodgkin's lymphoma and has been treated in the past.  A recent PET scan revealed hypermetabolic lymph nodes in the right inguinal region. Past Medical History:  Diagnosis Date   Asthma    Coronary atherosclerosis of native coronary artery    BMS to mid circumflex 11/2011, LVEF 55-60%   Essential hypertension    Migraine    Mixed hyperlipidemia    STEMI (ST elevation myocardial infarction) ([place])    Complicated by VF arrest 11/2011    Past Surgical History:  Procedure Laterality Date   AXILLARY LYMPH NODE BIOPSY Left 07/26/2019   Procedure: AXILLARY LYMPH NODE BIOPSY;  Surgeon: Aviva Signs, MD;  Location: [facility];  Service: General;  Laterality: Left;   BACK SURGERY     CARDIAC CATHETERIZATION     CHOLECYSTECTOMY N/A 08/26/2019   Procedure: LAPAROSCOPIC CHOLECYSTECTOMY;  Surgeon: Aviva Signs, MD;  Location: [facility];  Service: General;  Laterality: N/A;   LEFT HEART CATHETERIZATION WITH CORONARY ANGIOGRAM N/A 11/21/2011   Procedure: LEFT HEART CATHETERIZATION WITH CORONARY ANGIOGRAM;  Surgeon: Peter M Martinique, MD;  Location: [facility] CATH LAB;  Service: Cardiovascular;  Laterality: N/A;   LYMPH NODE BIOPSY Right 08/26/2019   Procedure: LYMPH NODE BIOPSY, INGUINAL;  Surgeon: Aviva Signs, MD;  Location: [facility];  Service: General;  Laterality: Right;   PERCUTANEOUS CORONARY STENT INTERVENTION (PCI-S) N/A 11/21/2011   Procedure: PERCUTANEOUS CORONARY STENT INTERVENTION (PCI-S);  Surgeon: Peter M Martinique, MD;  Location: [facility] CATH LAB;  Service: Cardiovascular;  Laterality: N/A;   PORTACATH PLACEMENT Left 10/01/2019   Procedure: INSERTION PORT-A-CATH (catheter attached left subclavian);  Surgeon: Aviva Signs, MD;  Location: [facility];  Service: General;  Laterality: Left;   SPLENECTOMY      Family History  Problem Relation Age  of Onset   Malignant hyperthermia Mother    Cancer Mother    Malignant hyperthermia Brother    Heart disease Brother    Asthma Brother    COPD Brother    Heart disease Father    Cancer Father    ADD / ADHD Daughter    ADD / ADHD Son     Current Outpatient Medications on File Prior to Visit  Medication Sig Dispense Refill   albuterol (PROVENTIL HFA;VENTOLIN HFA) 108 (90 Base) MCG/ACT inhaler Inhale 2 puffs into the lungs every 6 (six) hours as needed for wheezing or shortness of breath.      cetirizine (ZYRTEC) 10 MG tablet Take 10 mg by mouth daily as needed for allergies.      lidocaine-prilocaine (EMLA) cream Apply 1 application topically as needed. (Patient not taking: Reported on 04/29/2021)     No current facility-administered medications on file prior to visit.    Allergies  Allergen Reactions   Penicillins Anaphylaxis    Did it involve swelling of the face/tongue/throat, SOB, or low BP? Yes Did it involve sudden or severe rash/hives, skin peeling, or any reaction on the inside of your mouth or nose? No Did you need to seek medical attention at a hospital or doctor's office? Yes When did it last happen?  Childhood allergy     If all above answers are "NO", may proceed with cephalosporin use.     Social History   Substance and Sexual Activity  Alcohol Use Yes   Comment: Occasionally  Social History   Tobacco Use  Smoking Status Every Day   Packs/day: 1.00   Types: Cigarettes  Smokeless Tobacco Never    Review of Systems  Constitutional: Negative.   HENT: Negative.    Eyes: Negative.   Respiratory: Negative.    Cardiovascular: Negative.   Gastrointestinal: Negative.   Genitourinary: Negative.   Musculoskeletal: Negative.   Skin: Negative.   Neurological: Negative.   Endo/Heme/Allergies: Negative.   Psychiatric/Behavioral: Negative.     Objective   Vitals:   05/20/21 0923  BP: 125/82  Pulse: 84  Resp: 16  Temp: 98.4 F (36.9 C)  SpO2: 95%     Physical Exam Vitals reviewed.  Constitutional:      Appearance: Normal appearance. He is not ill-appearing.  HENT:     Head: Normocephalic and atraumatic.  Cardiovascular:     Rate and Rhythm: Normal rate and regular rhythm.     Heart sounds: Normal heart sounds. No murmur heard.   No friction rub. No gallop.  Pulmonary:     Effort: Pulmonary effort is normal. No respiratory distress.     Breath sounds: Normal breath sounds. No stridor. No wheezing, rhonchi or rales.  Abdominal:     General: Bowel sounds are normal. There is no distension.     Palpations: Abdomen is soft.     Tenderness: There is no abdominal tenderness. There is no guarding or rebound.     Hernia: No hernia is present.     Comments: Palpable small right inguinal lymphadenopathy noted.  Skin:    General: Skin is warm and dry.  Neurological:     Mental Status: He is alert and oriented to person, place, and time.   PET scan images personally reviewed Oncology notes reviewed Assessment  Right inguinal lymphadenopathy, history of Hodgkin's lymphoma Plan  Patient is scheduled for right inguinal lymph node biopsy on 05/26/2021.  The risks and benefits of the procedure were fully explained to the patient, who gave informed consent.

## 2021-05-24 ENCOUNTER — Ambulatory Visit (HOSPITAL_COMMUNITY): Payer: Medicaid Other | Admitting: Hematology

## 2021-05-24 ENCOUNTER — Encounter (HOSPITAL_COMMUNITY)
Admission: RE | Admit: 2021-05-24 | Discharge: 2021-05-24 | Disposition: A | Discharge status: Home / Self Care | Payer: Medicaid Other | Source: Ambulatory Visit | Attending: General Surgery | Admitting: General Surgery

## 2021-05-24 ENCOUNTER — Other Ambulatory Visit: Payer: Self-pay

## 2021-05-26 ENCOUNTER — Other Ambulatory Visit: Payer: Self-pay

## 2021-05-26 ENCOUNTER — Ambulatory Visit (HOSPITAL_COMMUNITY): Payer: Medicaid Other | Admitting: Certified Registered"

## 2021-05-26 ENCOUNTER — Ambulatory Visit (HOSPITAL_COMMUNITY)
Admission: RE | Admit: 2021-05-26 | Discharge: 2021-05-26 | Disposition: A | Discharge status: Home / Self Care | Payer: Medicaid Other | Attending: General Surgery | Admitting: General Surgery

## 2021-05-26 ENCOUNTER — Encounter (HOSPITAL_COMMUNITY): Payer: Self-pay | Admitting: General Surgery

## 2021-05-26 ENCOUNTER — Encounter (HOSPITAL_COMMUNITY)
Admission: RE | Disposition: A | Discharge status: Home / Self Care | Payer: Self-pay | Source: Home / Self Care | Attending: General Surgery

## 2021-05-26 DIAGNOSIS — Z8571 Personal history of Hodgkin lymphoma: Secondary | ICD-10-CM | POA: Insufficient documentation

## 2021-05-26 DIAGNOSIS — Z880 Allergy status to penicillin: Secondary | ICD-10-CM | POA: Insufficient documentation

## 2021-05-26 DIAGNOSIS — R599 Enlarged lymph nodes, unspecified: Secondary | ICD-10-CM

## 2021-05-26 DIAGNOSIS — F17210 Nicotine dependence, cigarettes, uncomplicated: Secondary | ICD-10-CM | POA: Diagnosis not present

## 2021-05-26 DIAGNOSIS — Z8674 Personal history of sudden cardiac arrest: Secondary | ICD-10-CM | POA: Insufficient documentation

## 2021-05-26 DIAGNOSIS — C8108 Nodular lymphocyte predominant Hodgkin lymphoma, lymph nodes of multiple sites: Secondary | ICD-10-CM | POA: Diagnosis not present

## 2021-05-26 DIAGNOSIS — R590 Localized enlarged lymph nodes: Secondary | ICD-10-CM | POA: Insufficient documentation

## 2021-05-26 DIAGNOSIS — I2510 Atherosclerotic heart disease of native coronary artery without angina pectoris: Secondary | ICD-10-CM | POA: Diagnosis not present

## 2021-05-26 HISTORY — PX: INGUINAL LYMPH NODE BIOPSY: SHX5865

## 2021-05-26 SURGERY — BIOPSY, LYMPH NODE, INGUINAL, OPEN
Anesthesia: General | Laterality: Right

## 2021-05-26 MED ORDER — KETOROLAC TROMETHAMINE 30 MG/ML IJ SOLN: 30.0000 mg | Freq: Once | INTRAMUSCULAR | Status: DC | PRN

## 2021-05-26 MED ORDER — FENTANYL CITRATE (PF) 100 MCG/2ML IJ SOLN
INTRAMUSCULAR | Status: AC
  Filled 2021-05-26: qty 2

## 2021-05-26 MED ORDER — MEPERIDINE HCL 50 MG/ML IJ SOLN: 6.2500 mg | INTRAMUSCULAR | Status: DC | PRN

## 2021-05-26 MED ORDER — OXYCODONE-ACETAMINOPHEN 5-325 MG PO TABS: 1.0000 | ORAL_TABLET | ORAL | 0 refills | Status: DC | PRN

## 2021-05-26 MED ORDER — FENTANYL CITRATE (PF) 100 MCG/2ML IJ SOLN
INTRAMUSCULAR | Status: DC | PRN
  Administered 2021-05-26 (×2): 50 ug via INTRAVENOUS

## 2021-05-26 MED ORDER — CHLORHEXIDINE GLUCONATE CLOTH 2 % EX PADS: 6.0000 | MEDICATED_PAD | Freq: Once | CUTANEOUS | Status: DC

## 2021-05-26 MED ORDER — HYDROCODONE-ACETAMINOPHEN 7.5-325 MG PO TABS: 1.0000 | ORAL_TABLET | Freq: Once | ORAL | Status: DC | PRN

## 2021-05-26 MED ORDER — VANCOMYCIN HCL 1500 MG/300ML IV SOLN
1500.0000 mg | INTRAVENOUS | Status: AC
  Administered 2021-05-26: 1500 mg via INTRAVENOUS
  Filled 2021-05-26: qty 300

## 2021-05-26 MED ORDER — KETOROLAC TROMETHAMINE 30 MG/ML IJ SOLN
30.0000 mg | Freq: Once | INTRAMUSCULAR | Status: AC
  Administered 2021-05-26: 30 mg via INTRAVENOUS
  Filled 2021-05-26: qty 1

## 2021-05-26 MED ORDER — KETAMINE HCL 50 MG/5ML IJ SOSY
PREFILLED_SYRINGE | INTRAMUSCULAR | Status: AC
  Filled 2021-05-26: qty 5

## 2021-05-26 MED ORDER — BUPIVACAINE HCL (300 MG DOSE) 3 X 100 MG IL IMPL
DRUG_IMPLANT | Status: DC | PRN
  Administered 2021-05-26: 150 mg

## 2021-05-26 MED ORDER — DEXAMETHASONE SODIUM PHOSPHATE 4 MG/ML IJ SOLN
INTRAMUSCULAR | Status: DC | PRN
  Administered 2021-05-26: 5 mg via INTRAVENOUS

## 2021-05-26 MED ORDER — MIDAZOLAM HCL 5 MG/5ML IJ SOLN
INTRAMUSCULAR | Status: DC | PRN
  Administered 2021-05-26: 2 mg via INTRAVENOUS

## 2021-05-26 MED ORDER — PROPOFOL 10 MG/ML IV BOLUS
INTRAVENOUS | Status: DC | PRN
  Administered 2021-05-26: 200 mg via INTRAVENOUS

## 2021-05-26 MED ORDER — LIDOCAINE HCL (CARDIAC) PF 100 MG/5ML IV SOSY
PREFILLED_SYRINGE | INTRAVENOUS | Status: DC | PRN
  Administered 2021-05-26: 100 mg via INTRAVENOUS

## 2021-05-26 MED ORDER — ONDANSETRON HCL 4 MG/2ML IJ SOLN
INTRAMUSCULAR | Status: AC
  Filled 2021-05-26: qty 2

## 2021-05-26 MED ORDER — DEXAMETHASONE SODIUM PHOSPHATE 10 MG/ML IJ SOLN
INTRAMUSCULAR | Status: AC
  Filled 2021-05-26: qty 1

## 2021-05-26 MED ORDER — ONDANSETRON HCL 4 MG/2ML IJ SOLN: 4.0000 mg | Freq: Once | INTRAMUSCULAR | Status: DC | PRN

## 2021-05-26 MED ORDER — ONDANSETRON HCL 4 MG/2ML IJ SOLN
INTRAMUSCULAR | Status: DC | PRN
  Administered 2021-05-26: 4 mg via INTRAVENOUS

## 2021-05-26 MED ORDER — CHLORHEXIDINE GLUCONATE 0.12 % MT SOLN
15.0000 mL | Freq: Once | OROMUCOSAL | Status: AC
  Administered 2021-05-26: 15 mL via OROMUCOSAL

## 2021-05-26 MED ORDER — MIDAZOLAM HCL 2 MG/2ML IJ SOLN
INTRAMUSCULAR | Status: AC
  Filled 2021-05-26: qty 2

## 2021-05-26 MED ORDER — PROPOFOL 10 MG/ML IV BOLUS
INTRAVENOUS | Status: AC
  Filled 2021-05-26: qty 20

## 2021-05-26 MED ORDER — LACTATED RINGERS IV SOLN: INTRAVENOUS | Status: DC

## 2021-05-26 MED ORDER — 0.9 % SODIUM CHLORIDE (POUR BTL) OPTIME
TOPICAL | Status: DC | PRN
  Administered 2021-05-26: 1000 mL

## 2021-05-26 MED ORDER — VANCOMYCIN HCL IN DEXTROSE 1-5 GM/200ML-% IV SOLN
INTRAVENOUS | Status: AC
  Filled 2021-05-26: qty 200

## 2021-05-26 MED ORDER — KETAMINE HCL 10 MG/ML IJ SOLN
INTRAMUSCULAR | Status: DC | PRN
  Administered 2021-05-26: 10 mg via INTRAVENOUS
  Administered 2021-05-26: 20 mg via INTRAVENOUS

## 2021-05-26 MED ORDER — LIDOCAINE HCL (PF) 2 % IJ SOLN
INTRAMUSCULAR | Status: AC
  Filled 2021-05-26: qty 5

## 2021-05-26 MED ORDER — HYDROMORPHONE HCL 1 MG/ML IJ SOLN
0.2500 mg | INTRAMUSCULAR | Status: DC | PRN
  Administered 2021-05-26: 0.5 mg via INTRAVENOUS
  Filled 2021-05-26: qty 0.5

## 2021-05-26 SURGICAL SUPPLY — 36 items
ADH SKN CLS APL DERMABOND .7 (GAUZE/BANDAGES/DRESSINGS) ×1
APPLIER CLIP 9.375 SM OPEN (CLIP)
APR CLP SM 9.3 20 MLT OPN (CLIP)
CLIP APPLIE 9.375 SM OPEN (CLIP) IMPLANT
CLOTH BEACON ORANGE TIMEOUT ST (SAFETY) ×2 IMPLANT
CNTNR URN SCR LID CUP LEK RST (MISCELLANEOUS) ×1 IMPLANT
CONT SPEC 4OZ STRL OR WHT (MISCELLANEOUS) ×2
COVER LIGHT HANDLE STERIS (MISCELLANEOUS) ×4 IMPLANT
DECANTER SPIKE VIAL GLASS SM (MISCELLANEOUS) IMPLANT
DERMABOND ADVANCED (GAUZE/BANDAGES/DRESSINGS) ×1
DERMABOND ADVANCED .7 DNX12 (GAUZE/BANDAGES/DRESSINGS) ×1 IMPLANT
ELECT REM PT RETURN 9FT ADLT (ELECTROSURGICAL) ×2
ELECTRODE REM PT RTRN 9FT ADLT (ELECTROSURGICAL) ×1 IMPLANT
GAUZE 4X4 16PLY ~~LOC~~+RFID DBL (SPONGE) ×2 IMPLANT
GAUZE SPONGE 4X4 12PLY STRL (GAUZE/BANDAGES/DRESSINGS) ×2 IMPLANT
GLOVE SURG SS PI 7.5 STRL IVOR (GLOVE) ×2 IMPLANT
GLOVE SURG UNDER POLY LF SZ7 (GLOVE) ×4 IMPLANT
GOWN STRL REUS W/TWL LRG LVL3 (GOWN DISPOSABLE) ×4 IMPLANT
INST SET MINOR GENERAL (KITS) ×2 IMPLANT
KIT TURNOVER KIT A (KITS) ×2 IMPLANT
MANIFOLD NEPTUNE II (INSTRUMENTS) ×2 IMPLANT
NEEDLE HYPO 25X1 1.5 SAFETY (NEEDLE) ×2 IMPLANT
NS IRRIG 1000ML POUR BTL (IV SOLUTION) ×2 IMPLANT
PACK MINOR (CUSTOM PROCEDURE TRAY) ×2 IMPLANT
PAD ARMBOARD 7.5X6 YLW CONV (MISCELLANEOUS) ×2 IMPLANT
SET BASIN LINEN APH (SET/KITS/TRAYS/PACK) ×2 IMPLANT
SHEARS HARMONIC 9CM CVD (BLADE) ×2 IMPLANT
SOL PREP PROV IODINE SCRUB 4OZ (MISCELLANEOUS) ×2 IMPLANT
SPONGE INTESTINAL PEANUT (DISPOSABLE) IMPLANT
SUT MNCRL AB 4-0 PS2 18 (SUTURE) ×2 IMPLANT
SUT SILK 2 0 (SUTURE)
SUT SILK 2-0 18XBRD TIE 12 (SUTURE) IMPLANT
SUT VIC AB 3-0 SH 27 (SUTURE) ×2
SUT VIC AB 3-0 SH 27X BRD (SUTURE) ×1 IMPLANT
SUT VICRYL AB 3 0 TIES (SUTURE) IMPLANT
SYR CONTROL 10ML LL (SYRINGE) ×2 IMPLANT

## 2021-05-26 NOTE — Op Note (Signed)
Patient:  MAVRYK KICE  DOB:  1981/01/24  MRN:  [id number]   Preop Diagnosis: History of Hodgkin's lymphoma, right inguinal lymphadenopathy  Postop Diagnosis: Same  Procedure: Right inguinal lymph node biopsy  Surgeon: Aviva Signs, MD  Anes: General  Indications: Patient is a 40 year old white male with a history of Hodgkin's lymphoma who presents for a right inguinal lymph node biopsy due to enlarged lymph nodes.  The risks and benefits of the procedure including bleeding and infection were fully explained to the patient, who gave informed consent.  Procedure note: The patient was placed in the supine position.  After general anesthesia was administered, the right groin region was prepped and draped using usual sterile technique with ChloraPrep.  Surgical site confirmation was performed.  An incision was made in the right groin region.  This was taken down to the subcutaneous tissue.  Several small lymph nodes were found.  Using the harmonic scalpel, they were excised without difficulty.  They were sent to pathology for flow cytometry.  A bleeding was controlled using Bovie electrocautery.  Robynn Pane was placed both in the deep subcutaneous tissue and underneath the skin.  The subcutaneous layer was reapproximated using 3-0 Vicryl interrupted sutures.  The skin was closed using a 4-0 Monocryl subcuticular suture.  Dermabond was applied.  All tape and needle counts were correct at the end of the procedure.  The patient was awakened and transferred to PACU in stable condition.  Complications: None  EBL: Minimal  Specimen: Right inguinal lymph nodes

## 2021-05-26 NOTE — Anesthesia Procedure Notes (Signed)
Procedure Name: LMA Insertion Date/Time: 05/26/2021 8:35 AM Performed by: Tacy Learn, CRNA Pre-anesthesia Checklist: Patient identified, Emergency Drugs available, Suction available, Patient being monitored and Timeout performed Patient Re-evaluated:Patient Re-evaluated prior to induction Oxygen Delivery Method: Circle system utilized Preoxygenation: Pre-oxygenation with 100% oxygen Induction Type: IV induction LMA: LMA inserted LMA Size: 4.0 Number of attempts: 1 Placement Confirmation: positive ETCO2, CO2 detector and breath sounds checked- equal and bilateral Tube secured with: Tape Dental Injury: Teeth and Oropharynx as per pre-operative assessment

## 2021-05-26 NOTE — Anesthesia Preprocedure Evaluation (Addendum)
Anesthesia Evaluation   Patient awake    Reviewed: Allergy & Precautions, NPO status , Patient's Chart, lab work & pertinent test results  Airway Mallampati: III  TM Distance: >3 FB Neck ROM: Full    Dental  (+) Poor Dentition, Missing   Pulmonary asthma , Current SmokerPatient did not abstain from smoking.,    Pulmonary exam normal breath sounds clear to auscultation       Cardiovascular Exercise Tolerance: Good hypertension, + CAD and + Past MI  Normal cardiovascular examI Rhythm:Regular Rate:Normal  Reports MI 2013 Denies NTG use in over a year  Reports can walk a mile without issues    Neuro/Psych  Headaches, negative psych ROS   GI/Hepatic negative GI ROS, Neg liver ROS,   Endo/Other  negative endocrine ROS  Renal/GU negative Renal ROS  negative genitourinary   Musculoskeletal negative musculoskeletal ROS (+)   Abdominal   Peds negative pediatric ROS (+)  Hematology negative hematology ROS (+)   Anesthesia Other Findings Hodgkin lymphoma  Reproductive/Obstetrics negative OB ROS                            Anesthesia Physical  Anesthesia Plan  ASA: 3  Anesthesia Plan: General   Post-op Pain Management:    Induction: Intravenous  PONV Risk Score and Plan:   Airway Management Planned: Nasal Cannula and LMA  Additional Equipment:   Intra-op Plan:   Post-operative Plan: Extubation in OR  Informed Consent: I have reviewed the patients History and Physical, chart, labs and discussed the procedure including the risks, benefits and alternatives for the proposed anesthesia with the patient or authorized representative who has indicated his/her understanding and acceptance.     Dental advisory given  Plan Discussed with: CRNA  Anesthesia Plan Comments: ( TIVA with general back up)       Anesthesia Quick Evaluation

## 2021-05-26 NOTE — Anesthesia Postprocedure Evaluation (Signed)
Anesthesia Post Note  Patient: Nathan Waters  Procedure(s) Performed: INGUINAL LYMPH NODE BIOPSY (Right)  Anesthesia Type: General Anesthetic complications: no   No notable events documented.   Last Vitals:  Vitals:   05/26/21 1000 05/26/21 1015  BP: 128/86 130/84  Pulse: 77 76  Resp: 17 16  Temp:  36.6 C  SpO2: 94% 94%    Last Pain:  Vitals:   05/26/21 1015  TempSrc: Oral  PainSc: [place]

## 2021-05-26 NOTE — Interval H&P Note (Signed)
History and Physical Interval Note:  05/26/2021 8:05 AM  Nathan Waters  has presented today for surgery, with the diagnosis of Hodgkin's disease C81.08.  The various methods of treatment have been discussed with the patient and family. After consideration of risks, benefits and other options for treatment, the patient has consented to  Procedure(s): INGUINAL LYMPH NODE BIOPSY (Right) as a surgical intervention.  The patient's history has been reviewed, patient examined, no change in status, stable for surgery.  I have reviewed the patient's chart and labs.  Questions were answered to the patient's satisfaction.     Aviva Signs

## 2021-05-26 NOTE — Transfer of Care (Signed)
Immediate Anesthesia Transfer of Care Note  Patient: RYU ASEBEDO  Procedure(s) Performed: INGUINAL LYMPH NODE BIOPSY (Right)  Patient Location: PACU  Anesthesia Type:General  Level of Consciousness: awake, alert , oriented and patient cooperative  Airway & Oxygen Therapy: Patient Spontanous Breathing and Patient connected to face mask oxygen  Post-op Assessment: Report given to RN, Post -op Vital signs reviewed and stable and Patient moving all extremities X 4  Post vital signs: Reviewed and stable  Last Vitals:  Vitals Value Taken Time  BP    Temp    Pulse 104 05/26/21 0908  Resp 10 05/26/21 0908  SpO2 96 % 05/26/21 0908  Vitals shown include unvalidated device data.  Last Pain:  Vitals:   05/26/21 0734  TempSrc: Oral  PainSc: 0-No pain      Patients Stated Pain Goal: 6 ([id number])  Complications: No notable events documented.

## 2021-05-27 ENCOUNTER — Encounter (HOSPITAL_COMMUNITY): Payer: Self-pay | Admitting: General Surgery

## 2021-05-28 LAB — SURGICAL PATHOLOGY

## 2021-05-31 ENCOUNTER — Ambulatory Visit (HOSPITAL_COMMUNITY): Payer: Medicaid Other | Admitting: Hematology

## 2021-06-02 NOTE — Progress Notes (Signed)
Nathan Waters,  [id number]   CLINIC:  Medical Oncology/Hematology  PCP:  Sofie Rower, PA-C [address] / [place] [address] [phone]   REASON FOR VISIT:  Follow-up for Hodgkin's lymphoma  PRIOR THERAPY: R-CHOP x 6 cycles from 10/22/2019 to 02/04/2020  NGS Results: not done  CURRENT THERAPY: surveillance  BRIEF ONCOLOGIC HISTORY:  Oncology History  Hodgkin lymphoma ([place])  09/18/2019 Initial Diagnosis   Nodular lymphocyte predom Hodgkin lymphoma lymph nodes multiple sites (Nathan Waters)   09/18/2019 Cancer Staging   Staging form: Hodgkin and Non-Hodgkin Lymphoma, AJCC 8th Edition - Clinical stage from 09/18/2019: Stage III (Hodgkin lymphoma) - Signed by Derek Jack, MD on 09/18/2019   10/22/2019 -  Chemotherapy   The patient had DOXOrubicin (ADRIAMYCIN) chemo injection 124 mg, 50 mg/m2 = 124 mg, Intravenous,  Once, 6 of 6 cycles Administration: 124 mg (11/12/2019), 124 mg (10/23/2019), 124 mg (12/03/2019), 124 mg (12/24/2019), 124 mg (01/14/2020), 124 mg (02/04/2020) palonosetron (ALOXI) injection 0.25 mg, 0.25 mg, Intravenous,  Once, 6 of 6 cycles Administration: 0.25 mg (11/12/2019), 0.25 mg (10/23/2019), 0.25 mg (12/03/2019), 0.25 mg (12/24/2019), 0.25 mg (01/14/2020), 0.25 mg (02/04/2020) pegfilgrastim-cbqv (UDENYCA) injection 6 mg, 6 mg, Subcutaneous, Once, 6 of 6 cycles Administration: 6 mg (10/24/2019), 6 mg (11/14/2019), 6 mg (12/04/2019), 6 mg (12/26/2019), 6 mg (01/16/2020) vinCRIStine (ONCOVIN) 2 mg in sodium chloride 0.9 % 50 mL chemo infusion, 2 mg, Intravenous,  Once, 6 of 6 cycles Dose modification: 1 mg (50 % of original dose 2 mg, Cycle 3, Reason: Other (see comments), Comment: neuropathy) Administration: 2 mg (11/12/2019), 2 mg (10/23/2019), 1 mg (12/03/2019), 1 mg (12/24/2019), 1 mg (01/14/2020), 1 mg (02/04/2020) cyclophosphamide (CYTOXAN) 1,840 mg in sodium chloride 0.9 % 250 mL chemo infusion, 750 mg/m2 = 1,840 mg, Intravenous,   Once, 6 of 6 cycles Administration: 1,840 mg (11/12/2019), 1,840 mg (10/23/2019), 1,840 mg (12/03/2019), 1,840 mg (12/24/2019), 1,840 mg (01/14/2020), 1,840 mg (02/04/2020) fosaprepitant (EMEND) 150 mg in sodium chloride 0.9 % 145 mL IVPB, 150 mg, Intravenous,  Once, 6 of 6 cycles Administration: 150 mg (11/12/2019), 150 mg (10/23/2019), 150 mg (12/03/2019), 150 mg (12/24/2019), 150 mg (01/14/2020), 150 mg (02/04/2020) riTUXimab-pvvr (RUXIENCE) 100 mg in sodium chloride 0.9 % 90 mL (1 mg/mL) infusion, 100 mg (100 % of original dose 100 mg), Intravenous,  Once, 6 of 6 cycles Dose modification: 800 mg (original dose 375 mg/m2, Cycle 1, Reason: Provider Judgment, Comment: test dose of 100 mg given then remainder 800 mg = 900 mg), 100 mg (original dose 100 mg, Cycle 1) Administration: 800 mg (10/22/2019), 900 mg (11/12/2019), 100 mg (10/22/2019), 900 mg (12/03/2019), 900 mg (12/24/2019), 900 mg (01/14/2020), 900 mg (02/04/2020)   for chemotherapy treatment.       CANCER STAGING: Cancer Staging Hodgkin lymphoma [facility]) Staging form: Hodgkin and Non-Hodgkin Lymphoma, AJCC 8th Edition - Clinical stage from 09/18/2019: Stage III (Hodgkin lymphoma) - Signed by Derek Jack, MD on 09/18/2019   INTERVAL HISTORY:  [name], a 40 y.o. male, returns for routine follow-up of his Hodgkin's lymphoma. [name] was last seen on 12/29/20.   Today he reports feeling well. He denies tingling/numbness, night sweats, and weight loss.  REVIEW OF SYSTEMS:  Review of Systems  Constitutional:  Negative for appetite change, fatigue and unexpected weight change.  Gastrointestinal:  Positive for abdominal pain (5/10 mid section).  Neurological:  Negative for numbness.  All other systems reviewed and are negative.  PAST MEDICAL/SURGICAL  HISTORY:  Past Medical History:  Diagnosis Date   Asthma    Coronary atherosclerosis of native coronary artery    BMS to mid circumflex 11/2011, LVEF 55-60%   Essential hypertension     Migraine    Mixed hyperlipidemia    STEMI (ST elevation myocardial infarction) ([place])    Complicated by VF arrest 11/2011   Past Surgical History:  Procedure Laterality Date   AXILLARY LYMPH NODE BIOPSY Left 07/26/2019   Procedure: AXILLARY LYMPH NODE BIOPSY;  Surgeon: Aviva Signs, MD;  Location: [facility];  Service: General;  Laterality: Left;   BACK SURGERY     CARDIAC CATHETERIZATION     CHOLECYSTECTOMY N/A 08/26/2019   Procedure: LAPAROSCOPIC CHOLECYSTECTOMY;  Surgeon: Aviva Signs, MD;  Location: [facility];  Service: General;  Laterality: N/A;   INGUINAL LYMPH NODE BIOPSY Right 05/26/2021   Procedure: INGUINAL LYMPH NODE BIOPSY;  Surgeon: Aviva Signs, MD;  Location: [facility];  Service: General;  Laterality: Right;   LEFT HEART CATHETERIZATION WITH CORONARY ANGIOGRAM N/A 11/21/2011   Procedure: LEFT HEART CATHETERIZATION WITH CORONARY ANGIOGRAM;  Surgeon: Peter M Martinique, MD;  Location: [facility] CATH LAB;  Service: Cardiovascular;  Laterality: N/A;   LYMPH NODE BIOPSY Right 08/26/2019   Procedure: LYMPH NODE BIOPSY, INGUINAL;  Surgeon: Aviva Signs, MD;  Location: [facility];  Service: General;  Laterality: Right;   PERCUTANEOUS CORONARY STENT INTERVENTION (PCI-S) N/A 11/21/2011   Procedure: PERCUTANEOUS CORONARY STENT INTERVENTION (PCI-S);  Surgeon: Peter M Martinique, MD;  Location: [facility] CATH LAB;  Service: Cardiovascular;  Laterality: N/A;   PORTACATH PLACEMENT Left 10/01/2019   Procedure: INSERTION PORT-A-CATH (catheter attached left subclavian);  Surgeon: Aviva Signs, MD;  Location: [facility];  Service: General;  Laterality: Left;   SPLENECTOMY      SOCIAL HISTORY:  Social History   Socioeconomic History   Marital status: Divorced    Spouse name: Not on file   Number of children: 2   Years of education: Not on file   Highest education level: Not on file  Occupational History   Occupation: not employed  Tobacco Use   Smoking status: Every Day    Packs/day: 1.00    Types: Cigarettes   Smokeless  tobacco: Never  Vaping Use   Vaping Use: Former   Start date: 04/27/2012  Substance and Sexual Activity   Alcohol use: Yes    Comment: Occasionally   Drug use: No   Sexual activity: Yes    Birth control/protection: Rhythm  Other Topics Concern   Not on file  Social History Narrative   Not on file   Social Determinants of Health   Financial Resource Strain: Low Risk    Difficulty of Paying Living Expenses: Not hard at all  Food Insecurity: No Food Insecurity   Worried About Charity fundraiser in the Last Year: Never true   [place] in the Last Year: Never true  Transportation Needs: No Transportation Needs   Lack of Transportation (Medical): No   Lack of Transportation (Non-Medical): No  Physical Activity: Not on file  Stress: No Stress Concern Present   Feeling of Stress : Not at all  Social Connections: Socially Isolated   Frequency of Communication with Friends and Family: More than three times a week   Frequency of Social Gatherings with Friends and Family: Three times a week   Attends Religious Services: Never   Active Member of Clubs or Organizations: No   Attends Archivist Meetings: Never  Marital Status: Divorced  Human resources officer Violence: Not At Risk   Fear of Current or Ex-Partner: No   Emotionally Abused: No   Physically Abused: No   Sexually Abused: No    FAMILY HISTORY:  Family History  Problem Relation Age of Onset   Malignant hyperthermia Mother    Cancer Mother    Malignant hyperthermia Brother    Heart disease Brother    Asthma Brother    COPD Brother    Heart disease Father    Cancer Father    ADD / ADHD Daughter    ADD / ADHD Son     CURRENT MEDICATIONS:  Current Outpatient Medications  Medication Sig Dispense Refill   albuterol (PROVENTIL HFA;VENTOLIN HFA) 108 (90 Base) MCG/ACT inhaler Inhale 2 puffs into the lungs every 6 (six) hours as needed for wheezing or shortness of breath.      cetirizine (ZYRTEC) 10 MG tablet  Take 10 mg by mouth daily as needed for allergies.      oxyCODONE-acetaminophen (PERCOCET) 5-325 MG tablet Take 1 tablet by mouth every 4 (four) hours as needed for severe pain. 20 tablet 0   No current facility-administered medications for this visit.    ALLERGIES:  Allergies  Allergen Reactions   Penicillins Anaphylaxis    Did it involve swelling of the face/tongue/throat, SOB, or low BP? Yes Did it involve sudden or severe rash/hives, skin peeling, or any reaction on the inside of your mouth or nose? No Did you need to seek medical attention at a hospital or doctor's office? Yes When did it last happen?  Childhood allergy     If all above answers are "NO", may proceed with cephalosporin use.     PHYSICAL EXAM:  Performance status (ECOG): 0 - Asymptomatic  There were no vitals filed for this visit. Wt Readings from Last 3 Encounters:  05/26/21 279 lb (126.6 kg)  05/20/21 279 lb (126.6 kg)  04/29/21 284 lb 2.8 oz (128.9 kg)   Physical Exam Vitals reviewed.  Constitutional:      Appearance: Normal appearance.  Cardiovascular:     Rate and Rhythm: Normal rate and regular rhythm.     Pulses: Normal pulses.     Heart sounds: Normal heart sounds.  Pulmonary:     Effort: Pulmonary effort is normal.     Breath sounds: Normal breath sounds.  Neurological:     General: No focal deficit present.     Mental Status: He is alert and oriented to person, place, and time.  Psychiatric:        Mood and Affect: Mood normal.        Behavior: Behavior normal.     LABORATORY DATA:  I have reviewed the labs as listed.  CBC Latest Ref Rng & Units 04/06/2021 12/22/2020 09/23/2020  WBC 4.0 - 10.5 K/uL 11.4(H) 11.0(H) 8.4  Hemoglobin 13.0 - 17.0 g/dL 15.5 15.0 16.0  Hematocrit 39.0 - 52.0 % 45.7 45.0 47.5  Platelets 150 - 400 K/uL 311 309 324   CMP Latest Ref Rng & Units 04/06/2021 12/22/2020 09/23/2020  Glucose 70 - 99 mg/dL 116(H) 141(H) 125(H)  BUN 6 - 20 mg/dL '8 8 8  '$ Creatinine 0.61 -  1.24 mg/dL 0.74 0.77 0.91  Sodium 135 - 145 mmol/L 138 139 139  Potassium 3.5 - 5.1 mmol/L 3.8 3.6 4.7  Chloride 98 - 111 mmol/L 107 110 105  CO2 22 - 32 mmol/L 26 21(L) 27  Calcium 8.9 - 10.3 mg/dL 9.0 8.9 9.8  Total Protein 6.5 - 8.1 g/dL 6.8 6.6 7.2  Total Bilirubin 0.3 - 1.2 mg/dL 0.8 0.5 0.6  Alkaline Phos 38 - 126 U/L 98 105 96  AST 15 - 41 U/L '30 27 26  '$ ALT 0 - 44 U/L 40 39 39    DIAGNOSTIC IMAGING:  I have independently reviewed the scans and discussed with the patient. No results found.   ASSESSMENT:  1.  NLP Hodgkin's disease: -6 cycles of R-CHOP from 10/23/2019 through 02/04/2020. -PET scan on 03/25/2020 showed most of the lymph nodes with Deauville 2 activity with normal size limits.  2 of the right external iliac lymph nodes remain mildly enlarged.  Right thigh mass also normalized.   PLAN:  1.  NLP Hodgkin's disease: - PET scan on 04/23/2021 showed right external iliac node 1.2 cm SUV 3.1.  Right external iliac lymph node 1.8 cm, SUV 4.7.  New hypermetabolic enlarged right inguinal nodes measuring 1.4 cm, SUV 4.2 and another lymph node 1.2 cm with SUV 5.6. - He does not have any B symptoms.  Labs are grossly within normal limits. - We discussed pathology report from 05/26/2021.  Right inguinal lymph node biopsy did not show any evidence of recurrent disease.  It showed benign findings. - I will plan to see him back in 6 months with repeat PET scan and labs.   2.  Peripheral neuropathy: - This has completely resolved.   Orders placed this encounter:  No orders of the defined types were placed in this encounter.    Derek Jack, MD [place] [phone]   I, Thana Ates, am acting as a scribe for Dr. Derek Jack.  I, Derek Jack MD, have reviewed the above documentation for accuracy and completeness, and I agree with the above.

## 2021-06-03 ENCOUNTER — Other Ambulatory Visit: Payer: Self-pay

## 2021-06-03 ENCOUNTER — Inpatient Hospital Stay (HOSPITAL_COMMUNITY): Payer: Medicaid Other | Attending: Hematology | Admitting: Hematology

## 2021-06-03 ENCOUNTER — Telehealth (INDEPENDENT_AMBULATORY_CARE_PROVIDER_SITE_OTHER): Payer: Medicaid Other | Admitting: General Surgery

## 2021-06-03 VITALS — BP 122/89 | HR 95 | Temp 98.6°F | Resp 18 | Wt 275.4 lb

## 2021-06-03 DIAGNOSIS — G629 Polyneuropathy, unspecified: Secondary | ICD-10-CM | POA: Diagnosis not present

## 2021-06-03 DIAGNOSIS — Z09 Encounter for follow-up examination after completed treatment for conditions other than malignant neoplasm: Secondary | ICD-10-CM

## 2021-06-03 DIAGNOSIS — C8190 Hodgkin lymphoma, unspecified, unspecified site: Secondary | ICD-10-CM | POA: Diagnosis not present

## 2021-06-03 DIAGNOSIS — C8108 Nodular lymphocyte predominant Hodgkin lymphoma, lymph nodes of multiple sites: Secondary | ICD-10-CM | POA: Insufficient documentation

## 2021-06-03 NOTE — Telephone Encounter (Signed)
Postoperative telephone visit performed with patient.  He states he is doing well.  He has a visit with oncology today.  I told him the biopsy results were negative for malignancy.  He was told to follow-up with me as needed.  As this was a part of the global surgical fee, this was not a billable visit.  Total telephone time was 2 minutes.

## 2021-06-03 NOTE — Patient Instructions (Addendum)
[place] at [facility] Discharge Instructions  You were seen today by Dr. Delton Coombes. He went over your recent results and scans. You will be scheduled for a PET scan prior to your next appointment. Dr. Delton Coombes will see you back in 6 months for labs and follow up.   Thank you for choosing [place] at [facility] to provide your oncology and hematology care.  To afford each patient quality time with our provider, please arrive at least 15 minutes before your scheduled appointment time.   If you have a lab appointment with the [place] please come in thru the Main Entrance and check in at the main information desk  You need to re-schedule your appointment should you arrive 10 or more minutes late.  We strive to give you quality time with our providers, and arriving late affects you and other patients whose appointments are after yours.  Also, if you no show three or more times for appointments you may be dismissed from the clinic at the providers discretion.     Again, thank you for choosing [facility].  Our hope is that these requests will decrease the amount of time that you wait before being seen by our physicians.       _____________________________________________________________  Should you have questions after your visit to [facility], please contact our office at [phone] between the hours of 8:00 a.m. and 4:30 p.m.  Voicemails left after 4:00 p.m. will not be returned until the following business day.  For prescription refill requests, have your pharmacy contact our office and allow 72 hours.    Cancer Center Support Programs:   > Cancer Support Group  2nd Tuesday of the month 1pm-2pm, Journey Room

## 2021-06-04 ENCOUNTER — Encounter (HOSPITAL_COMMUNITY): Payer: Self-pay | Admitting: Hematology

## 2021-06-08 ENCOUNTER — Telehealth: Payer: Self-pay | Admitting: Family Medicine

## 2021-06-08 NOTE — Telephone Encounter (Signed)
Pt called stating that he is having some increased drainage, swelling and pain at surgical site.  I asked how much and what color was the drainage - pt did not know as he states it is covered up but it is not coming through the bandage. He is equally not sure about how much swelling he has either as again it is covered up. He would like something for pain.  Per Dr. Arnoldo Morale - needs to be seen before he can give any further pain medication and also for his drainage and swelling.  Pt verbalized understanding and apt made.

## 2021-06-10 ENCOUNTER — Encounter: Payer: Medicaid Other | Admitting: General Surgery

## 2021-06-10 ENCOUNTER — Other Ambulatory Visit: Payer: Self-pay

## 2021-06-10 ENCOUNTER — Ambulatory Visit (INDEPENDENT_AMBULATORY_CARE_PROVIDER_SITE_OTHER): Payer: Medicaid Other | Admitting: General Surgery

## 2021-06-10 ENCOUNTER — Encounter: Payer: Self-pay | Admitting: General Surgery

## 2021-06-10 VITALS — BP 123/81 | HR 77 | Temp 98.5°F | Resp 18 | Ht 71.0 in | Wt 272.0 lb

## 2021-06-10 DIAGNOSIS — Z09 Encounter for follow-up examination after completed treatment for conditions other than malignant neoplasm: Secondary | ICD-10-CM

## 2021-06-10 NOTE — Progress Notes (Signed)
Subjective:     Nathan Waters  Here for follow-up, status post right inguinal lymph node biopsy.  He is being followed by Dr. Delton Coombes.  He states he has some swelling at the incision site.  No drainage has been noted.  It is somewhat tender to touch as it is swollen. Objective:    BP 123/81   Pulse 77   Temp 98.5 F (36.9 C) (Other (Comment))   Resp 18   Ht '5\' 11"'$  (1.803 m)   Wt 272 lb (123.4 kg)   SpO2 95%   BMI 37.94 kg/m   General:  alert, cooperative, and no distress  Right inguinal incision has healed well.  A seromatous collection is present.  40 cc of clear yellow fluid was aspirated.  Patient tolerated the procedure well.     Assessment:    Postoperative seroma, resolved with aspiration.  No infection present.    Plan:   Patient was told to return should the swelling recur.  Follow-up as needed otherwise.

## 2021-06-15 ENCOUNTER — Encounter: Payer: Medicaid Other | Admitting: General Surgery

## 2021-06-24 ENCOUNTER — Other Ambulatory Visit: Payer: Self-pay

## 2021-06-24 ENCOUNTER — Ambulatory Visit (INDEPENDENT_AMBULATORY_CARE_PROVIDER_SITE_OTHER): Payer: Medicaid Other | Admitting: General Surgery

## 2021-06-24 ENCOUNTER — Encounter: Payer: Self-pay | Admitting: General Surgery

## 2021-06-24 VITALS — BP 111/79 | HR 112 | Temp 98.0°F | Resp 18 | Wt 276.0 lb

## 2021-06-24 DIAGNOSIS — Z09 Encounter for follow-up examination after completed treatment for conditions other than malignant neoplasm: Secondary | ICD-10-CM

## 2021-06-24 MED ORDER — SULFAMETHOXAZOLE-TRIMETHOPRIM 800-160 MG PO TABS: 1.0000 | ORAL_TABLET | Freq: Two times a day (BID) | ORAL | 0 refills | Status: DC

## 2021-06-24 NOTE — Progress Notes (Signed)
Subjective:     Nathan Waters  Patient here for a wound check.  States he has started having some yellowish drainage from his incision.  There was surrounding erythema at the incision site.  He states he had 1 episode of greenish drainage, but that has gone away.  He denies any fever or chills.  It is tender to touch. Objective:    BP 111/79   Pulse (!) 112   Temp 98 F (36.7 C) (Temporal)   Resp 18   Wt 276 lb (125.2 kg)   SpO2 94%   BMI 38.49 kg/m   General:  alert, cooperative, and no distress  Right groin incision is slightly erythematous with a punctate hole present laterally with clear yellow drainage.  No abscess or significant induration is noted.     Assessment:    Seroma drainage from wound.  It does not appear to be grossly infected, although there is some erythema around the wound.    Plan:   I told the patient to keep the wound clean and dry.  He should continue to expect drainage from the wound.  Bactrim DS 1 tablet p.o. twice daily for 1 week has been prescribed.  I told him to follow-up with me should the wound worsen.  He understands and agrees.

## 2021-07-09 ENCOUNTER — Encounter (HOSPITAL_COMMUNITY): Payer: Medicaid Other

## 2021-07-09 ENCOUNTER — Inpatient Hospital Stay (HOSPITAL_COMMUNITY): Payer: Medicaid Other | Attending: Hematology

## 2021-07-09 ENCOUNTER — Other Ambulatory Visit: Payer: Self-pay

## 2021-07-09 DIAGNOSIS — C8108 Nodular lymphocyte predominant Hodgkin lymphoma, lymph nodes of multiple sites: Secondary | ICD-10-CM | POA: Insufficient documentation

## 2021-07-09 DIAGNOSIS — Z452 Encounter for adjustment and management of vascular access device: Secondary | ICD-10-CM | POA: Diagnosis present

## 2021-07-09 MED ORDER — HEPARIN SOD (PORK) LOCK FLUSH 100 UNIT/ML IV SOLN
500.0000 [IU] | Freq: Once | INTRAVENOUS | Status: AC
  Administered 2021-07-09: 500 [IU] via INTRAVENOUS

## 2021-07-09 MED ORDER — SODIUM CHLORIDE 0.9% FLUSH
10.0000 mL | Freq: Once | INTRAVENOUS | Status: AC
  Administered 2021-07-09: 10 mL via INTRAVENOUS

## 2021-07-09 NOTE — Progress Notes (Signed)
Patients port flushed without difficulty.  Good blood return noted with no bruising or swelling noted at site.  Band aid applied.  VSS with discharge and left in satisfactory condition with no s/s of distress noted.  Discharge from clinic ambulatory in stable condition.  Alert and oriented X 3.  Follow up with [facility] as scheduled.  °

## 2021-07-09 NOTE — Patient Instructions (Signed)
[facility]  Discharge Instructions: °Thank you for choosing [facility] to provide your oncology and hematology care.  °If you have a lab appointment with the Cancer Center, please come in thru the Main Entrance and check in at the main information desk. ° °Wear comfortable clothing and clothing appropriate for easy access to any Portacath or PICC line.  ° °We strive to give you quality time with your provider. You may need to reschedule your appointment if you arrive late (15 or more minutes).  Arriving late affects you and other patients whose appointments are after yours.  Also, if you miss three or more appointments without notifying the office, you may be dismissed from the clinic at the provider’s discretion.    °  °For prescription refill requests, have your pharmacy contact our office and allow 72 hours for refills to be completed.   ° °Today you received the following chemotherapy and/or immunotherapy agents PORT flush    °  °To help prevent nausea and vomiting after your treatment, we encourage you to take your nausea medication as directed. ° °BELOW ARE SYMPTOMS THAT SHOULD BE REPORTED IMMEDIATELY: °*FEVER GREATER THAN 100.4 F (38 °C) OR HIGHER °*CHILLS OR SWEATING °*NAUSEA AND VOMITING THAT IS NOT CONTROLLED WITH YOUR NAUSEA MEDICATION °*UNUSUAL SHORTNESS OF BREATH °*UNUSUAL BRUISING OR BLEEDING °*URINARY PROBLEMS (pain or burning when urinating, or frequent urination) °*BOWEL PROBLEMS (unusual diarrhea, constipation, pain near the anus) °TENDERNESS IN MOUTH AND THROAT WITH OR WITHOUT PRESENCE OF ULCERS (sore throat, sores in mouth, or a toothache) °UNUSUAL RASH, SWELLING OR PAIN  °UNUSUAL VAGINAL DISCHARGE OR ITCHING  ° °Items with * indicate a potential emergency and should be followed up as soon as possible or go to the Emergency Department if any problems should occur. ° °Please show the CHEMOTHERAPY ALERT CARD or IMMUNOTHERAPY ALERT CARD at check-in to the Emergency  Department and triage nurse. ° °Should you have questions after your visit or need to cancel or reschedule your appointment, please contact [facility] [phone]  and follow the prompts.  Office hours are 8:00 a.m. to 4:30 p.m. Monday - Friday. Please note that voicemails left after 4:00 p.m. may not be returned until the following business day.  We are closed weekends and major holidays. You have access to a nurse at all times for urgent questions. Please call the main number to the clinic [phone] and follow the prompts. ° °For any non-urgent questions, you may also contact your provider using MyChart. We now offer e-Visits for anyone 18 and older to request care online for non-urgent symptoms. For details visit [email or web address]. °  °Also download the MyChart app! Go to the app store, search "MyChart", open the app, select [place], and log in with your MyChart username and password. ° °Due to Covid, a mask is required upon entering the hospital/clinic. If you do not have a mask, one will be given to you upon arrival. For doctor visits, patients may have 1 support person aged 18 or older with them. For treatment visits, patients cannot have anyone with them due to current Covid guidelines and our immunocompromised population.  °

## 2021-09-25 ENCOUNTER — Encounter (HOSPITAL_COMMUNITY): Payer: Self-pay | Admitting: Emergency Medicine

## 2021-09-25 ENCOUNTER — Other Ambulatory Visit: Payer: Self-pay

## 2021-09-25 ENCOUNTER — Emergency Department (HOSPITAL_COMMUNITY)
Admission: EM | Admit: 2021-09-25 | Discharge: 2021-09-26 | Disposition: A | Discharge status: Home / Self Care | Payer: Medicaid Other | Attending: Emergency Medicine | Admitting: Emergency Medicine

## 2021-09-25 ENCOUNTER — Emergency Department (HOSPITAL_COMMUNITY): Payer: Medicaid Other

## 2021-09-25 DIAGNOSIS — R0789 Other chest pain: Secondary | ICD-10-CM | POA: Diagnosis not present

## 2021-09-25 DIAGNOSIS — I10 Essential (primary) hypertension: Secondary | ICD-10-CM | POA: Insufficient documentation

## 2021-09-25 DIAGNOSIS — J45909 Unspecified asthma, uncomplicated: Secondary | ICD-10-CM | POA: Insufficient documentation

## 2021-09-25 DIAGNOSIS — F17210 Nicotine dependence, cigarettes, uncomplicated: Secondary | ICD-10-CM | POA: Insufficient documentation

## 2021-09-25 DIAGNOSIS — I2510 Atherosclerotic heart disease of native coronary artery without angina pectoris: Secondary | ICD-10-CM | POA: Insufficient documentation

## 2021-09-25 DIAGNOSIS — R079 Chest pain, unspecified: Secondary | ICD-10-CM

## 2021-09-25 LAB — CBC
HCT: 42.6 % (ref 39.0–52.0)
Hemoglobin: 14.8 g/dL (ref 13.0–17.0)
MCH: 34.3 pg — ABNORMAL HIGH (ref 26.0–34.0)
MCHC: 34.7 g/dL (ref 30.0–36.0)
MCV: 98.8 fL (ref 80.0–100.0)
Platelets: 292 10*3/uL (ref 150–400)
RBC: 4.31 MIL/uL (ref 4.22–5.81)
RDW: 14.6 % (ref 11.5–15.5)
WBC: 16.2 10*3/uL — ABNORMAL HIGH (ref 4.0–10.5)
nRBC: 0 % (ref 0.0–0.2)

## 2021-09-25 NOTE — ED Triage Notes (Signed)
Pt with c/o chest pain that started last night around 8pm. Pt stated he took a NTG tablet and then went to work this morning. He started having chest pain again at work and took another NTG and it "went away a little" but has since stayed constant.

## 2021-09-25 NOTE — ED Provider Notes (Signed)
[facility] EMERGENCY DEPARTMENT Provider Note   CSN: [id number] Arrival date & time: 09/25/21  2236     History Chief Complaint  Patient presents with   Chest Pain    Nathan Waters is a 40 y.o. male.  Patient presents to the emergency department for evaluation of chest pain.  Patient reports that he had onset of chest pain last night while watching TV.  He took a nitroglycerin and went and laid down.  He thinks the pain went away but it was present again when he woke up this morning.  Pain has been present throughout the day.  It has not changed much with exertion.  He did take another nitroglycerin while at work earlier but it did not really change the pain much.   HPI: A 40 year old patient with a history of hypertension, hypercholesterolemia and obesity presents for evaluation of chest pain. Initial onset of pain was more than 6 hours ago. The patient's chest pain is described as heaviness/pressure/tightness and is not worse with exertion. The patient's chest pain is middle- or left-sided, is not well-localized, is not sharp and does not radiate to the arms/jaw/neck. The patient does not complain of nausea and denies diaphoresis. The patient has no history of stroke, has no history of peripheral artery disease, has not smoked in the past 90 days, denies any history of treated diabetes and has no relevant family history of coronary artery disease (first degree relative at less than age 60).   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   Hodgkin lymphoma ([place]) 09/18/2019    Calculus of gallbladder without cholecystitis without obstruction    Generalized lymphadenopathy    Enlarged lymph nodes 06/27/2019   Cholecystitis 06/15/2019   Tobacco use 04/09/2012   Coronary atherosclerosis of native coronary artery 01/06/2012   Mixed hyperlipidemia 01/06/2012   Asthma 11/21/2011    Past Surgical History:  Procedure Laterality Date   AXILLARY LYMPH NODE BIOPSY Left 07/26/2019   Procedure: AXILLARY LYMPH NODE BIOPSY;  Surgeon: Aviva Signs, MD;  Location: [facility];  Service: General;  Laterality: Left;   BACK SURGERY     CARDIAC CATHETERIZATION     CHOLECYSTECTOMY N/A 08/26/2019   Procedure: LAPAROSCOPIC CHOLECYSTECTOMY;  Surgeon: Aviva Signs, MD;  Location: [facility];  Service: General;  Laterality: N/A;   INGUINAL LYMPH NODE BIOPSY Right 05/26/2021   Procedure: INGUINAL LYMPH NODE BIOPSY;  Surgeon: Aviva Signs, MD;  Location: [facility];  Service: General;  Laterality: Right;   LEFT HEART CATHETERIZATION WITH CORONARY ANGIOGRAM N/A 11/21/2011   Procedure: LEFT HEART CATHETERIZATION WITH CORONARY ANGIOGRAM;  Surgeon: Peter M Martinique, MD;  Location: [facility] CATH LAB;  Service: Cardiovascular;  Laterality: N/A;   LYMPH NODE BIOPSY Right 08/26/2019   Procedure: LYMPH NODE BIOPSY, INGUINAL;  Surgeon: Aviva Signs, MD;  Location: [facility];  Service: General;  Laterality: Right;   PERCUTANEOUS CORONARY STENT INTERVENTION (PCI-S) N/A 11/21/2011   Procedure: PERCUTANEOUS CORONARY STENT INTERVENTION (PCI-S);  Surgeon: Peter M Martinique, MD;  Location: [facility] CATH LAB;  Service: Cardiovascular;  Laterality: N/A;   PORTACATH PLACEMENT Left 10/01/2019   Procedure: INSERTION PORT-A-CATH (catheter attached left subclavian);  Surgeon: Aviva Signs, MD;  Location: [facility];  Service: General;  Laterality: Left;   SPLENECTOMY         Family History  Problem Relation Age of Onset   Malignant hyperthermia Mother    Cancer Mother    Malignant hyperthermia Brother    Heart disease Brother    Asthma  Brother    COPD Brother    Heart disease Father    Cancer Father    ADD / ADHD Daughter    ADD / ADHD Son     Social History   Tobacco Use   Smoking status: Every Day    Packs/day: 1.00    Types: Cigarettes   Smokeless tobacco: Never  Vaping Use   Vaping Use: Former   Start date: 04/27/2012  Substance Use Topics   Alcohol use: Yes    Comment: Occasionally   Drug use: No    Home Medications Prior to Admission medications   Medication Sig Start Date End Date Taking? Authorizing Provider  albuterol (PROVENTIL HFA;VENTOLIN HFA) 108 (90 Base) MCG/ACT inhaler Inhale 2 puffs into the lungs every 6 (six) hours as needed for wheezing or shortness of breath.     [provider]  cetirizine (ZYRTEC) 10 MG tablet Take 10 mg by mouth daily as needed for allergies.     [provider]  sulfamethoxazole-trimethoprim (BACTRIM DS) 800-160 MG tablet Take 1 tablet by mouth 2 (two) times daily. 06/24/21   Aviva Signs, MD    Allergies    Penicillins  Review of Systems   Review of Systems  Cardiovascular:  Positive for chest pain.  All other systems reviewed and are negative.  Physical Exam Updated Vital Signs BP (!) 132/91   Pulse 70   Temp 97.9 F (36.6 C) (Oral)   Resp (!) 23   Ht 5\' 11"  (1.803 m)   Wt 127 kg   SpO2 93%   BMI 39.05 kg/m   Physical Exam Vitals and nursing note reviewed.  Constitutional:      General: He is not in acute distress.    Appearance: Normal appearance. He is well-developed.  HENT:     Head: Normocephalic and atraumatic.     Right Ear: Hearing normal.     Left Ear: Hearing normal.     Nose: Nose normal.  Eyes:     Conjunctiva/sclera: Conjunctivae normal.     Pupils: Pupils are equal, round, and reactive to light.  Cardiovascular:     Rate and Rhythm: Regular rhythm.     Heart sounds: S1 normal and S2 normal. No murmur heard.   No friction rub. No gallop.  Pulmonary:     Effort: Pulmonary effort is normal. No respiratory  distress.     Breath sounds: Normal breath sounds.  Chest:     Chest wall: No tenderness.  Abdominal:     General: Bowel sounds are normal.     Palpations: Abdomen is soft.     Tenderness: There is no abdominal tenderness. There is no guarding or rebound. Negative signs include Murphy's sign and McBurney's sign.     Hernia: No hernia is present.  Musculoskeletal:        General: Normal range of motion.     Cervical back: Normal range of motion and neck supple.  Skin:    General: Skin is warm and dry.     Findings: No  rash.  Neurological:     Mental Status: He is alert and oriented to person, place, and time.     GCS: GCS eye subscore is 4. GCS verbal subscore is 5. GCS motor subscore is 6.     Cranial Nerves: No cranial nerve deficit.     Sensory: No sensory deficit.     Coordination: Coordination normal.  Psychiatric:        Speech: Speech normal.        Behavior: Behavior normal.        Thought Content: Thought content normal.    ED Results / Procedures / Treatments   Labs (all labs ordered are listed, but only abnormal results are displayed) Labs Reviewed  BASIC METABOLIC PANEL - Abnormal; Notable for the following components:      Result Value   Potassium 3.3 (*)    Glucose, Bld 119 (*)    All other components within normal limits  CBC - Abnormal; Notable for the following components:   WBC 16.2 (*)    MCH 34.3 (*)    All other components within normal limits  TROPONIN I (HIGH SENSITIVITY)  TROPONIN I (HIGH SENSITIVITY)    EKG EKG Interpretation  Date/Time:  Saturday September 25 2021 22:47:56 EST Ventricular Rate:  99 PR Interval:  154 QRS Duration: 90 QT Interval:  384 QTC Calculation: 492 R Axis:   68 Text Interpretation: Sinus rhythm with frequent Premature ventricular complexes and Fusion complexes Prolonged QT Abnormal ECG No significant change since last tracing Confirmed by Orpah Greek [phone]) on 09/26/2021 12:32:57 AM  Radiology DG Chest  2 View  Result Date: 09/25/2021 CLINICAL DATA:  Chest pain EXAM: CHEST - 2 VIEW COMPARISON:  10/01/2019 FINDINGS: The heart size and mediastinal contours are within normal limits. Both lungs are clear. The visualized skeletal structures are unremarkable. IMPRESSION: No active cardiopulmonary disease. Electronically Signed   By: Rolm Baptise M.D.   On: 09/25/2021 23:15    Procedures Procedures   Medications Ordered in ED Medications - No data to display  ED Course  I have reviewed the triage vital signs and the nursing notes.  Pertinent labs & imaging results that were available during my care of the patient were reviewed by me and considered in my medical decision making (see chart for details).    MDM Rules/Calculators/A&P HEAR Score: 3                         Patient with history of coronary artery disease, status post MI years ago with single-vessel stenting presents to the emergency department for evaluation of chest pain.  He has been having chest pain at rest.  His first episode of pain was 1 day ago.  He thinks it went away after he took a nitroglycerin.  It was present, however, when he woke up this morning.  Pain has been present all day and has not changed with exertion.  No associated shortness of breath.  No tachycardia, tachypnea or signs of PE.  Patient's hear score is 3.  EKG does not show ischemia.  He has had troponin, negative x2.  He definitely needs cardiology follow-up as he has not had follow-up in the office for some time.  At this point, however, I do not see any indication for hospitalization.  He was given return precautions.  Final Clinical Impression(s) / ED Diagnoses Final diagnoses:  Chest pain, unspecified type    Rx / DC Orders ED  Discharge Orders     None        Orpah Greek, MD 09/26/21 [phone]

## 2021-09-25 NOTE — ED Notes (Signed)
Pt in xr

## 2021-09-26 LAB — BASIC METABOLIC PANEL
Anion gap: 6 (ref 5–15)
BUN: 11 mg/dL (ref 6–20)
CO2: 23 mmol/L (ref 22–32)
Calcium: 8.9 mg/dL (ref 8.9–10.3)
Chloride: 109 mmol/L (ref 98–111)
Creatinine, Ser: 0.67 mg/dL (ref 0.61–1.24)
GFR, Estimated: >60 mL/min (ref >60)
Glucose, Bld: 119 mg/dL — ABNORMAL HIGH (ref 70–99)
Potassium: 3.3 mmol/L — ABNORMAL LOW (ref 3.5–5.1)
Sodium: 138 mmol/L (ref 135–145)

## 2021-09-26 LAB — TROPONIN I (HIGH SENSITIVITY)
Troponin I (High Sensitivity): 5 ng/L (ref <18)
Troponin I (High Sensitivity): 5 ng/L (ref <18)

## 2021-09-26 NOTE — ED Notes (Signed)
Gave pt drink and snack

## 2021-10-12 ENCOUNTER — Inpatient Hospital Stay (HOSPITAL_COMMUNITY): Payer: Medicaid Other | Attending: Hematology

## 2021-10-12 ENCOUNTER — Other Ambulatory Visit: Payer: Self-pay

## 2021-10-12 DIAGNOSIS — Z8572 Personal history of non-Hodgkin lymphomas: Secondary | ICD-10-CM | POA: Insufficient documentation

## 2021-10-12 DIAGNOSIS — Z452 Encounter for adjustment and management of vascular access device: Secondary | ICD-10-CM | POA: Insufficient documentation

## 2021-10-12 MED ORDER — HEPARIN SOD (PORK) LOCK FLUSH 100 UNIT/ML IV SOLN
500.0000 [IU] | Freq: Once | INTRAVENOUS | Status: AC
  Administered 2021-10-12: 16:00:00 500 [IU] via INTRAVENOUS

## 2021-10-12 MED ORDER — SODIUM CHLORIDE 0.9% FLUSH
10.0000 mL | INTRAVENOUS | Status: DC | PRN
  Administered 2021-10-12: 16:00:00 10 mL via INTRAVENOUS

## 2021-10-12 NOTE — Progress Notes (Signed)
Trinna Balloon Ardoin presented for Portacath access and flush.  Portacath located left chest wall accessed with  H 20 needle.  No blood return noted. Portacath flushed with 48ml NS and 500U/45ml Heparin and needle removed intact.  Procedure tolerated well and without incident.  Port flush today per MD orders. Tolerated infusion without adverse affects. Vital signs stable. No complaints at this time. Discharged from clinic ambulatory in stable condition. Alert and oriented x 3. F/U with [facility] as scheduled.

## 2021-10-12 NOTE — Patient Instructions (Signed)
[place]  Discharge Instructions: Thank you for choosing [facility] to provide your oncology and hematology care.  If you have a lab appointment with the [place], please come in thru the Main Entrance and check in at the main information desk.  Wear comfortable clothing and clothing appropriate for easy access to any Portacath or PICC line.   We strive to give you quality time with your provider. You may need to reschedule your appointment if you arrive late (15 or more minutes).  Arriving late affects you and other patients whose appointments are after yours.  Also, if you miss three or more appointments without notifying the office, you may be dismissed from the clinic at the providers discretion.      For prescription refill requests, have your pharmacy contact our office and allow 72 hours for refills to be completed.    Today you received the following chemotherapy and/or immunotherapy agents port flush with labs.   BELOW ARE SYMPTOMS THAT SHOULD BE REPORTED IMMEDIATELY: *FEVER GREATER THAN 100.4 F (38 C) OR HIGHER *CHILLS OR SWEATING *NAUSEA AND VOMITING THAT IS NOT CONTROLLED WITH YOUR NAUSEA MEDICATION *UNUSUAL SHORTNESS OF BREATH *UNUSUAL BRUISING OR BLEEDING *URINARY PROBLEMS (pain or burning when urinating, or frequent urination) *BOWEL PROBLEMS (unusual diarrhea, constipation, pain near the anus) TENDERNESS IN MOUTH AND THROAT WITH OR WITHOUT PRESENCE OF ULCERS (sore throat, sores in mouth, or a toothache) UNUSUAL RASH, SWELLING OR PAIN  UNUSUAL VAGINAL DISCHARGE OR ITCHING   Items with * indicate a potential emergency and should be followed up as soon as possible or go to the Emergency Department if any problems should occur.  Please show the CHEMOTHERAPY ALERT CARD or IMMUNOTHERAPY ALERT CARD at check-in to the Emergency Department and triage nurse.  Should you have questions after your visit or need to cancel or reschedule your  appointment, please contact [facility] [phone]  and follow the prompts.  Office hours are 8:00 a.m. to 4:30 p.m. Monday - Friday. Please note that voicemails left after 4:00 p.m. may not be returned until the following business day.  We are closed weekends and major holidays. You have access to a nurse at all times for urgent questions. Please call the main number to the clinic [phone] and follow the prompts.  For any non-urgent questions, you may also contact your provider using MyChart. We now offer e-Visits for anyone 32 and older to request care online for non-urgent symptoms. For details visit [email or web address].   Also download the MyChart app! Go to the app store, search "MyChart", open the app, select , and log in with your MyChart username and password.  Due to Covid, a mask is required upon entering the hospital/clinic. If you do not have a mask, one will be given to you upon arrival. For doctor visits, patients may have 1 support person aged 38 or older with them. For treatment visits, patients cannot have anyone with them due to current Covid guidelines and our immunocompromised population.

## 2021-12-02 ENCOUNTER — Inpatient Hospital Stay (HOSPITAL_COMMUNITY): Payer: Medicaid Other | Attending: Hematology

## 2021-12-02 ENCOUNTER — Other Ambulatory Visit: Payer: Self-pay

## 2021-12-02 ENCOUNTER — Encounter (HOSPITAL_COMMUNITY)
Admission: RE | Admit: 2021-12-02 | Discharge: 2021-12-02 | Disposition: A | Discharge status: Home / Self Care | Payer: Medicaid Other | Source: Ambulatory Visit | Attending: Hematology | Admitting: Hematology

## 2021-12-02 ENCOUNTER — Encounter (HOSPITAL_COMMUNITY): Payer: Self-pay

## 2021-12-02 VITALS — BP 133/88 | HR 97 | Temp 97.3°F | Resp 18

## 2021-12-02 DIAGNOSIS — G629 Polyneuropathy, unspecified: Secondary | ICD-10-CM | POA: Insufficient documentation

## 2021-12-02 DIAGNOSIS — Z8571 Personal history of Hodgkin lymphoma: Secondary | ICD-10-CM | POA: Insufficient documentation

## 2021-12-02 DIAGNOSIS — Z452 Encounter for adjustment and management of vascular access device: Secondary | ICD-10-CM | POA: Insufficient documentation

## 2021-12-02 DIAGNOSIS — K769 Liver disease, unspecified: Secondary | ICD-10-CM | POA: Diagnosis not present

## 2021-12-02 DIAGNOSIS — C8190 Hodgkin lymphoma, unspecified, unspecified site: Secondary | ICD-10-CM | POA: Insufficient documentation

## 2021-12-02 DIAGNOSIS — R590 Localized enlarged lymph nodes: Secondary | ICD-10-CM | POA: Diagnosis not present

## 2021-12-02 DIAGNOSIS — Z95828 Presence of other vascular implants and grafts: Secondary | ICD-10-CM

## 2021-12-02 DIAGNOSIS — J353 Hypertrophy of tonsils with hypertrophy of adenoids: Secondary | ICD-10-CM | POA: Diagnosis not present

## 2021-12-02 LAB — CBC WITH DIFFERENTIAL/PLATELET
Abs Immature Granulocytes: 0.06 10*3/uL (ref 0.00–0.07)
Basophils Absolute: 0.1 10*3/uL (ref 0.0–0.1)
Basophils Relative: 1 %
Eosinophils Absolute: 0.4 10*3/uL (ref 0.0–0.5)
Eosinophils Relative: 3 %
HCT: 45.7 % (ref 39.0–52.0)
Hemoglobin: 15.3 g/dL (ref 13.0–17.0)
Immature Granulocytes: 1 %
Lymphocytes Relative: 36 %
Lymphs Abs: 4.4 10*3/uL — ABNORMAL HIGH (ref 0.7–4.0)
MCH: 32.8 pg (ref 26.0–34.0)
MCHC: 33.5 g/dL (ref 30.0–36.0)
MCV: 98.1 fL (ref 80.0–100.0)
Monocytes Absolute: 0.9 10*3/uL (ref 0.1–1.0)
Monocytes Relative: 7 %
Neutro Abs: 6.6 10*3/uL (ref 1.7–7.7)
Neutrophils Relative %: 52 %
Platelets: 289 10*3/uL (ref 150–400)
RBC: 4.66 MIL/uL (ref 4.22–5.81)
RDW: 14.2 % (ref 11.5–15.5)
WBC: 12.4 10*3/uL — ABNORMAL HIGH (ref 4.0–10.5)
nRBC: 0 % (ref 0.0–0.2)

## 2021-12-02 LAB — COMPREHENSIVE METABOLIC PANEL
ALT: 30 U/L (ref 0–44)
AST: 23 U/L (ref 15–41)
Albumin: 4 g/dL (ref 3.5–5.0)
Alkaline Phosphatase: 102 U/L (ref 38–126)
Anion gap: 10 (ref 5–15)
BUN: 11 mg/dL (ref 6–20)
CO2: 20 mmol/L — ABNORMAL LOW (ref 22–32)
Calcium: 9.2 mg/dL (ref 8.9–10.3)
Chloride: 108 mmol/L (ref 98–111)
Creatinine, Ser: 0.74 mg/dL (ref 0.61–1.24)
GFR, Estimated: >60 mL/min (ref >60)
Glucose, Bld: 116 mg/dL — ABNORMAL HIGH (ref 70–99)
Potassium: 3.9 mmol/L (ref 3.5–5.1)
Sodium: 138 mmol/L (ref 135–145)
Total Bilirubin: 0.5 mg/dL (ref 0.3–1.2)
Total Protein: 6.7 g/dL (ref 6.5–8.1)

## 2021-12-02 LAB — LACTATE DEHYDROGENASE: LDH: 120 U/L (ref 98–192)

## 2021-12-02 LAB — SEDIMENTATION RATE: Sed Rate: 6 mm/hr (ref 0–16)

## 2021-12-02 MED ORDER — FLUDEOXYGLUCOSE F - 18 (FDG) INJECTION
13.9700 | Freq: Once | INTRAVENOUS | Status: AC | PRN
  Administered 2021-12-02: 13.97 via INTRAVENOUS

## 2021-12-02 MED ORDER — HEPARIN SOD (PORK) LOCK FLUSH 100 UNIT/ML IV SOLN
500.0000 [IU] | Freq: Once | INTRAVENOUS | Status: AC
  Administered 2021-12-02: 500 [IU] via INTRAVENOUS

## 2021-12-02 MED ORDER — SODIUM CHLORIDE 0.9% FLUSH
10.0000 mL | Freq: Once | INTRAVENOUS | Status: AC
  Administered 2021-12-02: 10 mL via INTRAVENOUS

## 2021-12-02 NOTE — Progress Notes (Signed)
Nathan Waters presented for Portacath access/flush with labs.   Portacath located left  chest wall accessed with  H 20 needle.  No blood return noted. Portacath flushed with 71ml NS and 500U/59ml Heparin and needle removed intact.  Procedure tolerated well and without incident. Flushed with no resistance. Labs drawn peripheral. Scheduling notified to have patient do lab work downstairs moving forward.

## 2021-12-09 ENCOUNTER — Encounter (HOSPITAL_COMMUNITY): Payer: Self-pay | Admitting: Hematology

## 2021-12-09 ENCOUNTER — Other Ambulatory Visit: Payer: Self-pay

## 2021-12-09 ENCOUNTER — Inpatient Hospital Stay (HOSPITAL_BASED_OUTPATIENT_CLINIC_OR_DEPARTMENT_OTHER): Payer: Medicaid Other | Admitting: Hematology

## 2021-12-09 VITALS — BP 116/77 | HR 88 | Temp 98.3°F | Resp 16 | Ht 71.46 in | Wt 274.5 lb

## 2021-12-09 DIAGNOSIS — C8190 Hodgkin lymphoma, unspecified, unspecified site: Secondary | ICD-10-CM | POA: Diagnosis not present

## 2021-12-09 DIAGNOSIS — Z8571 Personal history of Hodgkin lymphoma: Secondary | ICD-10-CM | POA: Diagnosis not present

## 2021-12-09 DIAGNOSIS — Z452 Encounter for adjustment and management of vascular access device: Secondary | ICD-10-CM | POA: Diagnosis not present

## 2021-12-09 DIAGNOSIS — G629 Polyneuropathy, unspecified: Secondary | ICD-10-CM | POA: Diagnosis not present

## 2021-12-09 NOTE — Patient Instructions (Signed)
[place] at [facility] Discharge Instructions  You were seen and examined today by Dr. Delton Coombes. He reviewed your most recent labs and scans and the only thing that is showing is a boil under your left arm. Please keep follow up appointment as scheduled in 6 months.   Thank you for choosing [place] at [facility] to provide your oncology and hematology care.  To afford each patient quality time with our provider, please arrive at least 15 minutes before your scheduled appointment time.   If you have a lab appointment with the [facility] please come in thru the Main Entrance and check in at the main information desk.  You need to re-schedule your appointment should you arrive 10 or more minutes late.  We strive to give you quality time with our providers, and arriving late affects you and other patients whose appointments are after yours.  Also, if you no show three or more times for appointments you may be dismissed from the clinic at the providers discretion.     Again, thank you for choosing [facility].  Our hope is that these requests will decrease the amount of time that you wait before being seen by our physicians.       _____________________________________________________________  Should you have questions after your visit to [facility], please contact our office at [phone] and follow the prompts.  Our office hours are 8:00 a.m. and 4:30 p.m. Monday - Friday.  Please note that voicemails left after 4:00 p.m. may not be returned until the following business day.  We are closed weekends and major holidays.  You do have access to a nurse 24-7, just call the main number to the clinic [phone] and do not press any options, hold on the line and a nurse will answer the phone.    For prescription refill requests, have your pharmacy contact our office and allow 72 hours.    Due to Covid, you will need to  wear a mask upon entering the hospital. If you do not have a mask, a mask will be given to you at the Main Entrance upon arrival. For doctor visits, patients may have 1 support person age 62 or older with them. For treatment visits, patients can not have anyone with them due to social distancing guidelines and our immunocompromised population.

## 2021-12-09 NOTE — Progress Notes (Signed)
Nathan Waters, Nathan Waters [id number]   CLINIC:  Medical Oncology/Hematology  PCP:  No primary care provider on file. No primary physician on file. 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 ([place])   09/18/2019 Cancer Staging   Staging form: Hodgkin and Non-Hodgkin Lymphoma, AJCC 8th Edition - Clinical stage from 09/18/2019: Stage III (Hodgkin lymphoma) - Signed by Derek Jack, MD on 09/18/2019    10/22/2019 -  Chemotherapy   The patient had DOXOrubicin (ADRIAMYCIN) chemo injection 124 mg, 50 mg/m2 = 124 mg, Intravenous,  Once, 6 of 6 cycles Administration: 124 mg (11/12/2019), 124 mg (10/23/2019), 124 mg (12/03/2019), 124 mg (12/24/2019), 124 mg (01/14/2020), 124 mg (02/04/2020) palonosetron (ALOXI) injection 0.25 mg, 0.25 mg, Intravenous,  Once, 6 of 6 cycles Administration: 0.25 mg (11/12/2019), 0.25 mg (10/23/2019), 0.25 mg (12/03/2019), 0.25 mg (12/24/2019), 0.25 mg (01/14/2020), 0.25 mg (02/04/2020) pegfilgrastim-cbqv (UDENYCA) injection 6 mg, 6 mg, Subcutaneous, Once, 6 of 6 cycles Administration: 6 mg (10/24/2019), 6 mg (11/14/2019), 6 mg (12/04/2019), 6 mg (12/26/2019), 6 mg (01/16/2020) vinCRIStine (ONCOVIN) 2 mg in sodium chloride 0.9 % 50 mL chemo infusion, 2 mg, Intravenous,  Once, 6 of 6 cycles Dose modification: 1 mg (50 % of original dose 2 mg, Cycle 3, Reason: Other (see comments), Comment: neuropathy) Administration: 2 mg (11/12/2019), 2 mg (10/23/2019), 1 mg (12/03/2019), 1 mg (12/24/2019), 1 mg (01/14/2020), 1 mg (02/04/2020) cyclophosphamide (CYTOXAN) 1,840 mg in sodium chloride 0.9 % 250 mL chemo infusion, 750 mg/m2 = 1,840 mg, Intravenous,  Once, 6 of 6  cycles Administration: 1,840 mg (11/12/2019), 1,840 mg (10/23/2019), 1,840 mg (12/03/2019), 1,840 mg (12/24/2019), 1,840 mg (01/14/2020), 1,840 mg (02/04/2020) fosaprepitant (EMEND) 150 mg in sodium chloride 0.9 % 145 mL IVPB, 150 mg, Intravenous,  Once, 6 of 6 cycles Administration: 150 mg (11/12/2019), 150 mg (10/23/2019), 150 mg (12/03/2019), 150 mg (12/24/2019), 150 mg (01/14/2020), 150 mg (02/04/2020) riTUXimab-pvvr (RUXIENCE) 100 mg in sodium chloride 0.9 % 90 mL (1 mg/mL) infusion, 100 mg (100 % of original dose 100 mg), Intravenous,  Once, 6 of 6 cycles Dose modification: 800 mg (original dose 375 mg/m2, Cycle 1, Reason: Provider Judgment, Comment: test dose of 100 mg given then remainder 800 mg = 900 mg), 100 mg (original dose 100 mg, Cycle 1) Administration: 800 mg (10/22/2019), 900 mg (11/12/2019), 100 mg (10/22/2019), 900 mg (12/03/2019), 900 mg (12/24/2019), 900 mg (01/14/2020), 900 mg (02/04/2020)   for chemotherapy treatment.       CANCER STAGING: Cancer Staging  Hodgkin lymphoma [facility]) Staging form: Hodgkin and Non-Hodgkin Lymphoma, AJCC 8th Edition - Clinical stage from 09/18/2019: Stage III (Hodgkin lymphoma) - Signed by Derek Jack, MD on 09/18/2019   INTERVAL HISTORY:  Nathan Waters, a 41 y.o. male, returns for routine follow-up of his Hodgkin's lymphoma. Nathan Waters was last seen on 06/04/2021.   Today he reports feeling good. He reports his port site is occasionally irritated when wearing a seat belt. He reports a boil under his left arm. He denies fevers, night sweats, infections, and weight loss.   REVIEW OF SYSTEMS:  Review of Systems  Constitutional:  Negative for appetite change, fatigue, fever and unexpected weight change.  Endocrine: Negative  for hot flashes.  All other systems reviewed and are negative.  PAST MEDICAL/SURGICAL HISTORY:  Past Medical History:  Diagnosis Date   Asthma    Coronary atherosclerosis of native coronary artery    BMS to mid circumflex 11/2011,  LVEF 55-60%   Essential hypertension    Migraine    Mixed hyperlipidemia    STEMI (ST elevation myocardial infarction) ([place])    Complicated by VF arrest 11/2011   Past Surgical History:  Procedure Laterality Date   AXILLARY LYMPH NODE BIOPSY Left 07/26/2019   Procedure: AXILLARY LYMPH NODE BIOPSY;  Surgeon: Aviva Signs, MD;  Location: [facility];  Service: General;  Laterality: Left;   BACK SURGERY     CARDIAC CATHETERIZATION     CHOLECYSTECTOMY N/A 08/26/2019   Procedure: LAPAROSCOPIC CHOLECYSTECTOMY;  Surgeon: Aviva Signs, MD;  Location: [facility];  Service: General;  Laterality: N/A;   INGUINAL LYMPH NODE BIOPSY Right 05/26/2021   Procedure: INGUINAL LYMPH NODE BIOPSY;  Surgeon: Aviva Signs, MD;  Location: [facility];  Service: General;  Laterality: Right;   LEFT HEART CATHETERIZATION WITH CORONARY ANGIOGRAM N/A 11/21/2011   Procedure: LEFT HEART CATHETERIZATION WITH CORONARY ANGIOGRAM;  Surgeon: Peter M Martinique, MD;  Location: [facility] CATH LAB;  Service: Cardiovascular;  Laterality: N/A;   LYMPH NODE BIOPSY Right 08/26/2019   Procedure: LYMPH NODE BIOPSY, INGUINAL;  Surgeon: Aviva Signs, MD;  Location: [facility];  Service: General;  Laterality: Right;   PERCUTANEOUS CORONARY STENT INTERVENTION (PCI-S) N/A 11/21/2011   Procedure: PERCUTANEOUS CORONARY STENT INTERVENTION (PCI-S);  Surgeon: Peter M Martinique, MD;  Location: [facility] CATH LAB;  Service: Cardiovascular;  Laterality: N/A;   PORTACATH PLACEMENT Left 10/01/2019   Procedure: INSERTION PORT-A-CATH (catheter attached left subclavian);  Surgeon: Aviva Signs, MD;  Location: [facility];  Service: General;  Laterality: Left;   SPLENECTOMY      SOCIAL HISTORY:  Social History   Socioeconomic History   Marital status: Divorced    Spouse name: Not on file   Number of children: 2   Years of education: Not on file   Highest education level: Not on file  Occupational History   Occupation: not employed  Tobacco Use   Smoking status: Every Day    Packs/day:  1.00    Types: Cigarettes   Smokeless tobacco: Never  Vaping Use   Vaping Use: Former   Start date: 04/27/2012  Substance and Sexual Activity   Alcohol use: Yes    Comment: Occasionally   Drug use: No   Sexual activity: Yes    Birth control/protection: Rhythm  Other Topics Concern   Not on file  Social History Narrative   Not on file   Social Determinants of Health   Financial Resource Strain: Not on file  Food Insecurity: Not on file  Transportation Needs: Not on file  Physical Activity: Not on file  Stress: Not on file  Social Connections: Not on file  Intimate Partner Violence: Not on file    FAMILY HISTORY:  Family History  Problem Relation Age of Onset   Malignant hyperthermia Mother    Cancer Mother    Malignant hyperthermia Brother    Heart disease Brother    Asthma Brother    COPD Brother    Heart disease Father    Cancer Father    ADD / ADHD Daughter    ADD / ADHD Son     CURRENT MEDICATIONS:  Current Outpatient Medications  Medication Sig Dispense Refill   albuterol (PROVENTIL HFA;VENTOLIN HFA) 108 (  90 Base) MCG/ACT inhaler Inhale 2 puffs into the lungs every 6 (six) hours as needed for wheezing or shortness of breath.      cetirizine (ZYRTEC) 10 MG tablet Take 10 mg by mouth daily as needed for allergies.      sulfamethoxazole-trimethoprim (BACTRIM DS) 800-160 MG tablet Take 1 tablet by mouth 2 (two) times daily. 14 tablet 0   No current facility-administered medications for this visit.    ALLERGIES:  Allergies  Allergen Reactions   Penicillins Anaphylaxis    Did it involve swelling of the face/tongue/throat, SOB, or low BP? Yes Did it involve sudden or severe rash/hives, skin peeling, or any reaction on the inside of your mouth or nose? No Did you need to seek medical attention at a hospital or doctor's office? Yes When did it last happen?  Childhood allergy     If all above answers are NO, may proceed with cephalosporin use.     PHYSICAL  EXAM:  Performance status (ECOG): 0 - Asymptomatic  There were no vitals filed for this visit. Wt Readings from Last 3 Encounters:  09/25/21 280 lb (127 kg)  06/24/21 276 lb (125.2 kg)  06/10/21 272 lb (123.4 kg)   Physical Exam Vitals reviewed.  Constitutional:      Appearance: Normal appearance. He is obese.  Cardiovascular:     Rate and Rhythm: Normal rate and regular rhythm.     Pulses: Normal pulses.     Heart sounds: Normal heart sounds.  Pulmonary:     Effort: Pulmonary effort is normal.     Breath sounds: Normal breath sounds.  Lymphadenopathy:     Upper Body:     Right upper body: No supraclavicular or axillary adenopathy.     Left upper body: No supraclavicular or axillary adenopathy.  Neurological:     General: No focal deficit present.     Mental Status: He is alert and oriented to person, place, and time.  Psychiatric:        Mood and Affect: Mood normal.        Behavior: Behavior normal.     LABORATORY DATA:  I have reviewed the labs as listed.  CBC Latest Ref Rng & Units 12/02/2021 09/25/2021 04/06/2021  WBC 4.0 - 10.5 K/uL 12.4(H) 16.2(H) 11.4(H)  Hemoglobin 13.0 - 17.0 g/dL 15.3 14.8 15.5  Hematocrit 39.0 - 52.0 % 45.7 42.6 45.7  Platelets 150 - 400 K/uL 289 292 311   CMP Latest Ref Rng & Units 12/02/2021 09/25/2021 04/06/2021  Glucose 70 - 99 mg/dL 116(H) 119(H) 116(H)  BUN 6 - 20 mg/dL 11 11 8   Creatinine 0.61 - 1.24 mg/dL 0.74 0.67 0.74  Sodium 135 - 145 mmol/L 138 138 138  Potassium 3.5 - 5.1 mmol/L 3.9 3.3(L) 3.8  Chloride 98 - 111 mmol/L 108 109 107  CO2 22 - 32 mmol/L 20(L) 23 26  Calcium 8.9 - 10.3 mg/dL 9.2 8.9 9.0  Total Protein 6.5 - 8.1 g/dL 6.7 - 6.8  Total Bilirubin 0.3 - 1.2 mg/dL 0.5 - 0.8  Alkaline Phos 38 - 126 U/L 102 - 98  AST 15 - 41 U/L 23 - 30  ALT 0 - 44 U/L 30 - 40    DIAGNOSTIC IMAGING:  I have independently reviewed the scans and discussed with the patient. NM PET Image Restag (PS) Skull Base To Thigh  Result Date:  12/03/2021 CLINICAL DATA:  Subsequent treatment strategy for Hodgkin's lymphoma. EXAM: NUCLEAR MEDICINE PET SKULL BASE TO THIGH TECHNIQUE: 13.97  mCi F-18 FDG was injected intravenously. Full-ring PET imaging was performed from the skull base to thigh after the radiotracer. CT data was obtained and used for attenuation correction and anatomic localization. Fasting blood glucose: 114 mg/dl COMPARISON:  Multiple priors including most recent PET-CT April 23, 2021 FINDINGS: Mediastinal blood pool activity: SUV max 2.98 Liver activity: SUV max 4.51 NECK: Prominent hypermetabolic bilateral cervical lymph nodes appear similar prior. Previous index lymph nodes are as follows: -Left level 2 lymph node measures 6 mm in short axis on image 38/3 with a max SUV of 4.2 previously measuring 8 mm in short axis with a max SUV of 4.3. -left level 2 lymph node measures 6 mm in short axis on image 45/3 with a max SUV of 3.8 No new or progressive cervical hypermetabolic adenopathy. Similar symmetric hypermetabolic hyperplasia of the tonsils with asymmetric hypermetabolic hyperplasia of the left adenoids with a max SUV of 7.36. Incidental CT findings: No discrete thyroid nodule. CHEST: Previously indexed left axillary lymph node measures 11 mm in short axis on image 88/3 with a max SUV of 2.4 previously measuring 8 mm in short axis with a max SUV of 2.3. No new areas of hypermetabolic thoracic adenopathy. Small cutaneous hypermetabolic focus in the left axilla with a max SUV of 5.6, likely inflammatory recommend correlation with direct visualization. Incidental CT findings: Left chest Port-A-Cath with tip at the superior cavoatrial junction. Prior left axillary lymph node dissection. ABDOMEN/PELVIS: Right external iliac lymph node measures 11 mm in short axis on image 252/3 with a maximum SUV of 2.12 previously measuring 1.2 cm with a max SUV of 3.1. -more caudal right external iliac lymph node measures 16 mm in short axis on image 261/3  with a max SUV of 1.1 previously 1.6 cm with a max SUV of 2.3. Previously indexed right inguinal lymph node measures 9 mm in short axis with a max SUV of 1.3 previously measuring 1.4 cm with a max SUV of 4.2. Low level hypermetabolic activity associated with the linear band of soft tissue extending from the right inguinal cutaneous skin surface likely reflecting post biopsy/surgical change. Incidental CT findings: Prior splenectomy. SKELETON: Diffuse low-level hypermetabolic marrow activity favored to reflect marrow reconstitution. Incidental CT findings: Lumbar fixation hardware. IMPRESSION: 1. Overall similar size with similar metabolic activity in the cervical and thoracic lymph nodes. Similar size with overall decreased metabolic activity in the pelvic lymph nodes. (Deauville 3). 2. Unchanged symmetric hypermetabolic hyperplasia of the tonsils with asymmetric hypermetabolic hyperplasia of the left adenoids, nonspecific and possibly reactive however disease involvement not excluded. 3. Diffuse low-level hypermetabolic marrow activity is favored to reflect marrow reconstitution. 4. Left axillary cutaneous focus of hypermetabolic activity is nonspecific but likely infectious/inflammatory recommend correlation with direct visualization. Electronically Signed   By: Dahlia Bailiff M.D.   On: 12/03/2021 08:04     ASSESSMENT:  1.  NLP Hodgkin's disease: -6 cycles of R-CHOP from 10/23/2019 through 02/04/2020. -PET scan on 03/25/2020 showed most of the lymph nodes with Deauville 2 activity with normal size limits.  2 of the right external iliac lymph nodes remain mildly enlarged.  Right thigh mass also normalized.   PLAN:  1.  NLP Hodgkin's disease: - He does not have any B symptoms. - We have reviewed PET scan from 12/02/2021 which showed similar size and similar metabolic activity in the cervical and thoracic lymph nodes.  Similar size with overall decreased metabolic activity in the pelvic lymph nodes, Deauville  3.  Unchanged symmetric hypermetabolic  hyperplasia of the tonsils with asymmetric hypermetabolic hyperplasia of the left adenoids, nonspecific.  Left axillary cutaneous focus of hypermetabolic activity from carbuncle. - He has a left chest wall port which is sensitive and he cannot wear his seatbelt. - We will refer him back to Dr. Arnoldo Morale for port removal. - He will come back in 6 months with repeat labs and PET scan.   2.  Peripheral neuropathy: - This has completely resolved at this time.   Orders placed this encounter:  No orders of the defined types were placed in this encounter.    Derek Jack, MD [place] [phone]   I, Thana Ates, am acting as a scribe for Dr. Derek Jack.  I, Derek Jack MD, have reviewed the above documentation for accuracy and completeness, and I agree with the above.

## 2021-12-22 ENCOUNTER — Ambulatory Visit: Payer: Medicaid Other | Admitting: General Surgery

## 2021-12-22 ENCOUNTER — Encounter: Payer: Self-pay | Admitting: General Surgery

## 2021-12-22 ENCOUNTER — Other Ambulatory Visit: Payer: Self-pay

## 2021-12-22 VITALS — BP 138/90 | HR 72 | Temp 98.4°F | Resp 14 | Ht 71.0 in | Wt 278.0 lb

## 2021-12-22 DIAGNOSIS — C8190 Hodgkin lymphoma, unspecified, unspecified site: Secondary | ICD-10-CM | POA: Diagnosis not present

## 2021-12-24 NOTE — H&P (Signed)
Nathan Waters; [id number]; 01-17-1981 ? ? ?HPI ?Patient is a 41 year old white male who was referred to my care by Dr. Delton Coombes for Port-A-Cath removal.  He has finished with his chemotherapy for Hodgkin's lymphoma. ?Past Medical History:  ?Diagnosis Date  ? Asthma   ? Coronary atherosclerosis of native coronary artery   ? BMS to mid circumflex 11/2011, LVEF 55-60%  ? Essential hypertension   ? Migraine   ? Mixed hyperlipidemia   ? STEMI (ST elevation myocardial infarction) ([place])   ? Complicated by VF arrest 11/2011  ? ? ?Past Surgical History:  ?Procedure Laterality Date  ? AXILLARY LYMPH NODE BIOPSY Left 07/26/2019  ? Procedure: AXILLARY LYMPH NODE BIOPSY;  Surgeon: Aviva Signs, MD;  Location: [facility];  Service: General;  Laterality: Left;  ? BACK SURGERY    ? CARDIAC CATHETERIZATION    ? CHOLECYSTECTOMY N/A 08/26/2019  ? Procedure: LAPAROSCOPIC CHOLECYSTECTOMY;  Surgeon: Aviva Signs, MD;  Location: [facility];  Service: General;  Laterality: N/A;  ? INGUINAL LYMPH NODE BIOPSY Right 05/26/2021  ? Procedure: INGUINAL LYMPH NODE BIOPSY;  Surgeon: Aviva Signs, MD;  Location: [facility];  Service: General;  Laterality: Right;  ? LEFT HEART CATHETERIZATION WITH CORONARY ANGIOGRAM N/A 11/21/2011  ? Procedure: LEFT HEART CATHETERIZATION WITH CORONARY ANGIOGRAM;  Surgeon: Peter M Martinique, MD;  Location: [facility] CATH LAB;  Service: Cardiovascular;  Laterality: N/A;  ? LYMPH NODE BIOPSY Right 08/26/2019  ? Procedure: LYMPH NODE BIOPSY, INGUINAL;  Surgeon: Aviva Signs, MD;  Location: [facility];  Service: General;  Laterality: Right;  ? PERCUTANEOUS CORONARY STENT INTERVENTION (PCI-S) N/A 11/21/2011  ? Procedure: PERCUTANEOUS CORONARY STENT INTERVENTION (PCI-S);  Surgeon: Peter M Martinique, MD;  Location: [facility] CATH LAB;  Service: Cardiovascular;  Laterality: N/A;  ? PORTACATH PLACEMENT Left 10/01/2019  ? Procedure: INSERTION PORT-A-CATH (catheter attached left subclavian);  Surgeon: Aviva Signs, MD;  Location: [facility];  Service: General;   Laterality: Left;  ? SPLENECTOMY    ? ? ?Family History  ?Problem Relation Age of Onset  ? Malignant hyperthermia Mother   ? Cancer Mother   ? Malignant hyperthermia Brother   ? Heart disease Brother   ? Asthma Brother   ? COPD Brother   ? Heart disease Father   ? Cancer Father   ? ADD / ADHD Daughter   ? ADD / ADHD Son   ? ? ?Current Outpatient Medications on File Prior to Visit  ?Medication Sig Dispense Refill  ? albuterol (PROVENTIL HFA;VENTOLIN HFA) 108 (90 Base) MCG/ACT inhaler Inhale 2 puffs into the lungs every 6 (six) hours as needed for wheezing or shortness of breath.  (Patient not taking: Reported on 12/09/2021)    ? cetirizine (ZYRTEC) 10 MG tablet Take 10 mg by mouth daily as needed for allergies.  (Patient not taking: Reported on 12/09/2021)    ? ?No current facility-administered medications on file prior to visit.  ? ? ?Allergies  ?Allergen Reactions  ? Penicillins Anaphylaxis  ?  Did it involve swelling of the face/tongue/throat, SOB, or low BP? Yes ?Did it involve sudden or severe rash/hives, skin peeling, or any reaction on the inside of your mouth or nose? No ?Did you need to seek medical attention at a hospital or doctor's office? Yes ?When did it last happen?  Childhood allergy     ?If all above answers are ?NO?, may proceed with cephalosporin use. ?  ? ? ?Social History  ? ?Substance and Sexual Activity  ?Alcohol Use Yes  ? Comment: Occasionally  ? ? ?  Social History  ? ?Tobacco Use  ?Smoking Status Every Day  ? Packs/day: 1.00  ? Types: Cigarettes  ?Smokeless Tobacco Never  ? ? ?Review of Systems  ?Constitutional: Negative.   ?HENT: Negative.    ?Eyes: Negative.   ?Respiratory: Negative.    ?Cardiovascular: Negative.   ?Gastrointestinal: Negative.   ?Genitourinary: Negative.   ?Musculoskeletal: Negative.   ?Skin: Negative.   ?Neurological: Negative.   ?Endo/Heme/Allergies: Negative.   ?Psychiatric/Behavioral: Negative.    ? ?Objective  ? ?Vitals:  ? 12/22/21 1451  ?BP: 138/90  ?Pulse: 72  ?Resp:  14  ?Temp: 98.4 ?F (36.9 ?C)  ?SpO2: 93%  ? ? ?Physical Exam ?Vitals reviewed.  ?Constitutional:   ?   Appearance: Normal appearance. He is not ill-appearing.  ?HENT:  ?   Head: Normocephalic and atraumatic.  ?Cardiovascular:  ?   Rate and Rhythm: Normal rate and regular rhythm.  ?   Heart sounds: Normal heart sounds. No murmur heard. ?  No friction rub. No gallop.  ?Pulmonary:  ?   Effort: Pulmonary effort is normal. No respiratory distress.  ?   Breath sounds: Normal breath sounds. No stridor. No wheezing, rhonchi or rales.  ?   Comments: Port-A-Cath in place left upper chest ?Skin: ?   General: Skin is warm and dry.  ?Neurological:  ?   Mental Status: He is alert and oriented to person, place, and time.  ? ?Oncology notes reviewed ?Assessment  ?Hodgkin's lymphoma, finished with chemotherapy ?Plan  ?Patient is scheduled for Port-A-Cath removal in the minor procedure room on 12/27/2021.  The risks and benefits of the procedure were fully explained to the patient, who gave informed consent. ?

## 2021-12-24 NOTE — Progress Notes (Signed)
Nathan Waters [place]; [id number]; [birth year] ? ? ?HPI ?Patient is a 41 year old white male who was referred to my care by Dr. Delton Coombes for Port-A-Cath removal.  He has finished with his chemotherapy for Hodgkin's lymphoma. ?Past Medical History:  ?Diagnosis Date  ? Asthma   ? Coronary atherosclerosis of native coronary artery   ? BMS to mid circumflex 11/2011, LVEF 55-60%  ? Essential hypertension   ? Migraine   ? Mixed hyperlipidemia   ? STEMI (ST elevation myocardial infarction) ([place])   ? Complicated by VF arrest 11/2011  ? ? ?Past Surgical History:  ?Procedure Laterality Date  ? AXILLARY LYMPH NODE BIOPSY Left 07/26/2019  ? Procedure: AXILLARY LYMPH NODE BIOPSY;  Surgeon: Aviva Signs, MD;  Location: [facility];  Service: General;  Laterality: Left;  ? BACK SURGERY    ? CARDIAC CATHETERIZATION    ? CHOLECYSTECTOMY N/A 08/26/2019  ? Procedure: LAPAROSCOPIC CHOLECYSTECTOMY;  Surgeon: Aviva Signs, MD;  Location: [facility];  Service: General;  Laterality: N/A;  ? INGUINAL LYMPH NODE BIOPSY Right 05/26/2021  ? Procedure: INGUINAL LYMPH NODE BIOPSY;  Surgeon: Aviva Signs, MD;  Location: [facility];  Service: General;  Laterality: Right;  ? LEFT HEART CATHETERIZATION WITH CORONARY ANGIOGRAM N/A 11/21/2011  ? Procedure: LEFT HEART CATHETERIZATION WITH CORONARY ANGIOGRAM;  Surgeon: Peter M Martinique, MD;  Location: [facility] CATH LAB;  Service: Cardiovascular;  Laterality: N/A;  ? LYMPH NODE BIOPSY Right 08/26/2019  ? Procedure: LYMPH NODE BIOPSY, INGUINAL;  Surgeon: Aviva Signs, MD;  Location: [facility];  Service: General;  Laterality: Right;  ? PERCUTANEOUS CORONARY STENT INTERVENTION (PCI-S) N/A 11/21/2011  ? Procedure: PERCUTANEOUS CORONARY STENT INTERVENTION (PCI-S);  Surgeon: Peter M Martinique, MD;  Location: [facility] CATH LAB;  Service: Cardiovascular;  Laterality: N/A;  ? PORTACATH PLACEMENT Left 10/01/2019  ? Procedure: INSERTION PORT-A-CATH (catheter attached left subclavian);  Surgeon: Aviva Signs, MD;  Location: [facility];  Service: General;   Laterality: Left;  ? SPLENECTOMY    ? ? ?Family History  ?Problem Relation Age of Onset  ? Malignant hyperthermia Mother   ? Cancer Mother   ? Malignant hyperthermia Brother   ? Heart disease Brother   ? Asthma Brother   ? COPD Brother   ? Heart disease Father   ? Cancer Father   ? ADD / ADHD Daughter   ? ADD / ADHD Son   ? ? ?Current Outpatient Medications on File Prior to Visit  ?Medication Sig Dispense Refill  ? albuterol (PROVENTIL HFA;VENTOLIN HFA) 108 (90 Base) MCG/ACT inhaler Inhale 2 puffs into the lungs every 6 (six) hours as needed for wheezing or shortness of breath.  (Patient not taking: Reported on 12/09/2021)    ? cetirizine (ZYRTEC) 10 MG tablet Take 10 mg by mouth daily as needed for allergies.  (Patient not taking: Reported on 12/09/2021)    ? ?No current facility-administered medications on file prior to visit.  ? ? ?Allergies  ?Allergen Reactions  ? Penicillins Anaphylaxis  ?  Did it involve swelling of the face/tongue/throat, SOB, or low BP? Yes ?Did it involve sudden or severe rash/hives, skin peeling, or any reaction on the inside of your mouth or nose? No ?Did you need to seek medical attention at a hospital or doctor's office? Yes ?When did it last happen?  Childhood allergy     ?If all above answers are ?NO?, may proceed with cephalosporin use. ?  ? ? ?Social History  ? ?Substance and Sexual Activity  ?Alcohol Use Yes  ? Comment: Occasionally  ? ? ?  Social History  ? ?Tobacco Use  ?Smoking Status Every Day  ? Packs/day: 1.00  ? Types: Cigarettes  ?Smokeless Tobacco Never  ? ? ?Review of Systems  ?Constitutional: Negative.   ?HENT: Negative.    ?Eyes: Negative.   ?Respiratory: Negative.    ?Cardiovascular: Negative.   ?Gastrointestinal: Negative.   ?Genitourinary: Negative.   ?Musculoskeletal: Negative.   ?Skin: Negative.   ?Neurological: Negative.   ?Endo/Heme/Allergies: Negative.   ?Psychiatric/Behavioral: Negative.    ? ?Objective  ? ?Vitals:  ? 12/22/21 1451  ?BP: 138/90  ?Pulse: 72  ?Resp:  14  ?Temp: 98.4 ?F (36.9 ?C)  ?SpO2: 93%  ? ? ?Physical Exam ?Vitals reviewed.  ?Constitutional:   ?   Appearance: Normal appearance. He is not ill-appearing.  ?HENT:  ?   Head: Normocephalic and atraumatic.  ?Cardiovascular:  ?   Rate and Rhythm: Normal rate and regular rhythm.  ?   Heart sounds: Normal heart sounds. No murmur heard. ?  No friction rub. No gallop.  ?Pulmonary:  ?   Effort: Pulmonary effort is normal. No respiratory distress.  ?   Breath sounds: Normal breath sounds. No stridor. No wheezing, rhonchi or rales.  ?   Comments: Port-A-Cath in place left upper chest ?Skin: ?   General: Skin is warm and dry.  ?Neurological:  ?   Mental Status: He is alert and oriented to person, place, and time.  ? ?Oncology notes reviewed ?Assessment  ?Hodgkin's lymphoma, finished with chemotherapy ?Plan  ?Patient is scheduled for Port-A-Cath removal in the minor procedure room on 12/27/2021.  The risks and benefits of the procedure were fully explained to the patient, who gave informed consent. ?

## 2021-12-27 ENCOUNTER — Encounter (HOSPITAL_COMMUNITY)
Admission: RE | Disposition: A | Discharge status: Home / Self Care | Payer: Self-pay | Source: Home / Self Care | Attending: General Surgery

## 2021-12-27 ENCOUNTER — Ambulatory Visit (HOSPITAL_COMMUNITY)
Admission: RE | Admit: 2021-12-27 | Discharge: 2021-12-27 | Disposition: A | Discharge status: Home / Self Care | Payer: Medicaid Other | Attending: General Surgery | Admitting: General Surgery

## 2021-12-27 DIAGNOSIS — Z452 Encounter for adjustment and management of vascular access device: Secondary | ICD-10-CM | POA: Insufficient documentation

## 2021-12-27 DIAGNOSIS — Z9221 Personal history of antineoplastic chemotherapy: Secondary | ICD-10-CM | POA: Diagnosis not present

## 2021-12-27 DIAGNOSIS — C8190 Hodgkin lymphoma, unspecified, unspecified site: Secondary | ICD-10-CM | POA: Diagnosis not present

## 2021-12-27 HISTORY — PX: PORT-A-CATH REMOVAL: SHX5289

## 2021-12-27 SURGERY — MINOR REMOVAL PORT-A-CATH
Anesthesia: LOCAL

## 2021-12-27 MED ORDER — LIDOCAINE HCL (PF) 1 % IJ SOLN
INTRAMUSCULAR | Status: DC | PRN
Start: 2021-12-27 — End: 2021-12-27
  Administered 2021-12-27: 7 mL

## 2021-12-27 MED ORDER — LIDOCAINE HCL (PF) 1 % IJ SOLN
INTRAMUSCULAR | Status: AC
  Filled 2021-12-27: qty 30

## 2021-12-27 SURGICAL SUPPLY — 20 items
APPLICATOR CHLORAPREP 10.5 ORG (MISCELLANEOUS) ×2 IMPLANT
CLOTH BEACON ORANGE TIMEOUT ST (SAFETY) ×2 IMPLANT
DECANTER SPIKE VIAL GLASS SM (MISCELLANEOUS) ×2 IMPLANT
DERMABOND ADVANCED (GAUZE/BANDAGES/DRESSINGS) ×1
DERMABOND ADVANCED .7 DNX12 (GAUZE/BANDAGES/DRESSINGS) ×1 IMPLANT
DRAPE HALF SHEET 40X57 (DRAPES) IMPLANT
ELECT REM PT RETURN 9FT ADLT (ELECTROSURGICAL) ×2
ELECTRODE REM PT RTRN 9FT ADLT (ELECTROSURGICAL) ×1 IMPLANT
GLOVE SURG POLYISO LF SZ7.5 (GLOVE) IMPLANT
GLOVE SURG UNDER POLY LF SZ7 (GLOVE) IMPLANT
GOWN STRL REUS W/TWL LRG LVL3 (GOWN DISPOSABLE) IMPLANT
NDL HYPO 25X1 1.5 SAFETY (NEEDLE) ×1 IMPLANT
NEEDLE HYPO 25X1 1.5 SAFETY (NEEDLE) ×2 IMPLANT
PENCIL SMOKE EVACUATOR COATED (MISCELLANEOUS) IMPLANT
SPONGE GAUZE 2X2 8PLY STRL LF (GAUZE/BANDAGES/DRESSINGS) ×2 IMPLANT
SUT MNCRL AB 4-0 PS2 18 (SUTURE) ×2 IMPLANT
SUT VIC AB 3-0 SH 27 (SUTURE) ×2
SUT VIC AB 3-0 SH 27X BRD (SUTURE) ×1 IMPLANT
SYR CONTROL 10ML LL (SYRINGE) ×2 IMPLANT
TOWEL OR 17X26 4PK STRL BLUE (TOWEL DISPOSABLE) ×2 IMPLANT

## 2021-12-27 NOTE — Op Note (Signed)
Patient:  Nathan Waters ? ?DOB:  14-Mar-1981 ? ?MRN:  [id number] ? ? ?Preop Diagnosis: Hodgkin's lymphoma, finished with chemotherapy ? ?Postop Diagnosis: Same ? ?Procedure: Port-A-Cath removal ? ?Surgeon: Aviva Signs, MD ? ?Anes: Local ? ?Indications: Patient is a 41 year old white male who has finished chemotherapy for Hodgkin's lymphoma and wants his Port-A-Cath removed.  The risks and benefits of the procedure including bleeding and infection were fully explained to the patient, who gave informed consent. ? ?Procedure note: The patient was placed in supine position.  The procedure was performed in the minor procedure room.  The left upper chest was prepped and draped using the usual sterile technique with ChloraPrep.  Surgical site confirmation was performed.  1% Xylocaine was used for local anesthesia. ? ?An incision was made through the previous surgical incision site.  This was taken down to the Port-A-Cath.  The Port-A-Cath was removed in total without difficulty.  It was disposed of.  The subcutaneous layer was reapproximated using a 3-0 Vicryl interrupted suture.  The skin was closed using a 4-0 Monocryl subcuticular suture.  Dermabond was applied. ? ?All tape and needle counts were correct at the end of the procedure.  The patient was discharged home from the minor procedure room in good and stable condition. ? ?Complications: None ? ?EBL: Minimal ? ?Specimen: None ? ? ?  ?

## 2021-12-27 NOTE — Interval H&P Note (Signed)
History and Physical Interval Note: ? ?12/27/2021 ?10:02 AM ? ?Nathan Waters  has presented today for surgery, with the diagnosis of Hodgkin Lymphoma.  The various methods of treatment have been discussed with the patient and family. After consideration of risks, benefits and other options for treatment, the patient has consented to  Procedure(s): ?MINOR REMOVAL PORT-A-CATH (N/A) as a surgical intervention.  The patient's history has been reviewed, patient examined, no change in status, stable for surgery.  I have reviewed the patient's chart and labs.  Questions were answered to the patient's satisfaction.   ? ? ?Aviva Signs ? ? ?

## 2021-12-28 ENCOUNTER — Encounter (HOSPITAL_COMMUNITY): Payer: Self-pay | Admitting: General Surgery

## 2022-01-12 ENCOUNTER — Other Ambulatory Visit: Payer: Self-pay

## 2022-01-12 ENCOUNTER — Encounter (HOSPITAL_COMMUNITY): Payer: Self-pay

## 2022-01-12 ENCOUNTER — Emergency Department (HOSPITAL_COMMUNITY)
Admission: EM | Admit: 2022-01-12 | Discharge: 2022-01-13 | Disposition: A | Discharge status: Home / Self Care | Payer: Medicaid Other | Attending: Emergency Medicine | Admitting: Emergency Medicine

## 2022-01-12 DIAGNOSIS — L02412 Cutaneous abscess of left axilla: Secondary | ICD-10-CM | POA: Insufficient documentation

## 2022-01-12 DIAGNOSIS — R1110 Vomiting, unspecified: Secondary | ICD-10-CM | POA: Diagnosis not present

## 2022-01-12 DIAGNOSIS — J45909 Unspecified asthma, uncomplicated: Secondary | ICD-10-CM | POA: Diagnosis not present

## 2022-01-12 DIAGNOSIS — I10 Essential (primary) hypertension: Secondary | ICD-10-CM | POA: Insufficient documentation

## 2022-01-12 DIAGNOSIS — Z9861 Coronary angioplasty status: Secondary | ICD-10-CM | POA: Insufficient documentation

## 2022-01-12 DIAGNOSIS — Z8571 Personal history of Hodgkin lymphoma: Secondary | ICD-10-CM | POA: Diagnosis not present

## 2022-01-12 DIAGNOSIS — F17210 Nicotine dependence, cigarettes, uncomplicated: Secondary | ICD-10-CM | POA: Insufficient documentation

## 2022-01-12 DIAGNOSIS — I2510 Atherosclerotic heart disease of native coronary artery without angina pectoris: Secondary | ICD-10-CM | POA: Insufficient documentation

## 2022-01-12 DIAGNOSIS — L02213 Cutaneous abscess of chest wall: Secondary | ICD-10-CM | POA: Diagnosis not present

## 2022-01-12 DIAGNOSIS — L0291 Cutaneous abscess, unspecified: Secondary | ICD-10-CM

## 2022-01-12 HISTORY — DX: Malignant (primary) neoplasm, unspecified: C80.1

## 2022-01-12 NOTE — ED Triage Notes (Signed)
Pt arrived via POV c/o large abscess/boil under left arm. Pt reports swelling has been progressively getting worse over past week. Draining it and applying warm compresses at home, Pt reports, have been unsuccessful.  ?

## 2022-01-13 MED ORDER — DOXYCYCLINE HYCLATE 100 MG PO CAPS: 100.0000 mg | ORAL_CAPSULE | Freq: Two times a day (BID) | ORAL | 0 refills | Status: AC

## 2022-01-13 MED ORDER — LIDOCAINE HCL (PF) 1 % IJ SOLN
5.0000 mL | Freq: Once | INTRAMUSCULAR | Status: AC
  Administered 2022-01-13: 5 mL
  Filled 2022-01-13: qty 5

## 2022-01-13 NOTE — ED Provider Notes (Signed)
?[place] DEPT ?[facility] Emergency Department ?Provider Note ?MRN:  [id number]  ?Arrival date & time: 01/13/22    ? ?Chief Complaint   ?Abscess ?  ?History of Present Illness   ?Nathan Waters is a 41 y.o. year-old male with a history of hypertension, CAD presenting to the ED with chief complaint of emesis. ? ?Painful area to the left axilla.  Started as a red pimple but is getting bigger.  No fever, no other complaints. ? ?Review of Systems  ?A thorough review of systems was obtained and all systems are negative except as noted in the HPI and PMH.  ? ?Patient's Health History   ? ?Past Medical History:  ?Diagnosis Date  ? Asthma   ? Cancer [facility])   ? stage 3 hodgkins lymphoma  ? Coronary atherosclerosis of native coronary artery   ? BMS to mid circumflex 11/2011, LVEF 55-60%  ? Essential hypertension   ? Migraine   ? Mixed hyperlipidemia   ? STEMI (ST elevation myocardial infarction) ([place])   ? Complicated by VF arrest 11/2011  ?  ?Past Surgical History:  ?Procedure Laterality Date  ? AXILLARY LYMPH NODE BIOPSY Left 07/26/2019  ? Procedure: AXILLARY LYMPH NODE BIOPSY;  Surgeon: Aviva Signs, MD;  Location: [facility];  Service: General;  Laterality: Left;  ? BACK SURGERY    ? CARDIAC CATHETERIZATION    ? CHOLECYSTECTOMY N/A 08/26/2019  ? Procedure: LAPAROSCOPIC CHOLECYSTECTOMY;  Surgeon: Aviva Signs, MD;  Location: [facility];  Service: General;  Laterality: N/A;  ? INGUINAL LYMPH NODE BIOPSY Right 05/26/2021  ? Procedure: INGUINAL LYMPH NODE BIOPSY;  Surgeon: Aviva Signs, MD;  Location: [facility];  Service: General;  Laterality: Right;  ? LEFT HEART CATHETERIZATION WITH CORONARY ANGIOGRAM N/A 11/21/2011  ? Procedure: LEFT HEART CATHETERIZATION WITH CORONARY ANGIOGRAM;  Surgeon: Peter M Martinique, MD;  Location: [facility] CATH LAB;  Service: Cardiovascular;  Laterality: N/A;  ? LYMPH NODE BIOPSY Right 08/26/2019  ? Procedure: LYMPH NODE BIOPSY, INGUINAL;  Surgeon: Aviva Signs, MD;  Location: [facility];  Service: General;   Laterality: Right;  ? PERCUTANEOUS CORONARY STENT INTERVENTION (PCI-S) N/A 11/21/2011  ? Procedure: PERCUTANEOUS CORONARY STENT INTERVENTION (PCI-S);  Surgeon: Peter M Martinique, MD;  Location: [facility] CATH LAB;  Service: Cardiovascular;  Laterality: N/A;  ? PORT-A-CATH REMOVAL N/A 12/27/2021  ? Procedure: MINOR REMOVAL PORT-A-CATH;  Surgeon: Aviva Signs, MD;  Location: [facility];  Service: General;  Laterality: N/A;  ? PORTACATH PLACEMENT Left 10/01/2019  ? Procedure: INSERTION PORT-A-CATH (catheter attached left subclavian);  Surgeon: Aviva Signs, MD;  Location: [facility];  Service: General;  Laterality: Left;  ? SPLENECTOMY    ?  ?Family History  ?Problem Relation Age of Onset  ? Malignant hyperthermia Mother   ? Cancer Mother   ? Malignant hyperthermia Brother   ? Heart disease Brother   ? Asthma Brother   ? COPD Brother   ? Heart disease Father   ? Cancer Father   ? ADD / ADHD Daughter   ? ADD / ADHD Son   ?  ?Social History  ? ?Socioeconomic History  ? Marital status: Divorced  ?  Spouse name: Not on file  ? Number of children: 2  ? Years of education: Not on file  ? Highest education level: Not on file  ?Occupational History  ? Occupation: not employed  ?Tobacco Use  ? Smoking status: Every Day  ?  Packs/day: 1.00  ?  Types: Cigarettes  ? Smokeless tobacco: Never  ?Vaping Use  ?  Vaping Use: Former  ? Start date: 04/27/2012  ?Substance and Sexual Activity  ? Alcohol use: Yes  ?  Comment: Occasionally  ? Drug use: No  ? Sexual activity: Yes  ?  Birth control/protection: Rhythm  ?Other Topics Concern  ? Not on file  ?Social History Narrative  ? Not on file  ? ?Social Determinants of Health  ? ?Financial Resource Strain: Not on file  ?Food Insecurity: Not on file  ?Transportation Needs: Not on file  ?Physical Activity: Not on file  ?Stress: Not on file  ?Social Connections: Not on file  ?Intimate Partner Violence: Not on file  ?  ? ?Physical Exam  ? ?Vitals:  ? 01/12/22 2254  ?BP: (!) 145/83  ?Pulse: 94  ?Resp: 19  ?Temp: 97.9  ?F (36.6 ?C)  ?SpO2: 98%  ?  ?CONSTITUTIONAL: Well-appearing, NAD ?NEURO/PSYCH:  Alert and oriented x 3, no focal deficits ?EYES:  eyes equal and reactive ?ENT/NECK:  no LAD, no JVD ?CARDIO: Regular rate, well-perfused, normal S1 and S2 ?PULM:  CTAB no wheezing or rhonchi ?GI/GU:  non-distended, non-tender ?MSK/SPINE:  No gross deformities, no edema ?SKIN: 4 cm fluctuant erythematous circular nodule to the left lateral chest ? ? ?*Additional and/or pertinent findings included in MDM below ? ?Diagnostic and Interventional Summary  ? ? EKG Interpretation ? ?Date/Time:    ?Ventricular Rate:    ?PR Interval:    ?QRS Duration:   ?QT Interval:    ?QTC Calculation:   ?R Axis:     ?Text Interpretation:   ?  ? ?  ? ?Labs Reviewed - No data to display  ?No orders to display  ?  ?Medications  ?lidocaine (PF) (XYLOCAINE) 1 % injection 5 mL (5 mLs Infiltration Given 01/13/22 0120)  ?  ? ?Procedures  /  Critical Care ?Marland Kitchen.Incision and Drainage ? ?Date/Time: 01/13/2022 1:38 AM ?Performed by: Maudie Flakes, MD ?Authorized by: Maudie Flakes, MD  ? ?Consent:  ?  Consent obtained:  Verbal ?  Consent given by:  Patient ?  Risks, benefits, and alternatives were discussed: yes   ?  Risks discussed:  Bleeding, damage to other organs, infection, incomplete drainage and pain ?Universal protocol:  ?  Procedure explained and questions answered to patient or proxy's satisfaction: yes   ?  Immediately prior to procedure, a time out was called: yes   ?  Patient identity confirmed:  Verbally with patient ?Location:  ?  Type:  Abscess ?  Size:  4 cm ?  Location:  Trunk ?  Trunk location:  Chest ?Pre-procedure details:  ?  Skin preparation:  Chlorhexidine with alcohol ?Sedation:  ?  Sedation type:  None ?Anesthesia:  ?  Anesthesia method:  Local infiltration ?  Local anesthetic:  Lidocaine 1% w/o epi ?Procedure type:  ?  Complexity:  Complex ?Procedure details:  ?  Incision types:  Cruciate ?  Incision depth:  Subcutaneous ?  Wound management:   Probed and deloculated and debrided ?  Drainage:  Purulent ?  Drainage amount:  Copious ?  Wound treatment:  Wound left open ?  Packing materials:  None ?Post-procedure details:  ?  Procedure completion:  Tolerated well, no immediate complications ? ?ED Course and Medical Decision Making  ?Initial Impression and Ddx ?Exam seems consistent with abscess, will perform I&D.  No systemic symptoms or any other complaints. ? ?Past medical/surgical history that increases complexity of ED encounter: History of Hodgkin's lymphoma, history of CAD. ? ?Interpretation of Diagnostics ?Not applicable ? ?  Patient Reassessment and Ultimate Disposition/Management ?Drained as described above.  Discharge home ? ?Patient management required discussion with the following services or consulting groups:  None ? ?Complexity of Problems Addressed ?Acute complicated illness or Injury ? ?Additional Data Reviewed and Analyzed ?Further history obtained from: ?Further history from spouse/family member ? ?Additional Factors Impacting ED Encounter Risk ?Prescriptions and Minor Procedures ? ?Barth Kirks. Sedonia Small, MD ?[facility] Emergency Medicine ?[place] [email or web address] ? ?Final Clinical Impressions(s) / ED Diagnoses  ? ?  ICD-10-CM   ?1. Abscess  L02.91   ?  ?  ?ED Discharge Orders   ? ?      Ordered  ?  doxycycline (VIBRAMYCIN) 100 MG capsule  2 times daily       ? 01/13/22 0137  ? ?  ?  ? ?  ?  ? ?Discharge Instructions Discussed with and Provided to Patient:  ? ? ? ?Discharge Instructions   ? ?  ?You were evaluated in the Emergency Department and after careful evaluation, we did not find any emergent condition requiring admission or further testing in the hospital. ? ?Your exam/testing today is overall reassuring.  Symptoms due to an abscess, which we drained here in the emergency department.  The site should drain a bit for the next few days.  This is normal.  Take the doxycycline antibiotic as directed to promote  healing. ? ?Please return to the Emergency Department if you experience any worsening of your condition.   Thank you for allowing [place] to be a part of your care. ? ? ? ? ?  ?Maudie Flakes, MD ?01/13/22 0139 ? ?

## 2022-01-13 NOTE — Discharge Instructions (Signed)
You were evaluated in the Emergency Department and after careful evaluation, we did not find any emergent condition requiring admission or further testing in the hospital. ? ?Your exam/testing today is overall reassuring.  Symptoms due to an abscess, which we drained here in the emergency department.  The site should drain a bit for the next few days.  This is normal.  Take the doxycycline antibiotic as directed to promote healing. ? ?Please return to the Emergency Department if you experience any worsening of your condition.   Thank you for allowing [place] to be a part of your care. ?

## 2022-02-24 DIAGNOSIS — L600 Ingrowing nail: Secondary | ICD-10-CM | POA: Diagnosis not present

## 2022-03-04 ENCOUNTER — Encounter (HOSPITAL_COMMUNITY): Payer: Medicaid Other

## 2022-03-26 ENCOUNTER — Other Ambulatory Visit: Payer: Self-pay | Admitting: Nurse Practitioner

## 2022-05-01 ENCOUNTER — Encounter (HOSPITAL_COMMUNITY): Payer: Self-pay

## 2022-05-01 ENCOUNTER — Emergency Department (HOSPITAL_COMMUNITY)
Admission: EM | Admit: 2022-05-01 | Discharge: 2022-05-01 | Disposition: A | Discharge status: Home / Self Care | Payer: Medicaid Other | Attending: Emergency Medicine | Admitting: Emergency Medicine

## 2022-05-01 ENCOUNTER — Other Ambulatory Visit: Payer: Self-pay

## 2022-05-01 DIAGNOSIS — I10 Essential (primary) hypertension: Secondary | ICD-10-CM | POA: Diagnosis not present

## 2022-05-01 DIAGNOSIS — M545 Low back pain, unspecified: Secondary | ICD-10-CM | POA: Diagnosis present

## 2022-05-01 DIAGNOSIS — J45909 Unspecified asthma, uncomplicated: Secondary | ICD-10-CM | POA: Diagnosis not present

## 2022-05-01 DIAGNOSIS — Z8571 Personal history of Hodgkin lymphoma: Secondary | ICD-10-CM | POA: Diagnosis not present

## 2022-05-01 DIAGNOSIS — M5431 Sciatica, right side: Secondary | ICD-10-CM

## 2022-05-01 DIAGNOSIS — M5441 Lumbago with sciatica, right side: Secondary | ICD-10-CM | POA: Insufficient documentation

## 2022-05-01 DIAGNOSIS — I2510 Atherosclerotic heart disease of native coronary artery without angina pectoris: Secondary | ICD-10-CM | POA: Insufficient documentation

## 2022-05-01 MED ORDER — DEXAMETHASONE SODIUM PHOSPHATE 10 MG/ML IJ SOLN
10.0000 mg | Freq: Once | INTRAMUSCULAR | Status: AC
  Administered 2022-05-01: 10 mg via INTRAMUSCULAR
  Filled 2022-05-01: qty 1

## 2022-05-01 MED ORDER — KETOROLAC TROMETHAMINE 30 MG/ML IJ SOLN
15.0000 mg | Freq: Once | INTRAMUSCULAR | Status: AC
  Administered 2022-05-01: 15 mg via INTRAMUSCULAR
  Filled 2022-05-01: qty 1

## 2022-05-01 MED ORDER — METHOCARBAMOL 500 MG PO TABS
500.0000 mg | ORAL_TABLET | Freq: Once | ORAL | Status: AC
Start: 2022-05-01 — End: 2022-05-01
  Administered 2022-05-01: 500 mg via ORAL
  Filled 2022-05-01: qty 1

## 2022-05-01 MED ORDER — METHYLPREDNISOLONE 4 MG PO TBPK: ORAL_TABLET | ORAL | 0 refills | Status: DC

## 2022-05-01 MED ORDER — NAPROXEN 500 MG PO TABS: 500.0000 mg | ORAL_TABLET | Freq: Two times a day (BID) | ORAL | 0 refills | Status: DC

## 2022-05-01 MED ORDER — METHOCARBAMOL 500 MG PO TABS: 500.0000 mg | ORAL_TABLET | Freq: Two times a day (BID) | ORAL | 0 refills | Status: DC

## 2022-05-01 NOTE — ED Triage Notes (Signed)
Pt reports pain on right side hip and back. Hx of back surgery.

## 2022-05-01 NOTE — ED Provider Notes (Signed)
[facility] EMERGENCY DEPARTMENT Provider Note   CSN: [id number] Arrival date & time: 05/01/22  5462     History  No chief complaint on file.   Nathan Waters is a 41 y.o. male.  Nathan Waters is a 41 y.o. male with a history of STEMI, CAD, hypertension, hyperlipidemia, asthma, Hodgkin's lymphoma, migraines, who presents to the emergency department for evaluation of back pain.  Patient reports right low back pain radiating into his hip and leg.  Pain worsening over the last few days.  Worse with movement or heavy lifting.  No associated numbness or weakness in the lower extremities.  Decreased mobility due to pain.  No loss of bowel or bladder control or saddle anesthesia.  No associated fevers or chills.  No history of IV drug use.  The history is provided by the patient and the spouse.       Home Medications Prior to Admission medications   Medication Sig Start Date End Date Taking? Authorizing Provider  albuterol (PROVENTIL HFA;VENTOLIN HFA) 108 (90 Base) MCG/ACT inhaler Inhale 2 puffs into the lungs every 6 (six) hours as needed for wheezing or shortness of breath.    [provider]  diphenhydrAMINE (BENADRYL) 25 MG tablet Take 25 mg by mouth daily as needed (seasonal allergies).    [provider]  ibuprofen (ADVIL) 200 MG tablet Take 800 mg by mouth every 8 (eight) hours as needed (headache).    [provider]  lidocaine-prilocaine (EMLA) cream Apply 1 application. topically once. Prior to procedure.    [provider]      Allergies    Penicillins    Review of Systems   Review of Systems  Constitutional:  Negative for chills and fever.  HENT: Negative.    Respiratory:  Negative for shortness of breath.   Cardiovascular:  Negative for chest pain.  Gastrointestinal:  Negative for abdominal pain, constipation, diarrhea, nausea and vomiting.  Genitourinary:  Negative for dysuria, flank pain, frequency and hematuria.   Musculoskeletal:  Positive for back pain. Negative for arthralgias, gait problem, joint swelling, myalgias and neck pain.  Skin:  Negative for color change, rash and wound.  Neurological:  Negative for weakness and numbness.    Physical Exam Updated Vital Signs BP (!) 130/98 (BP Location: Right Arm)   Pulse 81   Temp 97.9 F (36.6 C) (Oral)   Resp 20   Ht '5\' 11"'$  (1.803 m)   Wt 121.1 kg   SpO2 96%   BMI 37.24 kg/m  Physical Exam Vitals and nursing note reviewed.  Constitutional:      General: He is not in acute distress.    Appearance: He is well-developed. He is not diaphoretic.  HENT:     Head: Atraumatic.  Eyes:     General:        Right eye: No discharge.        Left eye: No discharge.  Cardiovascular:     Pulses:          Radial pulses are 2+ on the right side and 2+ on the left side.       Dorsalis pedis pulses are 2+ on the right side and 2+ on the left side.       Posterior tibial pulses are 2+ on the right side and 2+ on the left side.  Pulmonary:     Effort: Pulmonary effort is normal. No respiratory distress.  Abdominal:     General: Bowel sounds are normal. There  is no distension.     Palpations: Abdomen is soft. There is no mass.     Tenderness: There is no abdominal tenderness. There is no guarding.     Comments: Abdomen soft, nondistended, nontender to palpation in all quadrants without guarding or peritoneal signs, no CVA tenderness bilaterally  Musculoskeletal:     Cervical back: Neck supple.     Comments: Tenderness to palpation over right low back.  Pain made worse with range of motion of the lower extremities, positive straight leg raise on the right.  Skin:    General: Skin is warm and dry.     Capillary Refill: Capillary refill takes less than 2 seconds.  Neurological:     Mental Status: He is alert and oriented to person, place, and time.     Comments: Alert, clear speech, following commands. Moving all extremities without  difficulty. Bilateral lower extremities with 5/5 strength in proximal and distal muscle groups and with dorsi and plantar flexion. Sensation intact in bilateral lower extremities. 2+ patellar DTRs bilaterally. Ambulatory with steady gait  Psychiatric:        Behavior: Behavior normal.     ED Results / Procedures / Treatments   Labs (all labs ordered are listed, but only abnormal results are displayed) Labs Reviewed - No data to display  EKG None  Radiology No results found.  Procedures Procedures    Medications Ordered in ED Medications - No data to display  ED Course/ Medical Decision Making/ A&P                           Medical Decision Making Risk Prescription drug management.   Patient presents to the ED with complaints of  back pain.  Nontoxic, vitals significant for very mild hypertension.   Additional history obtained:  Additional history obtained from chart review & nursing note review.    ED Course:  Patient has no back pain red flags.  No neurologic deficits, ambulatory- doubt cauda equina syndrome or acute cord compression. Afebrile, no hx of IVDU- doubt epidural abscess. No urinary sxs- feel that UTI/nephrolithiasis. Symmetric pulses, not a tearing sensation, overall well appearing, feel that dissection is unlikely at this time. Favor musculoskeletal pain- likely sciatica. Will treat with steroid, Naproxen and Robaxin, discussed with patient that they are not to drive or operate heavy machinery while taking Robaxin. I discussed treatment plan, need for PCP follow-up, and return precautions with the patient. Provided opportunity for questions, patient confirmed understanding and is in agreement with plan.   Portions of this note were generated with [employer]. Dictation errors may occur despite best attempts at proofreading.          Final Clinical Impression(s) / ED Diagnoses Final diagnoses:  Sciatica of right side    Rx / DC  Orders ED Discharge Orders          Ordered    naproxen (NAPROSYN) 500 MG tablet  2 times daily        05/01/22 1803    methocarbamol (ROBAXIN) 500 MG tablet  2 times daily        05/01/22 1803    methylPREDNISolone (MEDROL DOSEPAK) 4 MG TBPK tablet        05/01/22 1803              Nathan Larsen, PA-C 05/17/22 1610    Davonna Belling, MD 05/23/22 [id number]

## 2022-05-01 NOTE — Discharge Instructions (Addendum)
You were seen here today for Back Pain: Low back pain is discomfort in the lower back that may be due to injuries to muscles and ligaments around the spine. Occasionally, it may be caused by a problem to a part of the spine called a disc. Your back pain should be treated with medicines listed below as well as back exercises and this back pain should get better over the next 2 weeks. Most patients get completely well in 4 weeks. It is important to know however, if you develop severe or worsening pain, low back pain with fever, numbness, weakness or inability to walk or urinate, you should return to the ER immediately.  Please follow up with your doctor this week for a recheck if still having symptoms.  HOME INSTRUCTIONS Self - care:  The application of heat can help soothe the pain.  Maintaining your daily activities, including walking (this is encouraged), as it will help you get better faster than just staying in bed. Do not life, push, pull anything more than 10 pounds for the next week. I am attaching back exercises that you can do at home to help facilitate your recovery.   Back Exercises - I have attached a handout on back exercises that can be done at home to help facilitate your recovery.   Medications are also useful to help with pain control.   Acetaminophen.  This medication is generally safe, and found over the counter. Take as directed for your age. You should not take more than 8 of the extra strength ('500mg'$ ) pills a day (max dose is '4000mg'$  total OVER one day)  Non steroidal anti inflammatory: This includes medications including Ibuprofen, naproxen and Mobic; These medications help both pain and swelling and are very useful in treating back pain.  They should be taken with food, as they can cause stomach upset, and more seriously, stomach bleeding. Do not combine the medications.  Muscle relaxants:  These medications can help with muscle tightness that is a cause of lower back pain.  Most  of these medications can cause drowsiness, and it is not safe to drive or use dangerous machinery while taking them. They are primarily helpful when taken at night before sleep.  Prednisone -  This is an oral steroid.  This medication is best taken with food in the morning.  Please note that this medication can cause anxiety, mood swings, muscle fatigue, increased hunger, weight gain (sodium/fluid retention), poor sleep as well as other symptoms. If you are a diabetic, please monitor your blood sugars at home as this medication can increase your blood sugars. Call your pharmacist if you have any questions.  You will need to follow up with your primary healthcare provider in 1-2 weeks for reassessment and persistent symptoms.  Be aware that if you develop new symptoms, such as a fever, leg weakness, difficulty with or loss of control of your urine or bowels, abdominal pain, or more severe pain, you will need to seek medical attention and/or return to the Emergency department. Additional Information:  Your vital signs today were: BP (!) 130/98 (BP Location: Right Arm)   Pulse 81   Temp 97.9 F (36.6 C) (Oral)   Resp 20   Ht '5\' 11"'$  (1.803 m)   Wt 121.1 kg   SpO2 96%   BMI 37.24 kg/m  If your blood pressure (BP) was elevated above 135/85 this visit, please have this repeated by your doctor within one month. ---------------

## 2022-06-02 ENCOUNTER — Encounter (HOSPITAL_COMMUNITY)
Admission: RE | Admit: 2022-06-02 | Discharge: 2022-06-02 | Disposition: A | Discharge status: Home / Self Care | Payer: Medicaid Other | Source: Ambulatory Visit | Attending: Hematology | Admitting: Hematology

## 2022-06-02 ENCOUNTER — Inpatient Hospital Stay: Payer: Medicaid Other | Attending: Hematology

## 2022-06-02 ENCOUNTER — Other Ambulatory Visit: Payer: Self-pay | Admitting: *Deleted

## 2022-06-02 DIAGNOSIS — C8108 Nodular lymphocyte predominant Hodgkin lymphoma, lymph nodes of multiple sites: Secondary | ICD-10-CM

## 2022-06-02 DIAGNOSIS — J432 Centrilobular emphysema: Secondary | ICD-10-CM | POA: Diagnosis not present

## 2022-06-02 DIAGNOSIS — C8190 Hodgkin lymphoma, unspecified, unspecified site: Secondary | ICD-10-CM | POA: Insufficient documentation

## 2022-06-02 DIAGNOSIS — R590 Localized enlarged lymph nodes: Secondary | ICD-10-CM | POA: Diagnosis not present

## 2022-06-02 LAB — PHOSPHORUS: Phosphorus: 2.1 mg/dL — ABNORMAL LOW (ref 2.5–4.6)

## 2022-06-02 LAB — COMPREHENSIVE METABOLIC PANEL
ALT: 33 U/L (ref 0–44)
AST: 25 U/L (ref 15–41)
Albumin: 4 g/dL (ref 3.5–5.0)
Alkaline Phosphatase: 105 U/L (ref 38–126)
Anion gap: 5 (ref 5–15)
BUN: 6 mg/dL (ref 6–20)
CO2: 25 mmol/L (ref 22–32)
Calcium: 9.2 mg/dL (ref 8.9–10.3)
Chloride: 110 mmol/L (ref 98–111)
Creatinine, Ser: 0.85 mg/dL (ref 0.61–1.24)
GFR, Estimated: >60 mL/min (ref >60)
Glucose, Bld: 104 mg/dL — ABNORMAL HIGH (ref 70–99)
Potassium: 3.9 mmol/L (ref 3.5–5.1)
Sodium: 140 mmol/L (ref 135–145)
Total Bilirubin: 0.8 mg/dL (ref 0.3–1.2)
Total Protein: 7.1 g/dL (ref 6.5–8.1)

## 2022-06-02 LAB — CBC WITH DIFFERENTIAL/PLATELET
Abs Immature Granulocytes: 0.05 10*3/uL (ref 0.00–0.07)
Basophils Absolute: 0.1 10*3/uL (ref 0.0–0.1)
Basophils Relative: 0 %
Eosinophils Absolute: 0.3 10*3/uL (ref 0.0–0.5)
Eosinophils Relative: 2 %
HCT: 46 % (ref 39.0–52.0)
Hemoglobin: 15.8 g/dL (ref 13.0–17.0)
Immature Granulocytes: 0 %
Lymphocytes Relative: 42 %
Lymphs Abs: 6.1 10*3/uL — ABNORMAL HIGH (ref 0.7–4.0)
MCH: 33.3 pg (ref 26.0–34.0)
MCHC: 34.3 g/dL (ref 30.0–36.0)
MCV: 97 fL (ref 80.0–100.0)
Monocytes Absolute: 1.1 10*3/uL — ABNORMAL HIGH (ref 0.1–1.0)
Monocytes Relative: 7 %
Neutro Abs: 6.9 10*3/uL (ref 1.7–7.7)
Neutrophils Relative %: 49 %
Platelets: 328 10*3/uL (ref 150–400)
RBC: 4.74 MIL/uL (ref 4.22–5.81)
RDW: 14.4 % (ref 11.5–15.5)
WBC: 14.5 10*3/uL — ABNORMAL HIGH (ref 4.0–10.5)
nRBC: 0 % (ref 0.0–0.2)

## 2022-06-02 LAB — URIC ACID: Uric Acid, Serum: 5.8 mg/dL (ref 3.7–8.6)

## 2022-06-02 LAB — LACTATE DEHYDROGENASE: LDH: 106 U/L (ref 98–192)

## 2022-06-02 LAB — SEDIMENTATION RATE: Sed Rate: 10 mm/hr (ref 0–16)

## 2022-06-02 LAB — MAGNESIUM: Magnesium: 2.2 mg/dL (ref 1.7–2.4)

## 2022-06-02 MED ORDER — FLUDEOXYGLUCOSE F - 18 (FDG) INJECTION
13.6100 | Freq: Once | INTRAVENOUS | Status: AC | PRN
  Administered 2022-06-02: 13.61 via INTRAVENOUS

## 2022-06-09 ENCOUNTER — Inpatient Hospital Stay (HOSPITAL_BASED_OUTPATIENT_CLINIC_OR_DEPARTMENT_OTHER): Payer: Medicaid Other | Admitting: Hematology

## 2022-06-09 VITALS — BP 124/79 | HR 84 | Temp 98.1°F | Resp 18 | Ht 71.0 in | Wt 273.4 lb

## 2022-06-09 DIAGNOSIS — C8108 Nodular lymphocyte predominant Hodgkin lymphoma, lymph nodes of multiple sites: Secondary | ICD-10-CM | POA: Insufficient documentation

## 2022-06-09 NOTE — Patient Instructions (Signed)
[facility]  Discharge Instructions  You were seen and examined today by Dr. Delton Coombes.  Dr. Delton Coombes discussed your most recent lab work and PET scan which revealed that your lymph nodes have decreased in some areas and increased in others but are still very small.  Repeat PET scan before next visit.  Follow-up as scheduled in 4 months.    Thank you for choosing [facility] to provide your oncology and hematology care.   To afford each patient quality time with our provider, please arrive at least 15 minutes before your scheduled appointment time. You may need to reschedule your appointment if you arrive late (10 or more minutes). Arriving late affects you and other patients whose appointments are after yours.  Also, if you miss three or more appointments without notifying the office, you may be dismissed from the clinic at the provider's discretion.    Again, thank you for choosing [facility].  Our hope is that these requests will decrease the amount of time that you wait before being seen by our physicians.   If you have a lab appointment with the [facility] please come in thru the Main Entrance and check in at the main information desk.           _____________________________________________________________  Should you have questions after your visit to [facility], please contact our office at [phone] and follow the prompts.  Our office hours are 8:00 a.m. to 4:30 p.m. Monday - Thursday and 8:00 a.m. to 2:30 p.m. Friday.  Please note that voicemails left after 4:00 p.m. may not be returned until the following business day.  We are closed weekends and all major holidays.  You do have access to a nurse 24-7, just call the main number to the clinic [phone] and do not press any options, hold on the line and a nurse will answer the phone.    For prescription refill requests, have your  pharmacy contact our office and allow 72 hours.

## 2022-06-09 NOTE — Progress Notes (Signed)
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 BRINLEY TREANOR, a 41 y.o. male, seen for follow-up of Hodgkin's lymphoma.  Denies any fevers, night sweats or weight loss in the last 6 months.  Denies any infections or hospitalizations.  REVIEW OF SYSTEMS:  Review of Systems  Constitutional:  Negative for appetite change, fatigue, fever and unexpected weight change.  Endocrine: Negative for hot flashes.  Musculoskeletal:  Positive for back pain (Chronic back pain stable).  All other systems reviewed and are negative.   PAST MEDICAL/SURGICAL HISTORY:  Past Medical History:  Diagnosis Date   Asthma    Cancer ([place])    stage 3 hodgkins lymphoma   Coronary atherosclerosis of native coronary artery    BMS to mid circumflex 11/2011, LVEF 55-60%   Essential hypertension    Migraine    Mixed hyperlipidemia    STEMI (ST elevation myocardial infarction) ([place])    Complicated by  VF arrest 11/2011   Past Surgical History:  Procedure Laterality Date   AXILLARY LYMPH NODE BIOPSY Left 07/26/2019   Procedure: AXILLARY LYMPH NODE BIOPSY;  Surgeon: Aviva Signs, MD;  Location: [facility]S;  Service: General;  Laterality: Left;   BACK SURGERY     CARDIAC CATHETERIZATION     CHOLECYSTECTOMY N/A 08/26/2019   Procedure: LAPAROSCOPIC CHOLECYSTECTOMY;  Surgeon: Aviva Signs, MD;  Location: [facility];  Service: General;  Laterality: N/A;   INGUINAL LYMPH NODE BIOPSY Right 05/26/2021   Procedure: INGUINAL LYMPH NODE BIOPSY;  Surgeon: Aviva Signs, MD;  Location: [facility];  Service: General;  Laterality: Right;   LEFT HEART CATHETERIZATION WITH CORONARY ANGIOGRAM N/A 11/21/2011   Procedure: LEFT HEART CATHETERIZATION WITH CORONARY ANGIOGRAM;  Surgeon: Peter M Martinique, MD;  Location[facility] C[facility] LAB;  Service: Cardiovascular;  Laterality: N/A;   LYMPH NODE BIOPSY Right 08/26/2019   Procedure: LYMPH NODE BIOPSY, INGUINAL;  Surgeon: Aviva Signs, MD;  Location: [facility];  Service: General;  Laterality: Right;   PERCUTANEOUS CORONARY STENT INTERVENTION (PCI-S) N/A 11/21/2011   Procedure: PERCUTANEOUS CORONARY STENT INTERVENTION (PCI-S);  Surgeon: Peter M Martinique, MD;  Location: [facility] CATH LAB;  Service: Cardiovascular;  Laterality: N/A;   PORT-A-CATH REMOVAL N/A 12/27/2021   Procedure: MINOR REMOVAL PORT-A-CATH;  Surgeon: Aviva Signs, MD;  Location: [facility];  Service: General;  Laterality: N/A;   PORTACATH PLACEMENT Left 10/01/2019   Procedure: INSERTION PORT-A-CATH (catheter attached left subclavian);  Surgeon: Aviva Signs, MD;  Location: [facility];  Service: General;  Laterality: Left;   SPLENECTOMY  SOCIAL HISTORY:  Social History   Socioeconomic History   Marital status: Divorced    Spouse name: Not on file   Number of children: 2   Years of education: Not on file   Highest education level: Not on file  Occupational History   Occupation: not employed  Tobacco Use   Smoking status: Every Day     Packs/day: 1.00    Types: Cigarettes   Smokeless tobacco: Never  Vaping Use   Vaping Use: Former   Start date: 04/27/2012  Substance and Sexual Activity   Alcohol use: Yes    Comment: Occasionally   Drug use: No   Sexual activity: Yes    Birth control/protection: Rhythm  Other Topics Concern   Not on file  Social History Narrative   Not on file   Social Determinants of Health   Financial Resource Strain: Low Risk  (09/23/2020)   Overall Financial Resource Strain (CARDIA)    Difficulty of Paying Living Expenses: Not hard at all  Food Insecurity: No Food Insecurity (09/23/2020)   Hunger Vital Sign    Worried About Running Out of Food in the Last Year: Never true    [place] in the Last Year: Never true  Transportation Needs: No Transportation Needs (09/23/2020)   PRAPARE - Hydrologist (Medical): No    Lack of Transportation (Non-Medical): No  Physical Activity: Unknown (06/27/2019)   Exercise Vital Sign    Days of Exercise per Week: 3 days    Minutes of Exercise per Session: Not on file  Recent Concern: Physical Activity - Insufficiently Active (06/15/2019)   Exercise Vital Sign    Days of Exercise per Week: 2 days    Minutes of Exercise per Session: 30 min  Stress: No Stress Concern Present (09/23/2020)   [place]    Feeling of Stress : Not at all  Social Connections: Socially Isolated (09/23/2020)   Social Connection and Isolation Panel [NHANES]    Frequency of Communication with Friends and Family: More than three times a week    Frequency of Social Gatherings with Friends and Family: Three times a week    Attends Religious Services: Never    Active Member of Clubs or Organizations: No    Attends Archivist Meetings: Never    Marital Status: Divorced  Human resources officer Violence: Not At Risk (09/23/2020)   Humiliation, Afraid, Rape, and Kick questionnaire     Fear of Current or Ex-Partner: No    Emotionally Abused: No    Physically Abused: No    Sexually Abused: No    FAMILY HISTORY:  Family History  Problem Relation Age of Onset   Malignant hyperthermia Mother    Cancer Mother    Malignant hyperthermia Brother    Heart disease Brother    Asthma Brother    COPD Brother    Heart disease Father    Cancer Father    ADD / ADHD Daughter    ADD / ADHD Son     CURRENT MEDICATIONS:  Current Outpatient Medications  Medication Sig Dispense Refill   albuterol (PROVENTIL HFA;VENTOLIN HFA) 108 (90 Base) MCG/ACT inhaler Inhale 2 puffs into the lungs every 6 (six) hours as needed for wheezing or shortness of breath.     diphenhydrAMINE (BENADRYL) 25 MG tablet Take 25 mg by mouth daily as needed (seasonal allergies).     ibuprofen (ADVIL) 200 MG tablet Take 800  mg by mouth every 8 (eight) hours as needed (headache).     lidocaine-prilocaine (EMLA) cream Apply 1 application. topically once. Prior to procedure.     methocarbamol (ROBAXIN) 500 MG tablet Take 1 tablet (500 mg total) by mouth 2 (two) times daily. 20 tablet 0   methylPREDNISolone (MEDROL DOSEPAK) 4 MG TBPK tablet Take as directed 21 tablet 0   naproxen (NAPROSYN) 500 MG tablet Take 1 tablet (500 mg total) by mouth 2 (two) times daily. 30 tablet 0   No current facility-administered medications for this visit.    ALLERGIES:  Allergies  Allergen Reactions   Penicillins Anaphylaxis    Did it involve swelling of the face/tongue/throat, SOB, or low BP? Yes Did it involve sudden or severe rash/hives, skin peeling, or any reaction on the inside of your mouth or nose? No Did you need to seek medical attention at a hospital or doctor's office? Yes When did it last happen?  Childhood allergy     If all above answers are "NO", may proceed with cephalosporin use.     PHYSICAL EXAM:  Performance status (ECOG): 0 - Asymptomatic  Vitals:   06/09/22 1513  BP: 124/79  Pulse: 84  Resp: 18   Temp: 98.1 F (36.7 C)  SpO2: 97%   Wt Readings from Last 3 Encounters:  06/09/22 273 lb 5.9 oz (124 kg)  05/01/22 267 lb (121.1 kg)  01/12/22 273 lb (123.8 kg)   Physical Exam Vitals reviewed.  Constitutional:      Appearance: Normal appearance. He is obese.  Cardiovascular:     Rate and Rhythm: Normal rate and regular rhythm.     Pulses: Normal pulses.     Heart sounds: Normal heart sounds.  Pulmonary:     Effort: Pulmonary effort is normal.     Breath sounds: Normal breath sounds.  Lymphadenopathy:     Upper Body:     Right upper body: No supraclavicular or axillary adenopathy.     Left upper body: No supraclavicular or axillary adenopathy.  Neurological:     General: No focal deficit present.     Mental Status: He is alert and oriented to person, place, and time.  Psychiatric:        Mood and Affect: Mood normal.        Behavior: Behavior normal.      LABORATORY DATA:  I have reviewed the labs as listed.     Latest Ref Rng & Units 06/02/2022   12:06 PM 12/02/2021   10:06 AM 09/25/2021   11:35 PM  CBC  WBC 4.0 - 10.5 K/uL 14.5  12.4  16.2   Hemoglobin 13.0 - 17.0 g/dL 15.8  15.3  14.8   Hematocrit 39.0 - 52.0 % 46.0  45.7  42.6   Platelets 150 - 400 K/uL 328  289  292       Latest Ref Rng & Units 06/02/2022   12:19 PM 12/02/2021   10:06 AM 09/25/2021   11:35 PM  CMP  Glucose 70 - 99 mg/dL 104  116  119   BUN 6 - 20 mg/dL '6  11  11   '$ Creatinine 0.61 - 1.24 mg/dL 0.85  0.74  0.67   Sodium 135 - 145 mmol/L 140  138  138   Potassium 3.5 - 5.1 mmol/L 3.9  3.9  3.3   Chloride 98 - 111 mmol/L 110  108  109   CO2 22 - 32 mmol/L 25  20  23  Calcium 8.9 - 10.3 mg/dL 9.2  9.2  8.9   Total Protein 6.5 - 8.1 g/dL 7.1  6.7    Total Bilirubin 0.3 - 1.2 mg/dL 0.8  0.5    Alkaline Phos 38 - 126 U/L 105  102    AST 15 - 41 U/L 25  23    ALT 0 - 44 U/L 33  30      DIAGNOSTIC IMAGING:  I have independently reviewed the scans and discussed with the patient. NM PET  Image Restag (PS) Skull Base To Thigh  Result Date: 06/06/2022 CLINICAL DATA:  Subsequent treatment strategy for Hodgkin lymphoma. EXAM: NUCLEAR MEDICINE PET SKULL BASE TO THIGH TECHNIQUE: 13.6 mCi F-18 FDG was injected intravenously. Full-ring PET imaging was performed from the skull base to thigh after the radiotracer. CT data was obtained and used for attenuation correction and anatomic localization. Fasting blood glucose: 110 mg/dl COMPARISON:  12/02/2021 PET-CT. FINDINGS: Mediastinal blood pool activity: SUV max 2.7 Liver activity: SUV max 4.1 NECK: Mild hypermetabolism within nonenlarged bilateral neck lymph nodes, mildly decreased in metabolism in the interval. Representative 0.6 cm right level 2 neck lymph node with max SUV 3.3 (series 3/image 38), previously 0.6 cm with max SUV 4.2 using similar measurement technique. Representative 0.8 cm left level 3 neck lymph node with max SUV 3.2, previously 0.8 cm with max SUV 3.8. No newly enlarged or newly hypermetabolic lymph nodes in the neck. Mild asymmetric hypermetabolism in the left nasopharyngeal soft tissues without discrete CT correlate, with decreased FDG uptake in the interval, max SUV 4.7, previous max SUV 7.4. Persistent symmetric hypermetabolism in the pharyngeal tonsils. Incidental CT findings: None. CHEST: No enlarged or hypermetabolic right axillary lymph nodes. Mildly enlarged 1.2 cm left axillary lymph node demonstrates low level FDG uptake with max SUV 2.3 (series 3/image 86), previously 1.3 cm with max SUV 2.4, stable in size and metabolism. No newly enlarged or newly hypermetabolic left axillary nodes. Surgical clips again noted in the left axilla. Previously visualized small focus of cutaneous hypermetabolism in the high left axilla has resolved. New 1.6 cm focus of cutaneous hypermetabolism in the lateral left chest wall with max SUV 6.4 with associated superficial subcutaneous fat stranding (series 3/image 125). No enlarged or  hypermetabolic mediastinal or hilar lymph nodes. No hypermetabolic pulmonary findings. Incidental CT findings: Mild paraseptal and centrilobular emphysema. Stable curvilinear parenchymal banding in the lingula and right middle lobe compatible with postinfectious/postinflammatory scarring. No significant pulmonary nodules. ABDOMEN/PELVIS: Right paraceliac enlarged 1.6 cm node is hypermetabolic with max SUV 5.2 (series 3/image 155), previously 1.1 cm with max SUV 3.3, increased in size and metabolism. Enlarged 1.7 cm porta hepatis node is mildly hypermetabolic with max SUV 3.2 (series 3/image 157), previously 1.7 cm with max SUV 4.2, stable in size and mildly decreased in metabolism. Enlarged mildly hypermetabolic 1.8 cm right external iliac lymph node with max SUV 3.2 (series 3/image 261), previously 1.8 cm with max SUV 1.3, stable in size and mildly increased in metabolism. Mildly enlarged and mildly hypermetabolic 1.1 cm right inguinal lymph node with max SUV 3.9 (series 3/image 262), previously 0.6 cm with max SUV 2.5, increased in size and metabolism. Enlarged mildly hypermetabolic 1.6 cm left external iliac node with max SUV 3.2 (series 3/image 264), previously 1.6 cm with max SUV 1.2, stable in size and mildly increased in metabolism. Mildly enlarged and mildly hypermetabolic 1.1 cm left inguinal node with max SUV 2.9 (series 3/image 270), previously 1.1 cm with max SUV 1.6,  stable in size and mildly increased in metabolism. No abnormal hypermetabolic activity within the liver, pancreas or adrenal glands. Incidental CT findings: Splenectomy. Stable small non hypermetabolic splenules in the splenectomy bed. Cholecystectomy. Punctate nonobstructing 1 mm lower left renal stone. No hydronephrosis. SKELETON: No focal hypermetabolic activity to suggest skeletal metastasis. Incidental CT findings: None. IMPRESSION: 1. Overall findings worrisome for mild interval progression of metabolically active Hodgkin lymphoma  with Deauville score 4. Mildly enlarged and hypermetabolic right paraceliac retroperitoneal lymph node is increased in size and metabolism. A mildly enlarged and mildly hypermetabolic right inguinal lymph node is increased in size and metabolism. Additional mildly enlarged and mildly hypermetabolic bilateral external iliac and left inguinal lymph nodes are increased in metabolism. 2. The nonenlarged mildly hypermetabolic bilateral neck lymph nodes are mildly decreased in metabolism. 3. Mildly asymmetric FDG uptake in the left nasopharyngeal soft tissues, less pronounced. Persistent symmetric hypermetabolism in the pharyngeal tonsils. These findings are nonspecific, favor reactive. 4. New nonspecific small hypermetabolic cutaneous focus in the lateral left chest wall, more likely inflammatory. Previously visualized higher left axillary cutaneous focus of hypermetabolism has resolved. 5. Nonobstructing left nephrolithiasis. Electronically Signed   By: Ilona Sorrel M.D.   On: 06/06/2022 11:45     ASSESSMENT:  1.  NLP Hodgkin's disease: -6 cycles of R-CHOP from 10/23/2019 through 02/04/2020. -PET scan on 03/25/2020 showed most of the lymph nodes with Deauville 2 activity with normal size limits.  2 of the right external iliac lymph nodes remain mildly enlarged.  Right thigh mass also normalized.   PLAN:  1.  NLP Hodgkin's disease: - He had port removed by Dr. Arnoldo Morale. - He does not have any B symptoms. - Reviewed labs from 06/02/2022 which showed normal LFTs and LDH.  CBC was grossly normal with mild leukocytosis which is also stable. - PET scan (06/02/2022): Showed slight increase in the right inguinal lymph node measuring 1.2 cm, previously 0.6 cm, with increased SUV value.  Left inguinal and internal iliac lymph nodes also increased in SUV value but remained the same in size.  Mildly hypermetabolic bilateral neck lymph nodes decreased in metabolic some.  New nonspecific small hypermetabolic cutaneous focus in  the left lateral chest wall likely inflammatory. - Based on these findings, I have recommended repeat PET scan in 4 months with repeat labs.    Orders placed this encounter:  Orders Placed This Encounter  Procedures   NM PET Image Restag (PS) Skull Base To Thigh   Sedimentation rate   Lactate dehydrogenase   CBC with Differential/Platelet   Comprehensive metabolic panel      Derek Jack, MD [place] [phone]

## 2022-09-25 ENCOUNTER — Encounter (HOSPITAL_COMMUNITY): Payer: Self-pay | Admitting: *Deleted

## 2022-09-25 ENCOUNTER — Emergency Department (HOSPITAL_COMMUNITY)
Admission: EM | Admit: 2022-09-25 | Discharge: 2022-09-25 | Disposition: A | Discharge status: Home / Self Care | Payer: Medicaid Other | Attending: Emergency Medicine | Admitting: Emergency Medicine

## 2022-09-25 ENCOUNTER — Other Ambulatory Visit: Payer: Self-pay

## 2022-09-25 ENCOUNTER — Emergency Department (HOSPITAL_COMMUNITY): Payer: Medicaid Other

## 2022-09-25 DIAGNOSIS — N492 Inflammatory disorders of scrotum: Secondary | ICD-10-CM

## 2022-09-25 DIAGNOSIS — I2510 Atherosclerotic heart disease of native coronary artery without angina pectoris: Secondary | ICD-10-CM | POA: Insufficient documentation

## 2022-09-25 DIAGNOSIS — Z8571 Personal history of Hodgkin lymphoma: Secondary | ICD-10-CM | POA: Diagnosis not present

## 2022-09-25 DIAGNOSIS — N50819 Testicular pain, unspecified: Secondary | ICD-10-CM | POA: Diagnosis not present

## 2022-09-25 DIAGNOSIS — Z87438 Personal history of other diseases of male genital organs: Secondary | ICD-10-CM | POA: Diagnosis not present

## 2022-09-25 DIAGNOSIS — Z87828 Personal history of other (healed) physical injury and trauma: Secondary | ICD-10-CM | POA: Diagnosis not present

## 2022-09-25 DIAGNOSIS — L0390 Cellulitis, unspecified: Secondary | ICD-10-CM

## 2022-09-25 LAB — BASIC METABOLIC PANEL
Anion gap: 8 (ref 5–15)
BUN: 7 mg/dL (ref 6–20)
CO2: 23 mmol/L (ref 22–32)
Calcium: 8.8 mg/dL — ABNORMAL LOW (ref 8.9–10.3)
Chloride: 107 mmol/L (ref 98–111)
Creatinine, Ser: 0.67 mg/dL (ref 0.61–1.24)
GFR, Estimated: >60 mL/min (ref >60)
Glucose, Bld: 102 mg/dL — ABNORMAL HIGH (ref 70–99)
Potassium: 3.7 mmol/L (ref 3.5–5.1)
Sodium: 138 mmol/L (ref 135–145)

## 2022-09-25 LAB — CBC WITH DIFFERENTIAL/PLATELET
Abs Immature Granulocytes: 0.03 10*3/uL (ref 0.00–0.07)
Basophils Absolute: 0.1 10*3/uL (ref 0.0–0.1)
Basophils Relative: 0 %
Eosinophils Absolute: 0.4 10*3/uL (ref 0.0–0.5)
Eosinophils Relative: 3 %
HCT: 43.2 % (ref 39.0–52.0)
Hemoglobin: 14.9 g/dL (ref 13.0–17.0)
Immature Granulocytes: 0 %
Lymphocytes Relative: 40 %
Lymphs Abs: 5.3 10*3/uL — ABNORMAL HIGH (ref 0.7–4.0)
MCH: 33.5 pg (ref 26.0–34.0)
MCHC: 34.5 g/dL (ref 30.0–36.0)
MCV: 97.1 fL (ref 80.0–100.0)
Monocytes Absolute: 1 10*3/uL (ref 0.1–1.0)
Monocytes Relative: 8 %
Neutro Abs: 6.5 10*3/uL (ref 1.7–7.7)
Neutrophils Relative %: 49 %
Platelets: 321 10*3/uL (ref 150–400)
RBC: 4.45 MIL/uL (ref 4.22–5.81)
RDW: 13.9 % (ref 11.5–15.5)
WBC: 13.2 10*3/uL — ABNORMAL HIGH (ref 4.0–10.5)
nRBC: 0 % (ref 0.0–0.2)

## 2022-09-25 MED ORDER — IOHEXOL 300 MG/ML  SOLN
100.0000 mL | Freq: Once | INTRAMUSCULAR | Status: AC | PRN
  Administered 2022-09-25: 100 mL via INTRAVENOUS

## 2022-09-25 MED ORDER — SULFAMETHOXAZOLE-TRIMETHOPRIM 800-160 MG PO TABS: 1.0000 | ORAL_TABLET | Freq: Two times a day (BID) | ORAL | 0 refills | Status: AC

## 2022-09-25 MED ORDER — SULFAMETHOXAZOLE-TRIMETHOPRIM 800-160 MG PO TABS
1.0000 | ORAL_TABLET | Freq: Once | ORAL | Status: AC
  Administered 2022-09-25: 1 via ORAL
  Filled 2022-09-25: qty 1

## 2022-09-25 MED ORDER — KETOROLAC TROMETHAMINE 15 MG/ML IJ SOLN
15.0000 mg | Freq: Once | INTRAMUSCULAR | Status: AC
  Administered 2022-09-25: 15 mg via INTRAVENOUS
  Filled 2022-09-25: qty 1

## 2022-09-25 NOTE — ED Notes (Signed)
Patient transported to CT 

## 2022-09-25 NOTE — ED Triage Notes (Addendum)
Pt with testicle pain since yesterday with swelling.  SO states pt with wound behind scrotum x 3-4 months. Denies any burning or pain with urination. Denies any penile discharge.

## 2022-09-25 NOTE — Discharge Instructions (Signed)
You were seen in the emergency department for your scrotal pain.  Your CT scan showed the beginning of a skin infection and this is treated with antibiotics.  You should complete these as prescribed.  You should follow-up with a urologist in the next few days to have your wound rechecked to make sure it is healing well.  You should return to the emergency department if you are having increasing pain, spreading redness, fevers despite the antibiotics or if you have any other new or concerning symptoms.

## 2022-09-25 NOTE — ED Provider Notes (Signed)
[facility] EMERGENCY DEPARTMENT Provider Note   CSN: [id number] Arrival date & time: 09/25/22  1936     History  Chief Complaint  Patient presents with   Testicle Pain    Nathan Waters is a 41 y.o. male.  Patient is a 41 year old male with a past medical history of CAD status post stent placement and Hodgkin's lymphoma now in remission presenting to the emergency department with scrotal and testicle pain.  The patient states that he has had a wound on his scrotum that has been bleeding for the past 3 months.  He states that 2 days ago he started to develop increasing pain in his scrotum and on his left testicle and it felt very swollen yesterday.  He states that he also saw a small amount of pus draining with the blood yesterday.  He denies any fevers or chills, dysuria or hematuria, penile discharge or any other rashes or lesions.  He denies any concern for STDs.  The history is provided by the patient and the spouse.  Testicle Pain       Home Medications Prior to Admission medications   Medication Sig Start Date End Date Taking? Authorizing Provider  sulfamethoxazole-trimethoprim (BACTRIM DS) 800-160 MG tablet Take 1 tablet by mouth 2 (two) times daily for 7 days. 09/25/22 10/02/22 Yes Kingsley, Jordan Hawks K, DO  albuterol (PROVENTIL HFA;VENTOLIN HFA) 108 (90 Base) MCG/ACT inhaler Inhale 2 puffs into the lungs every 6 (six) hours as needed for wheezing or shortness of breath.    [provider]  diphenhydrAMINE (BENADRYL) 25 MG tablet Take 25 mg by mouth daily as needed (seasonal allergies).    [provider]  ibuprofen (ADVIL) 200 MG tablet Take 800 mg by mouth every 8 (eight) hours as needed (headache).    [provider]  lidocaine-prilocaine (EMLA) cream Apply 1 application. topically once. Prior to procedure.    [provider]  methocarbamol (ROBAXIN) 500 MG tablet Take 1 tablet (500 mg total) by mouth 2 (two) times daily. 05/01/22    Jacqlyn Larsen, PA-C  methylPREDNISolone (MEDROL DOSEPAK) 4 MG TBPK tablet Take as directed 05/01/22   Jacqlyn Larsen, PA-C  naproxen (NAPROSYN) 500 MG tablet Take 1 tablet (500 mg total) by mouth 2 (two) times daily. 05/01/22   Jacqlyn Larsen, PA-C      Allergies    Penicillins    Review of Systems   Review of Systems  Genitourinary:  Positive for testicular pain.    Physical Exam Updated Vital Signs BP 103/64   Pulse 79   Temp 98 F (36.7 C) (Oral)   Resp 15   Ht '5\' 11"'$  (1.803 m)   Wt 123.8 kg   SpO2 93%   BMI 38.08 kg/m  Physical Exam Vitals and nursing note reviewed. Exam conducted with a chaperone present [facility] EMT).  Constitutional:      General: He is not in acute distress.    Appearance: Normal appearance.  HENT:     Head: Normocephalic and atraumatic.     Mouth/Throat:     Mouth: Mucous membranes are moist.     Pharynx: Oropharynx is clear.  Eyes:     Extraocular Movements: Extraocular movements intact.     Conjunctiva/sclera: Conjunctivae normal.  Cardiovascular:     Rate and Rhythm: Normal rate and regular rhythm.     Pulses: Normal pulses.     Heart sounds: Normal heart sounds.  Pulmonary:     Effort: Pulmonary effort is  normal.     Breath sounds: Normal breath sounds.  Abdominal:     General: Abdomen is flat.     Palpations: Abdomen is soft.     Tenderness: There is no abdominal tenderness.  Genitourinary:    Penis: Normal.      Comments: Pinpoint wound with active oozing at posterior base of scrotum with mild tenderness to palpation, no significant erythema, warmth or fluctuance, no induration, no palpable crepitus No significant testicular tenderness to palpation bilaterally, no significant scrotal swelling No ulcerations Musculoskeletal:        General: Normal range of motion.     Cervical back: Normal range of motion and neck supple.  Skin:    General: Skin is warm and dry.  Neurological:     General: No focal deficit present.      Mental Status: He is alert and oriented to person, place, and time.  Psychiatric:        Mood and Affect: Mood normal.        Behavior: Behavior normal.     ED Results / Procedures / Treatments   Labs (all labs ordered are listed, but only abnormal results are displayed) Labs Reviewed  CBC WITH DIFFERENTIAL/PLATELET - Abnormal; Notable for the following components:      Result Value   WBC 13.2 (*)    Lymphs Abs 5.3 (*)    All other components within normal limits  BASIC METABOLIC PANEL - Abnormal; Notable for the following components:   Glucose, Bld 102 (*)    Calcium 8.8 (*)    All other components within normal limits    EKG None  Radiology CT PELVIS W CONTRAST  Result Date: 09/25/2022 CLINICAL DATA:  Soft tissue infection suspected, pelvis. Peroneal wound with pain. Testicular pain with swelling. Wound behind scrotum for 3-4 months. History of Hodgkin's lymphoma. EXAM: CT PELVIS WITH CONTRAST TECHNIQUE: Multidetector CT imaging of the pelvis was performed using the standard protocol following the bolus administration of intravenous contrast. RADIATION DOSE REDUCTION: This exam was performed according to the departmental dose-optimization program which includes automated exposure control, adjustment of the mA and/or kV according to patient size and/or use of iterative reconstruction technique. CONTRAST:  134m OMNIPAQUE IOHEXOL 300 MG/ML  SOLN COMPARISON:  06/15/2019. FINDINGS: Urinary Tract:  No abnormality visualized. Bowel:  Unremarkable visualized pelvic bowel loops. Vascular/Lymphatic: Prominent lymph nodes along the external iliac arteries bilaterally measuring up to 1.6 cm in short axis diameter on the right. A few prominent lymph nodes are noted in the inguinal regions bilaterally, greater on the right than on the left. Reproductive:  The prostate gland is normal in size. Other: No abdominopelvic ascites. Mild subcutaneous fat stranding in posterior aspect of the scrotum. No  abscess is seen. No subcutaneous emphysema. Musculoskeletal: Lumbar spinal fusion hardware at L4 and L5. Degenerative changes in the lumbar spine. No acute osseous abnormality. IMPRESSION: 1. Mild subcutaneous fat stranding along the posterior aspect of the scrotum. No subcutaneous emphysema or abscess. 2. Prominent lymph nodes in the inguinal regions bilaterally and along the iliac chains bilaterally, which may be related to patient's history of Hodgkin's lymphoma. Electronically Signed   By: LBrett FairyM.D.   On: 09/25/2022 22:13    Procedures Procedures    Medications Ordered in ED Medications  sulfamethoxazole-trimethoprim (BACTRIM DS) 800-160 MG per tablet 1 tablet (has no administration in time range)  ketorolac (TORADOL) 15 MG/ML injection 15 mg (15 mg Intravenous Given 09/25/22 2111)  iohexol (OMNIPAQUE) 300 MG/ML solution  100 mL (100 mLs Intravenous Contrast Given 09/25/22 2152)    ED Course/ Medical Decision Making/ A&P Clinical Course as of 09/25/22 2238  Sun Sep 25, 2022  2220 Concern for fat stranding of scrotum of CT without evidence of necrotizing infection and no signs of sepsis. Patient will be treated for cellulitis and given outpatient urology follow up. [VK]    Clinical Course User Index [VK] Kemper Durie, DO                           Medical Decision Making This patient presents to the ED with chief complaint(s) of scrotal pain with pertinent past medical history of CAD s/p stent placement and Hodgkin's lymphoma in remission which further complicates the presenting complaint. The complaint involves an extensive differential diagnosis and also carries with it a high risk of complications and morbidity.    The differential diagnosis includes cellulitis, abscess, testicular torsion unlikely as he has no testicular tenderness on exam, considering epididymitis or orchitis, foreign years gangrene unlikely as patient is nonseptic and has no crepitus  Additional  history obtained: Additional history obtained from spouse Records reviewed N/A  ED Course and Reassessment: Due to patient's location of wound and tenderness to palpation, CT pelvis will be performed to evaluate for cellulitis, abscess or other deep space infection, he will be given Toradol for pain control.  He denies any concern for STD and declines STD testing at this time.  Independent labs interpretation:  The following labs were independently interpreted: Within normal range  Independent visualization of imaging: - I independently visualized the following imaging with scope of interpretation limited to determining acute life threatening conditions related to emergency care: CT pelvis, which revealed stranding of the posterior scrotum without evidence of abscess or subcutaneous air  Consultation: - Consulted or discussed management/test interpretation w/ external professional: N/A  Consideration for admission or further workup: Patient has no emergent conditions requiring admission or further work-up at this time and is stable for discharge home with urology follow-up  Social Determinants of health: N/A    Amount and/or Complexity of Data Reviewed Labs: ordered. Radiology: ordered.  Risk Prescription drug management.          Final Clinical Impression(s) / ED Diagnoses Final diagnoses:  Wound cellulitis  Cellulitis of scrotum    Rx / DC Orders ED Discharge Orders          Ordered    sulfamethoxazole-trimethoprim (BACTRIM DS) 800-160 MG tablet  2 times daily        09/25/22 2238              Kemper Durie, DO 09/25/22 2238

## 2022-09-29 ENCOUNTER — Inpatient Hospital Stay: Payer: Medicaid Other | Attending: Hematology

## 2022-09-29 ENCOUNTER — Encounter (HOSPITAL_COMMUNITY)
Admission: RE | Admit: 2022-09-29 | Discharge: 2022-09-29 | Disposition: A | Discharge status: Home / Self Care | Payer: Medicaid Other | Source: Ambulatory Visit | Attending: Hematology | Admitting: Hematology

## 2022-09-29 DIAGNOSIS — C8108 Nodular lymphocyte predominant Hodgkin lymphoma, lymph nodes of multiple sites: Secondary | ICD-10-CM

## 2022-09-29 DIAGNOSIS — Z08 Encounter for follow-up examination after completed treatment for malignant neoplasm: Secondary | ICD-10-CM | POA: Insufficient documentation

## 2022-09-29 DIAGNOSIS — C8590 Non-Hodgkin lymphoma, unspecified, unspecified site: Secondary | ICD-10-CM | POA: Diagnosis not present

## 2022-09-29 DIAGNOSIS — Z8571 Personal history of Hodgkin lymphoma: Secondary | ICD-10-CM | POA: Insufficient documentation

## 2022-09-29 LAB — COMPREHENSIVE METABOLIC PANEL
ALT: 30 U/L (ref 0–44)
AST: 25 U/L (ref 15–41)
Albumin: 4 g/dL (ref 3.5–5.0)
Alkaline Phosphatase: 102 U/L (ref 38–126)
Anion gap: 5 (ref 5–15)
BUN: 8 mg/dL (ref 6–20)
CO2: 23 mmol/L (ref 22–32)
Calcium: 9.2 mg/dL (ref 8.9–10.3)
Chloride: 108 mmol/L (ref 98–111)
Creatinine, Ser: 0.89 mg/dL (ref 0.61–1.24)
GFR, Estimated: >60 mL/min (ref >60)
Glucose, Bld: 109 mg/dL — ABNORMAL HIGH (ref 70–99)
Potassium: 4.3 mmol/L (ref 3.5–5.1)
Sodium: 136 mmol/L (ref 135–145)
Total Bilirubin: 0.6 mg/dL (ref 0.3–1.2)
Total Protein: 7.3 g/dL (ref 6.5–8.1)

## 2022-09-29 LAB — CBC WITH DIFFERENTIAL/PLATELET
Abs Immature Granulocytes: 0.03 10*3/uL (ref 0.00–0.07)
Basophils Absolute: 0.1 10*3/uL (ref 0.0–0.1)
Basophils Relative: 1 %
Eosinophils Absolute: 0.4 10*3/uL (ref 0.0–0.5)
Eosinophils Relative: 3 %
HCT: 46.7 % (ref 39.0–52.0)
Hemoglobin: 15.8 g/dL (ref 13.0–17.0)
Immature Granulocytes: 0 %
Lymphocytes Relative: 35 %
Lymphs Abs: 3.8 10*3/uL (ref 0.7–4.0)
MCH: 33.1 pg (ref 26.0–34.0)
MCHC: 33.8 g/dL (ref 30.0–36.0)
MCV: 97.7 fL (ref 80.0–100.0)
Monocytes Absolute: 0.8 10*3/uL (ref 0.1–1.0)
Monocytes Relative: 7 %
Neutro Abs: 6 10*3/uL (ref 1.7–7.7)
Neutrophils Relative %: 54 %
Platelets: 334 10*3/uL (ref 150–400)
RBC: 4.78 MIL/uL (ref 4.22–5.81)
RDW: 13.8 % (ref 11.5–15.5)
WBC: 11 10*3/uL — ABNORMAL HIGH (ref 4.0–10.5)
nRBC: 0 % (ref 0.0–0.2)

## 2022-09-29 LAB — LACTATE DEHYDROGENASE: LDH: 97 U/L — ABNORMAL LOW (ref 98–192)

## 2022-09-29 LAB — SEDIMENTATION RATE: Sed Rate: 6 mm/hr (ref 0–16)

## 2022-09-29 MED ORDER — FLUDEOXYGLUCOSE F - 18 (FDG) INJECTION
14.2600 | Freq: Once | INTRAVENOUS | Status: AC | PRN
  Administered 2022-09-29: 14.26 via INTRAVENOUS

## 2022-10-06 ENCOUNTER — Inpatient Hospital Stay (HOSPITAL_BASED_OUTPATIENT_CLINIC_OR_DEPARTMENT_OTHER): Payer: Medicaid Other | Admitting: Hematology

## 2022-10-06 VITALS — BP 133/80 | HR 84 | Temp 98.2°F | Resp 17 | Ht 71.0 in | Wt 271.0 lb

## 2022-10-06 DIAGNOSIS — Z8571 Personal history of Hodgkin lymphoma: Secondary | ICD-10-CM | POA: Diagnosis not present

## 2022-10-06 DIAGNOSIS — C8108 Nodular lymphocyte predominant Hodgkin lymphoma, lymph nodes of multiple sites: Secondary | ICD-10-CM

## 2022-10-06 DIAGNOSIS — Z08 Encounter for follow-up examination after completed treatment for malignant neoplasm: Secondary | ICD-10-CM | POA: Diagnosis not present

## 2022-10-06 NOTE — Patient Instructions (Signed)
[facility]  Discharge Instructions  You were seen and examined today by Dr. Delton Coombes.  Dr. Delton Coombes discussed your most recent lab work and CT scan which revealed that everything looks good and stable.  Follow-up as scheduled in 6 months with labs and scan.    Thank you for choosing [facility] to provide your oncology and hematology care.   To afford each patient quality time with our provider, please arrive at least 15 minutes before your scheduled appointment time. You may need to reschedule your appointment if you arrive late (10 or more minutes). Arriving late affects you and other patients whose appointments are after yours.  Also, if you miss three or more appointments without notifying the office, you may be dismissed from the clinic at the provider's discretion.    Again, thank you for choosing [facility].  Our hope is that these requests will decrease the amount of time that you wait before being seen by our physicians.   If you have a lab appointment with the [facility] please come in thru the Main Entrance and check in at the main information desk.           _____________________________________________________________  Should you have questions after your visit to [facility], please contact our office at [phone] and follow the prompts.  Our office hours are 8:00 a.m. to 4:30 p.m. Monday - Thursday and 8:00 a.m. to 2:30 p.m. Friday.  Please note that voicemails left after 4:00 p.m. may not be returned until the following business day.  We are closed weekends and all major holidays.  You do have access to a nurse 24-7, just call the main number to the clinic [phone] and do not press any options, hold on the line and a nurse will answer the phone.    For prescription refill requests, have your pharmacy contact our office and allow 72 hours.    Masks are optional in the cancer  centers. If you would like for your care team to wear a mask while they are taking care of you, please let them know. You may have one support person who is at least 39 years old accompany you for your appointments.

## 2022-10-06 NOTE — Progress Notes (Signed)
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 ([place])   09/18/2019 Cancer Staging   Staging form: Hodgkin and Non-Hodgkin Lymphoma, AJCC 8th Edition - Clinical stage from 09/18/2019: Stage III (Hodgkin lymphoma) - Signed by Derek Jack, MD on 09/18/2019   10/22/2019 - 02/06/2020 Chemotherapy   Patient is on Treatment Plan : NON-HODGKINS LYMPHOMA R-CHOP q21d       CANCER STAGING:  Cancer Staging  Hodgkin lymphoma [facility]) Staging form: Hodgkin and Non-Hodgkin Lymphoma, AJCC 8th Edition - Clinical stage from 09/18/2019: Stage III (Hodgkin lymphoma) - Signed by Derek Jack, MD on 09/18/2019   INTERVAL HISTORY:  Nathan Waters, a 41 y.o. male, seen for follow-up of Hodgkin's lymphoma.  Reports energy levels of 100%.  Denies any fevers, night sweats or weight loss.  REVIEW OF SYSTEMS:  Review of Systems  Constitutional:  Negative for appetite change, fatigue, fever and unexpected weight change.  Endocrine: Negative for hot flashes.  Musculoskeletal:  Positive for back pain (Chronic back pain stable).  All other systems reviewed and are negative.   PAST MEDICAL/SURGICAL HISTORY:  Past Medical History:  Diagnosis Date   Asthma    Cancer ([place])    stage 3 hodgkins lymphoma   Coronary atherosclerosis of native coronary artery    BMS to mid circumflex 11/2011, LVEF 55-60%   Essential hypertension    Migraine    Mixed hyperlipidemia    STEMI (ST elevation myocardial infarction) ([place])    Complicated by VF arrest 11/2011   Past Surgical  History:  Procedure Laterality Date   AXILLARY LYMPH NODE BIOPSY Left 07/26/2019   Procedure: AXILLARY LYMPH NODE BIOPSY;  Surgeon: Aviva Signs, MD;  Location: [facility];  Service: General;  Laterality: Left;   BACK SURGERY     CARDIAC CATHETERIZATION     CHOLECYSTECTOMY N/A 08/26/2019   Procedure: LAPAROSCOPIC CHOLECYSTECTOMY;  Surgeon: Aviva Signs, MD;  Location: [facility];  Service: General;  Laterality: N/A;   INGUINAL LYMPH NODE BIOPSY Right 05/26/2021   Procedure: INGUINAL LYMPH NODE BIOPSY;  Surgeon: Aviva Signs, MD;  Location: [facility];  Service: General;  Laterality: Right;   LEFT HEART CATHETERIZATION WITH CORONARY ANGIOGRAM N/A 11/21/2011   Procedure: LEFT HEART CATHETERIZATION WITH CORONARY ANGIOGRAM;  Surgeon: Peter M Martinique, MD;  Location: [facility] CATH LAB;  Service: Cardiovascular;  Laterality: N/A;   LYMPH NODE BIOPSY Right 08/26/2019   Procedure: LYMPH NODE BIOPSY, INGUINAL;  Surgeon: Aviva Signs, MD;  Location: [facility];  Service: General;  Laterality: Right;   PERCUTANEOUS CORONARY STENT INTERVENTION (PCI-S) N/A 11/21/2011   Procedure: PERCUTANEOUS CORONARY STENT INTERVENTION (PCI-S);  Surgeon: Peter M Martinique, MD;  Location: [facility] CATH LAB;  Service: Cardiovascular;  Laterality: N/A;   PORT-A-CATH REMOVAL N/A 12/27/2021   Procedure: MINOR REMOVAL PORT-A-CATH;  Surgeon: Aviva Signs, MD;  Location: [facility];  Service: General;  Laterality: N/A;   PORTACATH PLACEMENT Left 10/01/2019   Procedure: INSERTION PORT-A-CATH (catheter attached left subclavian);  Surgeon: Aviva Signs, MD;  Location: [facility];  Service: General;  Laterality: Left;   SPLENECTOMY  SOCIAL HISTORY:  Social History   Socioeconomic History   Marital status: Divorced    Spouse name: Not on file   Number of children: 2   Years of education: Not on file   Highest education level: Not on file  Occupational History   Occupation: not employed  Tobacco Use   Smoking status: Every Day    Packs/day: 1.00    Types:  Cigarettes   Smokeless tobacco: Never  Vaping Use   Vaping Use: Former   Start date: 04/27/2012  Substance and Sexual Activity   Alcohol use: Yes    Comment: Occasionally   Drug use: No   Sexual activity: Yes    Birth control/protection: Rhythm  Other Topics Concern   Not on file  Social History Narrative   Not on file   Social Determinants of Health   Financial Resource Strain: Low Risk  (09/23/2020)   Overall Financial Resource Strain (CARDIA)    Difficulty of Paying Living Expenses: Not hard at all  Food Insecurity: No Food Insecurity (09/23/2020)   Hunger Vital Sign    Worried About Running Out of Food in the Last Year: Never true    [place] in the Last Year: Never true  Transportation Needs: No Transportation Needs (09/23/2020)   PRAPARE - Hydrologist (Medical): No    Lack of Transportation (Non-Medical): No  Physical Activity: Unknown (06/27/2019)   Exercise Vital Sign    Days of Exercise per Week: 3 days    Minutes of Exercise per Session: Not on file  Recent Concern: Physical Activity - Insufficiently Active (06/15/2019)   Exercise Vital Sign    Days of Exercise per Week: 2 days    Minutes of Exercise per Session: 30 min  Stress: No Stress Concern Present (09/23/2020)   [place]    Feeling of Stress : Not at all  Social Connections: Socially Isolated (09/23/2020)   Social Connection and Isolation Panel [NHANES]    Frequency of Communication with Friends and Family: More than three times a week    Frequency of Social Gatherings with Friends and Family: Three times a week    Attends Religious Services: Never    Active Member of Clubs or Organizations: No    Attends Archivist Meetings: Never    Marital Status: Divorced  Human resources officer Violence: Not At Risk (09/23/2020)   Humiliation, Afraid, Rape, and Kick questionnaire    Fear of Current or Ex-Partner:  No    Emotionally Abused: No    Physically Abused: No    Sexually Abused: No    FAMILY HISTORY:  Family History  Problem Relation Age of Onset   Malignant hyperthermia Mother    Cancer Mother    Malignant hyperthermia Brother    Heart disease Brother    Asthma Brother    COPD Brother    Heart disease Father    Cancer Father    ADD / ADHD Daughter    ADD / ADHD Son     CURRENT MEDICATIONS:  Current Outpatient Medications  Medication Sig Dispense Refill   albuterol (PROVENTIL HFA;VENTOLIN HFA) 108 (90 Base) MCG/ACT inhaler Inhale 2 puffs into the lungs every 6 (six) hours as needed for wheezing or shortness of breath.     diphenhydrAMINE (BENADRYL) 25 MG tablet Take 25 mg by mouth daily as needed (seasonal allergies).     ibuprofen (ADVIL) 200 MG tablet Take 800  mg by mouth every 8 (eight) hours as needed (headache).     lidocaine-prilocaine (EMLA) cream Apply 1 application. topically once. Prior to procedure.     methocarbamol (ROBAXIN) 500 MG tablet Take 1 tablet (500 mg total) by mouth 2 (two) times daily. 20 tablet 0   methylPREDNISolone (MEDROL DOSEPAK) 4 MG TBPK tablet Take as directed 21 tablet 0   naproxen (NAPROSYN) 500 MG tablet Take 1 tablet (500 mg total) by mouth 2 (two) times daily. 30 tablet 0   No current facility-administered medications for this visit.    ALLERGIES:  Allergies  Allergen Reactions   Penicillins Anaphylaxis    Did it involve swelling of the face/tongue/throat, SOB, or low BP? Yes Did it involve sudden or severe rash/hives, skin peeling, or any reaction on the inside of your mouth or nose? No Did you need to seek medical attention at a hospital or doctor's office? Yes When did it last happen?  Childhood allergy     If all above answers are "NO", may proceed with cephalosporin use.     PHYSICAL EXAM:  Performance status (ECOG): 0 - Asymptomatic  Vitals:   10/06/22 1436  BP: 133/80  Pulse: 84  Resp: 17  Temp: 98.2 F (36.8 C)  SpO2:  97%   Wt Readings from Last 3 Encounters:  10/06/22 271 lb (122.9 kg)  09/25/22 273 lb (123.8 kg)  06/09/22 273 lb 5.9 oz (124 kg)   Physical Exam Vitals reviewed.  Constitutional:      Appearance: Normal appearance. He is obese.  Cardiovascular:     Rate and Rhythm: Normal rate and regular rhythm.     Pulses: Normal pulses.     Heart sounds: Normal heart sounds.  Pulmonary:     Effort: Pulmonary effort is normal.     Breath sounds: Normal breath sounds.  Lymphadenopathy:     Upper Body:     Right upper body: No supraclavicular or axillary adenopathy.     Left upper body: No supraclavicular or axillary adenopathy.  Neurological:     General: No focal deficit present.     Mental Status: He is alert and oriented to person, place, and time.  Psychiatric:        Mood and Affect: Mood normal.        Behavior: Behavior normal.      LABORATORY DATA:  I have reviewed the labs as listed.     Latest Ref Rng & Units 09/29/2022   10:51 AM 09/25/2022    9:13 PM 06/02/2022   12:06 PM  CBC  WBC 4.0 - 10.5 K/uL 11.0  13.2  14.5   Hemoglobin 13.0 - 17.0 g/dL 15.8  14.9  15.8   Hematocrit 39.0 - 52.0 % 46.7  43.2  46.0   Platelets 150 - 400 K/uL 334  321  328       Latest Ref Rng & Units 09/29/2022   10:51 AM 09/25/2022    9:13 PM 06/02/2022   12:19 PM  CMP  Glucose 70 - 99 mg/dL 109  102  104   BUN 6 - 20 mg/dL '8  7  6   '$ Creatinine 0.61 - 1.24 mg/dL 0.89  0.67  0.85   Sodium 135 - 145 mmol/L 136  138  140   Potassium 3.5 - 5.1 mmol/L 4.3  3.7  3.9   Chloride 98 - 111 mmol/L 108  107  110   CO2 22 - 32 mmol/L 23  23  25  Calcium 8.9 - 10.3 mg/dL 9.2  8.8  9.2   Total Protein 6.5 - 8.1 g/dL 7.3   7.1   Total Bilirubin 0.3 - 1.2 mg/dL 0.6   0.8   Alkaline Phos 38 - 126 U/L 102   105   AST 15 - 41 U/L 25   25   ALT 0 - 44 U/L 30   33     DIAGNOSTIC IMAGING:  I have independently reviewed the scans and discussed with the patient. NM PET Image Restag (PS) Skull Base To  Thigh  Result Date: 09/30/2022 CLINICAL DATA:  Subsequent treatment strategy for lymphoma. EXAM: NUCLEAR MEDICINE PET SKULL BASE TO THIGH TECHNIQUE: 14.3 mCi F-18 FDG was injected intravenously. Full-ring PET imaging was performed from the skull base to thigh after the radiotracer. CT data was obtained and used for attenuation correction and anatomic localization. Fasting blood glucose: 121 mg/dl COMPARISON:  CT pelvis 09/25/2022 and PET 06/02/2022. FINDINGS: Mediastinal blood pool activity: SUV max 2.9 Liver activity: SUV max 3.8 NECK: Small hypermetabolic lymph nodes in the neck, as before. Index level 2-3 lymph node measures 5 mm (3/47), SUV max 3.5, stable. Incidental CT findings: None. CHEST: Similar small mildly hypermetabolic left axillary lymph nodes. Index left axillary lymph node measures 1.4 cm (3/88), SUV max 3.1. No additional abnormal hypermetabolism. Incidental CT findings: Heart size normal. No pericardial or pleural effusion. Mild paraseptal emphysema. Subsegmental volume loss in the right middle lobe and lingula. ABDOMEN/PELVIS: Small mildly hypermetabolic abdominal peritoneal ligament and upper retroperitoneal lymph nodes,, similar to minimally decreased in FDG uptake. Index periportal lymph node measures 1.5 cm (3/156), SUV max 3.9, previously 5.1. Pelvic retroperitoneal and inguinal lymph nodes are similar to decreased in hypermetabolism. Index right external iliac lymph node measures 1.8 cm (3/262), SUV max 2.0, compared to 3.2 previously. Index right inguinal lymph node measures 12 mm (3/263), SUV max 5.5 similar to minimally increased from prior. No abnormal hypermetabolism in the liver, adrenal glands or pancreas. Spleen is absent. Incidental CT findings: Liver, adrenal glands and right kidney are unremarkable. Tiny stone in the left kidney. Splenectomy. Pancreas, stomach and bowel are otherwise grossly unremarkable. Cholecystectomy. SKELETON: No abnormal hypermetabolism. Incidental CT  findings: Postoperative changes in the lumbar spine. IMPRESSION: 1. Similar to minimally decreased hypermetabolism involving lymph nodes in the neck, left axilla, abdomen and pelvis (Deauville 3-4). 2. Tiny left renal stone. 3.  Emphysema (ICD10-J43.9). Electronically Signed   By: Lorin Picket M.D.   On: 09/30/2022 13:44   CT PELVIS W CONTRAST  Result Date: 09/25/2022 CLINICAL DATA:  Soft tissue infection suspected, pelvis. Peroneal wound with pain. Testicular pain with swelling. Wound behind scrotum for 3-4 months. History of Hodgkin's lymphoma. EXAM: CT PELVIS WITH CONTRAST TECHNIQUE: Multidetector CT imaging of the pelvis was performed using the standard protocol following the bolus administration of intravenous contrast. RADIATION DOSE REDUCTION: This exam was performed according to the departmental dose-optimization program which includes automated exposure control, adjustment of the mA and/or kV according to patient size and/or use of iterative reconstruction technique. CONTRAST:  172m OMNIPAQUE IOHEXOL 300 MG/ML  SOLN COMPARISON:  06/15/2019. FINDINGS: Urinary Tract:  No abnormality visualized. Bowel:  Unremarkable visualized pelvic bowel loops. Vascular/Lymphatic: Prominent lymph nodes along the external iliac arteries bilaterally measuring up to 1.6 cm in short axis diameter on the right. A few prominent lymph nodes are noted in the inguinal regions bilaterally, greater on the right than on the left. Reproductive:  The prostate gland is normal in size.  Other: No abdominopelvic ascites. Mild subcutaneous fat stranding in posterior aspect of the scrotum. No abscess is seen. No subcutaneous emphysema. Musculoskeletal: Lumbar spinal fusion hardware at L4 and L5. Degenerative changes in the lumbar spine. No acute osseous abnormality. IMPRESSION: 1. Mild subcutaneous fat stranding along the posterior aspect of the scrotum. No subcutaneous emphysema or abscess. 2. Prominent lymph nodes in the inguinal  regions bilaterally and along the iliac chains bilaterally, which may be related to patient's history of Hodgkin's lymphoma. Electronically Signed   By: Brett Fairy M.D.   On: 09/25/2022 22:13     ASSESSMENT:  1.  NLP Hodgkin's disease: -6 cycles of R-CHOP from 10/23/2019 through 02/04/2020. -PET scan on 03/25/2020 showed most of the lymph nodes with Deauville 2 activity with normal size limits.  2 of the right external iliac lymph nodes remain mildly enlarged.  Right thigh mass also normalized.   PLAN:  1.  NLP Hodgkin's disease: - PET scan on 06/02/2022 showed slight increase in the right inguinal lymph node measuring 1.2 cm, previously 0.6 cm with increased SUV. -Since last visit, he denied any B symptoms.  However he is having some problems in the perineum and is seeing Dr. Alyson Ingles soon. - Reviewed labs from 09/29/2022 which showed normal LFTs and LDH.  CBC was grossly normal with mild leukocytosis from lack of spleen. - PET scan (09/29/2022): Similar to minimally decreased hypermetabolism involving the lymph nodes in the neck, left axilla, abdomen and pelvis.  Right inguinal lymph node measures 1.2 cm with SUV 5.5, similar to minimally increased from prior. - I have recommended follow-up in 6 months with repeat labs and scan.    Orders placed this encounter:  Orders Placed This Encounter  Procedures   NM PET Image Restag (PS) Skull Base To Thigh   CBC with Differential/Platelet   Comprehensive metabolic panel   Lactate dehydrogenase   Sedimentation rate      Derek Jack, MD [place] [phone]

## 2022-10-24 ENCOUNTER — Ambulatory Visit: Payer: Medicaid Other | Admitting: Urology

## 2022-10-24 VITALS — BP 130/87 | HR 90

## 2022-10-24 DIAGNOSIS — K604 Rectal fistula: Secondary | ICD-10-CM | POA: Diagnosis not present

## 2022-10-24 DIAGNOSIS — N509 Disorder of male genital organs, unspecified: Secondary | ICD-10-CM

## 2022-10-24 MED ORDER — CIPROFLOXACIN HCL 500 MG PO TABS
500.0000 mg | ORAL_TABLET | Freq: Once | ORAL | Status: AC
  Administered 2022-10-24: 500 mg via ORAL

## 2022-10-24 NOTE — Progress Notes (Signed)
10/24/2022 2:12 PM   Nathan Waters 1981-06-08 [id number]  Referring provider: No referring provider defined for this encounter.  Perineal wound   HPI: Nathan Waters is a 42yo here for evaluation of a perineal wound. He noted a scrotal lesion 5 months ago that has been intermittently draining. He went to the ER 1 month ago when his scrotum began to swell. He was placed on 1 week of antibiotics which improved the swelling. Currently he has a perineal wound that is draining purulent drainage. The drainage is worse after a bowel movement. He denies any significant LUTS. No dysuria. UA normal   PMH: Past Medical History:  Diagnosis Date   Asthma    Cancer ([place])    stage 3 hodgkins lymphoma   Coronary atherosclerosis of native coronary artery    BMS to mid circumflex 11/2011, LVEF 55-60%   Essential hypertension    Migraine    Mixed hyperlipidemia    STEMI (ST elevation myocardial infarction) ([place])    Complicated by VF arrest 11/2011    Surgical History: Past Surgical History:  Procedure Laterality Date   AXILLARY LYMPH NODE BIOPSY Left 07/26/2019   Procedure: AXILLARY LYMPH NODE BIOPSY;  Surgeon: Aviva Signs, MD;  Location: [facility];  Service: General;  Laterality: Left;   BACK SURGERY     CARDIAC CATHETERIZATION     CHOLECYSTECTOMY N/A 08/26/2019   Procedure: LAPAROSCOPIC CHOLECYSTECTOMY;  Surgeon: Aviva Signs, MD;  Location: [facility];  Service: General;  Laterality: N/A;   INGUINAL LYMPH NODE BIOPSY Right 05/26/2021   Procedure: INGUINAL LYMPH NODE BIOPSY;  Surgeon: Aviva Signs, MD;  Location: [facility];  Service: General;  Laterality: Right;   LEFT HEART CATHETERIZATION WITH CORONARY ANGIOGRAM N/A 11/21/2011   Procedure: LEFT HEART CATHETERIZATION WITH CORONARY ANGIOGRAM;  Surgeon: Peter M Martinique, MD;  Location: [facility] CATH LAB;  Service: Cardiovascular;  Laterality: N/A;   LYMPH NODE BIOPSY Right 08/26/2019   Procedure: LYMPH NODE BIOPSY, INGUINAL;  Surgeon: Aviva Signs, MD;   Location: [facility];  Service: General;  Laterality: Right;   PERCUTANEOUS CORONARY STENT INTERVENTION (PCI-S) N/A 11/21/2011   Procedure: PERCUTANEOUS CORONARY STENT INTERVENTION (PCI-S);  Surgeon: Peter M Martinique, MD;  Location[facility] CATH LAB;  Service: Cardiovascular;  Laterality: N/A;   PORT-A-CATH REMOVAL N/A 12/27/2021   Procedure: MINOR REMOVAL PORT-A-CATH;  Surgeon: Aviva Signs, MD;  Location: [facility];  Service: General;  Laterality: N/A;   PORTACATH PLACEMENT Left 10/01/2019   Procedure: INSERTION PORT-A-CATH (catheter attached left subclavian);  Surgeon: Aviva Signs, MD;  Location: [facility];  Service: General;  Laterality: Left;   SPLENECTOMY      Home Medications:  Allergies as of 10/24/2022       Reactions   Penicillins Anaphylaxis   Did it involve swelling of the face/tongue/throat, SOB, or low BP? Yes Did it involve sudden or severe rash/hives, skin peeling, or any reaction on the inside of your mouth or nose? No Did you need to seek medical attention at a hospital or doctor's office? Yes When did it last happen?  Childhood allergy     If all above answers are "NO", may proceed with cephalosporin use.        Medication List        Accurate as of October 24, 2022  2:12 PM. If you have any questions, ask your nurse or doctor.          albuterol 108 (90 Base) MCG/ACT inhaler Commonly known as: VENTOLIN HFA Inhale 2 puffs  into the lungs every 6 (six) hours as needed for wheezing or shortness of breath.   diphenhydrAMINE 25 MG tablet Commonly known as: BENADRYL Take 25 mg by mouth daily as needed (seasonal allergies).   ibuprofen 200 MG tablet Commonly known as: ADVIL Take 800 mg by mouth every 8 (eight) hours as needed (headache).   lidocaine-prilocaine cream Commonly known as: EMLA Apply 1 application. topically once. Prior to procedure.   methocarbamol 500 MG tablet Commonly known as: ROBAXIN Take 1 tablet (500 mg total) by mouth 2 (two) times daily.    methylPREDNISolone 4 MG Tbpk tablet Commonly known as: MEDROL DOSEPAK Take as directed   naproxen 500 MG tablet Commonly known as: NAPROSYN Take 1 tablet (500 mg total) by mouth 2 (two) times daily.        Allergies:  Allergies  Allergen Reactions   Penicillins Anaphylaxis    Did it involve swelling of the face/tongue/throat, SOB, or low BP? Yes Did it involve sudden or severe rash/hives, skin peeling, or any reaction on the inside of your mouth or nose? No Did you need to seek medical attention at a hospital or doctor's office? Yes When did it last happen?  Childhood allergy     If all above answers are "NO", may proceed with cephalosporin use.     Family History: Family History  Problem Relation Age of Onset   Malignant hyperthermia Mother    Cancer Mother    Malignant hyperthermia Brother    Heart disease Brother    Asthma Brother    COPD Brother    Heart disease Father    Cancer Father    ADD / ADHD Daughter    ADD / ADHD Son     Social History:  reports that he has been smoking cigarettes. He has been smoking an average of 1 pack per day. He has never used smokeless tobacco. He reports current alcohol use. He reports that he does not use drugs.  ROS: All other review of systems were reviewed and are negative except what is noted above in HPI  Physical Exam: BP 130/87   Pulse 90   Constitutional:  Alert and oriented, No acute distress. HEENT: [place] AT, moist mucus membranes.  Trachea midline, no masses. Cardiovascular: No clubbing, cyanosis, or edema. Respiratory: Normal respiratory effort, no increased work of breathing. GI: Abdomen is soft, nontender, nondistended, no abdominal masses GU: No CVA tenderness. Circumcised phallus. No masses/lesions on penis, testis, scrotum. 1cm perineal wound with purulent drainag Lymph: No cervical or inguinal lymphadenopathy. Skin: No rashes, bruises or suspicious lesions. Neurologic: Grossly intact, no focal deficits,  moving all 4 extremities. Psychiatric: Normal mood and affect.  Laboratory Data: Lab Results  Component Value Date   WBC 11.0 (H) 09/29/2022   HGB 15.8 09/29/2022   HCT 46.7 09/29/2022   MCV 97.7 09/29/2022   PLT 334 09/29/2022    Lab Results  Component Value Date   CREATININE 0.89 09/29/2022    No results found for: "PSA"  No results found for: "TESTOSTERONE"  Lab Results  Component Value Date   HGBA1C 5.7 (H) 11/21/2011    Urinalysis    Component Value Date/Time   COLORURINE YELLOW 06/14/2019 1800   APPEARANCEUR CLEAR 06/14/2019 1800   LABSPEC 1.025 06/14/2019 1800   PHURINE 6.0 06/14/2019 1800   GLUCOSEU NEGATIVE 06/14/2019 1800   HGBUR NEGATIVE 06/14/2019 1800   BILIRUBINUR NEGATIVE 06/14/2019 1800   KETONESUR 20 (A) 06/14/2019 1800   PROTEINUR NEGATIVE 06/14/2019 1800  UROBILINOGEN 1.0 11/21/2011 1125   NITRITE NEGATIVE 06/14/2019 1800   LEUKOCYTESUR NEGATIVE 06/14/2019 1800    Lab Results  Component Value Date   BACTERIA RARE 06/16/2010    Pertinent Imaging:  No results found for this or any previous visit.  No results found for this or any previous visit.  No results found for this or any previous visit.  No results found for this or any previous visit.  No results found for this or any previous visit.  No valid procedures specified. No results found for this or any previous visit.  No results found for this or any previous visit.   Assessment & Plan:    1. 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  Nicolette Bang, MD  [facility] [place]   Cystoscopy Procedure Note  Patient identification was confirmed, informed consent was obtained, and patient was prepped using Betadine solution.  Lidocaine jelly was administered per urethral meatus.     Pre-Procedure: - Inspection reveals a normal caliber ureteral meatus.  Procedure: The flexible cystoscope was introduced  without difficulty - No urethral strictures/lesions are present. - Normal prostate  - Normal bladder neck - Bilateral ureteral orifices identified - Bladder mucosa  reveals no ulcers, tumors, or lesions - No bladder stones - No trabeculation    Post-Procedure: - Patient tolerated the procedure well  Assessment/ Plan:   No follow-ups on file.  Nicolette Bang, MD

## 2022-10-25 ENCOUNTER — Emergency Department (HOSPITAL_COMMUNITY): Payer: Medicaid Other

## 2022-10-25 ENCOUNTER — Emergency Department (HOSPITAL_COMMUNITY)
Admission: EM | Admit: 2022-10-25 | Discharge: 2022-10-25 | Disposition: A | Discharge status: Home / Self Care | Payer: Medicaid Other | Attending: Emergency Medicine | Admitting: Emergency Medicine

## 2022-10-25 ENCOUNTER — Other Ambulatory Visit: Payer: Self-pay

## 2022-10-25 DIAGNOSIS — M47812 Spondylosis without myelopathy or radiculopathy, cervical region: Secondary | ICD-10-CM | POA: Diagnosis not present

## 2022-10-25 DIAGNOSIS — Y92410 Unspecified street and highway as the place of occurrence of the external cause: Secondary | ICD-10-CM | POA: Diagnosis not present

## 2022-10-25 DIAGNOSIS — Z8571 Personal history of Hodgkin lymphoma: Secondary | ICD-10-CM | POA: Diagnosis not present

## 2022-10-25 DIAGNOSIS — S199XXA Unspecified injury of neck, initial encounter: Secondary | ICD-10-CM | POA: Diagnosis not present

## 2022-10-25 DIAGNOSIS — S161XXA Strain of muscle, fascia and tendon at neck level, initial encounter: Secondary | ICD-10-CM | POA: Diagnosis not present

## 2022-10-25 LAB — URINALYSIS, ROUTINE W REFLEX MICROSCOPIC
Bilirubin, UA: NEGATIVE
Glucose, UA: NEGATIVE
Ketones, UA: NEGATIVE
Leukocytes,UA: NEGATIVE
Nitrite, UA: NEGATIVE
Protein,UA: NEGATIVE
RBC, UA: NEGATIVE
Specific Gravity, UA: 1.02 (ref 1.005–1.030)
Urobilinogen, Ur: 0.2 mg/dL (ref 0.2–1.0)
pH, UA: 5.5 (ref 5.0–7.5)

## 2022-10-25 MED ORDER — LIDOCAINE 5 % EX PTCH: 1.0000 | MEDICATED_PATCH | CUTANEOUS | 0 refills | Status: DC

## 2022-10-25 MED ORDER — HYDROCODONE-ACETAMINOPHEN 5-325 MG PO TABS
1.0000 | ORAL_TABLET | Freq: Once | ORAL | Status: AC
  Administered 2022-10-25: 1 via ORAL
  Filled 2022-10-25: qty 1

## 2022-10-25 MED ORDER — MELOXICAM 7.5 MG PO TABS: 7.5000 mg | ORAL_TABLET | Freq: Every day | ORAL | 0 refills | Status: AC

## 2022-10-25 MED ORDER — IBUPROFEN 400 MG PO TABS
600.0000 mg | ORAL_TABLET | Freq: Once | ORAL | Status: AC
  Administered 2022-10-25: 600 mg via ORAL
  Filled 2022-10-25: qty 2
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
## 2022-10-31 ENCOUNTER — Encounter: Payer: Self-pay | Admitting: Urology

## 2022-10-31 NOTE — Patient Instructions (Signed)
Anal Fistula  An anal fistula is a hole that develops between the bowel and the skin near the anus. The anus allows stool (feces) to leave the body. The anus has many tiny glands that make lubricating fluid. Sometimes, these glands become plugged and infected. This can cause a fluid-filled pocket (abscess) to form. An anal fistula often occurs when an abscess becomes infected and then develops into a hole between the bowel and the skin. What are the causes? In most cases, an anal fistula is caused by a past or current buildup of pus around the anus (anal abscess). Other causes include: A complication of surgery. Injury to the rectum or the area around it. Using high-energy beams (radiation) to treat the area around the rectum. What increases the risk? You are more likely to develop this condition if you have certain medical conditions or diseases, including: Chronic inflammatory bowel disease, such as Crohn's disease or ulcerative colitis. Colon cancer or rectal cancer. Diverticular disease, such as diverticulitis. A sexually transmitted infection, or STI, such as gonorrhea, chlamydia, or syphilis. An infection that is caused by HIV. What are the signs or symptoms? Symptoms of this condition include: Throbbing or constant pain that may be worse while you are sitting. Swelling or irritation around the anus. Pus or blood from an opening near the anus. Pain when passing stool. Fever or chills. How is this diagnosed? This condition is diagnosed based on: A physical exam. This may include: An exam to find the external opening of the fistula. An exam with a probe or scope to help locate the internal opening of the fistula. An exam of the rectum with a gloved hand (digital rectal exam). Imaging tests that use dye to find the exact location and path of the fistula. Tests may include: X-rays. Ultrasound. CT scan. MRI. Other tests to find the cause of the anal fistula. How is this  treated? This condition is most commonly treated with surgery. The type of surgery that is used will depend on where the fistula is located and how complex the fistula is. Surgery may include: A fistulotomy. The whole fistula is opened up, and the contents are drained to promote healing. Seton placement. A silk string (seton) is placed into the fistula during a fistulotomy. This helps to drain any infection and promote healing. Advancement flap procedure. Tissue is removed from your rectum or the skin around the anus and attached to the opening of the fistula. Bioprosthetic plug. A cone-shaped plug is made from your tissue and is used to block the opening of the fistula. Some anal fistulas do not require surgery. A nonsurgical treatment option involves injecting a fibrin glue to seal the fistula. You also may be prescribed an antibiotic medicine to treat any infection. Follow these instructions at home: Medicines Take over-the-counter and prescription medicines only as told by your health care provider. If you were prescribed an antibiotic medicine, take it as told by your health care provider. Do not stop taking the antibiotic even if you start to feel better. Use a stool softener or a laxative if told to do so by your health care provider. General instructions  Eat a high-fiber diet as told by your health care provider. This can help to prevent constipation. Drink enough fluid to keep your urine pale yellow. Take a warm sitz bath for 15-20 minutes, 3-4 times per day, or as told by your health care provider. Sitz baths can ease your pain and discomfort and help with healing. Follow  good hygiene to keep the anal area as clean and dry as possible. Use wet toilet paper or a moist towelette after each bowel movement. Keep all follow-up visits as told by your health care provider. This is important. Contact a health care provider if you have: Increased pain that is not controlled with medicines. New  redness or swelling around the anal area. New fluid, blood, or pus coming from the anal area. Tenderness or warmth around the anal area. Get help right away if you have: A fever. Severe pain. Chills or diarrhea. Severe problems urinating or having a bowel movement. Summary An anal fistula is a hole that develops between the bowel and the skin near the anus. This condition is most often caused by a buildup of pus around the anus (anal abscess). Other causes include a complication of surgery, an injury to the rectum, or the use of radiation to treat the rectal area. This condition is most commonly treated with surgery. Follow your health care provider's instructions about taking medicines, eating and drinking, or taking sitz baths. Call your health care provider if you have more pain, swelling, or blood. Get help right away if you have fever, severe pain, or problems passing urine or stool. This information is not intended to replace advice given to you by your health care provider. Make sure you discuss any questions you have with your health care provider. Document Revised: 04/21/2021 Document Reviewed: 04/21/2021 Elsevier Patient Education  [place].

## 2022-11-01 ENCOUNTER — Ambulatory Visit: Payer: Medicaid Other | Admitting: General Surgery

## 2022-11-01 ENCOUNTER — Encounter: Payer: Self-pay | Admitting: General Surgery

## 2022-11-01 VITALS — BP 123/80 | HR 80 | Temp 98.1°F | Resp 14 | Ht 71.0 in | Wt 276.0 lb

## 2022-11-01 DIAGNOSIS — N360 Urethral fistula: Secondary | ICD-10-CM | POA: Diagnosis not present
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
## 2022-11-10 ENCOUNTER — Other Ambulatory Visit: Payer: Self-pay

## 2022-11-10 ENCOUNTER — Encounter (HOSPITAL_COMMUNITY)
Admission: RE | Admit: 2022-11-10 | Discharge: 2022-11-10 | Disposition: A | Discharge status: Home / Self Care | Payer: Medicaid Other | Source: Ambulatory Visit | Attending: General Surgery | Admitting: General Surgery

## 2022-11-10 ENCOUNTER — Encounter (HOSPITAL_COMMUNITY): Payer: Self-pay

## 2022-11-14 ENCOUNTER — Ambulatory Visit (HOSPITAL_BASED_OUTPATIENT_CLINIC_OR_DEPARTMENT_OTHER): Payer: Medicaid Other | Admitting: Anesthesiology

## 2022-11-14 ENCOUNTER — Other Ambulatory Visit: Payer: Self-pay

## 2022-11-14 ENCOUNTER — Encounter (HOSPITAL_COMMUNITY): Payer: Self-pay | Admitting: General Surgery

## 2022-11-14 ENCOUNTER — Ambulatory Visit (HOSPITAL_COMMUNITY)
Admission: RE | Admit: 2022-11-14 | Discharge: 2022-11-14 | Disposition: A | Discharge status: Home / Self Care | Payer: Medicaid Other | Attending: General Surgery | Admitting: General Surgery

## 2022-11-14 ENCOUNTER — Ambulatory Visit (HOSPITAL_COMMUNITY): Payer: Medicaid Other | Admitting: Anesthesiology

## 2022-11-14 ENCOUNTER — Encounter (HOSPITAL_COMMUNITY)
Admission: RE | Disposition: A | Discharge status: Home / Self Care | Payer: Self-pay | Source: Home / Self Care | Attending: General Surgery

## 2022-11-14 DIAGNOSIS — I2510 Atherosclerotic heart disease of native coronary artery without angina pectoris: Secondary | ICD-10-CM | POA: Insufficient documentation

## 2022-11-14 DIAGNOSIS — I252 Old myocardial infarction: Secondary | ICD-10-CM

## 2022-11-14 DIAGNOSIS — N360 Urethral fistula: Secondary | ICD-10-CM | POA: Diagnosis not present

## 2022-11-14 DIAGNOSIS — K603 Anal fistula: Secondary | ICD-10-CM | POA: Diagnosis not present

## 2022-11-14 DIAGNOSIS — I10 Essential (primary) hypertension: Secondary | ICD-10-CM

## 2022-11-14 DIAGNOSIS — F17210 Nicotine dependence, cigarettes, uncomplicated: Secondary | ICD-10-CM | POA: Diagnosis not present

## 2022-11-14 DIAGNOSIS — C8190 Hodgkin lymphoma, unspecified, unspecified site: Secondary | ICD-10-CM | POA: Insufficient documentation

## 2022-11-14 DIAGNOSIS — Z955 Presence of coronary angioplasty implant and graft: Secondary | ICD-10-CM | POA: Insufficient documentation

## 2022-11-14 HISTORY — PX: FISTULOTOMY: SHX6413

## 2022-11-14 SURGERY — FISTULOTOMY
Anesthesia: General | Site: Rectum

## 2022-11-14 MED ORDER — SODIUM CHLORIDE 0.9 % IR SOLN
Status: DC | PRN
  Administered 2022-11-14: 1000 mL

## 2022-11-14 MED ORDER — DEXAMETHASONE SODIUM PHOSPHATE 10 MG/ML IJ SOLN
INTRAMUSCULAR | Status: DC | PRN
  Administered 2022-11-14: 10 mg via INTRAVENOUS

## 2022-11-14 MED ORDER — FENTANYL CITRATE (PF) 250 MCG/5ML IJ SOLN
INTRAMUSCULAR | Status: AC
  Filled 2022-11-14: qty 5

## 2022-11-14 MED ORDER — METRONIDAZOLE 500 MG/100ML IV SOLN
500.0000 mg | INTRAVENOUS | Status: AC
  Administered 2022-11-14: 500 mg via INTRAVENOUS

## 2022-11-14 MED ORDER — MIDAZOLAM HCL 2 MG/2ML IJ SOLN
INTRAMUSCULAR | Status: DC | PRN
  Administered 2022-11-14: 2 mg via INTRAVENOUS

## 2022-11-14 MED ORDER — KETOROLAC TROMETHAMINE 30 MG/ML IJ SOLN
30.0000 mg | Freq: Once | INTRAMUSCULAR | Status: AC
  Administered 2022-11-14: 30 mg via INTRAVENOUS
  Filled 2022-11-14: qty 1

## 2022-11-14 MED ORDER — CIPROFLOXACIN IN D5W 400 MG/200ML IV SOLN
400.0000 mg | INTRAVENOUS | Status: AC
  Administered 2022-11-14: 400 mg via INTRAVENOUS

## 2022-11-14 MED ORDER — ONDANSETRON HCL 4 MG/2ML IJ SOLN: 4.0000 mg | Freq: Once | INTRAMUSCULAR | Status: DC | PRN

## 2022-11-14 MED ORDER — CHLORHEXIDINE GLUCONATE CLOTH 2 % EX PADS
6.0000 | MEDICATED_PAD | Freq: Once | CUTANEOUS | Status: AC
  Administered 2022-11-14: 6 via TOPICAL

## 2022-11-14 MED ORDER — ONDANSETRON HCL 4 MG/2ML IJ SOLN
INTRAMUSCULAR | Status: DC | PRN
  Administered 2022-11-14: 4 mg via INTRAVENOUS

## 2022-11-14 MED ORDER — FENTANYL CITRATE (PF) 250 MCG/5ML IJ SOLN
INTRAMUSCULAR | Status: DC | PRN
  Administered 2022-11-14 (×5): 50 ug via INTRAVENOUS

## 2022-11-14 MED ORDER — LACTATED RINGERS IV SOLN: INTRAVENOUS | Status: DC

## 2022-11-14 MED ORDER — MEPERIDINE HCL 50 MG/ML IJ SOLN: 6.2500 mg | INTRAMUSCULAR | Status: DC | PRN

## 2022-11-14 MED ORDER — PROPOFOL 10 MG/ML IV BOLUS
INTRAVENOUS | Status: AC
  Filled 2022-11-14: qty 20

## 2022-11-14 MED ORDER — METRONIDAZOLE 500 MG/100ML IV SOLN
INTRAVENOUS | Status: AC
  Filled 2022-11-14: qty 100

## 2022-11-14 MED ORDER — LIDOCAINE 2% (20 MG/ML) 5 ML SYRINGE
INTRAMUSCULAR | Status: DC | PRN
  Administered 2022-11-14: 100 mg via INTRAVENOUS

## 2022-11-14 MED ORDER — OXYCODONE HCL 5 MG PO TABS: 5.0000 mg | ORAL_TABLET | ORAL | 0 refills | Status: DC | PRN

## 2022-11-14 MED ORDER — HYDROMORPHONE HCL 1 MG/ML IJ SOLN
0.2500 mg | INTRAMUSCULAR | Status: DC | PRN
  Administered 2022-11-14: 0.5 mg via INTRAVENOUS
  Filled 2022-11-14: qty 0.5

## 2022-11-14 MED ORDER — CHLORHEXIDINE GLUCONATE 0.12 % MT SOLN: 15.0000 mL | Freq: Once | OROMUCOSAL | Status: AC

## 2022-11-14 MED ORDER — PROPOFOL 10 MG/ML IV BOLUS
INTRAVENOUS | Status: DC | PRN
  Administered 2022-11-14: 180 mg via INTRAVENOUS

## 2022-11-14 MED ORDER — MIDAZOLAM HCL 2 MG/2ML IJ SOLN
INTRAMUSCULAR | Status: AC
  Filled 2022-11-14: qty 2

## 2022-11-14 MED ORDER — CHLORHEXIDINE GLUCONATE 0.12 % MT SOLN
OROMUCOSAL | Status: AC
  Administered 2022-11-14: 15 mL via OROMUCOSAL
  Filled 2022-11-14: qty 15

## 2022-11-14 MED ORDER — CHLORHEXIDINE GLUCONATE CLOTH 2 % EX PADS: 6.0000 | MEDICATED_PAD | Freq: Once | CUTANEOUS | Status: DC

## 2022-11-14 MED ORDER — ORAL CARE MOUTH RINSE: 15.0000 mL | Freq: Once | OROMUCOSAL | Status: AC

## 2022-11-14 MED ORDER — BUPIVACAINE LIPOSOME 1.3 % IJ SUSP
INTRAMUSCULAR | Status: AC
  Filled 2022-11-14: qty 10

## 2022-11-14 MED ORDER — BUPIVACAINE LIPOSOME 1.3 % IJ SUSP
INTRAMUSCULAR | Status: DC | PRN
  Administered 2022-11-14: 10 mL

## 2022-11-14 MED ORDER — HYDROGEN PEROXIDE 3 % EX SOLN
CUTANEOUS | Status: DC | PRN
  Administered 2022-11-14: 1

## 2022-11-14 MED ORDER — CIPROFLOXACIN IN D5W 400 MG/200ML IV SOLN
INTRAVENOUS | Status: AC
  Filled 2022-11-14: qty 200

## 2022-11-14 SURGICAL SUPPLY — 25 items
BLADE SURG SZ11 CARB STEEL (BLADE) ×2 IMPLANT
CLOTH BEACON ORANGE TIMEOUT ST (SAFETY) ×2 IMPLANT
COVER LIGHT HANDLE STERIS (MISCELLANEOUS) ×4 IMPLANT
COVER MAYO STAND XLG (MISCELLANEOUS) ×2 IMPLANT
ELECT REM PT RETURN 9FT ADLT (ELECTROSURGICAL) ×2
ELECTRODE REM PT RTRN 9FT ADLT (ELECTROSURGICAL) ×2 IMPLANT
GAUZE SPONGE 4X4 12PLY STRL (GAUZE/BANDAGES/DRESSINGS) ×3 IMPLANT
GLOVE BIOGEL PI IND STRL 7.0 (GLOVE) ×4 IMPLANT
GLOVE SURG SS PI 7.5 STRL IVOR (GLOVE) ×4 IMPLANT
GOWN STRL REUS W/TWL LRG LVL3 (GOWN DISPOSABLE) ×4 IMPLANT
KIT TURNOVER KIT A (KITS) ×2 IMPLANT
MANIFOLD NEPTUNE II (INSTRUMENTS) ×2 IMPLANT
NDL HYPO 21X1.5 SAFETY (NEEDLE) IMPLANT
NEEDLE HYPO 21X1.5 SAFETY (NEEDLE) ×2 IMPLANT
NS IRRIG 1000ML POUR BTL (IV SOLUTION) ×2 IMPLANT
PACK MINOR (CUSTOM PROCEDURE TRAY) ×2 IMPLANT
PAD ABD 5X9 TENDERSORB (GAUZE/BANDAGES/DRESSINGS) IMPLANT
PAD ARMBOARD 7.5X6 YLW CONV (MISCELLANEOUS) ×2 IMPLANT
SET BASIN LINEN APH (SET/KITS/TRAYS/PACK) ×2 IMPLANT
SHEET LAVH (DRAPES) ×2 IMPLANT
SUT CHROMIC 3 0 SH 27 (SUTURE) IMPLANT
SWAB CULTURE ESWAB REG 1ML (MISCELLANEOUS) ×1 IMPLANT
SYR 10ML LL (SYRINGE) ×1 IMPLANT
SYR BULB IRRIG 60ML STRL (SYRINGE) ×2 IMPLANT
SYR CONTROL 10ML LL (SYRINGE) ×1 IMPLANT

## 2022-11-14 NOTE — Anesthesia Postprocedure Evaluation (Signed)
Anesthesia Post Note  Patient: Nathan Waters  Procedure(s) Performed: FISTULOTOMY (Rectum)  Patient location during evaluation: Phase II Anesthesia Type: General Level of consciousness: awake and alert and oriented Pain management: pain level controlled Vital Signs Assessment: post-procedure vital signs reviewed and stable Respiratory status: spontaneous breathing, nonlabored ventilation and respiratory function stable Cardiovascular status: blood pressure returned to baseline and stable Postop Assessment: no apparent nausea or vomiting Anesthetic complications: no  No notable events documented.   Last Vitals:  Vitals:   11/14/22 0903 11/14/22 1157  BP: (!) 140/70 (!) 131/103  Pulse: 94   Resp: 15   Temp: 36.7 C 36.6 C  SpO2: 95%     Last Pain:  Vitals:   11/14/22 1157  TempSrc:   PainSc: 0-No pain                 Tambra Muller C Yuliet Needs

## 2022-11-14 NOTE — Anesthesia Procedure Notes (Signed)
Date/Time: 11/14/2022 11:45 AM  Performed by: Cynda Familia, CRNAOxygen Delivery Method: Simple face mask Placement Confirmation: positive ETCO2 and breath sounds checked- equal and bilateral Dental Injury: Teeth and Oropharynx as per pre-operative assessment

## 2022-11-14 NOTE — Op Note (Signed)
Patient:  Nathan Waters  DOB:  09-25-1981  MRN:  [id number]   Preop Diagnosis: Perineal fistula   Postop Diagnosis: Same  Procedure: Perineal fistulotomy  Surgeon: Aviva Signs, MD  Anes: General  Indications: Patient is a 42 year old white male who was referred to my care by Dr. Alyson Ingles of urology for evaluation of a perineal fistula.  The risks and benefits of the procedure including bleeding, infection, and the possibility of recurrence of the fistula were fully explained to the patient, who gave informed consent.  Procedure note: The patient was placed in the lithotomy position after general anesthesia was administered.  The perineum was prepped and draped using the usual sterile technique with Betadine.  Surgical site confirmation was performed.  A fistulous opening was present at the base of the scrotum.  Using a probe, I was able to identify the fistulous tract that went subcutaneously up into the scrotum.  It did not go down to the perirectal region.  The fistulous track was opened up.  Granulation tissue was noted at the base.  This was removed using a curette.  A bleeding was controlled using Bovie electrocautery.  The wound was irrigated with peroxide.  Betadine impregnated gauze was then placed into the wound.  A dry sterile dressing was applied.  All tape and needle counts were correct at the end of the procedure.  The patient was awakened and transferred to PACU in stable condition.  Dr. Alyson Ingles was present intraoperatively to review the results.  Complications: None  EBL: Minimal  Specimen: None

## 2022-11-14 NOTE — Anesthesia Procedure Notes (Signed)
Procedure Name: LMA Insertion Date/Time: 11/14/2022 11:09 AM  Performed by: Cynda Familia, CRNAPre-anesthesia Checklist: Patient identified, Emergency Drugs available, Suction available and Patient being monitored Patient Re-evaluated:Patient Re-evaluated prior to induction Oxygen Delivery Method: Circle System Utilized Preoxygenation: Pre-oxygenation with 100% oxygen Induction Type: IV induction Ventilation: Mask ventilation without difficulty LMA: LMA inserted and LMA with gastric port inserted LMA Size: 4.0 Number of attempts: 1 Placement Confirmation: positive ETCO2 Tube secured with: Tape Dental Injury: Teeth and Oropharynx as per pre-operative assessment  Comments: IV induction-- LMA atraumatic--- very poor dentition-- no upper teeth and broken chipped, loose lower teeth -- many missing lower teeth-- all unchanged-- bilat BS

## 2022-11-14 NOTE — Transfer of Care (Signed)
Immediate Anesthesia Transfer of Care Note  Patient: Nathan Waters  Procedure(s) Performed: FISTULOTOMY (Rectum)  Patient Location: PACU  Anesthesia Type:General  Level of Consciousness: awake  Airway & Oxygen Therapy: Patient Spontanous Breathing and Patient connected to face mask oxygen  Post-op Assessment: Report given to RN and Post -op Vital signs reviewed and stable  Post vital signs: Reviewed and stable  Last Vitals:  Vitals Value Taken Time  BP 131/103 11/14/22 1157  Temp    Pulse 102 11/14/22 1158  Resp 15 11/14/22 1158  SpO2 95 % 11/14/22 1158  Vitals shown include unvalidated device data.  Last Pain:  Vitals:   11/14/22 0903  TempSrc: Oral  PainSc: 1       Patients Stated Pain Goal: 5 (62/83/66 2947)  Complications: No notable events documented.

## 2022-11-14 NOTE — Anesthesia Preprocedure Evaluation (Signed)
Anesthesia Evaluation  Patient identified by MRN, date of birth, ID band Patient awake    Reviewed: Allergy & Precautions, H&P , NPO status , Patient's Chart, lab work & pertinent test results  Airway Mallampati: III  TM Distance: >3 FB Neck ROM: Full    Dental  (+) Dental Advisory Given, Poor Dentition, Missing   Pulmonary asthma , neg COPD,  COPD inhaler, Current Smoker   Pulmonary exam normal breath sounds clear to auscultation       Cardiovascular hypertension, Pt. on medications + CAD, + Past MI and + Cardiac Stents (2013)  Normal cardiovascular exam Rhythm:Regular Rate:Normal     Neuro/Psych  Headaches  negative psych ROS   GI/Hepatic negative GI ROS, Neg liver ROS,,,  Endo/Other  negative endocrine ROS    Renal/GU negative Renal ROS  negative genitourinary   Musculoskeletal negative musculoskeletal ROS (+)    Abdominal   Peds negative pediatric ROS (+)  Hematology  (+) Blood dyscrasia (hodgekins lymphoma)   Anesthesia Other Findings   Reproductive/Obstetrics negative OB ROS                             Anesthesia Physical Anesthesia Plan  ASA: 3  Anesthesia Plan: General   Post-op Pain Management: Dilaudid IV   Induction: Intravenous  PONV Risk Score and Plan: 2 and Ondansetron, Dexamethasone and Midazolam  Airway Management Planned: LMA  Additional Equipment:   Intra-op Plan:   Post-operative Plan: Extubation in OR  Informed Consent: I have reviewed the patients History and Physical, chart, labs and discussed the procedure including the risks, benefits and alternatives for the proposed anesthesia with the patient or authorized representative who has indicated his/her understanding and acceptance.     Dental advisory given  Plan Discussed with: CRNA and Surgeon  Anesthesia Plan Comments:         Anesthesia Quick Evaluation

## 2022-11-14 NOTE — Interval H&P Note (Signed)
History and Physical Interval Note:  11/14/2022 10:42 AM  Nathan Waters  has presented today for surgery, with the diagnosis of PERINEAL FISTULA.  The various methods of treatment have been discussed with the patient and family. After consideration of risks, benefits and other options for treatment, the patient has consented to  Procedure(s): INCISION AND DRAINAGE ABSCESS, PERINEAL (N/A) as a surgical intervention.  The patient's history has been reviewed, patient examined, no change in status, stable for surgery.  I have reviewed the patient's chart and labs.  Questions were answered to the patient's satisfaction.     Aviva Signs

## 2022-11-17 ENCOUNTER — Encounter (HOSPITAL_COMMUNITY): Payer: Self-pay | Admitting: General Surgery

## 2022-11-28 ENCOUNTER — Other Ambulatory Visit: Payer: Self-pay

## 2022-11-28 ENCOUNTER — Emergency Department (HOSPITAL_COMMUNITY)
Admission: EM | Admit: 2022-11-28 | Discharge: 2022-11-28 | Disposition: A | Discharge status: Home / Self Care | Payer: Medicaid Other | Attending: Emergency Medicine | Admitting: Emergency Medicine

## 2022-11-28 DIAGNOSIS — S161XXA Strain of muscle, fascia and tendon at neck level, initial encounter: Secondary | ICD-10-CM | POA: Diagnosis not present

## 2022-11-28 DIAGNOSIS — M542 Cervicalgia: Secondary | ICD-10-CM | POA: Diagnosis present

## 2022-11-28 DIAGNOSIS — J45909 Unspecified asthma, uncomplicated: Secondary | ICD-10-CM | POA: Diagnosis not present

## 2022-11-28 DIAGNOSIS — S161XXD Strain of muscle, fascia and tendon at neck level, subsequent encounter: Secondary | ICD-10-CM | POA: Diagnosis not present

## 2022-11-28 DIAGNOSIS — J210 Acute bronchiolitis due to respiratory syncytial virus: Secondary | ICD-10-CM | POA: Diagnosis not present

## 2022-11-28 DIAGNOSIS — Z1152 Encounter for screening for COVID-19: Secondary | ICD-10-CM | POA: Insufficient documentation

## 2022-11-28 DIAGNOSIS — Z79899 Other long term (current) drug therapy: Secondary | ICD-10-CM | POA: Insufficient documentation

## 2022-11-28 DIAGNOSIS — I10 Essential (primary) hypertension: Secondary | ICD-10-CM | POA: Insufficient documentation

## 2022-11-28 LAB — RESP PANEL BY RT-PCR (RSV, FLU A&B, COVID)  RVPGX2
Influenza A by PCR: NEGATIVE
Influenza B by PCR: NEGATIVE
Resp Syncytial Virus by PCR: POSITIVE — AB
SARS Coronavirus 2 by RT PCR: NEGATIVE

## 2022-11-28 MED ORDER — METHOCARBAMOL 500 MG PO TABS: 500.0000 mg | ORAL_TABLET | Freq: Three times a day (TID) | ORAL | 0 refills | Status: DC

## 2022-11-28 MED ORDER — ALBUTEROL SULFATE HFA 108 (90 BASE) MCG/ACT IN AERS
2.0000 | INHALATION_SPRAY | Freq: Once | RESPIRATORY_TRACT | Status: AC
  Administered 2022-11-28: 2 via RESPIRATORY_TRACT
  Filled 2022-11-28: qty 6.7

## 2022-11-28 MED ORDER — HYDROCODONE-ACETAMINOPHEN 5-325 MG PO TABS: ORAL_TABLET | ORAL | 0 refills | Status: DC
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
## 2022-11-28 NOTE — Discharge Instructions (Signed)
Your test today shows positive for RSV.  I recommend alternating Tylenol and ibuprofen every 4 and 6 hours respectively for pain and/or fever.  Plenty of fluids.  Use the albuterol inhaler, 1 to 2 puffs every 4-6 hours as needed.  Alternate ice and heat to your neck.  Contact one of the primary care clinics listed to establish primary care as you may benefit from an order for physical therapy.

## 2022-12-12 ENCOUNTER — Ambulatory Visit: Payer: Medicaid Other | Admitting: Internal Medicine

## 2022-12-13 ENCOUNTER — Ambulatory Visit: Payer: Medicaid Other | Admitting: Internal Medicine

## 2022-12-13 ENCOUNTER — Encounter: Payer: Self-pay | Admitting: Internal Medicine

## 2022-12-13 ENCOUNTER — Telehealth: Payer: Self-pay | Admitting: Internal Medicine

## 2022-12-13 VITALS — BP 135/87 | HR 80 | Resp 16 | Ht 71.0 in | Wt 271.1 lb

## 2022-12-13 DIAGNOSIS — Z720 Tobacco use: Secondary | ICD-10-CM

## 2022-12-13 DIAGNOSIS — Z0001 Encounter for general adult medical examination with abnormal findings: Secondary | ICD-10-CM | POA: Diagnosis not present

## 2022-12-13 DIAGNOSIS — J4531 Mild persistent asthma with (acute) exacerbation: Secondary | ICD-10-CM | POA: Diagnosis not present

## 2022-12-13 DIAGNOSIS — F5101 Primary insomnia: Secondary | ICD-10-CM

## 2022-12-13 MED ORDER — PREDNISONE 20 MG PO TABS: 40.0000 mg | ORAL_TABLET | Freq: Every day | ORAL | 0 refills | Status: DC

## 2022-12-13 MED ORDER — HYDROXYZINE PAMOATE 50 MG PO CAPS: 50.0000 mg | ORAL_CAPSULE | Freq: Every evening | ORAL | 0 refills | Status: DC | PRN

## 2022-12-13 MED ORDER — NICOTINE 21 MG/24HR TD PT24: 21.0000 mg | MEDICATED_PATCH | Freq: Every day | TRANSDERMAL | 0 refills | Status: DC

## 2022-12-13 NOTE — Patient Instructions (Signed)
Thank you, [name] for allowing [place] to provide your care today.   I have ordered the following labs for you:  Lab Orders         Lipid panel         TSH         VITAMIN D 25 Hydroxy (Vit-D Deficiency, Fractures)         Hemoglobin A1c          Reminders: Follow up in 4 weeks     Tamsen Snider, M.D.

## 2022-12-13 NOTE — Telephone Encounter (Signed)
[facility] called needs either a new RX or phone call the prescription for  predniSONE (DELTASONE) 20 MG tablet [id number]  Was written for only 5 tablets.  Please call pharmacy at [phone] needs more clarification on prescription

## 2022-12-13 NOTE — Progress Notes (Unsigned)
HPI:[name] is a 42 y.o. male who presents to establish care. For the details of today's visit, please refer to the assessment and plan.   Past Medical History:  Diagnosis Date   Asthma    Cancer ([facility])    stage 3 hodgkins lymphoma   Coronary atherosclerosis of native coronary artery    BMS to mid circumflex 11/2011, LVEF 55-60%   Essential hypertension    Migraine    Mixed hyperlipidemia    STEMI (ST elevation myocardial infarction) ([place])    Complicated by VF arrest 11/2011    Past Surgical History:  Procedure Laterality Date   AXILLARY LYMPH NODE BIOPSY Left 07/26/2019   Procedure: AXILLARY LYMPH NODE BIOPSY;  Surgeon: Nathan Signs, MD;  Location: [facility];  Service: General;  Laterality: Left;   BACK SURGERY     CARDIAC CATHETERIZATION     CHOLECYSTECTOMY N/A 08/26/2019   Procedure: LAPAROSCOPIC CHOLECYSTECTOMY;  Surgeon: Nathan Signs, MD;  Location: [facility];  Service: General;  Laterality: N/A;   FISTULOTOMY N/A 11/14/2022   Procedure: FISTULOTOMY;  Surgeon: Nathan Signs, MD;  Location: [facility];  Service: General;  Laterality: N/A;   INGUINAL LYMPH NODE BIOPSY Right 05/26/2021   Procedure: INGUINAL LYMPH NODE BIOPSY;  Surgeon: Nathan Signs, MD;  Location: [facility];  Service: General;  Laterality: Right;   LEFT HEART CATHETERIZATION WITH CORONARY ANGIOGRAM N/A 11/21/2011   Procedure: LEFT HEART CATHETERIZATION WITH CORONARY ANGIOGRAM;  Surgeon: Nathan M Martinique, MD;  Location: [facility] CATH LAB;  Service: Cardiovascular;  Laterality: N/A;   LYMPH NODE BIOPSY Right 08/26/2019   Procedure: LYMPH NODE BIOPSY, INGUINAL;  Surgeon: Nathan Signs, MD;  Location: [facility];  Service: General;  Laterality: Right;   PERCUTANEOUS CORONARY STENT INTERVENTION (PCI-S) N/A 11/21/2011   Procedure: PERCUTANEOUS CORONARY STENT INTERVENTION (PCI-S);  Surgeon: Nathan M Martinique, MD;  Location: [facility] C[facility] LAB;  Service: Cardiovascular;  Laterality: N/A;   PORT-A-CATH REMOVAL N/A 12/27/2021   Procedure: MINOR  REMOVAL PORT-A-CATH;  Surgeon: Nathan Signs, MD;  Location: [facility];  Service: General;  Laterality: N/A;   PORTACATH PLACEMENT Left 10/01/2019   Procedure: INSERTION PORT-A-CATH (catheter attached left subclavian);  Surgeon: Nathan Signs, MD;  Location: [facility];  Service: General;  Laterality: Left;   SPLENECTOMY      Family History  Problem Relation Age of Onset   Diabetes Mother    Malignant hyperthermia Mother    Cancer Mother        throat cancer   Heart disease Father    Cancer Father    Malignant hyperthermia Brother    Heart disease Brother    Asthma Brother    COPD Brother    ADD / ADHD Daughter    ADD / ADHD Son     Social History   Tobacco Use   Smoking status: Every Day    Packs/day: 1.00    Years: 20.00    Total pack years: 20.00    Types: Cigarettes   Smokeless tobacco: Never  Vaping Use   Vaping Use: Former   Start date: 04/27/2012  Substance Use Topics   Alcohol use: Yes    Comment: Occasionally   Drug use: No      Physical Exam: Vitals:   12/13/22 1455  BP: 135/87  Pulse: 80  Resp: 16  SpO2: 94%  Weight: 271 lb 1.9 oz (123 kg)  Height: '5\' 11"'$  (1.803 m)     Physical Exam Constitutional:      General:  He is not in acute distress.    Appearance: He is not ill-appearing.  Eyes:     General: No scleral icterus. Cardiovascular:     Rate and Rhythm: Normal rate and regular rhythm.  Pulmonary:     Effort: Pulmonary effort is normal. No respiratory distress.     Breath sounds: Wheezing and rhonchi present. No rales.  Skin:    Coloration: Skin is not jaundiced.     Comments: Telangiectasias on nose      Assessment & Plan:   Tobacco use Patient counseled on smoking cessation. He would like to quit smoking . He has quit previously with use of nicotine patches. -Start 21 mg nicotine patch, has follow up in 4 weeks to check on progress. Will need step down patches prescribed  Primary insomnia Patient reports chronic insomnia. He falls  asleep in recliner watching TV and then has difficulty falling asleep. He keeps his home dark bc he has sensitivity to light since cancer treatment. Reports 2 week periods of sleeping poorly. No other manic symptoms.  - I recommend regular bedtime and rise time. Avoid caffeine after lunch. Avoid alcohol near bedtime. Exercise can be beneficial for sleep, but recommended 4 to 6 hours before bed. Have a sleep routine which tells your body it is time to sleep. For example, brush your teeth, wash your face, and read a book for 30 minutes. Avoid screen time near bedtime. Sleep in a quiet and dark environment.  - Hydroxyzine PRN   Mild persistent asthma with acute exacerbation Hx of asthma. Recently had RSV. Continues to have cough and some mild dyspnea. Given albuterol in ED. - Prednisone 40 mg x 5 days  Encounter for general adult medical examination with abnormal findings Establish care today. Will check baseline labs not recently checked. Patients BMI is 37.8. - Lipid panel - TSH - VITAMIN D 25 Hydroxy (Vit-D Deficiency, Fractures) - Hemoglobin A1c     Nathan Dy, MD

## 2022-12-14 ENCOUNTER — Other Ambulatory Visit: Payer: Self-pay

## 2022-12-14 DIAGNOSIS — J4531 Mild persistent asthma with (acute) exacerbation: Secondary | ICD-10-CM | POA: Insufficient documentation

## 2022-12-14 DIAGNOSIS — Z0001 Encounter for general adult medical examination with abnormal findings: Secondary | ICD-10-CM | POA: Insufficient documentation

## 2022-12-14 DIAGNOSIS — F5101 Primary insomnia: Secondary | ICD-10-CM | POA: Insufficient documentation

## 2022-12-14 LAB — LIPID PANEL
Chol/HDL Ratio: 5.2 ratio — ABNORMAL HIGH (ref 0.0–5.0)
Cholesterol, Total: 176 mg/dL (ref 100–199)
HDL: 34 mg/dL — ABNORMAL LOW (ref >39)
LDL Chol Calc (NIH): 121 mg/dL — ABNORMAL HIGH (ref 0–99)
Triglycerides: 112 mg/dL (ref 0–149)
VLDL Cholesterol Cal: 21 mg/dL (ref 5–40)

## 2022-12-14 LAB — VITAMIN D 25 HYDROXY (VIT D DEFICIENCY, FRACTURES): Vit D, 25-Hydroxy: <4 ng/mL — ABNORMAL LOW (ref 30.0–100.0)

## 2022-12-14 LAB — TSH: TSH: 1.12 u[IU]/mL (ref 0.450–4.500)

## 2022-12-14 LAB — HEMOGLOBIN A1C
Est. average glucose Bld gHb Est-mCnc: 128 mg/dL
Hgb A1c MFr Bld: 6.1 % — ABNORMAL HIGH (ref 4.8–5.6)

## 2022-12-14 MED ORDER — PREDNISONE 20 MG PO TABS: 40.0000 mg | ORAL_TABLET | Freq: Every day | ORAL | 0 refills | Status: AC

## 2022-12-14 NOTE — Assessment & Plan Note (Signed)
Patient reports chronic insomnia. He falls asleep in recliner watching TV and then has difficulty falling asleep. He keeps his home dark bc he has sensitivity to light since cancer treatment. Reports 2 week periods of sleeping poorly. No other manic symptoms.  - I recommend regular bedtime and rise time. Avoid caffeine after lunch. Avoid alcohol near bedtime. Exercise can be beneficial for sleep, but recommended 4 to 6 hours before bed. Have a sleep routine which tells your body it is time to sleep. For example, brush your teeth, wash your face, and read a book for 30 minutes. Avoid screen time near bedtime. Sleep in a quiet and dark environment.  - Hydroxyzine PRN

## 2022-12-14 NOTE — Telephone Encounter (Signed)
Changed the quantity to 10

## 2022-12-14 NOTE — Assessment & Plan Note (Signed)
Hx of asthma. Recently had RSV. Continues to have cough and some mild dyspnea. Given albuterol in ED. - Prednisone 40 mg x 5 days

## 2022-12-14 NOTE — Assessment & Plan Note (Signed)
Patient counseled on smoking cessation. He would like to quit smoking . He has quit previously with use of nicotine patches. -Start 21 mg nicotine patch, has follow up in 4 weeks to check on progress. Will need step down patches prescribed

## 2022-12-14 NOTE — Assessment & Plan Note (Signed)
Establish care today. Will check baseline labs not recently checked. Patients BMI is 37.8. - Lipid panel - TSH - VITAMIN D 25 Hydroxy (Vit-D Deficiency, Fractures) - Hemoglobin A1c

## 2022-12-16 ENCOUNTER — Other Ambulatory Visit: Payer: Self-pay | Admitting: Internal Medicine

## 2022-12-16 MED ORDER — VITAMIN D3 25 MCG (1000 UT) PO CAPS: 1000.0000 [IU] | ORAL_CAPSULE | Freq: Every day | ORAL | 0 refills | Status: DC

## 2022-12-16 MED ORDER — CHOLECALCIFEROL 1.25 MG (50000 UT) PO CAPS: 50000.0000 [IU] | ORAL_CAPSULE | ORAL | 0 refills | Status: AC

## 2022-12-26 ENCOUNTER — Ambulatory Visit: Payer: Medicaid Other | Admitting: Urology

## 2022-12-26 VITALS — BP 115/78 | HR 99

## 2022-12-26 DIAGNOSIS — N509 Disorder of male genital organs, unspecified: Secondary | ICD-10-CM

## 2022-12-26 DIAGNOSIS — K604 Rectal fistula: Secondary | ICD-10-CM | POA: Diagnosis not present

## 2022-12-26 MED ORDER — BACITRACIN 500 UNIT/GM EX OINT: 1.0000 | TOPICAL_OINTMENT | Freq: Two times a day (BID) | CUTANEOUS | 3 refills | Status: DC

## 2022-12-26 NOTE — Progress Notes (Unsigned)
12/26/2022 3:09 PM   Nathan Waters [birth year] [id number]  Referring provider: [facility] [address] [place],  [place] [address]  No chief complaint on file.   HPI:    PMH: Past Medical History:  Diagnosis Date   Asthma    Cancer ([facility])    stage 3 hodgkins lymphoma   Coronary atherosclerosis of native coronary artery    BMS to mid circumflex 11/2011, LVEF 55-60%   Essential hypertension    Migraine    Mixed hyperlipidemia    STEMI (ST elevation myocardial infarction) ([place])    Complicated by VF arrest 11/2011    Surgical History: Past Surgical History:  Procedure Laterality Date   AXILLARY LYMPH NODE BIOPSY Left 07/26/2019   Procedure: AXILLARY LYMPH NODE BIOPSY;  Surgeon: Aviva Signs, MD;  Location: [facility];  Service: General;  Laterality: Left;   BACK SURGERY     CARDIAC CATHETERIZATION     CHOLECYSTECTOMY N/A 08/26/2019   Procedure: LAPAROSCOPIC CHOLECYSTECTOMY;  Surgeon: Aviva Signs, MD;  Location: [facility];  Service: General;  Laterality: N/A;   FISTULOTOMY N/A 11/14/2022   Procedure: FISTULOTOMY;  Surgeon: Aviva Signs, MD;  Location: [facility];  Service: General;  Laterality: N/A;   INGUINAL LYMPH NODE BIOPSY Right 05/26/2021   Procedure: INGUINAL LYMPH NODE BIOPSY;  Surgeon: Aviva Signs, MD;  Location: [facility];  Service: General;  Laterality: Right;   LEFT HEART CATHETERIZATION WITH CORONARY ANGIOGRAM N/A 11/21/2011   Procedure: LEFT HEART CATHETERIZATION WITH CORONARY ANGIOGRAM;  Surgeon: Peter M Martinique, MD;  Location: [facility] CATH LAB;  Service: Cardiovascular;  Laterality: N/A;   LYMPH NODE BIOPSY Right 08/26/2019   Procedure: LYMPH NODE BIOPSY, INGUINAL;  Surgeon: Aviva Signs, MD;  Location: [facility];  Service: General;  Laterality: Right;   PERCUTANEOUS CORONARY STENT INTERVENTION (PCI-S) N/A 11/21/2011   Procedure: PERCUTANEOUS CORONARY STENT INTERVENTION (PCI-S);  Surgeon: Peter M Martinique, MD;  Location: [facility] CATH LAB;  Service: Cardiovascular;   Laterality: N/A;   PORT-A-CATH REMOVAL N/A 12/27/2021   Procedure: MINOR REMOVAL PORT-A-CATH;  Surgeon: Aviva Signs, MD;  Location: [facility];  Service: General;  Laterality: N/A;   PORTACATH PLACEMENT Left 10/01/2019   Procedure: INSERTION PORT-A-CATH (catheter attached left subclavian);  Surgeon: Aviva Signs, MD;  Location: [facility];  Service: General;  Laterality: Left;   SPLENECTOMY      Home Medications:  Allergies as of 12/26/2022       Reactions   Penicillins Anaphylaxis   Did it involve swelling of the face/tongue/throat, SOB, or low BP? Yes Did it involve sudden or severe rash/hives, skin peeling, or any reaction on the inside of your mouth or nose? No Did you need to seek medical attention at a hospital or doctor's office? Yes When did it last happen?  Childhood allergy     If all above answers are "NO", may proceed with cephalosporin use.        Medication List        Accurate as of December 26, 2022  3:09 PM. If you have any questions, ask your nurse or doctor.          albuterol 108 (90 Base) MCG/ACT inhaler Commonly known as: VENTOLIN HFA Inhale 2 puffs into the lungs every 6 (six) hours as needed for wheezing or shortness of breath.   Cholecalciferol 1.25 MG (50000 UT) capsule Take 1 capsule (50,000 Units total) by mouth once a week.   Vitamin D3 25 MCG (1000 UT) capsule Generic drug: Cholecalciferol  Take 1 capsule (1,000 Units total) by mouth daily.   hydrOXYzine 50 MG capsule Commonly known as: VISTARIL Take 1 capsule (50 mg total) by mouth at bedtime as needed.   ibuprofen 200 MG tablet Commonly known as: ADVIL Take 800 mg by mouth every 8 (eight) hours as needed (headache).   lidocaine 5 % Commonly known as: Lidoderm Place 1 patch onto the skin daily. Remove & Discard patch within 12 hours or as directed by MD   lidocaine-prilocaine cream Commonly known as: EMLA Apply 1 application. topically once. Prior to procedure.   nicotine 21 mg/24hr  patch Commonly known as: NICODERM CQ - dosed in mg/24 hours Place 1 patch (21 mg total) onto the skin daily.        Allergies:  Allergies  Allergen Reactions   Penicillins Anaphylaxis    Did it involve swelling of the face/tongue/throat, SOB, or low BP? Yes Did it involve sudden or severe rash/hives, skin peeling, or any reaction on the inside of your mouth or nose? No Did you need to seek medical attention at a hospital or doctor's office? Yes When did it last happen?  Childhood allergy     If all above answers are "NO", may proceed with cephalosporin use.     Family History: Family History  Problem Relation Age of Onset   Diabetes Mother    Malignant hyperthermia Mother    Cancer Mother        throat cancer   Heart disease Father    Cancer Father    Malignant hyperthermia Brother    Heart disease Brother    Asthma Brother    COPD Brother    ADD / ADHD Daughter    ADD / ADHD Son     Social History:  reports that he has been smoking cigarettes. He has a 20.00 pack-year smoking history. He has never used smokeless tobacco. He reports current alcohol use. He reports that he does not use drugs.  ROS: All other review of systems were reviewed and are negative except what is noted above in HPI  Physical Exam: BP 115/78   Pulse 99   Constitutional:  Alert and oriented, No acute distress. HEENT: [place] AT, moist mucus membranes.  Trachea midline, no masses. Cardiovascular: No clubbing, cyanosis, or edema. Respiratory: Normal respiratory effort, no increased work of breathing. GI: Abdomen is soft, nontender, nondistended, no abdominal masses GU: No CVA tenderness.  Lymph: No cervical or inguinal lymphadenopathy. Skin: No rashes, bruises or suspicious lesions. Neurologic: Grossly intact, no focal deficits, moving all 4 extremities. Psychiatric: Normal mood and affect.  Laboratory Data: Lab Results  Component Value Date   WBC 11.0 (H) 09/29/2022   HGB 15.8 09/29/2022    HCT 46.7 09/29/2022   MCV 97.7 09/29/2022   PLT 334 09/29/2022    Lab Results  Component Value Date   CREATININE 0.89 09/29/2022    No results found for: "PSA"  No results found for: "TESTOSTERONE"  Lab Results  Component Value Date   HGBA1C 6.1 (H) 12/13/2022    Urinalysis    Component Value Date/Time   COLORURINE YELLOW 06/14/2019 1800   APPEARANCEUR Clear 10/24/2022 1551   LABSPEC 1.025 06/14/2019 1800   PHURINE 6.0 06/14/2019 1800   GLUCOSEU Negative 10/24/2022 [place] 06/14/2019 1800   BILIRUBINUR Negative 10/24/2022 1551   KETONESUR 20 (A) 06/14/2019 1800   PROTEINUR Negative 10/24/2022 1551   PROTEINUR NEGATIVE 06/14/2019 1800   UROBILINOGEN 1.0 11/21/2011 1125  NITRITE Negative 10/24/2022 1551   NITRITE NEGATIVE 06/14/2019 1800   LEUKOCYTESUR Negative 10/24/2022 1551   LEUKOCYTESUR NEGATIVE 06/14/2019 1800    Lab Results  Component Value Date   LABMICR Comment 10/24/2022   BACTERIA RARE 06/16/2010    Pertinent Imaging: *** No results found for this or any previous visit.  No results found for this or any previous visit.  No results found for this or any previous visit.  No results found for this or any previous visit.  No results found for this or any previous visit.  No valid procedures specified. No results found for this or any previous visit.  No results found for this or any previous visit.   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  Nicolette Bang, MD  [facility] [place]

## 2022-12-27 ENCOUNTER — Encounter: Payer: Self-pay | Admitting: Urology

## 2023-01-10 ENCOUNTER — Telehealth: Payer: Medicaid Other | Admitting: Internal Medicine

## 2023-01-10 ENCOUNTER — Encounter: Payer: Self-pay | Admitting: Internal Medicine

## 2023-01-10 DIAGNOSIS — F5101 Primary insomnia: Secondary | ICD-10-CM

## 2023-01-10 DIAGNOSIS — Z720 Tobacco use: Secondary | ICD-10-CM

## 2023-01-10 DIAGNOSIS — E559 Vitamin D deficiency, unspecified: Secondary | ICD-10-CM

## 2023-01-10 DIAGNOSIS — J4520 Mild intermittent asthma, uncomplicated: Secondary | ICD-10-CM

## 2023-01-10 DIAGNOSIS — F17200 Nicotine dependence, unspecified, uncomplicated: Secondary | ICD-10-CM | POA: Diagnosis not present

## 2023-01-10 NOTE — Assessment & Plan Note (Signed)
Noted on recent labs.  He is currently on high-dose, weekly vitamin D supplementation. -Repeat vitamin D level upon completion of weekly vitamin D supplement

## 2023-01-10 NOTE — Assessment & Plan Note (Signed)
Previously prescribed hydroxyzine for as needed use in the setting of insomnia.  He forgot that he had this prescription and has not tried taking it yet.  Denies any worsening of his symptoms.

## 2023-01-10 NOTE — Assessment & Plan Note (Signed)
Currently smoking less than 0.5 packs/day of cigarettes.  Has been vaping instead.  Has not been applying nicotine patches because packaging states that he cannot smoke while wearing a patch. -Reviewed with [facility] Waters that patches are used as nicotine replacement therapy.  He is advised not to smoke cigarettes while applying a patch as this would simply be supplementing the nicotine that he is already getting from cigarettes.  At this time he declines further NRT products and believes he can reduce his smoking frequency on his own.  He was again counseled on the importance of complete cessation from tobacco products.

## 2023-01-10 NOTE — Assessment & Plan Note (Signed)
Asymptomatic currently.  Recently treated for asthma exacerbation with prednisone 40 mg x 5 days.  He has not needed to use his albuterol inhaler recently.

## 2023-01-10 NOTE — Progress Notes (Signed)
Virtual Visit via Video Note  I connected with Nathan Waters on 01/10/23 at  2:40 PM EDT by a video enabled telemedicine application and verified that I am speaking with the correct person using two identifiers.  Patient Location: Home Provider Location: Office/Clinic  I discussed the limitations, risks, security, and privacy concerns of performing an evaluation and management service by video and the availability of in person appointments. I also discussed with the patient that there may be a patient responsible charge related to this service. The patient expressed understanding and agreed to proceed.  Subjective: PCP: Johnette Abraham, MD  Chief Complaint  Patient presents with   Follow-up   Nathan Waters has been evaluated today through video encounter for routine follow-up.  He was last seen at [facility] on 2/27 by Dr. Court Joy as a new patient presenting to establish care.  At that time nicotine patches were prescribed for smoking cessation, Atarax was prescribed for as needed use in the setting of insomnia, and prednisone 40 mg x 5 days was prescribed for treatment of asthma exacerbation.  In the interim Nathan Waters has been seen by urology.  There have otherwise been no acute interval events.  Nathan Waters reports feeling well today.  He states that he has not been using nicotine patches but has reduced the frequency of smoking cigarettes.  He is down to less than 0.5 packs/day.  He has not tried taking hydroxyzine for insomnia because he forgot he had the prescription.  His respiratory status has improved after completing a 5-day course of prednisone.  His acute concern today is wondering why he was prescribed weekly, high-dose vitamin D supplementation.  ROS: Per HPI  Current Outpatient Medications:    albuterol (PROVENTIL HFA;VENTOLIN HFA) 108 (90 Base) MCG/ACT inhaler, Inhale 2 puffs into the lungs every 6 (six) hours as needed for wheezing or shortness of breath., Disp: , Rfl:    bacitracin  500 UNIT/GM ointment, Apply 1 Application topically 2 (two) times daily., Disp: 15 g, Rfl: 3   Cholecalciferol (VITAMIN D3) 25 MCG (1000 UT) capsule, Take 1 capsule (1,000 Units total) by mouth daily., Disp: 90 capsule, Rfl: 0   Cholecalciferol 1.25 MG (50000 UT) capsule, Take 1 capsule (50,000 Units total) by mouth once a week., Disp: 8 capsule, Rfl: 0   hydrOXYzine (VISTARIL) 50 MG capsule, Take 1 capsule (50 mg total) by mouth at bedtime as needed., Disp: 30 capsule, Rfl: 0   ibuprofen (ADVIL) 200 MG tablet, Take 800 mg by mouth every 8 (eight) hours as needed (headache)., Disp: , Rfl:    lidocaine (LIDODERM) 5 %, Place 1 patch onto the skin daily. Remove & Discard patch within 12 hours or as directed by MD, Disp: 30 patch, Rfl: 0   lidocaine-prilocaine (EMLA) cream, Apply 1 application. topically once. Prior to procedure., Disp: , Rfl:    nicotine (NICODERM CQ - DOSED IN MG/24 HOURS) 21 mg/24hr patch, Place 1 patch (21 mg total) onto the skin daily., Disp: 42 patch, Rfl: 0  Assessment and Plan:  Mild intermittent asthma without complication Assessment & Plan: Asymptomatic currently.  Recently treated for asthma exacerbation with prednisone 40 mg x 5 days.  He has not needed to use his albuterol inhaler recently.  Primary insomnia Assessment & Plan: Previously prescribed hydroxyzine for as needed use in the setting of insomnia.  He forgot that he had this prescription and has not tried taking it yet.  Denies any worsening of his symptoms.  Tobacco use  Assessment & Plan: Currently smoking less than 0.5 packs/day of cigarettes.  Has been vaping instead.  Has not been applying nicotine patches because packaging states that he cannot smoke while wearing a patch. -Reviewed with Nathan Waters that patches are used as nicotine replacement therapy.  He is advised not to smoke cigarettes while applying a patch as this would simply be supplementing the nicotine that he is already getting from cigarettes.   At this time he declines further NRT products and believes he can reduce his smoking frequency on his own.  He was again counseled on the importance of complete cessation from tobacco products.  Vitamin D deficiency Assessment & Plan: Noted on recent labs.  He is currently on high-dose, weekly vitamin D supplementation. -Repeat vitamin D level upon completion of weekly vitamin D supplement  Follow Up Instructions: Return in about 3 months (around 04/12/2023).   I discussed the assessment and treatment plan with the patient. The patient was provided an opportunity to ask questions, and all were answered. The patient agreed with the plan and demonstrated an understanding of the instructions.   The patient was advised to call back or seek an in-person evaluation if the symptoms worsen or if the condition fails to improve as anticipated.  The above assessment and management plan was discussed with the patient. The patient verbalized understanding of and has agreed to the management plan.   Johnette Abraham, MD

## 2023-02-10 ENCOUNTER — Ambulatory Visit: Payer: Medicaid Other | Admitting: Urology

## 2023-02-10 ENCOUNTER — Encounter: Payer: Self-pay | Admitting: Urology

## 2023-02-10 VITALS — BP 117/79 | HR 92

## 2023-02-10 DIAGNOSIS — Z09 Encounter for follow-up examination after completed treatment for conditions other than malignant neoplasm: Secondary | ICD-10-CM

## 2023-02-10 DIAGNOSIS — Z8719 Personal history of other diseases of the digestive system: Secondary | ICD-10-CM

## 2023-02-10 DIAGNOSIS — K604 Rectal fistula: Secondary | ICD-10-CM

## 2023-02-10 NOTE — Progress Notes (Unsigned)
02/10/2023 10:03 AM   Nathan Waters Oct 12, 1981 [id number]  Referring provider: Billie Lade, MD [address] [place],  [place] [address]  No chief complaint on file.   HPI:    PMH: Past Medical History:  Diagnosis Date   Asthma    Cancer (HCC)    stage 3 hodgkins lymphoma   Coronary atherosclerosis of native coronary artery    BMS to mid circumflex 11/2011, LVEF 55-60%   Essential hypertension    Migraine    Mixed hyperlipidemia    STEMI (ST elevation myocardial infarction) (HCC)    Complicated by VF arrest 11/2011    Surgical History: Past Surgical History:  Procedure Laterality Date   AXILLARY LYMPH NODE BIOPSY Left 07/26/2019   Procedure: AXILLARY LYMPH NODE BIOPSY;  Surgeon: Franky Macho, MD;  Location: [facility];  Service: General;  Laterality: Left;   BACK SURGERY     CARDIAC CATHETERIZATION     CHOLECYSTECTOMY N/A 08/26/2019   Procedure: LAPAROSCOPIC CHOLECYSTECTOMY;  Surgeon: Franky Macho, MD;  Location: [facility];  Service: General;  Laterality: N/A;   FISTULOTOMY N/A 11/14/2022   Procedure: FISTULOTOMY;  Surgeon: Franky Macho, MD;  Location: [facility];  Service: General;  Laterality: N/A;   INGUINAL LYMPH NODE BIOPSY Right 05/26/2021   Procedure: INGUINAL LYMPH NODE BIOPSY;  Surgeon: Franky Macho, MD;  Location: [facility];  Service: General;  Laterality: Right;   LEFT HEART CATHETERIZATION WITH CORONARY ANGIOGRAM N/A 11/21/2011   Procedure: LEFT HEART CATHETERIZATION WITH CORONARY ANGIOGRAM;  Surgeon: Peter M Swaziland, MD;  Location: [facility] CATH LAB;  Service: Cardiovascular;  Laterality: N/A;   LYMPH NODE BIOPSY Right 08/26/2019   Procedure: LYMPH NODE BIOPSY, INGUINAL;  Surgeon: Franky Macho, MD;  Location: [facility];  Service: General;  Laterality: Right;   PERCUTANEOUS CORONARY STENT INTERVENTION (PCI-S) N/A 11/21/2011   Procedure: PERCUTANEOUS CORONARY STENT INTERVENTION (PCI-S);  Surgeon: Peter M Swaz[place], MD;  Location: [facility] CATH LAB;  Service: Cardiovascular;   Laterality: N/A;   PORT-A-CATH REMOVAL N/A 12/27/2021   Procedure: MINOR REMOVAL PORT-A-CATH;  Surgeon: Franky Macho, MD;  Location: [facility];  Service: General;  Laterality: N/A;   PORTACATH PLACEMENT Left 10/01/2019   Procedure: INSERTION PORT-A-CATH (catheter attached left subclavian);  Surgeon: Franky Macho, MD;  Location: [facility];  Service: General;  Laterality: Left;   SPLENECTOMY      Home Medications:  Allergies as of 02/10/2023       Reactions   Penicillins Anaphylaxis   Did it involve swelling of the face/tongue/throat, SOB, or low BP? Yes Did it involve sudden or severe rash/hives, skin peeling, or any reaction on the inside of your mouth or nose? No Did you need to seek medical attention at a hospital or doctor's office? Yes When did it last happen?  Childhood allergy     If all above answers are "NO", may proceed with cephalosporin use.        Medication List        Accurate as of February 10, 2023 10:03 AM. If you have any questions, ask your nurse or doctor.          albuterol 108 (90 Base) MCG/ACT inhaler Commonly known as: VENTOLIN HFA Inhale 2 puffs into the lungs every 6 (six) hours as needed for wheezing or shortness of breath.   bacitracin 500 UNIT/GM ointment Apply 1 Application topically 2 (two) times daily.   Cholecalciferol 1.25 MG (50000 UT) capsule Take 1 capsule (50,000 Units total) by mouth  once a week.   Vitamin D3 25 MCG (1000 UT) Caps Take 1 capsule (1,000 Units total) by mouth daily.   hydrOXYzine 50 MG capsule Commonly known as: VISTARIL Take 1 capsule (50 mg total) by mouth at bedtime as needed.   ibuprofen 200 MG tablet Commonly known as: ADVIL Take 800 mg by mouth every 8 (eight) hours as needed (headache).   lidocaine 5 % Commonly known as: Lidoderm Place 1 patch onto the skin daily. Remove & Discard patch within 12 hours or as directed by MD   lidocaine-prilocaine cream Commonly known as: EMLA Apply 1 application. topically  once. Prior to procedure.   nicotine 21 mg/24hr patch Commonly known as: NICODERM CQ - dosed in mg/24 hours Place 1 patch (21 mg total) onto the skin daily.        Allergies:  Allergies  Allergen Reactions   Penicillins Anaphylaxis    Did it involve swelling of the face/tongue/throat, SOB, or low BP? Yes Did it involve sudden or severe rash/hives, skin peeling, or any reaction on the inside of your mouth or nose? No Did you need to seek medical attention at a hospital or doctor's office? Yes When did it last happen?  Childhood allergy     If all above answers are "NO", may proceed with cephalosporin use.     Family History: Family History  Problem Relation Age of Onset   Diabetes Mother    Malignant hyperthermia Mother    Cancer Mother        throat cancer   Heart disease Father    Cancer Father    Malignant hyperthermia Brother    Heart disease Brother    Asthma Brother    COPD Brother    ADD / ADHD Daughter    ADD / ADHD Son     Social History:  reports that he has been smoking cigarettes. He has a 20.00 pack-year smoking history. He has never used smokeless tobacco. He reports current alcohol use. He reports that he does not use drugs.  ROS: All other review of systems were reviewed and are negative except what is noted above in HPI  Physical Exam: BP 117/79   Pulse 92   Constitutional:  Alert and oriented, No acute distress. HEENT: [place] AT, moist mucus membranes.  Trachea midline, no masses. Cardiovascular: No clubbing, cyanosis, or edema. Respiratory: Normal respiratory effort, no increased work of breathing. GI: Abdomen is soft, nontender, nondistended, no abdominal masses GU: No CVA tenderness.  Lymph: No cervical or inguinal lymphadenopathy. Skin: No rashes, bruises or suspicious lesions. Neurologic: Grossly intact, no focal deficits, moving all 4 extremities. Psychiatric: Normal mood and affect.  Laboratory Data: Lab Results  Component Value Date    WBC 11.0 (H) 09/29/2022   HGB 15.8 09/29/2022   HCT 46.7 09/29/2022   MCV 97.7 09/29/2022   PLT 334 09/29/2022    Lab Results  Component Value Date   CREATININE 0.89 09/29/2022    No results found for: "PSA"  No results found for: "TESTOSTERONE"  Lab Results  Component Value Date   HGBA1C 6.1 (H) 12/13/2022    Urinalysis    Component Value Date/Time   COLORURINE YELLOW 06/14/2019 1800   APPEARANCEUR Clear 10/24/2022 1551   LABSPEC 1.025 06/14/2019 1800   PHURINE 6.0 06/14/2019 1800   GLUCOSEU Negative 10/24/2022 1551   HGBUR NEGATIVE 06/14/2019 1800   BILIRUBINUR Negative 10/24/2022 1551   KETONESUR 20 (A) 06/14/2019 1800   PROTEINUR Negative 10/24/2022 1551  PROTEINUR NEGATIVE 06/14/2019 1800   UROBILINOGEN 1.0 11/21/2011 1125   NITRITE Negative 10/24/2022 1551   NITRITE NEGATIVE 06/14/2019 1800   LEUKOCYTESUR Negative 10/24/2022 1551   LEUKOCYTESUR NEGATIVE 06/14/2019 1800    Lab Results  Component Value Date   LABMICR Comment 10/24/2022   BACTERIA RARE 06/16/2010    Pertinent Imaging: *** No results found for this or any previous visit.  No results found for this or any previous visit.  No results found for this or any previous visit.  No results found for this or any previous visit.  No results found for this or any previous visit.  No valid procedures specified. No results found for this or any previous visit.  No results found for this or any previous visit.   Assessment & Plan:    1. Rectal fistula Resolved. Followup prn   No follow-ups on file.  Wilkie Aye, MD  [facility] [place]

## 2023-03-03 ENCOUNTER — Telehealth: Payer: Self-pay | Admitting: Internal Medicine

## 2023-03-30 ENCOUNTER — Inpatient Hospital Stay: Payer: Medicaid Other | Attending: Hematology

## 2023-03-30 ENCOUNTER — Encounter (HOSPITAL_COMMUNITY)
Admission: RE | Admit: 2023-03-30 | Discharge: 2023-03-30 | Disposition: A | Discharge status: Home / Self Care | Payer: Medicaid Other | Source: Ambulatory Visit | Attending: Hematology | Admitting: Hematology

## 2023-03-30 DIAGNOSIS — C8190 Hodgkin lymphoma, unspecified, unspecified site: Secondary | ICD-10-CM | POA: Diagnosis not present

## 2023-03-30 DIAGNOSIS — C8108 Nodular lymphocyte predominant Hodgkin lymphoma, lymph nodes of multiple sites: Secondary | ICD-10-CM | POA: Diagnosis not present

## 2023-03-30 LAB — CBC WITH DIFFERENTIAL/PLATELET
Abs Immature Granulocytes: 0 10*3/uL (ref 0.00–0.07)
Basophils Absolute: 0 10*3/uL (ref 0.0–0.1)
Basophils Relative: 0 %
Eosinophils Absolute: 0.3 10*3/uL (ref 0.0–0.5)
Eosinophils Relative: 2 %
HCT: 45 % (ref 39.0–52.0)
Hemoglobin: 15.4 g/dL (ref 13.0–17.0)
Lymphocytes Relative: 53 %
Lymphs Abs: 7.5 10*3/uL — ABNORMAL HIGH (ref 0.7–4.0)
MCH: 33.6 pg (ref 26.0–34.0)
MCHC: 34.2 g/dL (ref 30.0–36.0)
MCV: 98 fL (ref 80.0–100.0)
Monocytes Absolute: 0.9 10*3/uL (ref 0.1–1.0)
Monocytes Relative: 6 %
Neutro Abs: 5.5 10*3/uL (ref 1.7–7.7)
Neutrophils Relative %: 39 %
Platelets: 324 10*3/uL (ref 150–400)
RBC: 4.59 MIL/uL (ref 4.22–5.81)
RDW: 14.6 % (ref 11.5–15.5)
WBC: 14.2 10*3/uL — ABNORMAL HIGH (ref 4.0–10.5)
nRBC: 0 % (ref 0.0–0.2)

## 2023-03-30 LAB — COMPREHENSIVE METABOLIC PANEL
ALT: 40 U/L (ref 0–44)
AST: 29 U/L (ref 15–41)
Albumin: 3.9 g/dL (ref 3.5–5.0)
Alkaline Phosphatase: 93 U/L (ref 38–126)
Anion gap: 6 (ref 5–15)
BUN: 8 mg/dL (ref 6–20)
CO2: 25 mmol/L (ref 22–32)
Calcium: 8.9 mg/dL (ref 8.9–10.3)
Chloride: 105 mmol/L (ref 98–111)
Creatinine, Ser: 0.85 mg/dL (ref 0.61–1.24)
GFR, Estimated: >60 mL/min (ref >60)
Glucose, Bld: 113 mg/dL — ABNORMAL HIGH (ref 70–99)
Potassium: 4.1 mmol/L (ref 3.5–5.1)
Sodium: 136 mmol/L (ref 135–145)
Total Bilirubin: 0.6 mg/dL (ref 0.3–1.2)
Total Protein: 6.9 g/dL (ref 6.5–8.1)

## 2023-03-30 LAB — LACTATE DEHYDROGENASE: LDH: 98 U/L (ref 98–192)

## 2023-03-30 LAB — SEDIMENTATION RATE: Sed Rate: 4 mm/hr (ref 0–16)

## 2023-03-30 MED ORDER — FLUDEOXYGLUCOSE F - 18 (FDG) INJECTION
15.0000 | Freq: Once | INTRAVENOUS | Status: AC | PRN
  Administered 2023-03-30: 13.98 via INTRAVENOUS

## 2023-04-06 ENCOUNTER — Inpatient Hospital Stay: Payer: Medicaid Other | Admitting: Hematology

## 2023-04-06 VITALS — BP 114/86 | HR 83 | Temp 98.5°F | Resp 18 | Ht 72.0 in | Wt 278.3 lb

## 2023-04-06 DIAGNOSIS — C8108 Nodular lymphocyte predominant Hodgkin lymphoma, lymph nodes of multiple sites: Secondary | ICD-10-CM | POA: Diagnosis not present

## 2023-04-06 NOTE — Progress Notes (Signed)
[facility] [address] [place] [address]    Clinic Day:  04/06/2023  Referring physician: Murriel Hopper Physic*  Patient Care Team: Billie Lade, MD as PCP - General (Internal Medicine) Jonelle Sidle, MD as PCP - Cardiology (Cardiology) Jonelle Sidle, MD as Consulting Physician (Cardiology) Mickie Bail, RN as Oncology Nurse Navigator   ASSESSMENT & PLAN:   Assessment: 1.  NLP Hodgkin's disease: -6 cycles of R-CHOP from 10/23/2019 through 02/04/2020. -PET scan on 03/25/2020 showed most of the lymph nodes with Deauville 2 activity with normal size limits.  2 of the right external iliac lymph nodes remain mildly enlarged.  Right thigh mass also normalized.    Plan: 1.  NLP Hodgkin's disease: - He denies any B symptoms. - He had perineal fistulotomy on 11/14/2022 which healed completely around March. - Reviewed PET scan from 03/30/2023: Stable cervical, axillary and upper abdominal lymphadenopathy.  Progressive pelvic lymphadenopathy, 2 cm left external iliac node, previously 1.7 cm with SUV 10.2.  Right external iliac node 2.1 cm, previously 1.8 cm with SUV 5.3, previously 2.0.  Clustered right inguinal lymph nodes measuring up to 1.3 cm short axis with SUV 5.9, previously 5.5. - Labs: Normal LFTs and LDH.  CBC grossly normal. 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  As he is feeling fine, I have recommended that we will repeat a PET scan in 4 months rather than going ahead with biopsy.  He is agreeable. - RTC 4 months with repeat labs and PET scan.    Orders Placed This Encounter  Procedures   NM PET Image Restag (PS) Skull Base To Thigh    Standing Status:   Future    Standing Expiration Date:   04/05/2024    Order Specific Question:   If indicated for the ordered procedure, I authorize the administration of a radiopharmaceutical per Radiology protocol    Answer:    Yes    Order Specific Question:   Preferred imaging location?    Answer:   [facility]    Order Specific Question:   Release to patient    Answer:   Immediate   CBC with Differential/Platelet    Standing Status:   Future    Standing Expiration Date:   04/05/2024    Order Specific Question:   Release to patient    Answer:   Immediate   Comprehensive metabolic panel    Standing Status:   Future    Standing Expiration Date:   04/05/2024    Order Specific Question:   Release to patient    Answer:   Immediate   Lactate dehydrogenase    Standing Status:   Future    Standing Expiration Date:   04/05/2024    Order Specific Question:   Release to patient    Answer:   Immediate   Sedimentation rate    Standing Status:   Future    Standing Expiration Date:   04/05/2024      I,Katie Daubenspeck,acting as a scribe for Doreatha Massed, MD.,have documented all relevant documentation on the behalf of Doreatha Massed, MD,as directed by  Doreatha Massed, MD while in the presence of Doreatha Massed, MD.   I, Doreatha Massed MD, have reviewed the above documentation for accuracy and completeness, and I agree with the above.   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 male presenting to clinic today for follow up of Hodgkin's lymphoma. He was last seen by me on 10/06/22.  Since his last visit, he underwent surveillance PET scan on 03/30/23 showing: progressive pelvic lymphadenopathy, including a dominant left external iliac node (Deauville 5); stable cervical, axillary, and upper abdominal lymphadenopathy (Deauville 3).  Today, he states that he is doing well overall. His appetite level is at 85%. His energy level is at 80%.  PAST MEDICAL HISTORY:   Past Medical History: Past Medical History:  Diagnosis Date   Asthma    Cancer (HCC)    stage 3 hodgkins lymphoma   Coronary atherosclerosis of native coronary artery    BMS to mid circumflex 11/2011, LVEF 55-60%   Essential hypertension    Migraine    Mixed hyperlipidemia    STEMI (ST elevation myocardial infarction) (HCC)    Complicated by VF arrest 11/2011    Surgical History: Past Surgical History:  Procedure Laterality Date   AXILLARY LYMPH NODE BIOPSY Left 07/26/2019   Procedure: AXILLARY LYMPH NODE BIOPSY;  Surgeon: Franky Macho, MD;  Location: [facility];  Service: General;  Laterality: Left;   BACK SURGERY     CARDIAC CATHETERIZATION     CHOLECYSTECTOMY N/A 08/26/2019   Procedure: LAPAROSCOPIC CHOLECYSTECTOMY;  Surgeon: Franky Macho, MD;  Location: [facility];  Service: General;  Laterality: N/A;   FISTULOTOMY N/A 11/14/2022   Procedure: FISTULOTOMY;  Surgeon: Franky Macho, MD;  Location: [facility];  Service: General;  Laterality: N/A;   INGUINAL LYMPH NODE BIOPSY Right 05/26/2021   Procedure: INGUINAL LYMPH NODE BIOPSY;  Surgeon: Franky Macho, MD;  Location: [facility];  Service: General;  Laterality: Right;   LEFT HEART CATHETERIZATION WITH CORONARY ANGIOGRAM N/A 11/21/2011   Procedure: LEFT HEART  CATHETERIZATION WITH CORONARY ANGIOGRAM;  Surgeon: Peter M Swaziland, MD;  Location: [facility] CATH LAB;  Service: Cardiovascular;  Laterality: N/A;   LYMPH NODE BIOPSY Right 08/26/2019   Procedure: LYMPH NODE BIOPSY, INGUINAL;  Surgeon: Franky Macho, MD;  Location: [facility];  Service: General;  Laterality: Right;   PERCUTANEOUS CORONARY STENT INTERVENTION (PCI-S) N/A 11/21/2011   Procedure: PERCUTANEOUS CORONARY STENT INTERVENTION (PCI-S);  Surgeon: Peter M Swaziland, MD;  Location: [facility] CATH LAB;  Service: Cardiovascular;  Laterality: N/A;   PORT-A-CATH REMOVAL N/A 12/27/2021   Procedure: MINOR REMOVAL PORT-A-CATH;  Surgeon: Franky Macho, MD;  Location: [facility];  Service: General;  Laterality: N/A;   PORTACATH PLACEMENT Left 10/01/2019   Procedure: INSERTION PORT-A-CATH (catheter attached left subclavian);  Surgeon: Franky Macho, MD;  Location: [facility];  Service: General;  Laterality: Left;  SPLENECTOMY      Social History: Social History   Socioeconomic History   Marital status: Divorced    Spouse name: Not on file   Number of children: 2   Years of education: Not on file   Highest education level: Not on file  Occupational History   Occupation: not employed  Tobacco Use   Smoking status: Every Day    Packs/day: 1.00    Years: 20.00    Additional pack years: 0.00    Total pack years: 20.00    Types: Cigarettes   Smokeless tobacco: Never  Vaping Use   Vaping Use: Former   Start date: 04/27/2012  Substance and Sexual Activity   Alcohol use: Yes    Comment: Occasionally   Drug use: No   Sexual activity: Yes    Birth control/protection: Rhythm  Other Topics Concern   Not on file  Social History Narrative   Not on file   Social Determinants of Health   Financial Resource Strain: Low Risk  (09/23/2020)   Overall Financial Resource Strain (CARDIA)    Difficulty of Paying Living Expenses: Not hard at all  Food Insecurity: No Food Insecurity (09/23/2020)   Hunger Vital Sign    Worried About  Running Out of Food in the Last Year: Never true    Ran Out of Food in the Last Year: Never true  Transportation Needs: No Transportation Needs (09/23/2020)   PRAPARE - Administrator, Civil Service (Medical): No    Lack of Transportation (Non-Medical): No  Physical Activity: Unknown (06/27/2019)   Exercise Vital Sign    Days of Exercise per Week: 3 days    Minutes of Exercise per Session: Not on file  Recent Concern: Physical Activity - Insufficiently Active (06/15/2019)   Exercise Vital Sign    Days of Exercise per Week: 2 days    Minutes of Exercise per Session: 30 min  Stress: No Stress Concern Present (09/23/2020)   [employer] of Occupational Health - Occupational Stress Questionnaire    Feeling of Stress : Not at all  Social Connections: Socially Isolated (09/23/2020)   Social Connection and Isolation Panel [NHANES]    Frequency of Communication with Friends and Family: More than three times a week    Frequency of Social Gatherings with Friends and Family: Three times a week    Attends Religious Services: Never    Active Member of Clubs or Organizations: No    Attends Banker Meetings: Never    Marital Status: Divorced  Catering manager Violence: Not At Risk (09/23/2020)   Humiliation, Afraid, Rape, and Kick questionnaire    Fear of Current or Ex-Partner: No    Emotionally Abused: No    Physically Abused: No    Sexually Abused: No    Family History: Family History  Problem Relation Age of Onset   Diabetes Mother    Malignant hyperthermia Mother    Cancer Mother        throat cancer   Heart disease Father    Cancer Father    Malignant hyperthermia Brother    Heart disease Brother    Asthma Brother    COPD Brother    ADD / ADHD Daughter    ADD / ADHD Son     Current Medications:  Current Outpatient Medications:    albuterol (PROVENTIL HFA;VENTOLIN HFA) 108 (90 Base) MCG/ACT inhaler, Inhale 2 puffs into the lungs every 6 (six) hours as  needed for wheezing or shortness  of breath., Disp: , Rfl:    Cholecalciferol 1.25 MG (50000 UT) capsule, Take 1 capsule (50,000 Units total) by mouth once a week., Disp: 8 capsule, Rfl: 0   hydrOXYzine (VISTARIL) 50 MG capsule, Take 1 capsule (50 mg total) by mouth at bedtime as needed., Disp: 30 capsule, Rfl: 0   ibuprofen (ADVIL) 200 MG tablet, Take 800 mg by mouth every 8 (eight) hours as needed (headache)., Disp: , Rfl:    lidocaine-prilocaine (EMLA) cream, Apply 1 application. topically once. Prior to procedure., Disp: , Rfl:    Allergies: Allergies  Allergen Reactions   Penicillins Anaphylaxis    Did it involve swelling of the face/tongue/throat, SOB, or low BP? Yes Did it involve sudden or severe rash/hives, skin peeling, or any reaction on the inside of your mouth or nose? No Did you need to seek medical attention at a hospital or doctor's office? Yes When did it last happen?  Childhood allergy     If all above answers are "NO", may proceed with cephalosporin use.     REVIEW OF SYSTEMS:   Review of Systems  Constitutional:  Negative for chills, fatigue and fever.  HENT:   Negative for lump/mass, mouth sores, nosebleeds, sore throat and trouble swallowing.   Eyes:  Negative for eye problems.  Respiratory:  Positive for shortness of breath. Negative for cough.   Cardiovascular:  Negative for chest pain, leg swelling and palpitations.  Gastrointestinal:  Negative for abdominal pain, constipation, diarrhea, nausea and vomiting.  Genitourinary:  Negative for bladder incontinence, difficulty urinating, dysuria, frequency, hematuria and nocturia.   Musculoskeletal:  Negative for arthralgias, back pain, flank pain, myalgias and neck pain.  Skin:  Negative for itching and rash.  Neurological:  Negative for dizziness, headaches and numbness.  Hematological:  Does not bruise/bleed easily.  Psychiatric/Behavioral:  Negative for depression, sleep disturbance and suicidal ideas. The patient  is not nervous/anxious.   All other systems reviewed and are negative.    VITALS:   Blood pressure 114/86, pulse 83, temperature 98.5 F (36.9 C), temperature source Tympanic, resp. rate 18, height 6' (1.829 m), weight 278 lb 4.8 oz (126.2 kg), SpO2 96 %.  Wt Readings from Last 3 Encounters:  04/06/23 278 lb 4.8 oz (126.2 kg)  12/13/22 271 lb 1.9 oz (123 kg)  11/28/22 275 lb (124.7 kg)    Body mass index is 37.74 kg/m.  Performance status (ECOG): 1 - Symptomatic but completely ambulatory  PHYSICAL EXAM:   Physical Exam Vitals and nursing note reviewed. Exam conducted with a chaperone present.  Constitutional:      Appearance: Normal appearance.  Cardiovascular:     Rate and Rhythm: Normal rate and regular rhythm.     Pulses: Normal pulses.     Heart sounds: Normal heart sounds.  Pulmonary:     Effort: Pulmonary effort is normal.     Breath sounds: Normal breath sounds.  Abdominal:     Palpations: Abdomen is soft. There is no hepatomegaly, splenomegaly or mass.     Tenderness: There is no abdominal tenderness.  Musculoskeletal:     Right lower leg: No edema.     Left lower leg: No edema.  Lymphadenopathy:     Cervical: No cervical adenopathy.     Right cervical: No superficial, deep or posterior cervical adenopathy.    Left cervical: No superficial, deep or posterior cervical adenopathy.     Upper Body:     Right upper body: No supraclavicular or axillary adenopathy.  Left upper body: No supraclavicular or axillary adenopathy.  Neurological:     General: No focal deficit present.     Mental Status: He is alert and oriented to person, place, and time.  Psychiatric:        Mood and Affect: Mood normal.        Behavior: Behavior normal.     LABS:      Latest Ref Rng & Units 03/30/2023   10:25 AM 09/29/2022   10:51 AM 09/25/2022    9:13 PM  CBC  WBC 4.0 - 10.5 K/uL 14.2  11.0  13.2   Hemoglobin 13.0 - 17.0 g/dL 16.1  09.6  04.5   Hematocrit 39.0 - 52.0 %  45.0  46.7  43.2   Platelets 150 - 400 K/uL 324  334  321       Latest Ref Rng & Units 03/30/2023   10:25 AM 09/29/2022   10:51 AM 09/25/2022    9:13 PM  CMP  Glucose 70 - 99 mg/dL 409  811  914   BUN 6 - 20 mg/dL 8  8  7    Creatinine 0.61 - 1.24 mg/dL 7.82  9.56  2.13   Sodium 135 - 145 mmol/L 136  136  138   Potassium 3.5 - 5.1 mmol/L 4.1  4.3  3.7   Chloride 98 - 111 mmol/L 105  108  107   CO2 22 - 32 mmol/L 25  23  23    Calcium 8.9 - 10.3 mg/dL 8.9  9.2  8.8   Total Protein 6.5 - 8.1 g/dL 6.9  7.3    Total Bilirubin 0.3 - 1.2 mg/dL 0.6  0.6    Alkaline Phos 38 - 126 U/L 93  102    AST 15 - 41 U/L 29  25    ALT 0 - 44 U/L 40  30       No results found for: "CEA1", "CEA" / No results found for: "CEA1", "CEA" No results found for: "PSA1" No results found for: "YQM578" No results found for: "CAN125"  No results found for: "TOTALPROTELP", "ALBUMINELP", "A1GS", "A2GS", "BETS", "BETA2SER", "GAMS", "MSPIKE", "SPEI" No results found for: "TIBC", "FERRITIN", "IRONPCTSAT" Lab Results  Component Value Date   LDH 98 03/30/2023   LDH 97 (L) 09/29/2022   LDH 106 06/02/2022     STUDIES:   NM PET Image Restag (PS) Skull Base To Thigh  Result Date: 04/06/2023 CLINICAL DATA:  Subsequent treatment strategy for Hodgkin's lymphoma. EXAM: NUCLEAR MEDICINE PET SKULL BASE TO THIGH TECHNIQUE: 14.0 mCi F-18 FDG was injected intravenously. Full-ring PET imaging was performed from the skull base to thigh after the radiotracer. CT data was obtained and used for attenuation correction and anatomic localization. Fasting blood glucose: 108 mg/dl COMPARISON:  PET-CT dated 09/29/2022 FINDINGS: Mediastinal blood pool activity: SUV max 2.7 Liver activity: SUV max 5.2 NECK: Small bilateral cervical nodes, including a dominant 8 mm short axis left level 1B node (series 3/image 37), max SUV 3.7. Appearance is grossly unchanged, previously with max SUV 3.5 Incidental CT findings: None. CHEST: No hypermetabolic  pulmonary nodules. No hypermetabolic mediastinal lymphadenopathy. Status post left axillary lymph node dissection. Left subpectoral and axillary nodes measuring up to 15 mm short axis (series 3/image 86), max SUV 4.4. Additional 11 mm short axis left inferior axillary node (series 3/image 93), max SUV 4.8. Appearance is grossly unchanged, previously with max SUV 3.5. Small right axillary nodes measuring up to 8 mm short axis (series 3/image 39),  max SUV 2.7. Incidental CT findings: None. ABDOMEN/PELVIS: Status post splenectomy. No abnormal hypermetabolism in the liver, pancreas, or adrenal glands. Small upper abdominal nodes, including a dominant 13 mm short axis gastrohepatic node (series 3/image 148) with max SUV 2.5. Small retroperitoneal nodes, including an 8 mm short axis left para-aortic node (series 3/image 25) with max SUV 3.9. Progressive pelvic lymphadenopathy, including a 2.0 cm short axis left external iliac node (series 3/image 260), previously 17 mm, now with max SUV 10.2. Dominant 2.1 cm right external iliac node, previously 1.8 cm, now with max SUV 5.3, previously 2.0. Clustered right inguinal nodes measuring up to 13 mm short axis (series 3/image 261) with max SUV 5.9, previously 5.5. Incidental CT findings: Status post cholecystectomy. Thick-walled bladder, although underdistended. SKELETON: No focal hypermetabolic activity to suggest skeletal metastasis. Incidental CT findings: Mild degenerative changes of the lower thoracic spine. Postsurgical changes in the lower lumbar spine. IMPRESSION: Progressive pelvic lymphadenopathy, as above, including a dominant left external iliac node. Deauville 5. Stable cervical, axillary, and upper abdominal lymphadenopathy. Deauville 3. 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 Electronically Signed   By: Charline Bills M.D.   On: 04/06/2023 01:39

## 2023-04-06 NOTE — Patient Instructions (Signed)
[facility]  Discharge Instructions  You were seen and examined today by Dr. Ellin Saba.  Dr. Ellin Saba discussed your most recent lab work and CT scan which revealed that your labs look good. The scan shows some swollen lymph nodes but this could be coming from where that you had the boil drained.  Dr. Ellin Saba will repeat PET scan before your next appointment.  Follow-up as scheduled in 4 months.    Thank you for choosing [facility] to provide your oncology and hematology care.   To afford each patient quality time with our provider, please arrive at least 15 minutes before your scheduled appointment time. You may need to reschedule your appointment if you arrive late (10 or more minutes). Arriving late affects you and other patients whose appointments are after yours.  Also, if you miss three or more appointments without notifying the office, you may be dismissed from the clinic at the provider's discretion.    Again, thank you for choosing [facility].  Our hope is that these requests will decrease the amount of time that you wait before being seen by our physicians.   If you have a lab appointment with the Cancer Center - please note that after April 8th, all labs will be drawn in the cancer center.  You do not have to check in or register with the main entrance as you have in the past but will complete your check-in at the cancer center.            _____________________________________________________________  Should you have questions after your visit to [facility], please contact our office at [phone] and follow the prompts.  Our office hours are 8:00 a.m. to 4:30 p.m. Monday - Thursday and 8:00 a.m. to 2:30 p.m. Friday.  Please note that voicemails left after 4:00 p.m. may not be returned until the following business day.  We are closed weekends and all major holidays.  You do have access to a  nurse 24-7, just call the main number to the clinic [phone] and do not press any options, hold on the line and a nurse will answer the phone.    For prescription refill requests, have your pharmacy contact our office and allow 72 hours.    Masks are no longer required in the cancer centers. If you would like for your care team to wear a mask while they are taking care of you, please let them know. You may have one support person who is at least 40 years old accompany you for your appointments.

## 2023-04-13 NOTE — Telephone Encounter (Signed)
ED Follow Up Outreach  Initial Outreach Documentation  What specific health condition(s) do you have that needs to be monitored by a doctor? Patient reports heart condition   What are you currently doing to manage your health condition(s)? Patient reports I am making sure you have meds. refilled  Some of the symptoms that were reported during your emergency room visits could be managed by a primary care provider. When considering your emergency room visits, did you call your primary care doctor to report your symptoms or illness before going to the emergency room?  Patient reports yes.I am switching doctors.   Do you have access to resources or alternative options to help you avoid going to the ED unless is an emergency? If not, what resources or alternative options do you feel you need?  Patient reports yes  Is there something that you would like to learn more about: Stress Management Anxiety management Activity Tracking Mood/Depression Monitoring blood pressure/ weight /diet Physical activity / Exercise  Patient reports No, like I said before I am in the process of getting switch with [facility], I said I can help you do that, I can help getting you an appo  When is a good time to call you back in the next 2 weeks? During this call we will review the resources you need and discuss when to call your primary care doctor for health issues.

## 2023-05-09 ENCOUNTER — Encounter: Payer: Self-pay | Admitting: Internal Medicine

## 2023-05-09 ENCOUNTER — Ambulatory Visit: Payer: Medicaid Other | Admitting: Internal Medicine

## 2023-05-09 VITALS — BP 112/76 | HR 100 | Ht 71.0 in | Wt 280.0 lb

## 2023-05-09 DIAGNOSIS — C8100 Nodular lymphocyte predominant Hodgkin lymphoma, unspecified site: Secondary | ICD-10-CM | POA: Diagnosis not present

## 2023-05-09 DIAGNOSIS — J4531 Mild persistent asthma with (acute) exacerbation: Secondary | ICD-10-CM

## 2023-05-09 DIAGNOSIS — Z720 Tobacco use: Secondary | ICD-10-CM | POA: Diagnosis not present

## 2023-05-09 DIAGNOSIS — E559 Vitamin D deficiency, unspecified: Secondary | ICD-10-CM | POA: Diagnosis not present

## 2023-05-09 DIAGNOSIS — E782 Mixed hyperlipidemia: Secondary | ICD-10-CM | POA: Diagnosis not present

## 2023-05-09 DIAGNOSIS — I2510 Atherosclerotic heart disease of native coronary artery without angina pectoris: Secondary | ICD-10-CM

## 2023-05-09 MED ORDER — ATORVASTATIN CALCIUM 40 MG PO TABS
40.0000 mg | ORAL_TABLET | Freq: Every day | ORAL | 3 refills | Status: AC
Start: 2023-05-09 — End: ?

## 2023-05-09 NOTE — Assessment & Plan Note (Signed)
Nodular lymphocyte predominant Hodgkin's lymphoma.  Followed by oncology.  He was seen for follow-up last month.  He will undergo repeat PET scan in October.

## 2023-05-09 NOTE — Assessment & Plan Note (Signed)
Noted on previous labs.  He has completed high-dose, weekly vitamin D supplementation.  Repeat vitamin D level has been ordered today.

## 2023-05-09 NOTE — Progress Notes (Signed)
Established Patient Office Visit  Subjective   Patient ID: Nathan Waters, male    DOB: Mar 10, 1981  Age: 42 y.o. MRN: [id number]  Chief Complaint  Patient presents with   vitamin d deficiency     Follow up   Nathan Waters returns to care today for routine follow-up.  He was last evaluated by me through virtual encounter on 3/26.  No medication changes were made at that time and 11-month follow-up was arranged.  In the interim, he has been seen by urology oncology for follow-up.  There have otherwise been no acute interval events.  Nathan Waters reports feeling fairly well today.  He has no acute concerns to discuss.  He endorses chronic back pain.  Has a history of L4-5 laminectomy in 2011 and plans to contact his surgeon to schedule follow-up.  Past Medical History:  Diagnosis Date   Asthma    Cancer (HCC)    stage 3 hodgkins lymphoma   Coronary atherosclerosis of native coronary artery    BMS to mid circumflex 11/2011, LVEF 55-60%   Essential hypertension    Migraine    Mixed hyperlipidemia    STEMI (ST elevation myocardial infarction) (HCC)    Complicated by VF arrest 11/2011   Past Surgical History:  Procedure Laterality Date   AXILLARY LYMPH NODE BIOPSY Left 07/26/2019   Procedure: AXILLARY LYMPH NODE BIOPSY;  Surgeon: Franky Macho, MD;  Location: [facility];  Service: General;  Laterality: Left;   BACK SURGERY     CARDIAC CATHETERIZATION     CHOLECYSTECTOMY N/A 08/26/2019   Procedure: LAPAROSCOPIC CHOLECYSTECTOMY;  Surgeon: Franky Macho, MD;  Location: [facility];  Service: General;  Laterality: N/A;   FISTULOTOMY N/A 11/14/2022   Procedure: FISTULOTOMY;  Surgeon: Franky Macho, MD;  Location: [facility];  Service: General;  Laterality: N/A;   INGUINAL LYMPH NODE BIOPSY Right 05/26/2021   Procedure: INGUINAL LYMPH NODE BIOPSY;  Surgeon: Franky Macho, MD;  Location: [facility];  Service: General;  Laterality: Right;   LEFT HEART CATHETERIZATION WITH CORONARY ANGIOGRAM N/A 11/21/2011   Procedure:  LEFT HEART CATHETERIZATION WITH CORONARY ANGIOGRAM;  Surgeon: Peter M [place], MD;  Location: [facility] CATH LAB;  Service: Cardiovascular;  Laterality: N/A;   LYMPH NODE BIOPSY Right 08/26/2019   Procedure: LYMPH NODE BIOPSY, INGUINAL;  Surgeon: Franky Macho, MD;  Location: [facility];  Service: General;  Laterality: Right;   PERCUTANEOUS CORONARY STENT INTERVENTION (PCI-S) N/A 11/21/2011   Procedure: PERCUTANEOUS CORONARY STENT INTERVENTION (PCI-S);  Surgeon: Peter M [place], MD;  Location: [facility] CATH LAB;  Service: Cardiovascular;  Laterality: N/A;   PORT-A-CATH REMOVAL N/A 12/27/2021   Procedure: MINOR REMOVAL PORT-A-CATH;  Surgeon: Franky Macho, MD;  Location: [facility];  Service: General;  Laterality: N/A;   PORTACATH PLACEMENT Left 10/01/2019   Procedure: INSERTION PORT-A-CATH (catheter attached left subclavian);  Surgeon: Franky Macho, MD;  Location: [facility];  Service: General;  Laterality: Left;   SPLENECTOMY     Social History   Tobacco Use   Smoking status: Every Day    Current packs/day: 1.00    Average packs/day: 1 pack/day for 20.0 years (20.0 ttl pk-yrs)    Types: Cigarettes   Smokeless tobacco: Never  Vaping Use   Vaping status: Former   Start date: 04/27/2012  Substance Use Topics   Alcohol use: Yes    Comment: Occasionally   Drug use: No   Family History  Problem Relation Age of Onset   Diabetes Mother    Malignant hyperthermia Mother  Cancer Mother        throat cancer   Heart disease Father    Cancer Father    Malignant hyperthermia Brother    Heart disease Brother    Asthma Brother    COPD Brother    ADD / ADHD Daughter    ADD / ADHD Son    Allergies  Allergen Reactions   Penicillins Anaphylaxis    Did it involve swelling of the face/tongue/throat, SOB, or low BP? Yes Did it involve sudden or severe rash/hives, skin peeling, or any reaction on the inside of your mouth or nose? No Did you need to seek medical attention at a hospital or doctor's office? Yes When did it  last happen?  Childhood allergy     If all above answers are "NO", may proceed with cephalosporin use.    Review of Systems  Musculoskeletal:  Positive for back pain (Chronic).  All other systems reviewed and are negative.    Objective:     BP 112/76   Pulse 100   Ht 5\' 11"  (1.803 m)   Wt 280 lb (127 kg)   SpO2 91%   BMI 39.05 kg/m  BP Readings from Last 3 Encounters:  05/09/23 112/76  04/06/23 114/86  02/10/23 117/79   Physical Exam Vitals reviewed.  Constitutional:      General: He is not in acute distress.    Appearance: Normal appearance. He is obese. He is not ill-appearing.  HENT:     Head: Normocephalic and atraumatic.     Right Ear: External ear normal.     Left Ear: External ear normal.     Nose: Nose normal. No congestion or rhinorrhea.     Mouth/Throat:     Mouth: Mucous membranes are moist.     Pharynx: Oropharynx is clear.  Eyes:     General: No scleral icterus.    Extraocular Movements: Extraocular movements intact.     Conjunctiva/sclera: Conjunctivae normal.     Pupils: Pupils are equal, round, and reactive to light.  Cardiovascular:     Rate and Rhythm: Normal rate and regular rhythm.     Pulses: Normal pulses.     Heart sounds: Normal heart sounds. No murmur heard. Pulmonary:     Effort: Pulmonary effort is normal.     Breath sounds: Normal breath sounds. No wheezing, rhonchi or rales.  Abdominal:     General: Abdomen is flat. Bowel sounds are normal. There is no distension.     Palpations: Abdomen is soft.     Tenderness: There is no abdominal tenderness.  Musculoskeletal:        General: No swelling or deformity. Normal range of motion.     Cervical back: Normal range of motion.  Skin:    General: Skin is warm and dry.     Capillary Refill: Capillary refill takes less than 2 seconds.     Findings: Lesion (Telangiectasias on nose) present.  Neurological:     General: No focal deficit present.     Mental Status: He is alert and oriented  to person, place, and time.     Motor: No weakness.  Psychiatric:        Mood and Affect: Mood normal.        Behavior: Behavior normal.        Thought Content: Thought content normal.   Last CBC Lab Results  Component Value Date   WBC 14.2 (H) 03/30/2023   HGB 15.4 03/30/2023   HCT 45.0  03/30/2023   MCV 98.0 03/30/2023   MCH 33.6 03/30/2023   RDW 14.6 03/30/2023   PLT 324 03/30/2023   Last metabolic panel Lab Results  Component Value Date   GLUCOSE 113 (H) 03/30/2023   NA 136 03/30/2023   K 4.1 03/30/2023   CL 105 03/30/2023   CO2 25 03/30/2023   BUN 8 03/30/2023   CREATININE 0.85 03/30/2023   GFRNONAA >60 03/30/2023   CALCIUM 8.9 03/30/2023   PHOS 2.1 (L) 06/02/2022   PROT 6.9 03/30/2023   ALBUMIN 3.9 03/30/2023   BILITOT 0.6 03/30/2023   ALKPHOS 93 03/30/2023   AST 29 03/30/2023   ALT 40 03/30/2023   ANIONGAP 6 03/30/2023   Last lipids Lab Results  Component Value Date   CHOL 176 12/13/2022   HDL 34 (L) 12/13/2022   LDLCALC 121 (H) 12/13/2022   TRIG 112 12/13/2022   CHOLHDL 5.2 (H) 12/13/2022   Last hemoglobin A1c Lab Results  Component Value Date   HGBA1C 6.1 (H) 12/13/2022   Last thyroid functions Lab Results  Component Value Date   TSH 1.120 12/13/2022   Last vitamin D Lab Results  Component Value Date   VD25OH <4.0 (L) 12/13/2022     Assessment & Plan:   Problem List Items Addressed This Visit       Coronary atherosclerosis of native coronary artery    Denies recent chest pain.  History of CAD with BMS to mid circumflex in 2013.  Currently taking ASA 81 mg daily, but is not on statin therapy. -Through shared decision-making, atorvastatin 40 mg daily has been started today      Mild persistent asthma with acute exacerbation    Currently.  Pulmonary exam is unremarkable.  Seldom requires his albuterol inhaler.      Mixed hyperlipidemia    Lipid panel updated in February.  Total cholesterol 176 and LDL 121.  History of CAD with STEMI  in 2013.  Not currently on statin therapy. -Start atorvastatin 40 mg daily      Tobacco use    Continues to work on smoking cessation.  Has been vaping instead.  He reports that stress leads him to start smoking.  We discussed the use of Nicorette gum as an alternative to smoking. -The patient was counseled on the dangers of tobacco use, and was advised to quit.  Reviewed strategies to maximize success, including removing cigarettes and smoking materials from environment, stress management, substitution of other forms of reinforcement, support of family/friends, and written materials.       Hodgkin lymphoma (HCC)    Nodular lymphocyte predominant Hodgkin's lymphoma.  Followed by oncology.  He was seen for follow-up last month.  He will undergo repeat PET scan in October.      Vitamin D deficiency - Primary    Noted on previous labs.  He has completed high-dose, weekly vitamin D supplementation.  Repeat vitamin D level has been ordered today.      Return in about 6 months (around 11/09/2023).   Billie Lade, MD

## 2023-05-09 NOTE — Patient Instructions (Addendum)
It was a pleasure to see you today.  Thank you for giving [place] the opportunity to be involved in your care.  Below is a brief recap of your visit and next steps.  We will plan to see you again in 6 months.  Summary Repeat vitamin D level today Start atorvastatin 40 mg daily No additional medication changes Follow up in 6 months  [phone] - phone number for [facility]. Dr. Lovell Sheehan performed your surgery in 2011.

## 2023-05-09 NOTE — Assessment & Plan Note (Signed)
Lipid panel updated in February.  Total cholesterol 176 and LDL 121.  History of CAD with STEMI in 2013.  Not currently on statin therapy. -Start atorvastatin 40 mg daily

## 2023-05-09 NOTE — Assessment & Plan Note (Signed)
Continues to work on smoking cessation.  Has been vaping instead.  He reports that stress leads him to start smoking.  We discussed the use of Nicorette gum as an alternative to smoking. -The patient was counseled on the dangers of tobacco use, and was advised to quit.  Reviewed strategies to maximize success, including removing cigarettes and smoking materials from environment, stress management, substitution of other forms of reinforcement, support of family/friends, and written materials.

## 2023-05-09 NOTE — Assessment & Plan Note (Signed)
Currently.  Pulmonary exam is unremarkable.  Seldom requires his albuterol inhaler.

## 2023-05-09 NOTE — Assessment & Plan Note (Signed)
Denies recent chest pain.  History of CAD with BMS to mid circumflex in 2013.  Currently taking ASA 81 mg daily, but is not on statin therapy. -Through shared decision-making, atorvastatin 40 mg daily has been started today

## 2023-05-10 LAB — VITAMIN D 25 HYDROXY (VIT D DEFICIENCY, FRACTURES): Vit D, 25-Hydroxy: 22.7 ng/mL — ABNORMAL LOW (ref 30.0–100.0)

## 2023-05-15 ENCOUNTER — Other Ambulatory Visit: Payer: Self-pay | Admitting: Internal Medicine

## 2023-05-15 DIAGNOSIS — F5101 Primary insomnia: Secondary | ICD-10-CM

## 2023-05-15 MED ORDER — HYDROXYZINE PAMOATE 50 MG PO CAPS
50.0000 mg | ORAL_CAPSULE | Freq: Every evening | ORAL | 0 refills | Status: DC | PRN
Start: 2023-05-15 — End: 2023-06-14

## 2023-05-29 ENCOUNTER — Emergency Department (HOSPITAL_COMMUNITY): Payer: Medicaid Other

## 2023-05-29 ENCOUNTER — Encounter (HOSPITAL_COMMUNITY): Payer: Self-pay

## 2023-05-29 ENCOUNTER — Other Ambulatory Visit: Payer: Self-pay

## 2023-05-29 ENCOUNTER — Emergency Department (HOSPITAL_COMMUNITY): Admission: EM | Admit: 2023-05-29 | Payer: Medicaid Other | Source: Home / Self Care

## 2023-05-29 DIAGNOSIS — I2510 Atherosclerotic heart disease of native coronary artery without angina pectoris: Secondary | ICD-10-CM | POA: Insufficient documentation

## 2023-05-29 DIAGNOSIS — Z8571 Personal history of Hodgkin lymphoma: Secondary | ICD-10-CM | POA: Insufficient documentation

## 2023-05-29 DIAGNOSIS — R1011 Right upper quadrant pain: Secondary | ICD-10-CM | POA: Diagnosis not present

## 2023-05-29 DIAGNOSIS — I10 Essential (primary) hypertension: Secondary | ICD-10-CM | POA: Insufficient documentation

## 2023-05-29 DIAGNOSIS — J45909 Unspecified asthma, uncomplicated: Secondary | ICD-10-CM | POA: Insufficient documentation

## 2023-05-29 DIAGNOSIS — F17200 Nicotine dependence, unspecified, uncomplicated: Secondary | ICD-10-CM | POA: Diagnosis not present

## 2023-05-29 DIAGNOSIS — R0789 Other chest pain: Secondary | ICD-10-CM | POA: Diagnosis not present

## 2023-05-29 LAB — COMPREHENSIVE METABOLIC PANEL
ALT: 40 U/L (ref 0–44)
AST: 32 U/L (ref 15–41)
Albumin: 3.8 g/dL (ref 3.5–5.0)
Alkaline Phosphatase: 101 U/L (ref 38–126)
Anion gap: 8 (ref 5–15)
BUN: 8 mg/dL (ref 6–20)
CO2: 24 mmol/L (ref 22–32)
Calcium: 9 mg/dL (ref 8.9–10.3)
Chloride: 107 mmol/L (ref 98–111)
Creatinine, Ser: 0.92 mg/dL (ref 0.61–1.24)
GFR, Estimated: >60 mL/min (ref >60)
Glucose, Bld: 165 mg/dL — ABNORMAL HIGH (ref 70–99)
Potassium: 4 mmol/L (ref 3.5–5.1)
Sodium: 139 mmol/L (ref 135–145)
Total Bilirubin: 0.9 mg/dL (ref 0.3–1.2)
Total Protein: 6.7 g/dL (ref 6.5–8.1)

## 2023-05-29 LAB — CBC
HCT: 42.4 % (ref 39.0–52.0)
Hemoglobin: 14.5 g/dL (ref 13.0–17.0)
MCH: 33.6 pg (ref 26.0–34.0)
MCHC: 34.2 g/dL (ref 30.0–36.0)
MCV: 98.4 fL (ref 80.0–100.0)
Platelets: 302 10*3/uL (ref 150–400)
RBC: 4.31 MIL/uL (ref 4.22–5.81)
RDW: 13.8 % (ref 11.5–15.5)
WBC: 11.5 10*3/uL — ABNORMAL HIGH (ref 4.0–10.5)
nRBC: 0 % (ref 0.0–0.2)

## 2023-05-29 LAB — LIPASE, BLOOD: Lipase: 26 U/L (ref 11–51)

## 2023-05-29 MED ORDER — KETOROLAC TROMETHAMINE 30 MG/ML IJ SOLN
30.0000 mg | Freq: Once | INTRAMUSCULAR | Status: AC
  Administered 2023-05-29: 30 mg via INTRAMUSCULAR
  Filled 2023-05-29: qty 1

## 2023-05-29 NOTE — ED Triage Notes (Signed)
Pt reports RUQ pain radiating into right shoulder.  Denies any N/V.

## 2023-05-29 NOTE — ED Provider Notes (Signed)
[place] EMERGENCY DEPARTMENT AT [facility] Provider Note   CSN: [id number] Arrival date & time: 05/29/23  2137     History {Add pertinent medical, surgical, social history, OB history to HPI:1} Chief Complaint  Patient presents with   Abdominal Pain    Nathan Waters is a 42 y.o. male.  HPI     This is a 42 year old male with a history of coronary artery disease who presents with right upper quadrant and right lower chest pain.  Patient reports that his wife was stepping on his back on Friday night when he felt a pop in his right lower chest.  Since that time he has had ongoing pain.  Pain is worse with breathing.  It radiates to his right back.  He has not had any shortness of breath.  He is a current smoker.  No fevers or cough.  Denies any abdominal pain or pain with eating.  Home Medications Prior to Admission medications   Medication Sig Start Date End Date Taking? Authorizing Provider  albuterol (PROVENTIL HFA;VENTOLIN HFA) 108 (90 Base) MCG/ACT inhaler Inhale 2 puffs into the lungs every 6 (six) hours as needed for wheezing or shortness of breath.    [provider]  atorvastatin (LIPITOR) 40 MG tablet Take 1 tablet (40 mg total) by mouth daily. 05/09/23   Billie Lade, MD  Cholecalciferol 1.25 MG (50000 UT) capsule Take 1 capsule (50,000 Units total) by mouth once a week. 12/16/22   Gardenia Phlegm, MD  hydrOXYzine (VISTARIL) 50 MG capsule Take 1 capsule (50 mg total) by mouth at bedtime as needed. 05/15/23   Billie Lade, MD  ibuprofen (ADVIL) 200 MG tablet Take 800 mg by mouth every 8 (eight) hours as needed (headache).    [provider]  lidocaine-prilocaine (EMLA) cream Apply 1 application. topically once. Prior to procedure.    [provider]      Allergies    Penicillins    Review of Systems   Review of Systems  Constitutional:  Negative for fever.  Respiratory:  Negative for shortness of breath.   Cardiovascular:   Positive for chest pain.  All other systems reviewed and are negative.   Physical Exam Updated Vital Signs BP 114/76 (BP Location: Right Arm)   Pulse 77   Temp 98.6 F (37 C) (Oral)   Resp 18   Ht 1.829 m (6')   Wt 127 kg   SpO2 94%   BMI 37.97 kg/m  Physical Exam Vitals and nursing note reviewed.  Constitutional:      Appearance: He is well-developed. He is obese. He is not ill-appearing.  HENT:     Head: Normocephalic and atraumatic.  Eyes:     Pupils: Pupils are equal, round, and reactive to light.  Cardiovascular:     Rate and Rhythm: Normal rate and regular rhythm.     Heart sounds: Normal heart sounds. No murmur heard. Pulmonary:     Effort: Pulmonary effort is normal. No respiratory distress.     Breath sounds: Normal breath sounds. No wheezing.     Comments: Tenderness to palpation right lower chest wall, no overlying skin changes or crepitus Chest:     Chest wall: Tenderness present.  Abdominal:     General: Bowel sounds are normal.     Palpations: Abdomen is soft.     Tenderness: There is no abdominal tenderness. There is no rebound.  Musculoskeletal:     Cervical back: Neck supple.  Lymphadenopathy:     Cervical: No cervical adenopathy.  Skin:    General: Skin is warm and dry.  Neurological:     Mental Status: He is alert and oriented to person, place, and time.  Psychiatric:        Mood and Affect: Mood normal.     ED Results / Procedures / Treatments   Labs (all labs ordered are listed, but only abnormal results are displayed) Labs Reviewed  COMPREHENSIVE METABOLIC PANEL - Abnormal; Notable for the following components:      Result Value   Glucose, Bld 165 (*)    All other components within normal limits  CBC - Abnormal; Notable for the following components:   WBC 11.5 (*)    All other components within normal limits  LIPASE, BLOOD  URINALYSIS, ROUTINE W REFLEX MICROSCOPIC    EKG None  Radiology No results  found.  Procedures Procedures  {Document cardiac monitor, telemetry assessment procedure when appropriate:1}  Medications Ordered in ED Medications  ketorolac (TORADOL) 30 MG/ML injection 30 mg (has no administration in time range)    ED Course/ Medical Decision Making/ A&P   {   Click here for ABCD2, HEART and other calculatorsREFRESH Note before signing :1}                              Medical Decision Making Amount and/or Complexity of Data Reviewed Labs: ordered. Radiology: ordered.  Risk Prescription drug management.   ***  {Document critical care time when appropriate:1} {Document review of labs and clinical decision tools ie heart score, Chads2Vasc2 etc:1}  {Document your independent review of radiology images, and any outside records:1} {Document your discussion with family members, caretakers, and with consultants:1} {Document social determinants of health affecting pt's care:1} {Document your decision making why or why not admission, treatments were needed:1} Final Clinical Impression(s) / ED Diagnoses Final diagnoses:  None    Rx / DC Orders ED Discharge Orders     None

## 2023-05-29 NOTE — ED Notes (Signed)
Patient transported to CT 

## 2023-05-29 NOTE — ED Notes (Signed)
Patient reports R rib pain that started a few days ago following having someone walk on his back. Patient denies any other injuries or heavy lifting. Patient denies N/V, SOB. Pain worse when breathing in. Alert and oriented, ambu. Airway patent and intact; in no apparent distress. VSS

## 2023-05-30 DIAGNOSIS — R0789 Other chest pain: Secondary | ICD-10-CM | POA: Diagnosis not present

## 2023-05-30 MED ORDER — IBUPROFEN 600 MG PO TABS: 600.0000 mg | ORAL_TABLET | Freq: Four times a day (QID) | ORAL | 0 refills | Status: AC | PRN

## 2023-05-30 NOTE — Discharge Instructions (Addendum)
You were seen today for right lower chest wall pain.  This is likely musculoskeletal.  Your x-ray does not show an obvious rib fracture.  Take ibuprofen as needed for pain.

## 2023-06-14 ENCOUNTER — Other Ambulatory Visit: Payer: Self-pay | Admitting: Internal Medicine

## 2023-06-14 DIAGNOSIS — F5101 Primary insomnia: Secondary | ICD-10-CM

## 2023-06-15 ENCOUNTER — Encounter: Payer: Self-pay | Admitting: Internal Medicine

## 2023-06-22 ENCOUNTER — Emergency Department (HOSPITAL_COMMUNITY): Payer: Medicaid Other

## 2023-06-22 ENCOUNTER — Other Ambulatory Visit: Payer: Self-pay

## 2023-06-22 ENCOUNTER — Emergency Department (HOSPITAL_COMMUNITY)
Admission: EM | Admit: 2023-06-22 | Discharge: 2023-06-22 | Disposition: A | Discharge status: Home / Self Care | Payer: Medicaid Other | Attending: Emergency Medicine | Admitting: Emergency Medicine

## 2023-06-22 DIAGNOSIS — N23 Unspecified renal colic: Secondary | ICD-10-CM | POA: Insufficient documentation

## 2023-06-22 DIAGNOSIS — D72829 Elevated white blood cell count, unspecified: Secondary | ICD-10-CM | POA: Insufficient documentation

## 2023-06-22 DIAGNOSIS — K429 Umbilical hernia without obstruction or gangrene: Secondary | ICD-10-CM | POA: Diagnosis not present

## 2023-06-22 DIAGNOSIS — N132 Hydronephrosis with renal and ureteral calculous obstruction: Secondary | ICD-10-CM | POA: Diagnosis not present

## 2023-06-22 DIAGNOSIS — R109 Unspecified abdominal pain: Secondary | ICD-10-CM | POA: Diagnosis not present

## 2023-06-22 DIAGNOSIS — R591 Generalized enlarged lymph nodes: Secondary | ICD-10-CM | POA: Diagnosis not present

## 2023-06-22 LAB — BASIC METABOLIC PANEL
Anion gap: 9 (ref 5–15)
BUN: 11 mg/dL (ref 6–20)
CO2: 25 mmol/L (ref 22–32)
Calcium: 8.9 mg/dL (ref 8.9–10.3)
Chloride: 104 mmol/L (ref 98–111)
Creatinine, Ser: 0.93 mg/dL (ref 0.61–1.24)
GFR, Estimated: >60 mL/min (ref >60)
Glucose, Bld: 142 mg/dL — ABNORMAL HIGH (ref 70–99)
Potassium: 3.8 mmol/L (ref 3.5–5.1)
Sodium: 138 mmol/L (ref 135–145)

## 2023-06-22 LAB — URINALYSIS, ROUTINE W REFLEX MICROSCOPIC
Bacteria, UA: NONE SEEN
Bilirubin Urine: NEGATIVE
Glucose, UA: NEGATIVE mg/dL
Ketones, ur: NEGATIVE mg/dL
Leukocytes,Ua: NEGATIVE
Nitrite: NEGATIVE
Protein, ur: 30 mg/dL — AB
RBC / HPF: >50 RBC/hpf (ref 0–5)
Specific Gravity, Urine: 1.021 (ref 1.005–1.030)
pH: 6 (ref 5.0–8.0)

## 2023-06-22 LAB — CBC
HCT: 45.2 % (ref 39.0–52.0)
Hemoglobin: 15.7 g/dL (ref 13.0–17.0)
MCH: 33.8 pg (ref 26.0–34.0)
MCHC: 34.7 g/dL (ref 30.0–36.0)
MCV: 97.4 fL (ref 80.0–100.0)
Platelets: 328 10*3/uL (ref 150–400)
RBC: 4.64 MIL/uL (ref 4.22–5.81)
RDW: 13.4 % (ref 11.5–15.5)
WBC: 13.8 10*3/uL — ABNORMAL HIGH (ref 4.0–10.5)
nRBC: 0 % (ref 0.0–0.2)

## 2023-06-22 MED ORDER — OXYCODONE-ACETAMINOPHEN 5-325 MG PO TABS: 1.0000 | ORAL_TABLET | ORAL | 0 refills | Status: DC | PRN

## 2023-06-22 MED ORDER — HYDROMORPHONE HCL 1 MG/ML IJ SOLN
1.0000 mg | Freq: Once | INTRAMUSCULAR | Status: AC
  Administered 2023-06-22: 1 mg via INTRAVENOUS
  Filled 2023-06-22: qty 1

## 2023-06-22 MED ORDER — OXYCODONE-ACETAMINOPHEN 5-325 MG PO TABS: 1.0000 | ORAL_TABLET | Freq: Four times a day (QID) | ORAL | 0 refills | Status: DC | PRN

## 2023-06-22 MED ORDER — ONDANSETRON HCL 4 MG/2ML IJ SOLN
4.0000 mg | Freq: Once | INTRAMUSCULAR | Status: AC
  Administered 2023-06-22: 4 mg via INTRAVENOUS
  Filled 2023-06-22: qty 2

## 2023-06-22 MED ORDER — TAMSULOSIN HCL 0.4 MG PO CAPS: 0.4000 mg | ORAL_CAPSULE | Freq: Every day | ORAL | 0 refills | Status: DC

## 2023-06-22 MED ORDER — KETOROLAC TROMETHAMINE 30 MG/ML IJ SOLN
15.0000 mg | Freq: Once | INTRAMUSCULAR | Status: AC
  Administered 2023-06-22: 15 mg via INTRAVENOUS
  Filled 2023-06-22: qty 1

## 2023-06-22 NOTE — ED Provider Notes (Signed)
[place] [facility]GENC[facility] DEPARTMENT AT [facility] Provider Note   CSN: [id number] Arrival date & time: 06/22/23  7829     History  Chief Complaint  Patient presents with   Flank Pain    Nathan Waters is a 42 y.o. male.  Presents to the emergency for evaluation of left flank pain.  Pain started several hours ago, somewhat suddenly.  Associated nausea but no vomiting.       Home Medications Prior to Admission medications   Medication Sig Start Date End Date Taking? Authorizing Provider  albuterol (PROVENTIL HFA;VENTOLIN HFA) 108 (90 Base) MCG/ACT inhaler Inhale 2 puffs into the lungs every 6 (six) hours as needed for wheezing or shortness of breath.    [provider]  atorvastatin (LIPITOR) 40 MG tablet Take 1 tablet (40 mg total) by mouth daily. 05/09/23   Billie Lade, MD  Cholecalciferol 1.25 MG (50000 UT) capsule Take 1 capsule (50,000 Units total) by mouth once a week. 12/16/22   Gardenia Phlegm, MD  hydrOXYzine (VISTARIL) 50 MG capsule TAKE 1 CAPSULE(50 MG) BY MOUTH AT BEDTIME AS NEEDED 06/14/23   Billie Lade, MD  ibuprofen (ADVIL) 200 MG tablet Take 800 mg by mouth every 8 (eight) hours as needed (headache).    [provider]  ibuprofen (ADVIL) 600 MG tablet Take 1 tablet (600 mg total) by mouth every 6 (six) hours as needed. 05/30/23   Horton, Mayer Masker, MD  lidocaine-prilocaine (EMLA) cream Apply 1 application. topically once. Prior to procedure.    [provider]      Allergies    Penicillins    Review of Systems   Review of Systems  Physical Exam Updated Vital Signs BP (!) 137/95   Pulse 87   Temp 98.1 F (36.7 C) (Oral)   Resp 16   SpO2 95%  Physical Exam Vitals and nursing note reviewed.  Constitutional:      General: He is not in acute distress.    Appearance: He is well-developed.  HENT:     Head: Normocephalic and atraumatic.     Mouth/Throat:     Mouth: Mucous membranes are moist.  Eyes:     General:  Vision grossly intact. Gaze aligned appropriately.     Extraocular Movements: Extraocular movements intact.     Conjunctiva/sclera: Conjunctivae normal.  Cardiovascular:     Rate and Rhythm: Normal rate and regular rhythm.     Pulses: Normal pulses.     Heart sounds: Normal heart sounds, S1 normal and S2 normal. No murmur heard.    No friction rub. No gallop.  Pulmonary:     Effort: Pulmonary effort is normal. No respiratory distress.     Breath sounds: Normal breath sounds.  Abdominal:     Palpations: Abdomen is soft.     Tenderness: There is no abdominal tenderness. There is left CVA tenderness. There is no guarding or rebound.     Hernia: No hernia is present.  Musculoskeletal:        General: No swelling.     Cervical back: Full passive range of motion without pain, normal range of motion and neck supple. No pain with movement, spinous process tenderness or muscular tenderness. Normal range of motion.     Right lower leg: No edema.     Left lower leg: No edema.  Skin:    General: Skin is warm and dry.     Capillary Refill: Capillary refill takes less than 2 seconds.  Findings: No ecchymosis, erythema, lesion or wound.  Neurological:     Mental Status: He is alert and oriented to person, place, and time.     GCS: GCS eye subscore is 4. GCS verbal subscore is 5. GCS motor subscore is 6.     Cranial Nerves: Cranial nerves 2-12 are intact.     Sensory: Sensation is intact.     Motor: Motor function is intact. No weakness or abnormal muscle tone.     Coordination: Coordination is intact.  Psychiatric:        Mood and Affect: Mood normal.        Speech: Speech normal.        Behavior: Behavior normal.     ED Results / Procedures / Treatments   Labs (all labs ordered are listed, but only abnormal results are displayed) Labs Reviewed  BASIC METABOLIC PANEL - Abnormal; Notable for the following components:      Result Value   Glucose, Bld 142 (*)    All other components  within normal limits  CBC - Abnormal; Notable for the following components:   WBC 13.8 (*)    All other components within normal limits  URINALYSIS, ROUTINE W REFLEX MICROSCOPIC    EKG None  Radiology CT RENAL STONE STUDY  Result Date: 06/22/2023 CLINICAL DATA:  Patient with history of Hodgkin's lymphoma with left flank pain and nausea. EXAM: CT ABDOMEN AND PELVIS WITHOUT CONTRAST TECHNIQUE: Multidetector CT imaging of the abdomen and pelvis was performed following the standard protocol without IV contrast. RADIATION DOSE REDUCTION: This exam was performed according to the departmental dose-optimization program which includes automated exposure control, adjustment of the mA and/or kV according to patient size and/or use of iterative reconstruction technique. COMPARISON:  PET-CTs dated 03/30/2023 and 09/29/2022 FINDINGS: Lower chest: Atherosclerotic atelectasis or scarring in the bilateral anterior lung bases, mild posterior atelectasis. Lung bases are clear of infiltrates. The cardiac size is normal. Hepatobiliary: The liver is 18.8 cm length mildly steatotic. No focal abnormality is seen without contrast. Again noted surgical absence gallbladder without biliary dilatation. Pancreas: Unremarkable without contrast. Spleen: Surgically absent. There are a few small splenules in the left subphrenic space. Adrenals/Urinary Tract: There is no adrenal mass or contour deforming abnormality of either kidney. There is a 2 mm nonobstructing caliceal stone in the inferior pole of the right kidney. There are no intrarenal stones on the left, but there is left renal edema, mild perinephric edema, and moderate hydroureteronephrosis above a 3 mm proximal ureteral stone at the level of L2-3. This was present on the 03/30/2023 PET-CT and was lodged at the UPJ at L1-2 about 3.5 cm cephalad to its current position. There was no hydronephrosis at that time. Both ureters are otherwise clear. The bladder is unremarkable for  the degree of distention. Stomach/Bowel: No dilatation or wall thickening.  Normal appendix. Vascular/Lymphatic: No significant vascular findings. Multiple scattered lymph nodes are again noted in the upper abdomen. Examples include an index node in the gastrohepatic ligament measuring 1.5 cm in short axis on 2:16, previously 1.3 cm; another index node anterior to the body of the stomach, 1.2 cm short axis on 2:18, previously 1 cm. Scattered retroperitoneal lymph nodes are present largest are in the left periaortic chain, including a stable index lymph node of 8 mm in short axis on 2:41. There is again noted bilateral external iliac chain adenopathy. Largest on the right is 3.8 by 1.6 cm on 2:86, was previously 4.4 x 1.8 cm. On  the left, there is an external iliac chain node measuring 2.2 x 1.7 cm on 2:82 which previously measured 2.1 x 1.5 cm, and an index lymph node measuring 1.8 cm short axis on 2:88 which was previously 1.9 cm. Shotty inguinal lymph nodes appear similar and a few on the right are enlarged. A right inguinal lymph node measuring 1.2 cm short axis on 2:92 was previously 1.3 cm. More anteriorly another right inguinal node is 1.3 cm short axis on 2:91 today, was previously 1.3 cm. Reproductive: No prostatomegaly. Other: Small umbilical fat hernia. No incarcerated hernia. No free air or free fluid. Musculoskeletal: Solid fusion noted L4-5 with posterior rods and pedicle screws, interbody metal hardware. No acute or other significant osseous findings. Mild hip DJD. Degenerative discs with spondylosis lower thoracic spine. IMPRESSION: 1. 3 mm proximal left ureteral stone at the level of L2-3, with moderate obstructive uropathy, on 03/30/2023 was at L1-2. Correlate clinically for infectious complication. 2. 2 mm nonobstructing stone in the inferior pole of the right kidney. 3. Upper abdominal, retroperitoneal and bilateral external iliac chain adenopathy. Some of the lymph nodes are very slightly larger  than on the prior PET-CT. 4. Mild hepatic steatosis. 5. Small umbilical fat hernia. Electronically Signed   By: Almira Bar M.D.   On: 06/22/2023 04:56    Procedures Procedures    Medications Ordered in ED Medications  HYDROmorphone (DILAUDID) injection 1 mg (1 mg Intravenous Given 06/22/23 0453)  ondansetron (ZOFRAN) injection 4 mg (4 mg Intravenous Given 06/22/23 0453)  ketorolac (TORADOL) 30 MG/ML injection 15 mg (15 mg Intravenous Given 06/22/23 0453)    ED Course/ Medical Decision Making/ A&P                                 Medical Decision Making Amount and/or Complexity of Data Reviewed Labs: ordered. Decision-making details documented in ED Course. Radiology: ordered and independent interpretation performed. Decision-making details documented in ED Course.  Risk Prescription drug management.   Differential Diagnosis considered includes, but not limited to: Renal colic/kidney stone; pyelonephritis; aortic dissection; musculoskeletal pain.  Presents with left flank pain.  No fever, mild leukocytosis.  Renal function normal.  CT scan performed and reviewed by myself.  Patient with left-sided hydronephrosis from a proximal ureteral stone which explains his symptoms.  No evidence of UTI.  Patient pain-free after treatment.  Will discharge with Flomax, Percocet, follow-up with urology.        Final Clinical Impression(s) / ED Diagnoses Final diagnoses:  Ureteral colic    Rx / DC Orders ED Discharge Orders     None         Gilda Crease, MD 06/22/23 [phone]

## 2023-06-22 NOTE — ED Triage Notes (Signed)
Pt c/o left flank pain and nausea.

## 2023-06-23 ENCOUNTER — Telehealth: Payer: Self-pay

## 2023-06-23 MED FILL — Oxycodone w/ Acetaminophen Tab 5-325 MG: ORAL | Qty: 6 | Status: AC

## 2023-06-23 NOTE — Telephone Encounter (Signed)
HOSPITAL FOLLOW UP  Our office received a ED/US Referral Summary from [facility].   Diagnosis:  FLANK PAIN  The patient was contacted and a voicemail was left advising the patient to contact our office to schedule an appointment

## 2023-06-29 ENCOUNTER — Ambulatory Visit: Payer: Medicaid Other | Admitting: Internal Medicine

## 2023-06-29 ENCOUNTER — Encounter: Payer: Self-pay | Admitting: Internal Medicine

## 2023-06-29 VITALS — BP 123/83 | HR 93 | Ht 71.0 in | Wt 274.2 lb

## 2023-06-29 DIAGNOSIS — Z2821 Immunization not carried out because of patient refusal: Secondary | ICD-10-CM

## 2023-06-29 DIAGNOSIS — K219 Gastro-esophageal reflux disease without esophagitis: Secondary | ICD-10-CM | POA: Insufficient documentation

## 2023-06-29 MED ORDER — OMEPRAZOLE 20 MG PO CPDR
20.0000 mg | DELAYED_RELEASE_CAPSULE | Freq: Every day | ORAL | 3 refills | Status: DC
Start: 2023-06-29 — End: 2023-10-24

## 2023-06-29 NOTE — Assessment & Plan Note (Addendum)
His epigastric pain is likely due to gastritis/GERD Started omeprazole 20 mg QD Advised to take simethicone as needed for bloating Avoid hot and spicy food, caffeinated products

## 2023-06-29 NOTE — Progress Notes (Signed)
Acute Office Visit  Subjective:    Patient ID: Nathan Waters, male    DOB: 1981/01/20, 42 y.o.   MRN: [id number]  Chief Complaint  Patient presents with   GI Problem    Burning stomach pain x1 week associated with some acid reflux. Not able to eat regularly     HPI Patient is in today for complaint of epigastric burning for the last 1 week.  He has worsening of epigastric pain after eating.  Denies any nausea or vomiting.  Denies melena or hematochezia.  He has not tried any OTC antacid yet.  He has had cholecystectomy in 2020.  Past Medical History:  Diagnosis Date   Asthma    Cancer (HCC)    stage 3 hodgkins lymphoma   Coronary atherosclerosis of native coronary artery    BMS to mid circumflex 11/2011, LVEF 55-60%   Essential hypertension    Migraine    Mixed hyperlipidemia    STEMI (ST elevation myocardial infarction) (HCC)    Complicated by VF arrest 11/2011    Past Surgical History:  Procedure Laterality Date   AXILLARY LYMPH NODE BIOPSY Left 07/26/2019   Procedure: AXILLARY LYMPH NODE BIOPSY;  Surgeon: Franky Macho, MD;  Location: [facility];  Service: General;  Laterality: Left;   BACK SURGERY     CARDIAC CATHETERIZATION     CHOLECYSTECTOMY N/A 08/26/2019   Procedure: LAPAROSCOPIC CHOLECYSTECTOMY;  Surgeon: Franky Macho, MD;  Location: [facility];  Service: General;  Laterality: N/A;   FISTULOTOMY N/A 11/14/2022   Procedure: FISTULOTOMY;  Surgeon: Franky Macho, MD;  Location: [facility];  Service: General;  Laterality: N/A;   INGUINAL LYMPH NODE BIOPSY Right 05/26/2021   Procedure: INGUINAL LYMPH NODE BIOPSY;  Surgeon: Franky Macho, MD;  Location: [facility];  Service: General;  Laterality: Right;   LEFT HEART CATHETERIZATION WITH CORONARY ANGIOGRAM N/A 11/21/2011   Procedure: LEFT HEART CATHETERIZATION WITH CORONARY ANGIOGRAM;  Surgeon: Peter M Swaziland, MD;  Location: [facility] CATH LAB;  Service: Cardiovascular;  Laterality: N/A;   LYMPH NODE BIOPSY Right 08/26/2019   Procedure: LYMPH  NODE BIOPSY, INGUINAL;  Surgeon: Franky Macho, MD;  Location: [facility];  Service: General;  Laterality: Right;   PERCUTANEOUS CORONARY STENT INTERVENTION (PCI-S) N/A 11/21/2011   Procedure: PERCUTANEOUS CORONARY STENT INTERVENTION (PCI-S);  Surgeon: Peter M Swaziland, MD;  Location: [facility] CATH LAB;  Service: Cardiovascular;  Laterality: N/A;   PORT-A-CATH REMOVAL N/A 12/27/2021   Procedure: MINOR REMOVAL PORT-A-CATH;  Surgeon: Franky Macho, MD;  Location: [facility];  Service: General;  Laterality: N/A;   PORTACATH PLACEMENT Left 10/01/2019   Procedure: INSERTION PORT-A-CATH (catheter attached left subclavian);  Surgeon: Franky Macho, MD;  Location: [facility];  Service: General;  Laterality: Left;   SPLENECTOMY      Family History  Problem Relation Age of Onset   Diabetes Mother    Malignant hyperthermia Mother    Cancer Mother        throat cancer   Heart disease Father    Cancer Father    Malignant hyperthermia Brother    Heart disease Brother    Asthma Brother    COPD Brother    ADD / ADHD Daughter    ADD / ADHD Son     Social History   Socioeconomic History   Marital status: Divorced    Spouse name: Not on file   Number of children: 2   Years of education: Not on file   Highest education level: Not on file  Occupational History   Occupation: not employed  Tobacco Use   Smoking status: Every Day    Current packs/day: 0.50    Average packs/day: 1 pack/day for 20.7 years (20.3 ttl pk-yrs)    Types: Cigarettes    Start date: 2024   Smokeless tobacco: Never  Vaping Use   Vaping status: Every Day   Start date: 04/27/2012  Substance and Sexual Activity   Alcohol use: Not Currently    Comment: Occasionally   Drug use: No   Sexual activity: Yes    Birth control/protection: Rhythm  Other Topics Concern   Not on file  Social History Narrative   Not on file   Social Determinants of Health   Financial Resource Strain: Low Risk  (09/23/2020)   Overall Financial Resource Strain  (CARDIA)    Difficulty of Paying Living Expenses: Not hard at all  Food Insecurity: No Food Insecurity (09/23/2020)   Hunger Vital Sign    Worried About Running Out of Food in the Last Year: Never true    Ran Out of Food in the Last Year: Never true  Transportation Needs: No Transportation Needs (09/23/2020)   PRAPARE - Administrator, Civil Service (Medical): No    Lack of Transportation (Non-Medical): No  Physical Activity: Unknown (06/27/2019)   Exercise Vital Sign    Days of Exercise per Week: 3 days    Minutes of Exercise per Session: Not on file  Recent Concern: Physical Activity - Insufficiently Active (06/15/2019)   Exercise Vital Sign    Days of Exercise per Week: 2 days    Minutes of Exercise per Session: 30 min  Stress: No Stress Concern Present (09/23/2020)   [employer] of Occupational Health - Occupational Stress Questionnaire    Feeling of Stress : Not at all  Social Connections: Socially Isolated (09/23/2020)   Social Connection and Isolation Panel [NHANES]    Frequency of Communication with Friends and Family: More than three times a week    Frequency of Social Gatherings with Friends and Family: Three times a week    Attends Religious Services: Never    Active Member of Clubs or Organizations: No    Attends Banker Meetings: Never    Marital Status: Divorced  Catering manager Violence: Not At Risk (09/23/2020)   Humiliation, Afraid, Rape, and Kick questionnaire    Fear of Current or Ex-Partner: No    Emotionally Abused: No    Physically Abused: No    Sexually Abused: No    Outpatient Medications Prior to Visit  Medication Sig Dispense Refill   albuterol (PROVENTIL HFA;VENTOLIN HFA) 108 (90 Base) MCG/ACT inhaler Inhale 2 puffs into the lungs every 6 (six) hours as needed for wheezing or shortness of breath.     aspirin EC 81 MG tablet Take 81 mg by mouth daily. Swallow whole.     atorvastatin (LIPITOR) 40 MG tablet Take 1 tablet (40 mg  total) by mouth daily. 90 tablet 3   hydrOXYzine (VISTARIL) 50 MG capsule TAKE 1 CAPSULE(50 MG) BY MOUTH AT BEDTIME AS NEEDED 30 capsule 0   oxyCODONE-acetaminophen (PERCOCET) 5-325 MG tablet Take 1-2 tablets by mouth every 4 (four) hours as needed for severe pain. 20 tablet 0   oxyCODONE-acetaminophen (PERCOCET/ROXICET) 5-325 MG tablet Take 1 tablet by mouth every 6 (six) hours as needed for severe pain. 6 tablet 0   tamsulosin (FLOMAX) 0.4 MG CAPS capsule Take 1 capsule (0.4 mg total) by mouth daily. 10 capsule 0  Cholecalciferol 1.25 MG (50000 UT) capsule Take 1 capsule (50,000 Units total) by mouth once a week. (Patient not taking: Reported on 06/29/2023) 8 capsule 0   ibuprofen (ADVIL) 200 MG tablet Take 800 mg by mouth every 8 (eight) hours as needed (headache). (Patient not taking: Reported on 06/29/2023)     ibuprofen (ADVIL) 600 MG tablet Take 1 tablet (600 mg total) by mouth every 6 (six) hours as needed. (Patient not taking: Reported on 06/29/2023) 30 tablet 0   lidocaine-prilocaine (EMLA) cream Apply 1 application. topically once. Prior to procedure. (Patient not taking: Reported on 06/29/2023)     No facility-administered medications prior to visit.    Allergies  Allergen Reactions   Penicillins Anaphylaxis    Did it involve swelling of the face/tongue/throat, SOB, or low BP? Yes Did it involve sudden or severe rash/hives, skin peeling, or any reaction on the inside of your mouth or nose? No Did you need to seek medical attention at a hospital or doctor's office? Yes When did it last happen?  Childhood allergy     If all above answers are "NO", may proceed with cephalosporin use.     Review of Systems  Constitutional:  Negative for chills and fever.  HENT:  Negative for congestion and sore throat.   Eyes:  Negative for pain and discharge.  Respiratory:  Negative for cough and shortness of breath.   Cardiovascular:  Negative for chest pain and palpitations.  Gastrointestinal:   Positive for abdominal pain. Negative for constipation, diarrhea, nausea and vomiting.  Endocrine: Negative for polydipsia and polyuria.  Genitourinary:  Negative for dysuria and hematuria.  Musculoskeletal:  Negative for neck pain and neck stiffness.  Skin:  Negative for rash.  Neurological:  Negative for dizziness, weakness, numbness and headaches.  Psychiatric/Behavioral:  Negative for agitation and behavioral problems.        Objective:    Physical Exam Vitals reviewed.  Constitutional:      General: He is not in acute distress.    Appearance: He is not diaphoretic.  HENT:     Head: Normocephalic and atraumatic.     Nose: Nose normal.     Mouth/Throat:     Mouth: Mucous membranes are moist.  Eyes:     General: No scleral icterus.    Extraocular Movements: Extraocular movements intact.  Cardiovascular:     Rate and Rhythm: Normal rate and regular rhythm.     Pulses: Normal pulses.     Heart sounds: No murmur heard. Pulmonary:     Breath sounds: Normal breath sounds. No wheezing or rales.  Abdominal:     Palpations: Abdomen is soft.     Tenderness: There is abdominal tenderness (Mild, epigastric and LUQ).  Musculoskeletal:     Cervical back: Neck supple. No tenderness.     Right lower leg: No edema.     Left lower leg: No edema.  Skin:    General: Skin is warm.     Findings: No rash.  Neurological:     General: No focal deficit present.     Mental Status: He is alert and oriented to person, place, and time.  Psychiatric:        Mood and Affect: Mood normal.        Behavior: Behavior normal.     BP 123/83 (BP Location: Left Arm, Patient Position: Sitting, Cuff Size: Large)   Pulse 93   Ht 5\' 11"  (1.803 m)   Wt 274 lb 3.2 oz (124.4 kg)  SpO2 93%   BMI 38.24 kg/m  Wt Readings from Last 3 Encounters:  06/29/23 274 lb 3.2 oz (124.4 kg)  05/29/23 280 lb (127 kg)  05/09/23 280 lb (127 kg)        Assessment & Plan:   Problem List Items Addressed This  Visit       Digestive   Gastroesophageal reflux disease - Primary    His epigastric pain is likely due to gastritis/GERD Started omeprazole 20 mg QD Advised to take simethicone as needed for bloating Avoid hot and spicy food, caffeinated products      Relevant Medications   omeprazole (PRILOSEC) 20 MG capsule   Other Visit Diagnoses     Refused influenza vaccine            Meds ordered this encounter  Medications   omeprazole (PRILOSEC) 20 MG capsule    Sig: Take 1 capsule (20 mg total) by mouth daily.    Dispense:  30 capsule    Refill:  3     Tammala Weider Concha Se, MD

## 2023-06-29 NOTE — Patient Instructions (Signed)
Please start taking Omeprazole as prescribed for acid reflux.  Please avoid hot and spicy food.  Okay to take GasX as needed for bloating.

## 2023-07-11 ENCOUNTER — Telehealth: Payer: Self-pay

## 2023-07-11 ENCOUNTER — Other Ambulatory Visit: Payer: Self-pay | Admitting: Urology

## 2023-07-11 DIAGNOSIS — N1330 Unspecified hydronephrosis: Secondary | ICD-10-CM

## 2023-07-11 DIAGNOSIS — N201 Calculus of ureter: Secondary | ICD-10-CM

## 2023-07-11 DIAGNOSIS — N200 Calculus of kidney: Secondary | ICD-10-CM

## 2023-07-11 NOTE — Progress Notes (Signed)
Name: SHEILA PAVLICEK DOB: 12/28/1980 MRN: [id number]  History of Present Illness: Mr. Hodgen is a 42 y.o. male who presents today at [facility] [place]. All available relevant medical records have been reviewed.   At last visit with Dr. Ronne Binning on 02/10/2023: - Seen for follow up of rectal fistula / perineal wound. He had been applying Bacitracin BID to incision. Resolved. - The plan was follow up PRN.  He reports concern of kidney stone(s).  He denies prior history of kidney stones.  Recent history: He was seen in the ER on 06/22/2023 for left flank pain. CMP with normal renal function (GFR >60; creatinine 0.93). CBC with leukocytosis (WBC 13.8). UA showed >50 RBC/hpf and protein, otherwise unremarkable. CT showed a 3 mm proximal left ureteral stone at the level of L2-3, with moderate obstructive uropathy, on 03/30/2023 was at L1-2. Also has a 2 mm nonobstructing stone in the inferior pole of the right kidney.  He was discharged with prescriptions for Flomax, and Percocet 5-325 mg (#20 last dispensed 06/22/2023 per PMP aware controlled substance registry).  Today: - KUB today: Awaiting radiology read; left ureteral stone still present at the level of L2-3.  He denies stone passage. He reports bilateral flank / low back pain (L > R) and occasional midline abdominal pain just above his umbilicus. Denies recent fevers or nausea / vomiting.   He reports increased urinary urgency and frequency. Denies dysuria, gross hematuria, hesitancy, straining to void, or sensations of incomplete emptying.   Fall Screening: Do you usually have a device to assist in your mobility? No   Medications: Current Outpatient Medications  Medication Sig Dispense Refill   albuterol (PROVENTIL HFA;VENTOLIN HFA) 108 (90 Base) MCG/ACT inhaler Inhale 2 puffs into the lungs every 6 (six) hours as needed for wheezing or shortness of breath.     aspirin EC 81 MG tablet Take 81 mg by mouth daily.  Swallow whole.     atorvastatin (LIPITOR) 40 MG tablet Take 1 tablet (40 mg total) by mouth daily. 90 tablet 3   Cholecalciferol 1.25 MG (50000 UT) capsule Take 1 capsule (50,000 Units total) by mouth once a week. 8 capsule 0   hydrOXYzine (VISTARIL) 50 MG capsule TAKE 1 CAPSULE(50 MG) BY MOUTH AT BEDTIME AS NEEDED 30 capsule 0   ibuprofen (ADVIL) 200 MG tablet Take 800 mg by mouth every 8 (eight) hours as needed (headache).     ibuprofen (ADVIL) 600 MG tablet Take 1 tablet (600 mg total) by mouth every 6 (six) hours as needed. 30 tablet 0   omeprazole (PRILOSEC) 20 MG capsule Take 1 capsule (20 mg total) by mouth daily. 30 capsule 3   lidocaine-prilocaine (EMLA) cream Apply 1 application  topically once. Prior to procedure. (Patient not taking: Reported on 07/14/2023)     oxyCODONE-acetaminophen (PERCOCET) 5-325 MG tablet Take 1-2 tablets by mouth every 4 (four) hours as needed for severe pain. 20 tablet 0   tamsulosin (FLOMAX) 0.4 MG CAPS capsule Take 1 capsule (0.4 mg total) by mouth daily. 30 capsule 0   No current facility-administered medications for this visit.    Allergies: Allergies  Allergen Reactions   Penicillins Anaphylaxis    Did it involve swelling of the face/tongue/throat, SOB, or low BP? Yes Did it involve sudden or severe rash/hives, skin peeling, or any reaction on the inside of your mouth or nose? No Did you need to seek medical attention at a hospital or doctor's office? Yes When did it last  happen?  Childhood allergy     If all above answers are "NO", may proceed with cephalosporin use.     Past Medical History:  Diagnosis Date   Asthma    Cancer (HCC)    stage 3 hodgkins lymphoma   Coronary atherosclerosis of native coronary artery    BMS to mid circumflex 11/2011, LVEF 55-60%   Essential hypertension    Migraine    Mixed hyperlipidemia    STEMI (ST elevation myocardial infarction) (HCC)    Complicated by VF arrest 11/2011   Past Surgical History:   Procedure Laterality Date   AXILLARY LYMPH NODE BIOPSY Left 07/26/2019   Procedure: AXILLARY LYMPH NODE BIOPSY;  Surgeon: Franky Macho, MD;  Location: [facility];  Service: General;  Laterality: Left;   BACK SURGERY     CARDIAC CATHETERIZATION     CHOLECYSTECTOMY N/A 08/26/2019   Procedure: LAPAROSCOPIC CHOLECYSTECTOMY;  Surgeon: Franky Macho, MD;  Location: [facility];  Service: General;  Laterality: N/A;   FISTULOTOMY N/A 11/14/2022   Procedure: FISTULOTOMY;  Surgeon: Franky Macho, MD;  Location: [facility];  Service: General;  Laterality: N/A;   INGUINAL LYMPH NODE BIOPSY Right 05/26/2021   Procedure: INGUINAL LYMPH NODE BIOPSY;  Surgeon: Franky Macho, MD;  Location: [facility];  Service: General;  Laterality: Right;   LEFT HEART CATHETERIZATION WITH CORONARY ANGIOGRAM N/A 11/21/2011   Procedure: LEFT HEART CATHETERIZATION WITH CORONARY ANGIOGRAM;  Surgeon: Peter M Swaziland, MD;  Location: [facility] C[facility] LAB;  Service: Cardiovascular;  Laterality: N/A;   LYMPH NODE BIOPSY Right 08/26/2019   Procedure: LYMPH NODE BIOPSY, INGUINAL;  Surgeon: Franky Macho, MD;  Location: [facility];  Service: General;  Laterality: Right;   PERCUTANEOUS CORONARY STENT INTERVENTION (PCI-S) N/A 11/21/2011   Procedure: PERCUTANEOUS CORONARY STENT INTERVENTION (PCI-S);  Surgeon: Peter M Swaziland, MD;  Location: [facility] C[facility] LAB;  Service: Cardiovascular;  Laterality: N/A;   PORT-A-CATH REMOVAL N/A 12/27/2021   Procedure: MINOR REMOVAL PORT-A-CATH;  Surgeon: Franky Macho, MD;  Location: [facility];  Service: General;  Laterality: N/A;   PORTACATH PLACEMENT Left 10/01/2019   Procedure: INSERTION PORT-A-CATH (catheter attached left subclavian);  Surgeon: Franky Macho, MD;  Location: [facility];  Service: General;  Laterality: Left;   SPLENECTOMY     Family History  Problem Relation Age of Onset   Diabetes Mother    Malignant hyperthermia Mother    Cancer Mother        throat cancer   Heart disease Father    Cancer Father    Malignant hyperthermia Brother     Heart disease Brother    Asthma Brother    COPD Brother    ADD / ADHD Daughter    ADD / ADHD Son    Social History   Socioeconomic History   Marital status: Divorced    Spouse name: Not on file   Number of children: 2   Years of education: Not on file   Highest education level: Not on file  Occupational History   Occupation: not employed  Tobacco Use   Smoking status: Every Day    Current packs/day: 0.50    Average packs/day: 1 pack/day for 20.7 years (20.4 ttl pk-yrs)    Types: Cigarettes    Start date: 2024   Smokeless tobacco: Never  Vaping Use   Vaping status: Every Day   Start date: 04/27/2012  Substance and Sexual Activity   Alcohol use: Not Currently    Comment: Occasionally   Drug use: No   Sexual activity:  Yes    Birth control/protection: Rhythm  Other Topics Concern   Not on file  Social History Narrative   Not on file   Social Determinants of Health   Financial Resource Strain: Low Risk  (09/23/2020)   Overall Financial Resource Strain (CARDIA)    Difficulty of Paying Living Expenses: Not hard at all  Food Insecurity: No Food Insecurity (09/23/2020)   Hunger Vital Sign    Worried About Running Out of Food in the Last Year: Never true    Ran Out of Food in the Last Year: Never true  Transportation Needs: No Transportation Needs (09/23/2020)   PRAPARE - Administrator, Civil Service (Medical): No    Lack of Transportation (Non-Medical): No  Physical Activity: Unknown (06/27/2019)   Exercise Vital Sign    Days of Exercise per Week: 3 days    Minutes of Exercise per Session: Not on file  Recent Concern: Physical Activity - Insufficiently Active (06/15/2019)   Exercise Vital Sign    Days of Exercise per Week: 2 days    Minutes of Exercise per Session: 30 min  Stress: No Stress Concern Present (09/23/2020)   [employer] of Occupational Health - Occupational Stress Questionnaire    Feeling of Stress : Not at all  Social Connections:  Socially Isolated (09/23/2020)   Social Connection and Isolation Panel [NHANES]    Frequency of Communication with Friends and Family: More than three times a week    Frequency of Social Gatherings with Friends and Family: Three times a week    Attends Religious Services: Never    Active Member of Clubs or Organizations: No    Attends Banker Meetings: Never    Marital Status: Divorced  Catering manager Violence: Not At Risk (09/23/2020)   Humiliation, Afraid, Rape, and Kick questionnaire    Fear of Current or Ex-Partner: No    Emotionally Abused: No    Physically Abused: No    Sexually Abused: No    SUBJECTIVE  Review of Systems Constitutional: Patient denies any unintentional weight loss or change in strength lntegumentary: Patient denies any rashes or pruritus Cardiovascular: Patient denies chest pain or syncope Respiratory: Patient denies shortness of breath Gastrointestinal: Patient denies nausea, vomiting, constipation, or diarrhea Musculoskeletal: Patient denies muscle cramps or weakness Neurologic: Patient denies convulsions or seizures Psychiatric: Patient denies memory problems Allergic/Immunologic: Patient denies recent allergic reaction(s) Hematologic/Lymphatic: Patient denies bleeding tendencies Endocrine: Patient denies heat/cold intolerance  GU: As per HPI.  OBJECTIVE There were no vitals filed for this visit. There is no height or weight on file to calculate BMI.  Physical Examination Constitutional: No obvious distress; patient is non-toxic appearing  Cardiovascular: No visible lower extremity edema.  Respiratory: The patient does not have audible wheezing/stridor; respirations do not appear labored  Gastrointestinal: Abdomen non-distended Musculoskeletal: Normal ROM of UEs  Skin: No obvious rashes/open sores  Neurologic: CN 2-12 grossly intact Psychiatric: Answered questions appropriately with normal affect  Hematologic/Lymphatic/Immunologic:  No obvious bruises or sites of spontaneous bleeding  UA: 6-10 WBC/hpf, >30 RBC/hpf   ASSESSMENT Left ureteral stone - Plan: Urinalysis, Routine w reflex microscopic, tamsulosin (FLOMAX) 0.4 MG CAPS capsule, DG Abd 1 View, oxyCODONE-acetaminophen (PERCOCET) 5-325 MG tablet  Left flank pain - Plan: Urinalysis, Routine w reflex microscopic, tamsulosin (FLOMAX) 0.4 MG CAPS capsule, DG Abd 1 View, oxyCODONE-acetaminophen (PERCOCET) 5-325 MG tablet  Kidney stones - Plan: Urinalysis, Routine w reflex microscopic, tamsulosin (FLOMAX) 0.4 MG CAPS capsule, DG Abd 1 View, oxyCODONE-acetaminophen (  PERCOCET) 5-325 MG tablet  We reviewed recent imaging results; awaiting radiology read but to my assessment of today's KUB his left ureteral stone does not appear to have progressed since ER visit on 06/22/2023.   Based on stone size he was advised there is a high likelihood that it will pass without surgical intervention. Advised Flomax 0.4 mg daily for medical expulsive therapy (MET) x2 weeks then follow up with KUB to reassess.   If stone fails to progress, will consult with MD and likely plan for ESWL. We discussed that procedure in detail along with possible risks and benefits including but not limited to: including pain, infection, hematuria, bruising, possible need for repeat intervention.  For pain management, we discussed the use of opioids versus OTC analgesics. Refill sent for Percocet for PRN use for severe pain.   He was advised to contact urology provider or go to the ER if He develops fever >101F, uncontrollable pain, or other significantly concerning symptoms.  Pt verbalized understanding and agreement. All questions were answered.   PLAN Advised the following: Flomax daily x2 weeks. OTC analgesics PRN for pain with Percocet for severe breakthrough pain. Return in about 2 weeks (around 07/28/2023) for KUB, UA, & f/u with Evette Georges NP.  Orders Placed This Encounter  Procedures   DG Abd  1 View    Standing Status:   Future    Standing Expiration Date:   07/13/2024    Order Specific Question:   Reason for Exam (SYMPTOM  OR DIAGNOSIS REQUIRED)    Answer:   kidney stone    Order Specific Question:   Preferred imaging location?    Answer:   [facility]   Urinalysis, Routine w reflex microscopic    It has been explained that the patient is to follow regularly with their PCP in addition to all other providers involved in their care and to follow instructions provided by these respective offices. Patient advised to contact urology clinic if any urologic-pertaining questions, concerns, new symptoms or problems arise in the interim period.  There are no Patient Instructions on file for this visit.  Electronically signed by: Donnita Falls, MSN, FNP-C, CUNP 07/14/2023 12:54 PM

## 2023-07-11 NOTE — Telephone Encounter (Signed)
Patient is made aware via voiced message "Please ask patient to get RUS & KUB prior to ER f/u appt for stones on Friday 9/27. Thank you!"

## 2023-07-14 ENCOUNTER — Ambulatory Visit: Payer: Medicaid Other | Admitting: Urology

## 2023-07-14 ENCOUNTER — Ambulatory Visit (HOSPITAL_COMMUNITY)
Admission: RE | Admit: 2023-07-14 | Discharge: 2023-07-14 | Disposition: A | Discharge status: Home / Self Care | Payer: Medicaid Other | Source: Ambulatory Visit | Attending: Urology | Admitting: Urology

## 2023-07-14 ENCOUNTER — Encounter: Payer: Self-pay | Admitting: Urology

## 2023-07-14 DIAGNOSIS — N1330 Unspecified hydronephrosis: Secondary | ICD-10-CM | POA: Insufficient documentation

## 2023-07-14 DIAGNOSIS — N201 Calculus of ureter: Secondary | ICD-10-CM | POA: Diagnosis not present

## 2023-07-14 DIAGNOSIS — N200 Calculus of kidney: Secondary | ICD-10-CM | POA: Insufficient documentation

## 2023-07-14 DIAGNOSIS — R109 Unspecified abdominal pain: Secondary | ICD-10-CM | POA: Diagnosis not present

## 2023-07-14 LAB — URINALYSIS, ROUTINE W REFLEX MICROSCOPIC
Bilirubin, UA: NEGATIVE
Glucose, UA: NEGATIVE
Ketones, UA: NEGATIVE
Nitrite, UA: NEGATIVE
Protein,UA: NEGATIVE
Specific Gravity, UA: 1.015 (ref 1.005–1.030)
Urobilinogen, Ur: 1 mg/dL (ref 0.2–1.0)
pH, UA: 6.5 (ref 5.0–7.5)

## 2023-07-14 LAB — MICROSCOPIC EXAMINATION
Bacteria, UA: NONE SEEN
RBC, Urine: >30 /[HPF] — AB (ref 0–2)

## 2023-07-14 MED ORDER — OXYCODONE-ACETAMINOPHEN 5-325 MG PO TABS
1.0000 | ORAL_TABLET | ORAL | 0 refills | Status: DC | PRN
Start: 2023-07-14 — End: 2023-07-28

## 2023-07-14 MED ORDER — TAMSULOSIN HCL 0.4 MG PO CAPS
0.4000 mg | ORAL_CAPSULE | Freq: Every day | ORAL | 0 refills | Status: DC
Start: 2023-07-14 — End: 2023-08-10

## 2023-07-19 ENCOUNTER — Other Ambulatory Visit: Payer: Self-pay | Admitting: Internal Medicine

## 2023-07-19 DIAGNOSIS — F5101 Primary insomnia: Secondary | ICD-10-CM

## 2023-07-24 NOTE — Progress Notes (Signed)
Name: Nathan Waters DOB: 1980-10-23 MRN: [id number]  History of Present Illness: Mr. Thorstenson is a 42 y.o. male who presents today for follow up visit at [facility] [place]. He is accompanied by his girlfriend [place]. - GU History: 1. Kidney stone x1.  Recent history:  > 06/22/2023: - Seen in the ER for left flank pain. - CT showed a 3 mm proximal left ureteral stone at the level of L2-3, with moderate obstructive uropathy, on 03/30/2023 was at L1-2. Also has a 2 mm nonobstructing stone in the inferior pole of the right kidney.  > 07/14/2023: Urology visit. - Reported  bilateral flank / low back pain (L > R) and occasional midline abdominal pain just above his umbilicus. Also reported increased urinary urgency and frequency.  - KUB: Awaiting radiology read; per my interpretation his left ureteral stone did not appear to have progressed since ER visit on 06/22/2023.  - The plan was Flomax for medical expulsive therapy x2 weeks then f/u with KUB for recheck. If stone fails to progress, will consult with MD and likely plan for ESWL (which was discussed in detail).  Today: KUB today: Awaiting radiology read; left ureteral stone appears to have progressed distally to the level of L4.  He denies recent stone passage. He reports persistent left flank pain which is intermittent and varies in severity. Denies abdominal pain, fevers, nausea, or vomiting.  He reports increased urinary urgency, frequency, and hesitancy. He denies dysuria, gross hematuria, or sensations of incomplete bladder emptying.   Fall Screening: Do you usually have a device to assist in your mobility? No   Medications: Current Outpatient Medications  Medication Sig Dispense Refill   albuterol (PROVENTIL HFA;VENTOLIN HFA) 108 (90 Base) MCG/ACT inhaler Inhale 2 puffs into the lungs every 6 (six) hours as needed for wheezing or shortness of breath.     aspirin EC 81 MG tablet Take 81 mg by mouth daily. Swallow  whole.     atorvastatin (LIPITOR) 40 MG tablet Take 1 tablet (40 mg total) by mouth daily. 90 tablet 3   Cholecalciferol 1.25 MG (50000 UT) capsule Take 1 capsule (50,000 Units total) by mouth once a week. 8 capsule 0   hydrOXYzine (VISTARIL) 50 MG capsule TAKE 1 CAPSULE(50 MG) BY MOUTH AT BEDTIME AS NEEDED 30 capsule 0   ibuprofen (ADVIL) 200 MG tablet Take 800 mg by mouth every 8 (eight) hours as needed (headache).     ibuprofen (ADVIL) 600 MG tablet Take 1 tablet (600 mg total) by mouth every 6 (six) hours as needed. 30 tablet 0   lidocaine-prilocaine (EMLA) cream Apply 1 application  topically once. Prior to procedure.     omeprazole (PRILOSEC) 20 MG capsule Take 1 capsule (20 mg total) by mouth daily. 30 capsule 3   tamsulosin (FLOMAX) 0.4 MG CAPS capsule Take 1 capsule (0.4 mg total) by mouth daily. 30 capsule 0   oxyCODONE-acetaminophen (PERCOCET) 5-325 MG tablet Take 1-2 tablets by mouth every 4 (four) hours as needed for severe pain. 20 tablet 0   No current facility-administered medications for this visit.    Allergies: Allergies  Allergen Reactions   Penicillins Anaphylaxis    Did it involve swelling of the face/tongue/throat, SOB, or low BP? Yes Did it involve sudden or severe rash/hives, skin peeling, or any reaction on the inside of your mouth or nose? No Did you need to seek medical attention at a hospital or doctor's office? Yes When did it last happen?  Childhood  allergy     If all above answers are "NO", may proceed with cephalosporin use.     Past Medical History:  Diagnosis Date   Asthma    Cancer (HCC)    stage 3 hodgkins lymphoma   Coronary atherosclerosis of native coronary artery    BMS to mid circumflex 11/2011, LVEF 55-60%   Essential hypertension    Migraine    Mixed hyperlipidemia    STEMI (ST elevation myocardial infarction) (HCC)    Complicated by VF arrest 11/2011   Past Surgical History:  Procedure Laterality Date   AXILLARY LYMPH NODE BIOPSY  Left 07/26/2019   Procedure: AXILLARY LYMPH NODE BIOPSY;  Surgeon: Franky Macho, MD;  Location: [facility];  Service: General;  Laterality: Left;   BACK SURGERY     CARDIAC CATHETERIZATION     CHOLECYSTECTOMY N/A 08/26/2019   Procedure: LAPAROSCOPIC CHOLECYSTECTOMY;  Surgeon: Franky Macho, MD;  Location: [facility];  Service: General;  Laterality: N/A;   FISTULOTOMY N/A 11/14/2022   Procedure: FISTULOTOMY;  Surgeon: Franky Macho, MD;  Location: [facility];  Service: General;  Laterality: N/A;   INGUINAL LYMPH NODE BIOPSY Right 05/26/2021   Procedure: INGUINAL LYMPH NODE BIOPSY;  Surgeon: Franky Macho, MD;  Location: [facility];  Service: General;  Laterality: Right;   LEFT HEART CATHETERIZATION WITH CORONARY ANGIOGRAM N/A 11/21/2011   Procedure: LEFT HEART CATHETERIZATION WITH CORONARY ANGIOGRAM;  Surgeon: Peter M Swaziland, MD;  Location: [facility] CATH LAB;  Service: Cardiovascular;  Laterality: N/A;   LYMPH NODE BIOPSY Right 08/26/2019   Procedure: LYMPH NODE BIOPSY, INGUINAL;  Surgeon: Franky Macho, MD;  Location: [facility];  Service: General;  Laterality: Right;   PERCUTANEOUS CORONARY STENT INTERVENTION (PCI-S) N/A 11/21/2011   Procedure: PERCUTANEOUS CORONARY STENT INTERVENTION (PCI-S);  Surgeon: Peter M Swaziland, MD;  Location: [facility] CATH LAB;  Service: Cardiovascular;  Laterality: N/A;   PORT-A-CATH REMOVAL N/A 12/27/2021   Procedure: MINOR REMOVAL PORT-A-CATH;  Surgeon: Franky Macho, MD;  Location: [facility];  Service: General;  Laterality: N/A;   PORTACATH PLACEMENT Left 10/01/2019   Procedure: INSERTION PORT-A-CATH (catheter attached left subclavian);  Surgeon: Franky Macho, MD;  Location: [facility];  Service: General;  Laterality: Left;   SPLENECTOMY     Family History  Problem Relation Age of Onset   Diabetes Mother    Malignant hyperthermia Mother    Cancer Mother        throat cancer   Heart disease Father    Cancer Father    Malignant hyperthermia Brother    Heart disease Brother    Asthma Brother    COPD  Brother    ADD / ADHD Daughter    ADD / ADHD Son    Social History   Socioeconomic History   Marital status: Divorced    Spouse name: Not on file   Number of children: 2   Years of education: Not on file   Highest education level: Not on file  Occupational History   Occupation: not employed  Tobacco Use   Smoking status: Every Day    Current packs/day: 0.50    Average packs/day: 1 pack/day for 20.8 years (20.4 ttl pk-yrs)    Types: Cigarettes    Start date: 2024   Smokeless tobacco: Never  Vaping Use   Vaping status: Every Day   Start date: 04/27/2012  Substance and Sexual Activity   Alcohol use: Not Currently    Comment: Occasionally   Drug use: No   Sexual activity: Yes  Birth control/protection: Rhythm  Other Topics Concern   Not on file  Social History Narrative   Not on file   Social Determinants of Health   Financial Resource Strain: Low Risk  (09/23/2020)   Overall Financial Resource Strain (CARDIA)    Difficulty of Paying Living Expenses: Not hard at all  Food Insecurity: No Food Insecurity (09/23/2020)   Hunger Vital Sign    Worried About Running Out of Food in the Last Year: Never true    Ran Out of Food in the Last Year: Never true  Transportation Needs: No Transportation Needs (09/23/2020)   PRAPARE - Administrator, Civil Service (Medical): No    Lack of Transportation (Non-Medical): No  Physical Activity: Unknown (06/27/2019)   Exercise Vital Sign    Days of Exercise per Week: 3 days    Minutes of Exercise per Session: Not on file  Recent Concern: Physical Activity - Insufficiently Active (06/15/2019)   Exercise Vital Sign    Days of Exercise per Week: 2 days    Minutes of Exercise per Session: 30 min  Stress: No Stress Concern Present (09/23/2020)   [employer] of Occupational Health - Occupational Stress Questionnaire    Feeling of Stress : Not at all  Social Connections: Socially Isolated (09/23/2020)   Social Connection and  Isolation Panel [NHANES]    Frequency of Communication with Friends and Family: More than three times a week    Frequency of Social Gatherings with Friends and Family: Three times a week    Attends Religious Services: Never    Active Member of Clubs or Organizations: No    Attends Banker Meetings: Never    Marital Status: Divorced  Catering manager Violence: Not At Risk (09/23/2020)   Humiliation, Afraid, Rape, and Kick questionnaire    Fear of Current or Ex-Partner: No    Emotionally Abused: No    Physically Abused: No    Sexually Abused: No    SUBJECTIVE  Review of Systems Constitutional: Patient denies any unintentional weight loss or change in strength lntegumentary: Patient denies any rashes or pruritus Cardiovascular: Patient denies chest pain or syncope Respiratory: Patient denies shortness of breath Gastrointestinal: Patient denies nausea, vomiting, constipation, or diarrhea Musculoskeletal: Patient denies muscle cramps or weakness Neurologic: Patient denies convulsions or seizures Allergic/Immunologic: Patient denies recent allergic reaction(s) Hematologic/Lymphatic: Patient denies bleeding tendencies Endocrine: Patient denies heat/cold intolerance  GU: As per HPI.  OBJECTIVE Vitals:   07/28/23 1158  BP: (!) 132/92  Pulse: 73   There is no height or weight on file to calculate BMI.  Physical Examination Constitutional: No obvious distress; patient is non-toxic appearing  Cardiovascular: No visible lower extremity edema.  Respiratory: The patient does not have audible wheezing/stridor; respirations do not appear labored  Gastrointestinal: Abdomen non-distended Musculoskeletal: Normal ROM of UEs  Skin: No obvious rashes/open sores  Neurologic: CN 2-12 grossly intact Psychiatric: Answered questions appropriately with normal affect  Hematologic/Lymphatic/Immunologic: No obvious bruises or sites of spontaneous bleeding  UA: patient voided just prior  to arrival; did not feel urge to void while in office  ASSESSMENT Left ureteral stone - Plan: oxyCODONE-acetaminophen (PERCOCET) 5-325 MG tablet, DG Abd 1 View, CANCELED: Urinalysis, Routine w reflex microscopic  Kidney stones - Plan: oxyCODONE-acetaminophen (PERCOCET) 5-325 MG tablet, DG Abd 1 View, CANCELED: Urinalysis, Routine w reflex microscopic  Left flank pain - Plan: oxyCODONE-acetaminophen (PERCOCET) 5-325 MG tablet, DG Abd 1 View  We reviewed recent imaging results; awaiting formal radiology read  however left ureteral stone still appears to be present. We discussed option to continue medical expulsive therapy with Flomax versus surgical intervention. He elected to continue Flomax 0.4 mg daily for medical expulsive therapy for another 2 weeks. Refill sent for Percocet PRN for severe pain.   He was advised to contact urology provider or go to the ER if He develops fever >101F, uncontrollable pain, or other significantly concerning symptoms.  Will plan to follow up in 2 weeks with KUB for recheck. Pt verbalized understanding and agreement. All questions were answered.  PLAN Advised the following: Flomax daily x2 weeks. Analgesics PRN for pain. 3. Return in about 2 weeks (around 08/11/2023) for KUB, UA, & f/u with Evette Georges NP.  Orders Placed This Encounter  Procedures   DG Abd 1 View    Standing Status:   Future    Standing Expiration Date:   07/27/2024    Order Specific Question:   Reason for Exam (SYMPTOM  OR DIAGNOSIS REQUIRED)    Answer:   kidney stone    Order Specific Question:   Preferred imaging location?    Answer:   [facility]    It has been explained that the patient is to follow regularly with their PCP in addition to all other providers involved in their care and to follow instructions provided by these respective offices. Patient advised to contact urology clinic if any urologic-pertaining questions, concerns, new symptoms or problems arise in the  interim period.  There are no Patient Instructions on file for this visit.  Electronically signed by:  Donnita Falls, MSN, FNP-C, CUNP 07/28/2023 12:30 PM

## 2023-07-28 ENCOUNTER — Encounter: Payer: Self-pay | Admitting: Urology

## 2023-07-28 ENCOUNTER — Ambulatory Visit (HOSPITAL_COMMUNITY)
Admission: RE | Admit: 2023-07-28 | Discharge: 2023-07-28 | Disposition: A | Discharge status: Home / Self Care | Payer: Medicaid Other | Source: Ambulatory Visit | Attending: Urology | Admitting: Urology

## 2023-07-28 ENCOUNTER — Ambulatory Visit: Payer: Medicaid Other | Admitting: Urology

## 2023-07-28 VITALS — BP 132/92 | HR 73

## 2023-07-28 DIAGNOSIS — N201 Calculus of ureter: Secondary | ICD-10-CM

## 2023-07-28 DIAGNOSIS — N200 Calculus of kidney: Secondary | ICD-10-CM | POA: Diagnosis not present

## 2023-07-28 DIAGNOSIS — R109 Unspecified abdominal pain: Secondary | ICD-10-CM | POA: Insufficient documentation

## 2023-07-28 MED ORDER — OXYCODONE-ACETAMINOPHEN 5-325 MG PO TABS
1.0000 | ORAL_TABLET | ORAL | 0 refills | Status: DC | PRN
Start: 2023-07-28 — End: 2023-08-02

## 2023-08-02 ENCOUNTER — Other Ambulatory Visit: Payer: Self-pay | Admitting: Urology

## 2023-08-02 ENCOUNTER — Telehealth: Payer: Self-pay

## 2023-08-02 DIAGNOSIS — N201 Calculus of ureter: Secondary | ICD-10-CM

## 2023-08-02 DIAGNOSIS — R109 Unspecified abdominal pain: Secondary | ICD-10-CM

## 2023-08-02 NOTE — Telephone Encounter (Signed)
[facility] drug request change.  Oxycodone 5-325mg  is not covered by patient current insurance plan. The preferred alternative is [email or web address]. Please send in requested alternative if appropriate.  Message sent to prescribing NP

## 2023-08-03 ENCOUNTER — Encounter (HOSPITAL_COMMUNITY)
Admission: RE | Admit: 2023-08-03 | Discharge: 2023-08-03 | Disposition: A | Discharge status: Home / Self Care | Payer: Medicaid Other | Source: Ambulatory Visit | Attending: Hematology | Admitting: Hematology

## 2023-08-03 ENCOUNTER — Inpatient Hospital Stay: Payer: Medicaid Other | Attending: Hematology

## 2023-08-03 DIAGNOSIS — C8108 Nodular lymphocyte predominant Hodgkin lymphoma, lymph nodes of multiple sites: Secondary | ICD-10-CM | POA: Diagnosis not present

## 2023-08-03 DIAGNOSIS — C8190 Hodgkin lymphoma, unspecified, unspecified site: Secondary | ICD-10-CM | POA: Diagnosis not present

## 2023-08-03 LAB — COMPREHENSIVE METABOLIC PANEL
ALT: 37 U/L (ref 0–44)
AST: 29 U/L (ref 15–41)
Albumin: 3.8 g/dL (ref 3.5–5.0)
Alkaline Phosphatase: 116 U/L (ref 38–126)
Anion gap: 7 (ref 5–15)
BUN: 10 mg/dL (ref 6–20)
CO2: 25 mmol/L (ref 22–32)
Calcium: 8.5 mg/dL — ABNORMAL LOW (ref 8.9–10.3)
Chloride: 106 mmol/L (ref 98–111)
Creatinine, Ser: 0.79 mg/dL (ref 0.61–1.24)
GFR, Estimated: >60 mL/min (ref >60)
Glucose, Bld: 101 mg/dL — ABNORMAL HIGH (ref 70–99)
Potassium: 4.1 mmol/L (ref 3.5–5.1)
Sodium: 138 mmol/L (ref 135–145)
Total Bilirubin: 0.5 mg/dL (ref 0.3–1.2)
Total Protein: 6.8 g/dL (ref 6.5–8.1)

## 2023-08-03 LAB — CBC WITH DIFFERENTIAL/PLATELET
Abs Immature Granulocytes: 0.04 10*3/uL (ref 0.00–0.07)
Basophils Absolute: 0.1 10*3/uL (ref 0.0–0.1)
Basophils Relative: 1 %
Eosinophils Absolute: 0.3 10*3/uL (ref 0.0–0.5)
Eosinophils Relative: 3 %
HCT: 43.3 % (ref 39.0–52.0)
Hemoglobin: 14.8 g/dL (ref 13.0–17.0)
Immature Granulocytes: 0 %
Lymphocytes Relative: 35 %
Lymphs Abs: 4.3 10*3/uL — ABNORMAL HIGH (ref 0.7–4.0)
MCH: 33.4 pg (ref 26.0–34.0)
MCHC: 34.2 g/dL (ref 30.0–36.0)
MCV: 97.7 fL (ref 80.0–100.0)
Monocytes Absolute: 1.1 10*3/uL — ABNORMAL HIGH (ref 0.1–1.0)
Monocytes Relative: 9 %
Neutro Abs: 6.3 10*3/uL (ref 1.7–7.7)
Neutrophils Relative %: 52 %
Platelets: 297 10*3/uL (ref 150–400)
RBC: 4.43 MIL/uL (ref 4.22–5.81)
RDW: 14.6 % (ref 11.5–15.5)
WBC: 12.1 10*3/uL — ABNORMAL HIGH (ref 4.0–10.5)
nRBC: 0 % (ref 0.0–0.2)

## 2023-08-03 LAB — LACTATE DEHYDROGENASE: LDH: 101 U/L (ref 98–192)

## 2023-08-03 LAB — SEDIMENTATION RATE: Sed Rate: 2 mm/h (ref 0–16)

## 2023-08-03 MED ORDER — FLUDEOXYGLUCOSE F - 18 (FDG) INJECTION
14.6900 | Freq: Once | INTRAVENOUS | Status: AC | PRN
  Administered 2023-08-03: 14.69 via INTRAVENOUS

## 2023-08-04 ENCOUNTER — Other Ambulatory Visit: Payer: Self-pay | Admitting: Urology

## 2023-08-04 ENCOUNTER — Telehealth: Payer: Self-pay

## 2023-08-04 DIAGNOSIS — N200 Calculus of kidney: Secondary | ICD-10-CM

## 2023-08-04 DIAGNOSIS — R109 Unspecified abdominal pain: Secondary | ICD-10-CM

## 2023-08-04 NOTE — Progress Notes (Signed)
Name: Nathan Waters DOB: Jul 05, 1981 MRN: [id number]  History of Present Illness: Mr. Gartman is a 42 y.o. male who presents today for follow up visit at [facility] [place]. - GU History: 1. Kidney stone x1.  Recent history:  > 06/22/2023: - Seen in the ER for left flank pain. - CT showed a 3 mm proximal left ureteral stone at the level of L2-3, with moderate obstructive uropathy, on 03/30/2023 was at L1-2. Also has a 2 mm nonobstructing stone in the inferior pole of the right kidney.   > 07/14/2023: Urology visit. - Reported  bilateral flank / low back pain (L > R) and occasional midline abdominal pain just above his umbilicus. Also reported increased urinary urgency and frequency.  - KUB: Awaiting radiology read; per my interpretation his left ureteral stone did not appear to have progressed since ER visit on 06/22/2023. - The plan was Flomax for medical expulsive therapy x2 weeks then f/u with KUB for recheck.  > 07/28/2023 - KUB: "No radio-opaque calculi or other significant radiographic abnormality are seen." - He remains symptomatic with intermittent left flank pain and LUTS. - The plan was for another 2 weeks of medical expulsive therapy. CT stone ordered.  > 08/03/2023: Had a 4 mm mid left ureteral stone without hydronephrosis visualized on PET scan.  Since last visit: 08/13/2023: CT stone performed. Awaiting radiology read; per my interpretation he has a 4 mm left UVJ stone.  Today: He denies recent stone passage. He reports flank pain or abdominal pain. He denies fevers, nausea, or vomiting.  He denies increased urinary urgency, frequency, nocturia, dysuria, gross hematuria, hesitancy, straining to void, or sensations of incomplete emptying.   Fall Screening: Do you usually have a device to assist in your mobility? No   Medications: Current Outpatient Medications  Medication Sig Dispense Refill   oxyCODONE-acetaminophen (PERCOCET/ROXICET) 5-325 MG tablet  Take 1 tablet by mouth every 6 (six) hours as needed for up to 5 days for severe pain (pain score 7-10). 20 tablet 0   albuterol (PROVENTIL HFA;VENTOLIN HFA) 108 (90 Base) MCG/ACT inhaler Inhale 2 puffs into the lungs every 6 (six) hours as needed for wheezing or shortness of breath.     aspirin EC 81 MG tablet Take 81 mg by mouth daily. Swallow whole.     atorvastatin (LIPITOR) 40 MG tablet Take 1 tablet (40 mg total) by mouth daily. 90 tablet 3   Cholecalciferol 1.25 MG (50000 UT) capsule Take 1 capsule (50,000 Units total) by mouth once a week. 8 capsule 0   hydrOXYzine (VISTARIL) 50 MG capsule TAKE 1 CAPSULE(50 MG) BY MOUTH AT BEDTIME AS NEEDED 30 capsule 0   ibuprofen (ADVIL) 200 MG tablet Take 800 mg by mouth every 8 (eight) hours as needed (headache).     ibuprofen (ADVIL) 600 MG tablet Take 1 tablet (600 mg total) by mouth every 6 (six) hours as needed. 30 tablet 0   lidocaine-prilocaine (EMLA) cream Apply 1 application  topically once. Prior to procedure.     omeprazole (PRILOSEC) 20 MG capsule Take 1 capsule (20 mg total) by mouth daily. 30 capsule 3   tamsulosin (FLOMAX) 0.4 MG CAPS capsule TAKE 1 CAPSULE(0.4 MG) BY MOUTH DAILY 30 capsule 0   No current facility-administered medications for this visit.    Allergies: Allergies  Allergen Reactions   Penicillins Anaphylaxis    Did it involve swelling of the face/tongue/throat, SOB, or low BP? Yes Did it involve sudden or severe rash/hives, skin  peeling, or any reaction on the inside of your mouth or nose? No Did you need to seek medical attention at a hospital or doctor's office? Yes When did it last happen?  Childhood allergy     If all above answers are "NO", may proceed with cephalosporin use.     Past Medical History:  Diagnosis Date   Asthma    Cancer (HCC)    stage 3 hodgkins lymphoma   Coronary atherosclerosis of native coronary artery    BMS to mid circumflex 11/2011, LVEF 55-60%   Essential hypertension    Migraine     Mixed hyperlipidemia    STEMI (ST elevation myocardial infarction) (HCC)    Complicated by VF arrest 11/2011   Past Surgical History:  Procedure Laterality Date   AXILLARY LYMPH NODE BIOPSY Left 07/26/2019   Procedure: AXILLARY LYMPH NODE BIOPSY;  Surgeon: Franky Macho, MD;  Location: [facility];  Service: General;  Laterality: Left;   BACK SURGERY     CARDIAC CATHETERIZATION     CHOLECYSTECTOMY N/A 08/26/2019   Procedure: LAPAROSCOPIC CHOLECYSTECTOMY;  Surgeon: Franky Macho, MD;  Location: [facility];  Service: General;  Laterality: N/A;   FISTULOTOMY N/A 11/14/2022   Procedure: FISTULOTOMY;  Surgeon: Franky Macho, MD;  Location: [facility];  Service: General;  Laterality: N/A;   INGUINAL LYMPH NODE BIOPSY Right 05/26/2021   Procedure: INGUINAL LYMPH NODE BIOPSY;  Surgeon: Franky Macho, MD;  Location: [facility];  Service: General;  Laterality: Right;   LEFT HEART CATHETERIZATION WITH CORONARY ANGIOGRAM N/A 11/21/2011   Procedure: LEFT HEART CATHETERIZATION WITH CORONARY ANGIOGRAM;  Surgeon: Peter M Swaziland, MD;  Location: [facility] CATH LAB;  Service: Cardiovascular;  Laterality: N/A;   LYMPH NODE BIOPSY Right 08/26/2019   Procedure: LYMPH NODE BIOPSY, INGUINAL;  Surgeon: Franky Macho, MD;  Location: [facility];  Service: General;  Laterality: Right;   PERCUTANEOUS CORONARY STENT INTERVENTION (PCI-S) N/A 11/21/2011   Procedure: PERCUTANEOUS CORONARY STENT INTERVENTION (PCI-S);  Surgeon: Peter M Swaziland, MD;  Location: [facility] CATH LAB;  Service: Cardiovascular;  Laterality: N/A;   PORT-A-CATH REMOVAL N/A 12/27/2021   Procedure: MINOR REMOVAL PORT-A-CATH;  Surgeon: Franky Macho, MD;  Location: [facility];  Service: General;  Laterality: N/A;   PORTACATH PLACEMENT Left 10/01/2019   Procedure: INSERTION PORT-A-CATH (catheter attached left subclavian);  Surgeon: Franky Macho, MD;  Location: [facility];  Service: General;  Laterality: Left;   SPLENECTOMY     Family History  Problem Relation Age of Onset   Diabetes Mother     Malignant hyperthermia Mother    Cancer Mother        throat cancer   Heart disease Father    Cancer Father    Malignant hyperthermia Brother    Heart disease Brother    Asthma Brother    COPD Brother    ADD / ADHD Daughter    ADD / ADHD Son    Social History   Socioeconomic History   Marital status: Divorced    Spouse name: Not on file   Number of children: 2   Years of education: Not on file   Highest education level: Not on file  Occupational History   Occupation: not employed  Tobacco Use   Smoking status: Every Day    Current packs/day: 0.50    Average packs/day: 1 pack/day for 20.8 years (20.4 ttl pk-yrs)    Types: Cigarettes    Start date: 2024   Smokeless tobacco: Never  Vaping Use   Vaping status: Every  Day   Start date: 04/27/2012  Substance and Sexual Activity   Alcohol use: Not Currently    Comment: Occasionally   Drug use: No   Sexual activity: Yes    Birth control/protection: Rhythm  Other Topics Concern   Not on file  Social History Narrative   Not on file   Social Determinants of Health   Financial Resource Strain: Low Risk  (09/23/2020)   Overall Financial Resource Strain (CARDIA)    Difficulty of Paying Living Expenses: Not hard at all  Food Insecurity: No Food Insecurity (09/23/2020)   Hunger Vital Sign    Worried About Running Out of Food in the Last Year: Never true    Ran Out of Food in the Last Year: Never true  Transportation Needs: No Transportation Needs (09/23/2020)   PRAPARE - Administrator, Civil Service (Medical): No    Lack of Transportation (Non-Medical): No  Physical Activity: Unknown (06/27/2019)   Exercise Vital Sign    Days of Exercise per Week: 3 days    Minutes of Exercise per Session: Not on file  Recent Concern: Physical Activity - Insufficiently Active (06/15/2019)   Exercise Vital Sign    Days of Exercise per Week: 2 days    Minutes of Exercise per Session: 30 min  Stress: No Stress Concern Present  (09/23/2020)   [employer] of Occupational Health - Occupational Stress Questionnaire    Feeling of Stress : Not at all  Social Connections: Socially Isolated (09/23/2020)   Social Connection and Isolation Panel [NHANES]    Frequency of Communication with Friends and Family: More than three times a week    Frequency of Social Gatherings with Friends and Family: Three times a week    Attends Religious Services: Never    Active Member of Clubs or Organizations: No    Attends Banker Meetings: Never    Marital Status: Divorced  Catering manager Violence: Not At Risk (09/23/2020)   Humiliation, Afraid, Rape, and Kick questionnaire    Fear of Current or Ex-Partner: No    Emotionally Abused: No    Physically Abused: No    Sexually Abused: No    SUBJECTIVE  Review of Systems Constitutional: Patient denies any unintentional weight loss or change in strength lntegumentary: Patient denies any rashes or pruritus Cardiovascular: Patient denies chest pain or syncope Respiratory: Patient denies shortness of breath Gastrointestinal: Patient denies nausea, vomiting, constipation, or diarrhea Musculoskeletal: Patient denies muscle cramps or weakness Neurologic: Patient denies convulsions or seizures Allergic/Immunologic: Patient denies recent allergic reaction(s) Hematologic/Lymphatic: Patient denies bleeding tendencies Endocrine: Patient denies heat/cold intolerance  GU: As per HPI.  OBJECTIVE Vitals:   08/14/23 1347  BP: 108/72  Pulse: 72  Temp: (!) 97.5 F (36.4 C)   There is no height or weight on file to calculate BMI.  Physical Examination Constitutional: No obvious distress; patient is non-toxic appearing  Cardiovascular: No visible lower extremity edema.  Respiratory: The patient does not have audible wheezing/stridor; respirations do not appear labored  Gastrointestinal: Abdomen non-distended Musculoskeletal: Normal ROM of UEs  Skin: No obvious rashes/open  sores  Neurologic: CN 2-12 grossly intact Psychiatric: Answered questions appropriately with normal affect  Hematologic/Lymphatic/Immunologic: No obvious bruises or sites of spontaneous bleeding   ASSESSMENT Left ureteral stone - Plan: oxyCODONE-acetaminophen (PERCOCET/ROXICET) 5-325 MG tablet, CT RENAL STONE STUDY, CANCELED: Urinalysis, Routine w reflex microscopic  Left flank pain - Plan: oxyCODONE-acetaminophen (PERCOCET/ROXICET) 5-325 MG tablet, CT RENAL STONE STUDY, CANCELED: Urinalysis, Routine w reflex  microscopic  We reviewed recent imaging results. Awaiting radiology read; per my interpretation he has a 4 mm left UVJ stone on yesterday's CT. We discussed option to consider ESWL versus continued medical expulsive therapy; he elected to continue MET with Flomax 0.4 mg daily. Refill sent for Percocet PRN for pain management. Since stone was not visible on KUB, will recheck with CT stone in 2 weeks. Pt verbalized understanding and agreement. All questions were answered.  PLAN Advised the following: Continue Flomax 0.4 mg daily.  Percocet PRN as prescribed.  Return in about 2 weeks (around 08/28/2023) for UA, PVR, & f/u with Evette Georges NP with CT stone 1-2 days prior.  Orders Placed This Encounter  Procedures   CT RENAL STONE STUDY    Standing Status:   Future    Standing Expiration Date:   08/13/2024    Order Specific Question:   Preferred imaging location?    Answer:   [facility]    It has been explained that the patient is to follow regularly with their PCP in addition to all other providers involved in their care and to follow instructions provided by these respective offices. Patient advised to contact urology clinic if any urologic-pertaining questions, concerns, new symptoms or problems arise in the interim period.  There are no Patient Instructions on file for this visit.  Electronically signed by:  Donnita Falls, MSN, FNP-C, CUNP 08/14/2023 2:27 PM

## 2023-08-04 NOTE — Telephone Encounter (Signed)
Patient is aware of NP's response, he will call if follow up needs to be moved out.

## 2023-08-04 NOTE — Telephone Encounter (Signed)
-----   Message from Donnita Falls sent at 08/04/2023  2:13 PM EDT ----- Please let pt know that the formal radiology interpretation of his KUB from last week showed no GU stones; nothing to explain his persistent left flank pain. CT stone ordered for further evaluation. He should get that done before next OV here - reschedule if needed (currently scheduled for 10/28 follow up). Thanks.

## 2023-08-09 NOTE — Progress Notes (Incomplete)
[facility] [address] [place] [address]    Clinic Day:  08/10/2023  Referring physician: Billie Lade, MD  Patient Care Team: Billie Lade, MD as PCP - General (Internal Medicine) Jonelle Sidle, MD as PCP - Cardiology (Cardiology) Jonelle Sidle, MD as Consulting Physician (Cardiology) Mickie Bail, RN as Oncology Nurse Navigator   ASSESSMENT & PLAN:   Assessment: 1.  NLP Hodgkin's disease: -6 cycles of R-CHOP from 10/23/2019 through 02/04/2020. -PET scan on 03/25/2020 showed most of the lymph nodes with Deauville 2 activity with normal size limits.  2 of the right external iliac lymph nodes remain mildly enlarged.  Right thigh mass also normalized.    Plan: 1.  NLP Hodgkin's disease: - He denies any B symptoms. - He had perineal fistulotomy on 11/14/2022 which healed completely around March. - Reviewed PET scan from 03/30/2023: Stable cervical, axillary and upper abdominal lymphadenopathy.  Progressive pelvic lymphadenopathy, 2 cm left external iliac node, previously 1.7 cm with SUV 10.2.  Right external iliac node 2.1 cm, previously 1.8 cm with SUV 5.3, previously 2.0.  Clustered right inguinal lymph nodes measuring up to 1.3 cm short axis with SUV 5.9, previously 5.5. - Labs: Normal LFTs and LDH.  CBC grossly normal. 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  As he is feeling fine, I have recommended that we will repeat a PET scan in 4 months rather than going ahead with biopsy.  He is agreeable. - RTC 4 months with repeat labs and PET scan.    No orders of the defined types were placed in this encounter.     Alben Deeds Teague,acting as a Neurosurgeon for Doreatha Massed, MD.,have documented all relevant documentation on the behalf of Doreatha Massed, MD,as directed by  Doreatha Massed, MD while in the presence of Doreatha Massed, MD.  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 male presenting to clinic today for follow up of Hodgkin's lymphoma. He was last seen by me on 04/06/23.  Since his last visit, he underwent surveillance PET on 08/03/23 that found: similar to minimally decreased hypermetabolism within lymph nodes in the left axilla, upper abdomen, pelvic retroperitoneum and inguinal regions (Deauville 3-4); small hypometabolic cervical lymph nodes (Deauville 2); persistent 4 mm mid left ureteral stone without hydronephrosis; and punctate right renal stone.  He presented to the ED on 05/29/23 for chest wall pain. X ray was negative for rib fracture and  KKG found no evidence of acute arrhythmia or ischemia. He was given Toradol while hospitalized and discharged with ibuprofen 600 mg q6h prn. He was seen again in the ED on 06/22/23 for ureteral colic with left-sided hydronephrosis and no UTI. He was discharged with Flomax and Percocet  Today, he states that he is doing well overall. His appetite level is at ***%. His energy level  is at ***%.  PAST MEDICAL HISTORY:   Past Medical History: Past Medical History:  Diagnosis Date   Asthma    Cancer (HCC)    stage 3 hodgkins lymphoma   Coronary atherosclerosis of native coronary artery    BMS to mid circumflex 11/2011, LVEF 55-60%   Essential hypertension    Migraine    Mixed hyperlipidemia    STEMI (ST elevation myocardial infarction) (HCC)    Complicated by VF arrest 11/2011    Surgical History: Past Surgical History:  Procedure Laterality Date   AXILLARY LYMPH NODE BIOPSY Left 07/26/2019   Procedure: AXILLARY LYMPH NODE BIOPSY;  Surgeon: Franky Macho, MD;  Location: [facility];  Service: General;  Laterality: Left;   BACK SURGERY     CARDIAC CATHETERIZATION     CHOLECYSTECTOMY N/A 08/26/2019   Procedure: LAPAROSCOPIC CHOLECYSTECTOMY;  Surgeon: Franky Macho, MD;  Location: [facility];  Service: General;  Laterality: N/A;   FISTULOTOMY N/A 11/14/2022   Procedure: FISTULOTOMY;  Surgeon: Franky Macho, MD;  Location: [facility];  Service: General;  Laterality: N/A;   INGUINAL LYMPH NODE BIOPSY Right 05/26/2021   Procedure: INGUINAL LYMPH NODE BIOPSY;  Surgeon: Franky Macho, MD;  Location: [facility];  Service: General;  Laterality: Right;   LEFT HEART CATHETERIZATION WITH CORONARY ANGIOGRAM N/A 11/21/2011   Procedure: LEFT HEART CATHETERIZATION WITH CORONARY ANGIOGRAM;  Surgeon: Peter M Swaziland, MD;  Location: [facility] CATH LAB;  Service: Cardiovascular;  Laterality: N/A;   LYMPH NODE BIOPSY Right 08/26/2019   Procedure: LYMPH NODE BIOPSY, INGUINAL;  Surgeon: Franky Macho, MD;  Location: [facility];  Service: General;  Laterality: Right;   PERCUTANEOUS CORONARY STENT INTERVENTION (PCI-S) N/A 11/21/2011   Procedure: PERCUTANEOUS CORONARY STENT INTERVENTION (PCI-S);  Surgeon: Peter M Swaziland, MD;  Location: [facility] C[facility] LAB;  Service: Cardiovascular;  Laterality: N/A;   PORT-A-CATH REMOVAL N/A 12/27/2021   Procedure: MINOR REMOVAL PORT-A-CATH;  Surgeon: Franky Macho, MD;  Location: [facility];  Service:  General;  Laterality: N/A;   PORTACATH PLACEMENT Left 10/01/2019   Procedure: INSERTION PORT-A-CATH (catheter attached left subclavian);  Surgeon: Franky Macho, MD;  Location: [facility];  Service: General;  Laterality: Left;   SPLENECTOMY      Social History: Social History   Socioeconomic History   Marital status: Divorced    Spouse name: Not on file   Number of children: 2   Years of education: Not on file   Highest education level: Not on file  Occupational History   Occupation: not employed  Tobacco Use   Smoking status: Every Day    Current packs/day: 0.50    Average packs/day: 1 pack/day for 20.8 years (20.4 ttl pk-yrs)    Types: Cigarettes    Start date: 2024   Smokeless tobacco: Never  Vaping Use   Vaping status: Every Day   Start date: 04/27/2012  Substance and Sexual Activity   Alcohol use: Not Currently    Comment: Occasionally   Drug use: No   Sexual activity: Yes    Birth control/protection: Rhythm  Other Topics Concern   Not on file  Social History  Narrative   Not on file   Social Determinants of Health   Financial Resource Strain: Low Risk  (09/23/2020)   Overall Financial Resource Strain (CARDIA)    Difficulty of Paying Living Expenses: Not hard at all  Food Insecurity: No Food Insecurity (09/23/2020)   Hunger Vital Sign    Worried About Running Out of Food in the Last Year: Never true    Ran Out of Food in the Last Year: Never true  Transportation Needs: No Transportation Needs (09/23/2020)   PRAPARE - Administrator, Civil Service (Medical): No    Lack of Transportation (Non-Medical): No  Physical Activity: Unknown (06/27/2019)   Exercise Vital Sign    Days of Exercise per Week: 3 days    Minutes of Exercise per Session: Not on file  Recent Concern: Physical Activity - Insufficiently Active (06/15/2019)   Exercise Vital Sign    Days of Exercise per Week: 2 days    Minutes of Exercise per Session: 30 min  Stress: No Stress Concern Present  (09/23/2020)   [employer] of Occupational Health - Occupational Stress Questionnaire    Feeling of Stress : Not at all  Social Connections: Socially Isolated (09/23/2020)   Social Connection and Isolation Panel [NHANES]    Frequency of Communication with Friends and Family: More than three times a week    Frequency of Social Gatherings with Friends and Family: Three times a week    Attends Religious Services: Never    Active Member of Clubs or Organizations: No    Attends Banker Meetings: Never    Marital Status: Divorced  Catering manager Violence: Not At Risk (09/23/2020)   Humiliation, Afraid, Rape, and Kick questionnaire    Fear of Current or Ex-Partner: No    Emotionally Abused: No    Physically Abused: No    Sexually Abused: No    Family History: Family History  Problem Relation Age of Onset   Diabetes Mother    Malignant hyperthermia Mother    Cancer Mother        throat cancer   Heart disease Father    Cancer Father    Malignant hyperthermia Brother    Heart disease Brother    Asthma Brother    COPD Brother    ADD / ADHD Daughter    ADD / ADHD Son     Current Medications:  Current Outpatient Medications:    albuterol (PROVENTIL HFA;VENTOLIN HFA) 108 (90 Base) MCG/ACT inhaler, Inhale 2 puffs into the lungs every 6 (six) hours as needed for wheezing or shortness of breath., Disp: , Rfl:    aspirin EC 81 MG tablet, Take 81 mg by mouth daily. Swallow whole., Disp: , Rfl:    atorvastatin (LIPITOR) 40 MG tablet, Take 1 tablet (40 mg total) by mouth daily., Disp: 90 tablet, Rfl: 3   Cholecalciferol 1.25 MG (50000 UT) capsule, Take 1 capsule (50,000 Units total) by mouth once a week., Disp: 8 capsule, Rfl: 0   hydrOXYzine (VISTARIL) 50 MG capsule, TAKE 1 CAPSULE(50 MG) BY MOUTH AT BEDTIME AS NEEDED, Disp: 30 capsule, Rfl: 0   ibuprofen (ADVIL) 200 MG tablet, Take 800 mg by mouth every 8 (eight) hours as needed (headache)., Disp: , Rfl:    ibuprofen  (ADVIL) 600 MG tablet, Take 1 tablet (600 mg total) by mouth every 6 (six) hours as needed., Disp: 30 tablet, Rfl: 0   lidocaine-prilocaine (EMLA) cream, Apply 1 application  topically once. Prior to procedure., Disp: ,  Rfl:    omeprazole (PRILOSEC) 20 MG capsule, Take 1 capsule (20 mg total) by mouth daily., Disp: 30 capsule, Rfl: 3   tamsulosin (FLOMAX) 0.4 MG CAPS capsule, Take 1 capsule (0.4 mg total) by mouth daily., Disp: 30 capsule, Rfl: 0   Allergies: Allergies  Allergen Reactions   Penicillins Anaphylaxis    Did it involve swelling of the face/tongue/throat, SOB, or low BP? Yes Did it involve sudden or severe rash/hives, skin peeling, or any reaction on the inside of your mouth or nose? No Did you need to seek medical attention at a hospital or doctor's office? Yes When did it last happen?  Childhood allergy     If all above answers are "NO", may proceed with cephalosporin use.     REVIEW OF SYSTEMS:   Review of Systems  Constitutional:  Negative for chills, fatigue and fever.  HENT:   Negative for lump/mass, mouth sores, nosebleeds, sore throat and trouble swallowing.   Eyes:  Negative for eye problems.  Respiratory:  Negative for cough and shortness of breath.   Cardiovascular:  Negative for chest pain, leg swelling and palpitations.  Gastrointestinal:  Negative for abdominal pain, constipation, diarrhea, nausea and vomiting.  Genitourinary:  Negative for bladder incontinence, difficulty urinating, dysuria, frequency, hematuria and nocturia.   Musculoskeletal:  Negative for arthralgias, back pain, flank pain, myalgias and neck pain.  Skin:  Negative for itching and rash.  Neurological:  Negative for dizziness, headaches and numbness.  Hematological:  Does not bruise/bleed easily.  Psychiatric/Behavioral:  Negative for depression, sleep disturbance and suicidal ideas. The patient is not nervous/anxious.   All other systems reviewed and are negative.    VITALS:   There  were no vitals taken for this visit.  Wt Readings from Last 3 Encounters:  06/29/23 274 lb 3.2 oz (124.4 kg)  05/29/23 280 lb (127 kg)  05/09/23 280 lb (127 kg)    There is no height or weight on file to calculate BMI.  Performance status (ECOG): 1 - Symptomatic but completely ambulatory  PHYSICAL EXAM:   Physical Exam Vitals and nursing note reviewed. Exam conducted with a chaperone present.  Constitutional:      Appearance: Normal appearance.  Cardiovascular:     Rate and Rhythm: Normal rate and regular rhythm.     Pulses: Normal pulses.     Heart sounds: Normal heart sounds.  Pulmonary:     Effort: Pulmonary effort is normal.     Breath sounds: Normal breath sounds.  Abdominal:     Palpations: Abdomen is soft. There is no hepatomegaly, splenomegaly or mass.     Tenderness: There is no abdominal tenderness.  Musculoskeletal:     Right lower leg: No edema.     Left lower leg: No edema.  Lymphadenopathy:     Cervical: No cervical adenopathy.     Right cervical: No superficial, deep or posterior cervical adenopathy.    Left cervical: No superficial, deep or posterior cervical adenopathy.     Upper Body:     Right upper body: No supraclavicular or axillary adenopathy.     Left upper body: No supraclavicular or axillary adenopathy.  Neurological:     General: No focal deficit present.     Mental Status: He is alert and oriented to person, place, and time.  Psychiatric:        Mood and Affect: Mood normal.        Behavior: Behavior normal.     LABS:  Latest Ref Rng & Units 08/03/2023   10:13 AM 06/22/2023    3:36 AM 05/29/2023   10:11 PM  CBC  WBC 4.0 - 10.5 K/uL 12.1  13.8  11.5   Hemoglobin 13.0 - 17.0 g/dL 16.1  09.6  04.5   Hematocrit 39.0 - 52.0 % 43.3  45.2  42.4   Platelets 150 - 400 K/uL 297  328  302       Latest Ref Rng & Units 08/03/2023   10:13 AM 06/22/2023    3:36 AM 05/29/2023   10:11 PM  CMP  Glucose 70 - 99 mg/dL 409  811  914   BUN 6 - 20  mg/dL 10  11  8    Creatinine 0.61 - 1.24 mg/dL 7.82  9.56  2.13   Sodium 135 - 145 mmol/L 138  138  139   Potassium 3.5 - 5.1 mmol/L 4.1  3.8  4.0   Chloride 98 - 111 mmol/L 106  104  107   CO2 22 - 32 mmol/L 25  25  24    Calcium 8.9 - 10.3 mg/dL 8.5  8.9  9.0   Total Protein 6.5 - 8.1 g/dL 6.8   6.7   Total Bilirubin 0.3 - 1.2 mg/dL 0.5   0.9   Alkaline Phos 38 - 126 U/L 116   101   AST 15 - 41 U/L 29   32   ALT 0 - 44 U/L 37   40      No results found for: "CEA1", "CEA" / No results found for: "CEA1", "CEA" No results found for: "PSA1" No results found for: "YQM578" No results found for: "CAN125"  No results found for: "TOTALPROTELP", "ALBUMINELP", "A1GS", "A2GS", "BETS", "BETA2SER", "GAMS", "MSPIKE", "SPEI" No results found for: "TIBC", "FERRITIN", "IRONPCTSAT" Lab Results  Component Value Date   LDH 101 08/03/2023   LDH 98 03/30/2023   LDH 97 (L) 09/29/2022     STUDIES:   NM PET Image Restag (PS) Skull Base To Thigh  Result Date: 08/09/2023 CLINICAL DATA:  Subsequent treatment strategy for Hodgkin lymphoma. EXAM: NUCLEAR MEDICINE PET SKULL BASE TO THIGH TECHNIQUE: 14.7 mCi F-18 FDG was injected intravenously. Full-ring PET imaging was performed from the skull base to thigh after the radiotracer. CT data was obtained and used for attenuation correction and anatomic localization. Fasting blood glucose: 109 mg/dl COMPARISON:  CT abdomen pelvis 06/22/2023 and PET 03/30/2023. FINDINGS: Mediastinal blood pool activity: SUV max 1.7 Liver activity: SUV max 3.1 NECK: Persistent mildly asymmetric nasopharyngeal hypermetabolism as well as symmetric tonsillar hypermetabolism, as before. No hypermetabolic lymph nodes. Index left level 2 lymph node measures 5 mm (202/29), SUV max 1.5. Incidental CT findings: None. CHEST: Hypermetabolic left axillary lymph nodes measure up to 1.5 cm (202/52), SUV max 3.1 compared to 4.4 previously. No additional abnormal hypermetabolism. Incidental CT  findings: Heart is at the upper limits of normal in size to mildly enlarged. No pericardial or pleural effusion. ABDOMEN/PELVIS: Abdominal peritoneal ligament lymph nodes measure up to 1.4 cm in the periportal region (202/91), SUV max 2.8, similar. Pelvic retroperitoneal lymph nodes measure up to 2.1 cm in the left external iliac station (202/156), SUV max 4.0, decreased from 10.2. Hypermetabolic right inguinal lymph nodes measure up to 1.4 cm (202/157), SUV max 4.6 previously 5.9. Incidental CT findings: Liver and adrenal glands are unremarkable. Punctate right renal stone. 4 mm mid left ureteral stone without hydronephrosis. Kidneys are otherwise unremarkable. Spleen is absent. Pancreas, stomach and bowel are grossly unremarkable.  Cholecystectomy. SKELETON: No abnormal hypermetabolism. Incidental CT findings: Postoperative changes in the lower lumbar spine. Degenerative changes in the spine. IMPRESSION: 1. Similar to minimally decreased hypermetabolism within lymph nodes in the left axilla, upper abdomen, pelvic retroperitoneum and inguinal regions (Deauville 3-4). 2. Small hypometabolic cervical lymph nodes (Deauville 2). 3. Persistent 4 mm mid left ureteral stone without hydronephrosis. 4. Punctate right renal stone. Electronically Signed   By: Leanna Battles M.D.   On: 08/09/2023 16:22   DG Abd 1 View  Result Date: 08/04/2023 CLINICAL DATA:  Nephrolithiasis. EXAM: ABDOMEN - 1 VIEW COMPARISON:  07/14/2023. FINDINGS: The bowel gas pattern is normal. No radio-opaque calculi or other significant radiographic abnormality are seen. IMPRESSION: Negative. Left ureteral and right kidney stones are not seen currently. Noncontrast CT would be more sensitive, if indicated. Electronically Signed   By: Layla Maw M.D.   On: 08/04/2023 13:49   DG Abd 1 View  Result Date: 08/04/2023 CLINICAL DATA:  Mid left ureteral and right kidney stones on CT scan EXAM: ABDOMEN - 1 VIEW COMPARISON:  CT 06/22/2023. FINDINGS:  The bowel gas pattern is normal. Punctate calcification overlies the right kidney. 3 mm calcification overlies the mid left ureter. Postop changes in the L4-5 level with discectomy and fusion. IMPRESSION: Mid left ureteral and right kidney stones are barely perceptible on plain film. Electronically Signed   By: Layla Maw M.D.   On: 08/04/2023 13:47

## 2023-08-10 ENCOUNTER — Inpatient Hospital Stay: Payer: Medicaid Other | Admitting: Hematology

## 2023-08-10 ENCOUNTER — Other Ambulatory Visit: Payer: Self-pay | Admitting: Urology

## 2023-08-10 DIAGNOSIS — R109 Unspecified abdominal pain: Secondary | ICD-10-CM

## 2023-08-10 DIAGNOSIS — N200 Calculus of kidney: Secondary | ICD-10-CM

## 2023-08-10 DIAGNOSIS — N201 Calculus of ureter: Secondary | ICD-10-CM

## 2023-08-13 ENCOUNTER — Ambulatory Visit (HOSPITAL_COMMUNITY)
Admission: RE | Admit: 2023-08-13 | Discharge: 2023-08-13 | Disposition: A | Discharge status: Home / Self Care | Payer: Medicaid Other | Source: Ambulatory Visit | Attending: Urology | Admitting: Urology

## 2023-08-13 DIAGNOSIS — N200 Calculus of kidney: Secondary | ICD-10-CM | POA: Diagnosis not present

## 2023-08-13 DIAGNOSIS — Z9081 Acquired absence of spleen: Secondary | ICD-10-CM | POA: Diagnosis not present

## 2023-08-13 DIAGNOSIS — R109 Unspecified abdominal pain: Secondary | ICD-10-CM | POA: Insufficient documentation

## 2023-08-13 DIAGNOSIS — N201 Calculus of ureter: Secondary | ICD-10-CM | POA: Diagnosis not present

## 2023-08-13 DIAGNOSIS — Z9049 Acquired absence of other specified parts of digestive tract: Secondary | ICD-10-CM | POA: Diagnosis not present

## 2023-08-14 ENCOUNTER — Encounter: Payer: Self-pay | Admitting: Urology

## 2023-08-14 ENCOUNTER — Ambulatory Visit: Payer: Medicaid Other | Admitting: Urology

## 2023-08-14 ENCOUNTER — Inpatient Hospital Stay (HOSPITAL_BASED_OUTPATIENT_CLINIC_OR_DEPARTMENT_OTHER): Payer: Medicaid Other | Admitting: Hematology

## 2023-08-14 VITALS — BP 108/72 | HR 72 | Temp 97.5°F

## 2023-08-14 VITALS — BP 117/85 | HR 68 | Temp 97.7°F | Resp 16 | Wt 282.8 lb

## 2023-08-14 DIAGNOSIS — C8108 Nodular lymphocyte predominant Hodgkin lymphoma, lymph nodes of multiple sites: Secondary | ICD-10-CM | POA: Insufficient documentation

## 2023-08-14 DIAGNOSIS — N201 Calculus of ureter: Secondary | ICD-10-CM

## 2023-08-14 DIAGNOSIS — C8190 Hodgkin lymphoma, unspecified, unspecified site: Secondary | ICD-10-CM

## 2023-08-14 DIAGNOSIS — R109 Unspecified abdominal pain: Secondary | ICD-10-CM | POA: Diagnosis not present

## 2023-08-14 MED ORDER — OXYCODONE-ACETAMINOPHEN 5-325 MG PO TABS: 1.0000 | ORAL_TABLET | Freq: Four times a day (QID) | ORAL | 0 refills | Status: AC | PRN

## 2023-08-14 NOTE — Progress Notes (Signed)
[facility] [address] [place] [address]    Clinic Day:  08/14/2023  Referring physician: Billie Lade, MD  Patient Care Team: Billie Lade, MD as PCP - General (Internal Medicine) Jonelle Sidle, MD as PCP - Cardiology (Cardiology) Jonelle Sidle, MD as Consulting Physician (Cardiology) Mickie Bail, RN as Oncology Nurse Navigator   ASSESSMENT & PLAN:   Assessment: 1.  NLP Hodgkin's disease: -6 cycles of R-CHOP from 10/23/2019 through 02/04/2020. -PET scan on 03/25/2020 showed most of the lymph nodes with Deauville 2 activity with normal size limits.  2 of the right external iliac lymph nodes remain mildly enlarged.  Right thigh mass also normalized.    Plan: 1.  NLP Hodgkin's disease: - He denies any B symptoms. - He is dealing with a left-sided kidney stone and follows up with urology. - Reviewed labs from 08/03/2023: Normal LFTs and LDH.  CBC grossly normal with mild leukocytosis. - PET scan (08/03/2023): Hypermetabolic left axillary lymph node 1.5 cm SUV 3.1 compared to 4.4 previously.  Similar to minimally decreased hypermetabolic within the lymph nodes in the left axilla, upper abdomen, pelvic retroperitoneum and inguinal lymph nodes, Deauville 3-4.  Small hypometabolic cervical lymph nodes, Deauville 2. - Recommend follow-up in 6 months with repeat CT CAP and labs.    No orders of the defined types were placed in this encounter.      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  Denies any fevers, night sweats or weight loss.  Reports appetite and energy levels of 100%.  PAST MEDICAL HISTORY:   Past Medical History: Past Medical History:  Diagnosis Date   Asthma    Cancer (HCC)    stage 3 hodgkins lymphoma   Coronary atherosclerosis of native coronary artery    BMS to mid circumflex 11/2011, LVEF 55-60%   Essential hypertension    Migraine    Mixed hyperlipidemia    STEMI (ST elevation myocardial infarction) (HCC)    Complicated by VF arrest 11/2011    Surgical History: Past Surgical History:  Procedure Laterality Date   AXILLARY LYMPH NODE BIOPSY Left 07/26/2019   Procedure: AXILLARY LYMPH NODE BIOPSY;  Surgeon: Franky Macho, MD;  Location: [facility];  Service: General;  Laterality: Left;   BACK SURGERY     CARDIAC CATHETERIZATION     CHOLECYSTECTOMY N/A 08/26/2019   Procedure: LAPAROSCOPIC CHOLECYSTECTOMY;  Surgeon: Franky Macho, MD;  Location: [facility];  Service: General;  Laterality: N/A;  FISTULOTOMY N/A 11/14/2022   Procedure: FISTULOTOMY;  Surgeon: Franky Macho, MD;  Location: [facility];  Service: General;  Laterality: N/A;   INGUINAL LYMPH NODE BIOPSY Right 05/26/2021   Procedure: INGUINAL LYMPH NODE BIOPSY;  Surgeon: Franky Macho, MD;  Location: [facility];  Service: General;  Laterality: Right;   LEFT HEART CATHETERIZATION WITH CORONARY ANGIOGRAM N/A 11/21/2011   Procedure: LEFT HEART CATHETERIZATION WITH CORONARY  ANGIOGRAM;  Surgeon: Peter M Swaziland, MD;  Location: [facility] CATH LAB;  Service: Cardiovascular;  Laterality: N/A;   LYMPH NODE BIOPSY Right 08/26/2019   Procedure: LYMPH NODE BIOPSY, INGUINAL;  Surgeon: Franky Macho, MD;  Location: [facility];  Service: General;  Laterality: Right;   PERCUTANEOUS CORONARY STENT INTERVENTION (PCI-S) N/A 11/21/2011   Procedure: PERCUTANEOUS CORONARY STENT INTERVENTION (PCI-S);  Surgeon: Peter M Swaziland, MD;  Location: [facility] CATH LAB;  Service: Cardiovascular;  Laterality: N/A;   PORT-A-CATH REMOVAL N/A 12/27/2021   Procedure: MINOR REMOVAL PORT-A-CATH;  Surgeon: Franky Macho, MD;  Location: [facility];  Service: General;  Laterality: N/A;   PORTACATH PLACEMENT Left 10/01/2019   Procedure: INSERTION PORT-A-CATH (catheter attached left subclavian);  Surgeon: Franky Macho, MD;  Location: [facility];  Service: General;  Laterality: Left;   SPLENECTOMY      Social History: Social History   Socioeconomic History   Marital status: Divorced    Spouse name: Not on file   Number of children: 2   Years of education: Not on file   Highest education level: Not on file  Occupational History   Occupation: not employed  Tobacco Use   Smoking status: Every Day    Current packs/day: 0.50    Average packs/day: 1 pack/day for 20.8 years (20.4 ttl pk-yrs)    Types: Cigarettes    Start date: 2024   Smokeless tobacco: Never  Vaping Use   Vaping status: Every Day   Start date: 04/27/2012  Substance and Sexual Activity   Alcohol use: Not Currently    Comment: Occasionally   Drug use: No   Sexual activity: Yes    Birth control/protection: Rhythm  Other Topics Concern   Not on file  Social History Narrative   Not on file   Social Determinants of Health   Financial Resource Strain: Low Risk  (09/23/2020)   Overall Financial Resource Strain (CARDIA)    Difficulty of Paying Living Expenses: Not hard at all  Food Insecurity: No Food Insecurity (09/23/2020)   Hunger Vital Sign    Worried  About Running Out of Food in the Last Year: Never true    Ran Out of Food in the Last Year: Never true  Transportation Needs: No Transportation Needs (09/23/2020)   PRAPARE - Administrator, Civil Service (Medical): No    Lack of Transportation (Non-Medical): No  Physical Activity: Unknown (06/27/2019)   Exercise Vital Sign    Days of Exercise per Week: 3 days    Minutes of Exercise per Session: Not on file  Recent Concern: Physical Activity - Insufficiently Active (06/15/2019)   Exercise Vital Sign    Days of Exercise per Week: 2 days    Minutes of Exercise per Session: 30 min  Stress: No Stress Concern Present (09/23/2020)   [employer] of Occupational Health - Occupational Stress Questionnaire    Feeling of Stress : Not at all  Social Connections: Socially Isolated (09/23/2020)   Social Connection and Isolation Panel [NHANES]    Frequency of Communication with Friends and Family: More than three times  a week    Frequency of Social Gatherings with Friends and Family: Three times a week    Attends Religious Services: Never    Active Member of Clubs or Organizations: No    Attends Banker Meetings: Never    Marital Status: Divorced  Catering manager Violence: Not At Risk (09/23/2020)   Humiliation, Afraid, Rape, and Kick questionnaire    Fear of Current or Ex-Partner: No    Emotionally Abused: No    Physically Abused: No    Sexually Abused: No    Family History: Family History  Problem Relation Age of Onset   Diabetes Mother    Malignant hyperthermia Mother    Cancer Mother        throat cancer   Heart disease Father    Cancer Father    Malignant hyperthermia Brother    Heart disease Brother    Asthma Brother    COPD Brother    ADD / ADHD Daughter    ADD / ADHD Son     Current Medications:  Current Outpatient Medications:    albuterol (PROVENTIL HFA;VENTOLIN HFA) 108 (90 Base) MCG/ACT inhaler, Inhale 2 puffs into the lungs every 6 (six)  hours as needed for wheezing or shortness of breath., Disp: , Rfl:    aspirin EC 81 MG tablet, Take 81 mg by mouth daily. Swallow whole., Disp: , Rfl:    atorvastatin (LIPITOR) 40 MG tablet, Take 1 tablet (40 mg total) by mouth daily., Disp: 90 tablet, Rfl: 3   Cholecalciferol 1.25 MG (50000 UT) capsule, Take 1 capsule (50,000 Units total) by mouth once a week., Disp: 8 capsule, Rfl: 0   hydrOXYzine (VISTARIL) 50 MG capsule, TAKE 1 CAPSULE(50 MG) BY MOUTH AT BEDTIME AS NEEDED, Disp: 30 capsule, Rfl: 0   ibuprofen (ADVIL) 200 MG tablet, Take 800 mg by mouth every 8 (eight) hours as needed (headache)., Disp: , Rfl:    ibuprofen (ADVIL) 600 MG tablet, Take 1 tablet (600 mg total) by mouth every 6 (six) hours as needed., Disp: 30 tablet, Rfl: 0   lidocaine-prilocaine (EMLA) cream, Apply 1 application  topically once. Prior to procedure., Disp: , Rfl:    omeprazole (PRILOSEC) 20 MG capsule, Take 1 capsule (20 mg total) by mouth daily., Disp: 30 capsule, Rfl: 3   oxyCODONE-acetaminophen (PERCOCET/ROXICET) 5-325 MG tablet, Take 1 tablet by mouth every 6 (six) hours as needed for up to 5 days for severe pain (pain score 7-10)., Disp: 20 tablet, Rfl: 0   tamsulosin (FLOMAX) 0.4 MG CAPS capsule, TAKE 1 CAPSULE(0.4 MG) BY MOUTH DAILY, Disp: 30 capsule, Rfl: 0   Allergies: Allergies  Allergen Reactions   Penicillins Anaphylaxis    Did it involve swelling of the face/tongue/throat, SOB, or low BP? Yes Did it involve sudden or severe rash/hives, skin peeling, or any reaction on the inside of your mouth or nose? No Did you need to seek medical attention at a hospital or doctor's office? Yes When did it last happen?  Childhood allergy     If all above answers are "NO", may proceed with cephalosporin use.     REVIEW OF SYSTEMS:   Review of Systems  Constitutional:  Negative for chills, fatigue and fever.  HENT:   Negative for lump/mass, mouth sores, nosebleeds, sore throat and trouble swallowing.   Eyes:   Negative for eye problems.  Respiratory:  Negative for cough.   Cardiovascular:  Negative for chest pain, leg swelling and palpitations.  Gastrointestinal:  Negative for  abdominal pain, constipation, diarrhea, nausea and vomiting.  Genitourinary:  Positive for hematuria. Negative for bladder incontinence, difficulty urinating, dysuria, frequency and nocturia.   Musculoskeletal:  Negative for arthralgias, back pain, flank pain, myalgias and neck pain.  Skin:  Negative for itching and rash.  Neurological:  Negative for dizziness, headaches and numbness.  Hematological:  Does not bruise/bleed easily.  Psychiatric/Behavioral:  Negative for depression, sleep disturbance and suicidal ideas. The patient is not nervous/anxious.   All other systems reviewed and are negative.    VITALS:   Blood pressure 117/85, pulse 68, temperature 97.7 F (36.5 C), temperature source Oral, resp. rate 16, weight 282 lb 12.8 oz (128.3 kg), SpO2 96%.  Wt Readings from Last 3 Encounters:  08/14/23 282 lb 12.8 oz (128.3 kg)  06/29/23 274 lb 3.2 oz (124.4 kg)  05/29/23 280 lb (127 kg)    Body mass index is 39.44 kg/m.  Performance status (ECOG): 1 - Symptomatic but completely ambulatory  PHYSICAL EXAM:   Physical Exam Vitals and nursing note reviewed. Exam conducted with a chaperone present.  Constitutional:      Appearance: Normal appearance.  Cardiovascular:     Rate and Rhythm: Normal rate and regular rhythm.     Pulses: Normal pulses.     Heart sounds: Normal heart sounds.  Pulmonary:     Effort: Pulmonary effort is normal.     Breath sounds: Normal breath sounds.  Abdominal:     Palpations: Abdomen is soft. There is no hepatomegaly, splenomegaly or mass.     Tenderness: There is no abdominal tenderness.  Musculoskeletal:     Right lower leg: No edema.     Left lower leg: No edema.  Lymphadenopathy:     Cervical: No cervical adenopathy.     Right cervical: No superficial, deep or posterior  cervical adenopathy.    Left cervical: No superficial, deep or posterior cervical adenopathy.     Upper Body:     Right upper body: No supraclavicular or axillary adenopathy.     Left upper body: No supraclavicular or axillary adenopathy.  Neurological:     General: No focal deficit present.     Mental Status: He is alert and oriented to person, place, and time.  Psychiatric:        Mood and Affect: Mood normal.        Behavior: Behavior normal.     LABS:      Latest Ref Rng & Units 08/03/2023   10:13 AM 06/22/2023    3:36 AM 05/29/2023   10:11 PM  CBC  WBC 4.0 - 10.5 K/uL 12.1  13.8  11.5   Hemoglobin 13.0 - 17.0 g/dL 19.1  47.8  29.5   Hematocrit 39.0 - 52.0 % 43.3  45.2  42.4   Platelets 150 - 400 K/uL 297  328  302       Latest Ref Rng & Units 08/03/2023   10:13 AM 06/22/2023    3:36 AM 05/29/2023   10:11 PM  CMP  Glucose 70 - 99 mg/dL 621  308  657   BUN 6 - 20 mg/dL 10  11  8    Creatinine 0.61 - 1.24 mg/dL 8.46  9.62  9.52   Sodium 135 - 145 mmol/L 138  138  139   Potassium 3.5 - 5.1 mmol/L 4.1  3.8  4.0   Chloride 98 - 111 mmol/L 106  104  107   CO2 22 - 32 mmol/L 25  25  24  Calcium 8.9 - 10.3 mg/dL 8.5  8.9  9.0   Total Protein 6.5 - 8.1 g/dL 6.8   6.7   Total Bilirubin 0.3 - 1.2 mg/dL 0.5   0.9   Alkaline Phos 38 - 126 U/L 116   101   AST 15 - 41 U/L 29   32   ALT 0 - 44 U/L 37   40      No results found for: "CEA1", "CEA" / No results found for: "CEA1", "CEA" No results found for: "PSA1" No results found for: "YNW295" No results found for: "CAN125"  No results found for: "TOTALPROTELP", "ALBUMINELP", "A1GS", "A2GS", "BETS", "BETA2SER", "GAMS", "MSPIKE", "SPEI" No results found for: "TIBC", "FERRITIN", "IRONPCTSAT" Lab Results  Component Value Date   LDH 101 08/03/2023   LDH 98 03/30/2023   LDH 97 (L) 09/29/2022     STUDIES:   NM PET Image Restag (PS) Skull Base To Thigh  Result Date: 08/09/2023 CLINICAL DATA:  Subsequent treatment strategy for  Hodgkin lymphoma. EXAM: NUCLEAR MEDICINE PET SKULL BASE TO THIGH TECHNIQUE: 14.7 mCi F-18 FDG was injected intravenously. Full-ring PET imaging was performed from the skull base to thigh after the radiotracer. CT data was obtained and used for attenuation correction and anatomic localization. Fasting blood glucose: 109 mg/dl COMPARISON:  CT abdomen pelvis 06/22/2023 and PET 03/30/2023. FINDINGS: Mediastinal blood pool activity: SUV max 1.7 Liver activity: SUV max 3.1 NECK: Persistent mildly asymmetric nasopharyngeal hypermetabolism as well as symmetric tonsillar hypermetabolism, as before. No hypermetabolic lymph nodes. Index left level 2 lymph node measures 5 mm (202/29), SUV max 1.5. Incidental CT findings: None. CHEST: Hypermetabolic left axillary lymph nodes measure up to 1.5 cm (202/52), SUV max 3.1 compared to 4.4 previously. No additional abnormal hypermetabolism. Incidental CT findings: Heart is at the upper limits of normal in size to mildly enlarged. No pericardial or pleural effusion. ABDOMEN/PELVIS: Abdominal peritoneal ligament lymph nodes measure up to 1.4 cm in the periportal region (202/91), SUV max 2.8, similar. Pelvic retroperitoneal lymph nodes measure up to 2.1 cm in the left external iliac station (202/156), SUV max 4.0, decreased from 10.2. Hypermetabolic right inguinal lymph nodes measure up to 1.4 cm (202/157), SUV max 4.6 previously 5.9. Incidental CT findings: Liver and adrenal glands are unremarkable. Punctate right renal stone. 4 mm mid left ureteral stone without hydronephrosis. Kidneys are otherwise unremarkable. Spleen is absent. Pancreas, stomach and bowel are grossly unremarkable. Cholecystectomy. SKELETON: No abnormal hypermetabolism. Incidental CT findings: Postoperative changes in the lower lumbar spine. Degenerative changes in the spine. IMPRESSION: 1. Similar to minimally decreased hypermetabolism within lymph nodes in the left axilla, upper abdomen, pelvic retroperitoneum and  inguinal regions (Deauville 3-4). 2. Small hypometabolic cervical lymph nodes (Deauville 2). 3. Persistent 4 mm mid left ureteral stone without hydronephrosis. 4. Punctate right renal stone. Electronically Signed   By: Leanna Battles M.D.   On: 08/09/2023 16:22   DG Abd 1 View  Result Date: 08/04/2023 CLINICAL DATA:  Nephrolithiasis. EXAM: ABDOMEN - 1 VIEW COMPARISON:  07/14/2023. FINDINGS: The bowel gas pattern is normal. No radio-opaque calculi or other significant radiographic abnormality are seen. IMPRESSION: Negative. Left ureteral and right kidney stones are not seen currently. Noncontrast CT would be more sensitive, if indicated. Electronically Signed   By: Layla Maw M.D.   On: 08/04/2023 13:49

## 2023-08-22 ENCOUNTER — Other Ambulatory Visit: Payer: Self-pay | Admitting: Urology

## 2023-08-22 DIAGNOSIS — N201 Calculus of ureter: Secondary | ICD-10-CM

## 2023-08-24 ENCOUNTER — Other Ambulatory Visit: Payer: Self-pay | Admitting: Internal Medicine

## 2023-08-24 DIAGNOSIS — F5101 Primary insomnia: Secondary | ICD-10-CM

## 2023-08-25 ENCOUNTER — Telehealth: Payer: Self-pay | Admitting: Urology

## 2023-08-25 NOTE — Progress Notes (Unsigned)
Name: EIJI BODLE DOB: [birth year] MRN: [id number]  History of Present Illness: Mr. Lenahan is a 42 y.o. male who presents today for follow up visit at [facility] [place]. - GU History: 1. Kidney stone x1.  Recent history:  > 06/22/2023: CT showed a 3 mm proximal left ureteral stone at the level of L2-3, with moderate obstructive uropathy. Also has a 2 mm nonobstructing stone in the inferior pole of the right kidney.   > 07/14/2023: KUB showed 3 mm mid left ureteral stone.   > 07/28/2023: KUB: "No radio-opaque calculi or other significant radiographic abnormality are seen." - Still symptomatic with intermittent left flank pain and LUTS.   > 08/03/2023: 4 mm mid left ureteral stone visualized on PET scan; no hydronephrosis.   > 08/13/2023: CT stone showed interval progression of 3 mm stone to distal left ureter.   Today: KUB today: Awaiting radiology read; ***  He {Actions; denies-reports:120008} stone passage. He {Actions; denies-reports:120008} flank pain or abdominal pain. He {Actions; denies-reports:120008} fevers, nausea, or vomiting.  He {Actions; denies-reports:120008} increased urinary urgency, frequency, nocturia, dysuria, gross hematuria, hesitancy, straining to void, or sensations of incomplete emptying.   Fall Screening: Do you usually have a device to assist in your mobility? {yes/no:20286}  ***cane / ***walker / ***wheelchair  Medications: Current Outpatient Medications  Medication Sig Dispense Refill   albuterol (PROVENTIL HFA;VENTOLIN HFA) 108 (90 Base) MCG/ACT inhaler Inhale 2 puffs into the lungs every 6 (six) hours as needed for wheezing or shortness of breath.     aspirin EC 81 MG tablet Take 81 mg by mouth daily. Swallow whole.     atorvastatin (LIPITOR) 40 MG tablet Take 1 tablet (40 mg total) by mouth daily. 90 tablet 3   Cholecalciferol 1.25 MG (50000 UT) capsule Take 1 capsule (50,000 Units total) by mouth once a week. 8 capsule 0    hydrOXYzine (VISTARIL) 50 MG capsule TAKE 1 CAPSULE(50 MG) BY MOUTH AT BEDTIME AS NEEDED 30 capsule 0   ibuprofen (ADVIL) 200 MG tablet Take 800 mg by mouth every 8 (eight) hours as needed (headache).     ibuprofen (ADVIL) 600 MG tablet Take 1 tablet (600 mg total) by mouth every 6 (six) hours as needed. 30 tablet 0   lidocaine-prilocaine (EMLA) cream Apply 1 application  topically once. Prior to procedure.     omeprazole (PRILOSEC) 20 MG capsule Take 1 capsule (20 mg total) by mouth daily. 30 capsule 3   tamsulosin (FLOMAX) 0.4 MG CAPS capsule TAKE 1 CAPSULE(0.4 MG) BY MOUTH DAILY 30 capsule 0   No current facility-administered medications for this visit.    Allergies: Allergies  Allergen Reactions   Penicillins Anaphylaxis    Did it involve swelling of the face/tongue/throat, SOB, or low BP? Yes Did it involve sudden or severe rash/hives, skin peeling, or any reaction on the inside of your mouth or nose? No Did you need to seek medical attention at a hospital or doctor's office? Yes When did it last happen?  Childhood allergy     If all above answers are "NO", may proceed with cephalosporin use.     Past Medical History:  Diagnosis Date   Asthma    Cancer (HCC)    stage 3 hodgkins lymphoma   Coronary atherosclerosis of native coronary artery    BMS to mid circumflex 11/2011, LVEF 55-60%   Essential hypertension    Migraine    Mixed hyperlipidemia    STEMI (ST elevation myocardial infarction) (HCC)  Complicated by VF arrest 11/2011   Past Surgical History:  Procedure Laterality Date   AXILLARY LYMPH NODE BIOPSY Left 07/26/2019   Procedure: AXILLARY LYMPH NODE BIOPSY;  Surgeon: Franky Macho, MD;  Location: [facility];  Service: General;  Laterality: Left;   BACK SURGERY     CARDIAC CATHETERIZATION     CHOLECYSTECTOMY N/A 08/26/2019   Procedure: LAPAROSCOPIC CHOLECYSTECTOMY;  Surgeon: Franky Macho, MD;  Location: [facility];  Service: General;  Laterality: N/A;   FISTULOTOMY N/A  11/14/2022   Procedure: FISTULOTOMY;  Surgeon: Franky Macho, MD;  Location: [facility];  Service: General;  Laterality: N/A;   INGUINAL LYMPH NODE BIOPSY Right 05/26/2021   Procedure: INGUINAL LYMPH NODE BIOPSY;  Surgeon: Franky Macho, MD;  Location: [facility];  Service: General;  Laterality: Right;   LEFT HEART CATHETERIZATION WITH CORONARY ANGIOGRAM N/A 11/21/2011   Procedure: LEFT HEART CATHETERIZATION WITH CORONARY ANGIOGRAM;  Surgeon: Peter M Swaziland, MD;  Location: [facility] CATH LAB;  Service: Cardiovascular;  Laterality: N/A;   LYMPH NODE BIOPSY Right 08/26/2019   Procedure: LYMPH NODE BIOPSY, INGUINAL;  Surgeon: Franky Macho, MD;  Location: [facility];  Service: General;  Laterality: Right;   PERCUTANEOUS CORONARY STENT INTERVENTION (PCI-S) N/A 11/21/2011   Procedure: PERCUTANEOUS CORONARY STENT INTERVENTION (PCI-S);  Surgeon: Peter M [place], MD;  Location: [facility] CATH LAB;  Service: Cardiovascular;  Laterality: N/A;   PORT-A-CATH REMOVAL N/A 12/27/2021   Procedure: MINOR REMOVAL PORT-A-CATH;  Surgeon: Franky Macho, MD;  Location: [facility];  Service: General;  Laterality: N/A;   PORTACATH PLACEMENT Left 10/01/2019   Procedure: INSERTION PORT-A-CATH (catheter attached left subclavian);  Surgeon: Franky Macho, MD;  Location: [facility];  Service: General;  Laterality: Left;   SPLENECTOMY     Family History  Problem Relation Age of Onset   Diabetes Mother    Malignant hyperthermia Mother    Cancer Mother        throat cancer   Heart disease Father    Cancer Father    Malignant hyperthermia Brother    Heart disease Brother    Asthma Brother    COPD Brother    ADD / ADHD Daughter    ADD / ADHD Son    Social History   Socioeconomic History   Marital status: Divorced    Spouse name: Not on file   Number of children: 2   Years of education: Not on file   Highest education level: Not on file  Occupational History   Occupation: not employed  Tobacco Use   Smoking status: Every Day    Current packs/day:  0.50    Average packs/day: 1 pack/day for 20.9 years (20.4 ttl pk-yrs)    Types: Cigarettes    Start date: 2024   Smokeless tobacco: Never  Vaping Use   Vaping status: Every Day   Start date: 04/27/2012  Substance and Sexual Activity   Alcohol use: Not Currently    Comment: Occasionally   Drug use: No   Sexual activity: Yes    Birth control/protection: Rhythm  Other Topics Concern   Not on file  Social History Narrative   Not on file   Social Determinants of Health   Financial Resource Strain: Low Risk  (09/23/2020)   Overall Financial Resource Strain (CARDIA)    Difficulty of Paying Living Expenses: Not hard at all  Food Insecurity: No Food Insecurity (09/23/2020)   Hunger Vital Sign    Worried About Running Out of Food in the Last Year: Never true  Ran Out of Food in the Last Year: Never true  Transportation Needs: No Transportation Needs (09/23/2020)   PRAPARE - Administrator, Civil Service (Medical): No    Lack of Transportation (Non-Medical): No  Physical Activity: Unknown (06/27/2019)   Exercise Vital Sign    Days of Exercise per Week: 3 days    Minutes of Exercise per Session: Not on file  Recent Concern: Physical Activity - Insufficiently Active (06/15/2019)   Exercise Vital Sign    Days of Exercise per Week: 2 days    Minutes of Exercise per Session: 30 min  Stress: No Stress Concern Present (09/23/2020)   [employer] of Occupational Health - Occupational Stress Questionnaire    Feeling of Stress : Not at all  Social Connections: Socially Isolated (09/23/2020)   Social Connection and Isolation Panel [NHANES]    Frequency of Communication with Friends and Family: More than three times a week    Frequency of Social Gatherings with Friends and Family: Three times a week    Attends Religious Services: Never    Active Member of Clubs or Organizations: No    Attends Banker Meetings: Never    Marital Status: Divorced  Catering manager  Violence: Not At Risk (09/23/2020)   Humiliation, Afraid, Rape, and Kick questionnaire    Fear of Current or Ex-Partner: No    Emotionally Abused: No    Physically Abused: No    Sexually Abused: No    SUBJECTIVE  Review of Systems*** Constitutional: Patient denies any unintentional weight loss or change in strength lntegumentary: Patient denies any rashes or pruritus Cardiovascular: Patient denies chest pain or syncope Respiratory: Patient denies shortness of breath Gastrointestinal: Patient ***denies nausea, vomiting, constipation, or diarrhea Musculoskeletal: Patient denies muscle cramps or weakness Neurologic: Patient denies convulsions or seizures Allergic/Immunologic: Patient denies recent allergic reaction(s) Hematologic/Lymphatic: Patient denies bleeding tendencies Endocrine: Patient denies heat/cold intolerance  GU: As per HPI.  OBJECTIVE There were no vitals filed for this visit. There is no height or weight on file to calculate BMI.  Physical Examination*** Constitutional: No obvious distress; patient is non-toxic appearing  Cardiovascular: No visible lower extremity edema.  Respiratory: The patient does not have audible wheezing/stridor; respirations do not appear labored  Gastrointestinal: Abdomen non-distended Musculoskeletal: Normal ROM of UEs  Skin: No obvious rashes/open sores  Neurologic: CN 2-12 grossly intact Psychiatric: Answered questions appropriately with normal affect  Hematologic/Lymphatic/Immunologic: No obvious bruises or sites of spontaneous bleeding  UA: ***negative *** WBC/hpf, *** RBC/hpf, bacteria (***) PVR: *** ml  ASSESSMENT No diagnosis found.  ***We reviewed recent imaging results; ***awaiting radiology results, appears to have ***no acute findings.  ***For stone prevention: Advised adequate hydration and we discussed option to consider low oxalate diet given that calcium oxalate is the most common type of stone. Handout provided about  stone prevention diet.  ***For recurrent stone formers: We discussed option to proceed with 24 hour urinalysis ([facility]) for metabolic evaluation, which may help with targeted recommendations for dietary I medication therapies for stone prevention. Patient elected to ***proceed/ ***hold off.  Will plan to follow up in ***6 months / ***1 year with ***KUB ***RUS for stone surveillance or sooner if needed.  Pt verbalized understanding and agreement. All questions were answered.  PLAN Advised the following: Maintain adequate fluid intake daily. Drink citrus juice (lemon, lime or orange juice) routinely. Low oxalate diet. No follow-ups on file.  No orders of the defined types were placed in this encounter.  It has been explained that the patient is to follow regularly with their PCP in addition to all other providers involved in their care and to follow instructions provided by these respective offices. Patient advised to contact urology clinic if any urologic-pertaining questions, concerns, new symptoms or problems arise in the interim period.  There are no Patient Instructions on file for this visit.  Electronically signed by:  Donnita Falls, MSN, FNP-C, CUNP 08/25/2023 10:27 AM

## 2023-08-25 NOTE — Telephone Encounter (Signed)
-----   Message from Donnita Falls sent at 08/25/2023 10:31 AM EST ----- Please remind patient to get KUB prior to visit on Tuesday 11/12. Thanks.

## 2023-08-25 NOTE — Telephone Encounter (Signed)
Left message on vm for patient to remember to get xray before appointment.

## 2023-08-28 ENCOUNTER — Ambulatory Visit (HOSPITAL_COMMUNITY)
Admission: RE | Admit: 2023-08-28 | Discharge: 2023-08-28 | Disposition: A | Discharge status: Home / Self Care | Payer: Medicaid Other | Source: Ambulatory Visit | Attending: Urology | Admitting: Urology

## 2023-08-28 DIAGNOSIS — N201 Calculus of ureter: Secondary | ICD-10-CM | POA: Diagnosis not present

## 2023-08-28 DIAGNOSIS — R109 Unspecified abdominal pain: Secondary | ICD-10-CM | POA: Diagnosis not present

## 2023-08-28 DIAGNOSIS — Z9081 Acquired absence of spleen: Secondary | ICD-10-CM | POA: Diagnosis not present

## 2023-08-28 DIAGNOSIS — Z9049 Acquired absence of other specified parts of digestive tract: Secondary | ICD-10-CM | POA: Diagnosis not present

## 2023-08-28 DIAGNOSIS — N3289 Other specified disorders of bladder: Secondary | ICD-10-CM | POA: Diagnosis not present

## 2023-08-29 ENCOUNTER — Ambulatory Visit: Payer: Medicaid Other | Admitting: Urology

## 2023-08-29 ENCOUNTER — Encounter: Payer: Self-pay | Admitting: Urology

## 2023-08-29 VITALS — BP 137/85 | HR 91 | Temp 97.8°F

## 2023-08-29 DIAGNOSIS — N200 Calculus of kidney: Secondary | ICD-10-CM

## 2023-08-29 DIAGNOSIS — R109 Unspecified abdominal pain: Secondary | ICD-10-CM

## 2023-08-29 DIAGNOSIS — N201 Calculus of ureter: Secondary | ICD-10-CM

## 2023-08-29 LAB — URINALYSIS, ROUTINE W REFLEX MICROSCOPIC
Bilirubin, UA: NEGATIVE
Glucose, UA: NEGATIVE
Ketones, UA: NEGATIVE
Leukocytes,UA: NEGATIVE
Nitrite, UA: NEGATIVE
Specific Gravity, UA: 1.025 (ref 1.005–1.030)
Urobilinogen, Ur: 1 mg/dL (ref 0.2–1.0)
pH, UA: 6 (ref 5.0–7.5)

## 2023-08-29 LAB — MICROSCOPIC EXAMINATION: Bacteria, UA: NONE SEEN

## 2023-08-29 MED ORDER — TAMSULOSIN HCL 0.4 MG PO CAPS: 0.4000 mg | ORAL_CAPSULE | Freq: Every day | ORAL | 0 refills | Status: AC

## 2023-08-29 MED ORDER — OXYCODONE-ACETAMINOPHEN 5-325 MG PO TABS: 1.0000 | ORAL_TABLET | Freq: Four times a day (QID) | ORAL | 0 refills | Status: AC | PRN

## 2023-08-30 ENCOUNTER — Other Ambulatory Visit: Payer: Self-pay | Admitting: Urology

## 2023-08-30 DIAGNOSIS — N200 Calculus of kidney: Secondary | ICD-10-CM

## 2023-09-05 NOTE — Progress Notes (Signed)
Name: YISROEL HAMACHER DOB: Dec 30, 1980 MRN: [id number]  History of Present Illness: Mr. Lello is a 42 y.o. male who presents today for follow up visit at [facility] [place]. - GU History: 1. Kidney stone x1.   Recent history:  > 06/22/2023: CT showed a 3 mm proximal left ureteral stone at the level of L2-3, with moderate obstructive uropathy. Also has a 2 mm nonobstructing stone in the inferior pole of the right kidney.   > 07/14/2023: KUB showed 3 mm mid left ureteral stone.   > 07/28/2023: KUB: "No radio-opaque calculi or other significant radiographic abnormality are seen."   > 08/03/2023: 4 mm mid left ureteral stone visualized on PET scan; no hydronephrosis.   > 08/13/2023: CT stone showed interval progression of 3 mm stone to distal left ureter.   > 08/28/2023: - CT stone showed "No significant interval change in positioning of the distal left ureteral stone measuring 3 mm which is located just upstream from the UVJ. No hydronephrosis.  At last visit on 08/29/2023: - We discussed finding of non-progression of his distal left ureter stone. Offered to either continue with medical expulsive therapy or proceed with stone intervention procedure such as ESWL or ureteroscopic stone manipulation.  - He elected to continue medical expulsive therapy with Flomax 0.4 mg daily with follow up in 2 weeks. If stone is still present at that point will plan to proceed with surgery.   Since last visit: - Yesterday (09/11/2023): KUB done; per radiology report: "Previously seen left lower ureteric calculus is not seen on this exam, favoring interval passage."  Today: He denies awareness of recent stone passage. He reports two days of intense left flank / LLQ abdominal pain over the past week which has since resolved. He denies fevers, nausea, or vomiting. Reports dysuria.  He denies increased urinary urgency, frequency, nocturia, gross hematuria, hesitancy, straining to void, or  sensations of incomplete emptying.   Fall Screening: Do you usually have a device to assist in your mobility? No   Medications: Current Outpatient Medications  Medication Sig Dispense Refill   albuterol (PROVENTIL HFA;VENTOLIN HFA) 108 (90 Base) MCG/ACT inhaler Inhale 2 puffs into the lungs every 6 (six) hours as needed for wheezing or shortness of breath.     aspirin EC 81 MG tablet Take 81 mg by mouth daily. Swallow whole.     atorvastatin (LIPITOR) 40 MG tablet Take 1 tablet (40 mg total) by mouth daily. 90 tablet 3   Cholecalciferol 1.25 MG (50000 UT) capsule Take 1 capsule (50,000 Units total) by mouth once a week. 8 capsule 0   hydrOXYzine (VISTARIL) 50 MG capsule TAKE 1 CAPSULE(50 MG) BY MOUTH AT BEDTIME AS NEEDED 30 capsule 0   ibuprofen (ADVIL) 200 MG tablet Take 800 mg by mouth every 8 (eight) hours as needed (headache).     ibuprofen (ADVIL) 600 MG tablet Take 1 tablet (600 mg total) by mouth every 6 (six) hours as needed. 30 tablet 0   lidocaine-prilocaine (EMLA) cream Apply 1 application  topically once. Prior to procedure.     omeprazole (PRILOSEC) 20 MG capsule Take 1 capsule (20 mg total) by mouth daily. 30 capsule 3   tamsulosin (FLOMAX) 0.4 MG CAPS capsule Take 1 capsule (0.4 mg total) by mouth daily. 30 capsule 0   No current facility-administered medications for this visit.    Allergies: Allergies  Allergen Reactions   Penicillins Anaphylaxis    Did it involve swelling of the face/tongue/throat, SOB, or  low BP? Yes Did it involve sudden or severe rash/hives, skin peeling, or any reaction on the inside of your mouth or nose? No Did you need to seek medical attention at a hospital or doctor's office? Yes When did it last happen?  Childhood allergy     If all above answers are "NO", may proceed with cephalosporin use.     Past Medical History:  Diagnosis Date   Asthma    Cancer (HCC)    stage 3 hodgkins lymphoma   Coronary atherosclerosis of native coronary  artery    BMS to mid circumflex 11/2011, LVEF 55-60%   Essential hypertension    Migraine    Mixed hyperlipidemia    STEMI (ST elevation myocardial infarction) (HCC)    Complicated by VF arrest 11/2011   Past Surgical History:  Procedure Laterality Date   AXILLARY LYMPH NODE BIOPSY Left 07/26/2019   Procedure: AXILLARY LYMPH NODE BIOPSY;  Surgeon: Franky Macho, MD;  Location: [facility];  Service: General;  Laterality: Left;   BACK SURGERY     CARDIAC CATHETERIZATION     CHOLECYSTECTOMY N/A 08/26/2019   Procedure: LAPAROSCOPIC CHOLECYSTECTOMY;  Surgeon: Franky Macho, MD;  Location: [facility];  Service: General;  Laterality: N/A;   FISTULOTOMY N/A 11/14/2022   Procedure: FISTULOTOMY;  Surgeon: Franky Macho, MD;  Location: [facility];  Service: General;  Laterality: N/A;   INGUINAL LYMPH NODE BIOPSY Right 05/26/2021   Procedure: INGUINAL LYMPH NODE BIOPSY;  Surgeon: Franky Macho, MD;  Location: [facility];  Service: General;  Laterality: Right;   LEFT HEART CATHETERIZATION WITH CORONARY ANGIOGRAM N/A 11/21/2011   Procedure: LEFT HEART CATHETERIZATION WITH CORONARY ANGIOGRAM;  Surgeon: Peter M Swaziland, MD;  Location: [facility] CATH LAB;  Service: Cardiovascular;  Laterality: N/A;   LYMPH NODE BIOPSY Right 08/26/2019   Procedure: LYMPH NODE BIOPSY, INGUINAL;  Surgeon: Franky Macho, MD;  Location: [facility];  Service: General;  Laterality: Right;   PERCUTANEOUS CORONARY STENT INTERVENTION (PCI-S) N/A 11/21/2011   Procedure: PERCUTANEOUS CORONARY STENT INTERVENTION (PCI-S);  Surgeon: Peter M Swaziland, MD;  Location: [facility] C[facility] LAB;  Service: Cardiovascular;  Laterality: N/A;   PORT-A-CATH REMOVAL N/A 12/27/2021   Procedure: MINOR REMOVAL PORT-A-CATH;  Surgeon: Franky Macho, MD;  Location: [facility];  Service: General;  Laterality: N/A;   PORTACATH PLACEMENT Left 10/01/2019   Procedure: INSERTION PORT-A-CATH (catheter attached left subclavian);  Surgeon: Franky Macho, MD;  Location: [facility];  Service: General;  Laterality: Left;    SPLENECTOMY     Family History  Problem Relation Age of Onset   Diabetes Mother    Malignant hyperthermia Mother    Cancer Mother        throat cancer   Heart disease Father    Cancer Father    Malignant hyperthermia Brother    Heart disease Brother    Asthma Brother    COPD Brother    ADD / ADHD Daughter    ADD / ADHD Son    Social History   Socioeconomic History   Marital status: Divorced    Spouse name: Not on file   Number of children: 2   Years of education: Not on file   Highest education level: Not on file  Occupational History   Occupation: not employed  Tobacco Use   Smoking status: Every Day    Current packs/day: 0.50    Average packs/day: 1 pack/day for 20.9 years (20.5 ttl pk-yrs)    Types: Cigarettes    Start date: 2024  Smokeless tobacco: Never  Vaping Use   Vaping status: Every Day   Start date: 04/27/2012  Substance and Sexual Activity   Alcohol use: Not Currently    Comment: Occasionally   Drug use: No   Sexual activity: Yes    Birth control/protection: Rhythm  Other Topics Concern   Not on file  Social History Narrative   Not on file   Social Determinants of Health   Financial Resource Strain: Low Risk  (09/23/2020)   Overall Financial Resource Strain (CARDIA)    Difficulty of Paying Living Expenses: Not hard at all  Food Insecurity: No Food Insecurity (09/23/2020)   Hunger Vital Sign    Worried About Running Out of Food in the Last Year: Never true    Ran Out of Food in the Last Year: Never true  Transportation Needs: No Transportation Needs (09/23/2020)   PRAPARE - Administrator, Civil Service (Medical): No    Lack of Transportation (Non-Medical): No  Physical Activity: Unknown (06/27/2019)   Exercise Vital Sign    Days of Exercise per Week: 3 days    Minutes of Exercise per Session: Not on file  Recent Concern: Physical Activity - Insufficiently Active (06/15/2019)   Exercise Vital Sign    Days of Exercise per Week: 2 days     Minutes of Exercise per Session: 30 min  Stress: No Stress Concern Present (09/23/2020)   [employer] of Occupational Health - Occupational Stress Questionnaire    Feeling of Stress : Not at all  Social Connections: Socially Isolated (09/23/2020)   Social Connection and Isolation Panel [NHANES]    Frequency of Communication with Friends and Family: More than three times a week    Frequency of Social Gatherings with Friends and Family: Three times a week    Attends Religious Services: Never    Active Member of Clubs or Organizations: No    Attends Banker Meetings: Never    Marital Status: Divorced  Catering manager Violence: Not At Risk (09/23/2020)   Humiliation, Afraid, Rape, and Kick questionnaire    Fear of Current or Ex-Partner: No    Emotionally Abused: No    Physically Abused: No    Sexually Abused: No    SUBJECTIVE  Review of Systems Constitutional: Patient denies any unintentional weight loss or change in strength lntegumentary: Patient denies any rashes or pruritus Cardiovascular: Patient denies chest pain or syncope Respiratory: Patient denies shortness of breath Gastrointestinal: Patient denies nausea, vomiting, constipation, or diarrhea Musculoskeletal: Patient denies muscle cramps or weakness Neurologic: Patient denies convulsions or seizures Allergic/Immunologic: Patient denies recent allergic reaction(s) Hematologic/Lymphatic: Patient denies bleeding tendencies Endocrine: Patient denies heat/cold intolerance  GU: As per HPI.  OBJECTIVE Vitals:   09/12/23 0843  BP: 132/84  Pulse: 97  Temp: 97.7 F (36.5 C)   There is no height or weight on file to calculate BMI.  Physical Examination Constitutional: No obvious distress; patient is non-toxic appearing  Cardiovascular: No visible lower extremity edema.  Respiratory: The patient does not have audible wheezing/stridor; respirations do not appear labored  Gastrointestinal: Abdomen  non-distended Musculoskeletal: Normal ROM of UEs  Skin: No obvious rashes/open sores  Neurologic: CN 2-12 grossly intact Psychiatric: Answered questions appropriately with normal affect  Hematologic/Lymphatic/Immunologic: No obvious bruises or sites of spontaneous bleeding  UA: >30 RBC/hpf with no evidence of UTI    ASSESSMENT Left ureteral stone - Plan: Urinalysis, Routine w reflex microscopic  Kidney stones - Plan: DG Abd 1 View  We reviewed recent imaging results; his left ureteral stone appears to have finally passed. He was advised that his dysuria and microscopic hematuria are likely due to urethral irritation from stone passage and are anticipated to improve over the next week or so.  For stone prevention: Advised adequate hydration and we discussed option to consider low oxalate diet given that calcium oxalate is the most common type of stone. Handout provided about stone prevention diet.  Will plan to follow up in 6 months with KUB for stone surveillance or sooner if needed. Pt verbalized understanding and agreement. All questions were answered.  PLAN Advised the following: Maintain adequate fluid intake daily. Drink citrus juice (lemon, lime or orange juice) routinely. Low oxalate diet. Return in about 6 months (around 03/11/2024) for KUB, UA, & f/u with Evette Georges NP.  Orders Placed This Encounter  Procedures   DG Abd 1 View    Standing Status:   Future    Standing Expiration Date:   09/11/2024    Order Specific Question:   Reason for Exam (SYMPTOM  OR DIAGNOSIS REQUIRED)    Answer:   kidney stone    Order Specific Question:   Preferred imaging location?    Answer:   [facility]   Urinalysis, Routine w reflex microscopic    It has been explained that the patient is to follow regularly with their PCP in addition to all other providers involved in their care and to follow instructions provided by these respective offices. Patient advised to contact urology  clinic if any urologic-pertaining questions, concerns, new symptoms or problems arise in the interim period.  Patient Instructions  >80% of stones are calcium oxalate. This type of stones forms when body either isn't clearing oxalate well enough, is making too much oxalate, or too little citrate. This results in oxalate binding to form crystals, which continue to aggregate and form stones.  Limiting calcium does not help, but limiting oxalate in the diet can help. Increasing citric acid intake may also help.  The following measures may help to prevent the recurrence of stones: Increase water intake to 2-2.5 liters per day May add citrus juice (lemon, lime or orange juice) to water Moderation in dairy foods Decrease in salt content 5. Low Oxalate diet: Oxylates are found in foods like Tomato, Spinach, red wine and chocolate (see additional resources below).  Internet resources for information regarding low oxalate diet: https://[email or web address].yangchunwu.com https://my.VerticalStretch.be  Foods Low in Sodium or Oxalate Foods You Can Eat  Drinks Coffee, fruit and veggie juice (using the recommended veggies), fruit punch  Fruits Apples, apricots (fresh or canned), avocado, bananas, cherries (sweet), cranberries, grapefruit, red or green grapes, lemon and lime juice, melons, nectarines, papayas, peaches, pears, pineapples, oranges, strawberries (fresh), tangerines  Veggies Artichokes, asparagus, bamboo shoots, broccoli, brussels sprouts, cabbage, cauliflower, chayote squash, chicory, corn, cucumbers, endive, lettuce, lima beans, mushrooms, onions, peas, peppers, potatoes, radishes, rutabagas, zucchini  Breads, Cereals, Grains Egg noodles, rye bread, cooked and dry cereals without nuts or bran, crackers with unsalted tops, white or wild rice  Meat, Meat Replacements, Fish, Recruitment consultant, fish, poultry, eggs,  egg whites, egg replacements  Soup Homemade soup (using the recommended veggies and meat), low-sodium bouillon, low-sodium canned  Desserts Cookies, cakes, ice cream, pudding without chocolate or nuts, candy without chocolate or nuts  Fats and Oils Butter, margarine, cream, oil, salad dressing, mayo  Other Foods Unsalted potato chips or pretzels, herbs (like garlic, garlic powder, onion powder), lemon juice, salt-free seasoning blends,  vinegar  Other Foods Low in Oxalate Foods You Can Eat  Drinks Beer, cola, wine, buttermilk, lemonade or limeade (without added vitamin C), milk  Meat, Meat Replacements, Fish, [employer] meat, ham, bacon, hot dogs, bratwurst, sausage, chicken nuggets, cheddar cheese, canned fish and shellfish  Soup Tomato soup, cheese soup  Other Foods Coconuts, lemon or lime juices, sugar or sweeteners, jellies or jams (from the recommended list)   Moderate-Oxalate Foods Foods to Limit   Drinks Fruit and veggie juices (from the list below), chocolate milk, rice milk, hot cocoa, tea   Fruits Blackberries, blueberries, black currants, cherries (sour), fruit cocktail, mangoes, orange peel, prunes, purple plums   Veggies Baked beans, carrots, celery, green beans, parsnips, summer squash, tomatoes, turnips   Breads, Cereals, Grains White bread, cornbread or cornmeal, white English muffins, saltine or soda crackers, brown rice, vanilla wafers, spaghetti and other noodles, firm tofu, bagels, oatmeal   Meat/meat replacements, fish, poultry Sardines   Desserts Chocolate cake   Fats and Oils Macadamia nuts, pistachio nuts, English walnuts   Other Foods Jams or jellies (made with the fruits above), pepper    High-Oxalate Foods Foods to Avoid Drinks Chocolate drink mixes, soy milk, Ovaltine, instant iced tea, fruit juices of fruits listed below Fruits Apricots (dried), red currants, figs, kiwi, plums, rhubarb Veggies Beans (wax, dried), beets and beet greens, chives, collard  greens, eggplant, escarole, dark greens of all kinds, leeks, okra, parsley, rutabagas, spinach, Swiss chard, tomato paste, watercress Breads, Cereals, Grains Amaranth, barley, white corn flour, fried potatoes, fruitcake, grits, soybean products, sweet potatoes, wheat germ and bran, buckwheat flour, All Bran cereal, graham crackers, pretzels, whole wheat bread Meat/meat replacements, fish, poultry  Dried beans, peanut butter, soy burgers, miso  Desserts Carob, chocolate, marmalades Fats and Oils Nuts (peanuts, almonds, pecans, cashews, hazelnuts), nut butters, sesame seeds, tahini paste Other Foods Poppy seeds   Electronically signed by:  Donnita Falls, MSN, FNP-C, CUNP 09/12/2023 9:31 AM

## 2023-09-11 ENCOUNTER — Ambulatory Visit (HOSPITAL_COMMUNITY)
Admission: RE | Admit: 2023-09-11 | Discharge: 2023-09-11 | Disposition: A | Discharge status: Home / Self Care | Payer: Medicaid Other | Source: Ambulatory Visit | Attending: Urology | Admitting: Urology

## 2023-09-11 DIAGNOSIS — N200 Calculus of kidney: Secondary | ICD-10-CM | POA: Diagnosis not present

## 2023-09-12 ENCOUNTER — Encounter: Payer: Self-pay | Admitting: Urology

## 2023-09-12 ENCOUNTER — Ambulatory Visit: Payer: Medicaid Other | Admitting: Urology

## 2023-09-12 VITALS — BP 132/84 | HR 97 | Temp 97.7°F

## 2023-09-12 DIAGNOSIS — Z87442 Personal history of urinary calculi: Secondary | ICD-10-CM | POA: Diagnosis not present

## 2023-09-12 DIAGNOSIS — N201 Calculus of ureter: Secondary | ICD-10-CM

## 2023-09-12 DIAGNOSIS — Z09 Encounter for follow-up examination after completed treatment for conditions other than malignant neoplasm: Secondary | ICD-10-CM | POA: Diagnosis not present

## 2023-09-12 DIAGNOSIS — N200 Calculus of kidney: Secondary | ICD-10-CM

## 2023-09-12 LAB — URINALYSIS, ROUTINE W REFLEX MICROSCOPIC
Bilirubin, UA: NEGATIVE
Glucose, UA: NEGATIVE
Ketones, UA: NEGATIVE
Leukocytes,UA: NEGATIVE
Nitrite, UA: NEGATIVE
Protein,UA: NEGATIVE
Specific Gravity, UA: 1.03 (ref 1.005–1.030)
Urobilinogen, Ur: 1 mg/dL (ref 0.2–1.0)
pH, UA: 6 (ref 5.0–7.5)

## 2023-09-12 LAB — MICROSCOPIC EXAMINATION
Bacteria, UA: NONE SEEN
RBC, Urine: >30 /[HPF] — AB (ref 0–2)

## 2023-09-12 NOTE — Patient Instructions (Signed)

## 2023-09-13 ENCOUNTER — Other Ambulatory Visit: Payer: Self-pay | Admitting: Urology

## 2023-09-13 DIAGNOSIS — R109 Unspecified abdominal pain: Secondary | ICD-10-CM

## 2023-09-13 DIAGNOSIS — N201 Calculus of ureter: Secondary | ICD-10-CM

## 2023-09-13 DIAGNOSIS — N200 Calculus of kidney: Secondary | ICD-10-CM

## 2023-09-25 ENCOUNTER — Other Ambulatory Visit: Payer: Self-pay | Admitting: Internal Medicine

## 2023-09-25 DIAGNOSIS — F5101 Primary insomnia: Secondary | ICD-10-CM

## 2023-10-24 ENCOUNTER — Other Ambulatory Visit: Payer: Self-pay | Admitting: Internal Medicine

## 2023-10-24 DIAGNOSIS — K219 Gastro-esophageal reflux disease without esophagitis: Secondary | ICD-10-CM

## 2023-10-24 DIAGNOSIS — F5101 Primary insomnia: Secondary | ICD-10-CM

## 2023-11-10 ENCOUNTER — Ambulatory Visit: Payer: Medicaid Other | Admitting: Internal Medicine

## 2024-02-05 ENCOUNTER — Inpatient Hospital Stay: Payer: Medicaid Other

## 2024-02-05 ENCOUNTER — Ambulatory Visit (HOSPITAL_COMMUNITY): Payer: Medicaid Other | Attending: Hematology

## 2024-02-12 ENCOUNTER — Inpatient Hospital Stay: Payer: Medicaid Other | Admitting: Hematology

## 2024-03-12 ENCOUNTER — Ambulatory Visit: Payer: Medicaid Other | Admitting: Urology

## 2024-04-15 NOTE — Progress Notes (Deleted)
 Name: Nathan Waters DOB: May 14, 1981 MRN: [id number]  History of Present Illness: Nathan Waters is a 43 y.o. male who presents today for follow up visit at [facility]. Relevant History includes: 1. Kidney stones.  At last visit on 09/12/2023: After 2-3 months of medical expulsive therapy his left ureteral stone finally passed.  Today: KUB today: Awaiting radiology read; *** appreciated per provider interpretation.  He {Actions; denies-reports:120008} recent suspected stone migration / passage. He {Actions; denies-reports:120008} flank pain or abdominal pain. He {Actions; denies-reports:120008} fevers, nausea, or vomiting.  He {Actions; denies-reports:120008} increased urinary urgency, frequency, nocturia, dysuria, gross hematuria, hesitancy, straining to void, or sensations of incomplete emptying.   Medications: Current Outpatient Medications  Medication Sig Dispense Refill   albuterol  (PROVENTIL  HFA;VENTOLIN  HFA) 108 (90 Base) MCG/ACT inhaler Inhale 2 puffs into the lungs every 6 (six) hours as needed for wheezing or shortness of breath.     aspirin  EC 81 MG tablet Take 81 mg by mouth daily. Swallow whole.     atorvastatin  (LIPITOR) 40 MG tablet Take 1 tablet (40 mg total) by mouth daily. 90 tablet 3   Cholecalciferol  1.25 MG (50000 UT) capsule Take 1 capsule (50,000 Units total) by mouth once a week. 8 capsule 0   hydrOXYzine  (VISTARIL ) 50 MG capsule TAKE 1 CAPSULE(50 MG) BY MOUTH AT BEDTIME AS NEEDED 30 capsule 0   ibuprofen  (ADVIL ) 200 MG tablet Take 800 mg by mouth every 8 (eight) hours as needed (headache).     ibuprofen  (ADVIL ) 600 MG tablet Take 1 tablet (600 mg total) by mouth every 6 (six) hours as needed. 30 tablet 0   lidocaine -prilocaine  (EMLA ) cream Apply 1 application  topically once. Prior to procedure.     omeprazole  (PRILOSEC) 20 MG capsule TAKE 1 CAPSULE(20 MG) BY MOUTH DAILY 30 capsule 3   tamsulosin  (FLOMAX ) 0.4 MG CAPS capsule Take 1 capsule  (0.4 mg total) by mouth daily. 30 capsule 0   No current facility-administered medications for this visit.    Allergies: Allergies  Allergen Reactions   Penicillins Anaphylaxis    Did it involve swelling of the face/tongue/throat, SOB, or low BP? Yes Did it involve sudden or severe rash/hives, skin peeling, or any reaction on the inside of your mouth or nose? No Did you need to seek medical attention at a hospital or doctor's office? Yes When did it last happen?  Childhood allergy     If all above answers are "NO", may proceed with cephalosporin use.     Past Medical History:  Diagnosis Date   Asthma    Cancer (HCC)    stage 3 hodgkins lymphoma   Coronary atherosclerosis of native coronary artery    BMS to mid circumflex 11/2011, LVEF 55-60%   Essential hypertension    Migraine    Mixed hyperlipidemia    STEMI (ST elevation myocardial infarction) (HCC)    Complicated by VF arrest 11/2011   Past Surgical History:  Procedure Laterality Date   AXILLARY LYMPH NODE BIOPSY Left 07/26/2019   Procedure: AXILLARY LYMPH NODE BIOPSY;  Surgeon: Mavis Anes, MD;  Location: [facility];  Service: General;  Laterality: Left;   BACK SURGERY     CARDIAC CATHETERIZATION     CHOLECYSTECTOMY N/A 08/26/2019   Procedure: LAPAROSCOPIC CHOLECYSTECTOMY;  Surgeon: Mavis Anes, MD;  Location: [facility];  Service: General;  Laterality: N/A;   FISTULOTOMY N/A 11/14/2022   Procedure: FISTULOTOMY;  Surgeon: Mavis Anes, MD;  Location: [facility];  Service: General;  Laterality: N/A;   INGUINAL LYMPH NODE BIOPSY Right 05/26/2021   Procedure: INGUINAL LYMPH NODE BIOPSY;  Surgeon: Mavis Anes, MD;  Location: [facility];  Service: General;  Laterality: Right;   LEFT HEART CATHETERIZATION WITH CORONARY ANGIOGRAM N/A 11/21/2011   Procedure: LEFT HEART CATHETERIZATION WITH CORONARY ANGIOGRAM;  Surgeon: Peter M Swaziland, MD;  Location: [facility] CATH LAB;  Service: Cardiovascular;  Laterality: N/A;   LYMPH NODE BIOPSY Right 08/26/2019    Procedure: LYMPH NODE BIOPSY, INGUINAL;  Surgeon: Mavis Anes, MD;  Location: [facility];  Service: General;  Laterality: Right;   PERCUTANEOUS CORONARY STENT INTERVENTION (PCI-S) N/A 11/21/2011   Procedure: PERCUTANEOUS CORONARY STENT INTERVENTION (PCI-S);  Surgeon: Peter M Swaziland, MD;  Location: [facility] C[facility] LAB;  Service: Cardiovascular;  Laterality: N/A;   PORT-A-CATH REMOVAL N/A 12/27/2021   Procedure: MINOR REMOVAL PORT-A-CATH;  Surgeon: Mavis Anes, MD;  Location: [facility];  Service: General;  Laterality: N/A;   PORTACATH PLACEMENT Left 10/01/2019   Procedure: INSERTION PORT-A-CATH (catheter attached left subclavian);  Surgeon: Mavis Anes, MD;  Location: [facility];  Service: General;  Laterality: Left;   SPLENECTOMY     Family History  Problem Relation Age of Onset   Diabetes Mother    Malignant hyperthermia Mother    Cancer Mother        throat cancer   Heart disease Father    Cancer Father    Malignant hyperthermia Brother    Heart disease Brother    Asthma Brother    COPD Brother    ADD / ADHD Daughter    ADD / ADHD Son    Social History   Socioeconomic History   Marital status: Divorced    Spouse name: Not on file   Number of children: 2   Years of education: Not on file   Highest education level: Not on file  Occupational History   Occupation: not employed  Tobacco Use   Smoking status: Every Day    Current packs/day: 0.50    Average packs/day: 1 pack/day for 21.5 years (20.7 ttl pk-yrs)    Types: Cigarettes    Start date: 2024   Smokeless tobacco: Never  Vaping Use   Vaping status: Every Day   Start date: 04/27/2012  Substance and Sexual Activity   Alcohol use: Not Currently    Comment: Occasionally   Drug use: No   Sexual activity: Yes    Birth control/protection: Rhythm  Other Topics Concern   Not on file  Social History Narrative   Not on file   Social Drivers of Health   Financial Resource Strain: Low Risk  (09/23/2020)   Overall Financial Resource  Strain (CARDIA)    Difficulty of Paying Living Expenses: Not hard at all  Food Insecurity: No Food Insecurity (09/23/2020)   Hunger Vital Sign    Worried About Running Out of Food in the Last Year: Never true    Ran Out of Food in the Last Year: Never true  Transportation Needs: No Transportation Needs (09/23/2020)   PRAPARE - Administrator, Civil Service (Medical): No    Lack of Transportation (Non-Medical): No  Physical Activity: Unknown (06/27/2019)   Exercise Vital Sign    Days of Exercise per Week: 3 days    Minutes of Exercise per Session: Not on file  Recent Concern: Physical Activity - Insufficiently Active (06/15/2019)   Exercise Vital Sign    Days of Exercise per Week: 2 days    Minutes of Exercise per Session:  30 min  Stress: No Stress Concern Present (09/23/2020)   [employer] of Occupational Health - Occupational Stress Questionnaire    Feeling of Stress : Not at all  Social Connections: Socially Isolated (09/23/2020)   Social Connection and Isolation Panel    Frequency of Communication with Friends and Family: More than three times a week    Frequency of Social Gatherings with Friends and Family: Three times a week    Attends Religious Services: Never    Active Member of Clubs or Organizations: No    Attends Banker Meetings: Never    Marital Status: Divorced  Catering manager Violence: Not At Risk (09/23/2020)   Humiliation, Afraid, Rape, and Kick questionnaire    Fear of Current or Ex-Partner: No    Emotionally Abused: No    Physically Abused: No    Sexually Abused: No    SUBJECTIVE  Review of Systems Constitutional: Patient denies any unintentional weight loss or change in strength lntegumentary: Patient denies any rashes or pruritus Cardiovascular: Patient denies chest pain or syncope Respiratory: Patient denies shortness of breath Gastrointestinal: ***Patient denies nausea, vomiting, constipation, or diarrhea ***As per  HPI Musculoskeletal: Patient denies muscle cramps or weakness Neurologic: Patient denies convulsions or seizures Allergic/Immunologic: Patient denies recent allergic reaction(s) Hematologic/Lymphatic: Patient denies bleeding tendencies Endocrine: Patient denies heat/cold intolerance  GU: As per HPI.  OBJECTIVE There were no vitals filed for this visit. There is no height or weight on file to calculate BMI.  Physical Examination Constitutional: No obvious distress; patient is non-toxic appearing  Cardiovascular: No visible lower extremity edema.  Respiratory: The patient does not have audible wheezing/stridor; respirations do not appear labored  Gastrointestinal: Abdomen non-distended Musculoskeletal: Normal ROM of UEs  Skin: No obvious rashes/open sores  Neurologic: CN 2-12 grossly intact Psychiatric: Answered questions appropriately with normal affect  Hematologic/Lymphatic/Immunologic: No obvious bruises or sites of spontaneous bleeding  UA: ***negative ***positive for *** leukocytes, *** blood, ***nitrites Urine microscopy: *** WBC/hpf, *** RBC/hpf, *** bacteria ***glucosuria (secondary to ***Jardiance ***Farxiga use) ***otherwise unremarkable  PVR: *** ml  ASSESSMENT No diagnosis found.  ***We reviewed recent imaging results; ***awaiting radiology results, appears to have ***no acute findings per provider interpretation.  ***For stone prevention: Advised adequate hydration and we discussed option to consider low oxalate diet given that calcium  oxalate is the most common type of stone. Handout provided about stone prevention diet.  ***For recurrent stone formers: We discussed option to proceed with 24 hour urinalysis ([facility]) for metabolic stone evaluation, which may help with targeted recommendations for dietary I medication therapies for stone prevention. Patient elected to ***proceed/ ***hold off.  Will plan to follow up in ***6 months / ***1 year with ***KUB ***RUS  for stone surveillance or sooner if needed.  Patient verbalized understanding of and agreement with current plan. All questions were answered.  PLAN Advised the following: Maintain adequate fluid intake daily. Drink citrus juice (lemon, lime or orange juice) routinely. Low oxalate diet. No follow-ups on file.  No orders of the defined types were placed in this encounter.   It has been explained that the patient is to follow regularly with their PCP in addition to all other providers involved in their care and to follow instructions provided by these respective offices. Patient advised to contact urology clinic if any urologic-pertaining questions, concerns, new symptoms or problems arise in the interim period.  There are no Patient Instructions on file for this visit.  Electronically signed by:  Lauraine KYM Oz, MSN,  FNP-C, CUNP 04/15/2024 2:40 PM

## 2024-04-16 ENCOUNTER — Ambulatory Visit: Admitting: Urology

## 2024-10-06 ENCOUNTER — Encounter (HOSPITAL_COMMUNITY): Payer: Self-pay | Admitting: *Deleted

## 2024-10-06 ENCOUNTER — Other Ambulatory Visit: Payer: Self-pay

## 2024-10-06 ENCOUNTER — Emergency Department (HOSPITAL_COMMUNITY)
Admission: EM | Admit: 2024-10-06 | Discharge: 2024-10-06 | Disposition: A | Discharge status: Home / Self Care | Attending: Emergency Medicine | Admitting: Emergency Medicine

## 2024-10-06 ENCOUNTER — Emergency Department (HOSPITAL_COMMUNITY)

## 2024-10-06 DIAGNOSIS — I10 Essential (primary) hypertension: Secondary | ICD-10-CM | POA: Insufficient documentation

## 2024-10-06 DIAGNOSIS — Z7982 Long term (current) use of aspirin: Secondary | ICD-10-CM | POA: Diagnosis not present

## 2024-10-06 DIAGNOSIS — N200 Calculus of kidney: Secondary | ICD-10-CM | POA: Insufficient documentation

## 2024-10-06 DIAGNOSIS — J45909 Unspecified asthma, uncomplicated: Secondary | ICD-10-CM | POA: Diagnosis not present

## 2024-10-06 DIAGNOSIS — I2510 Atherosclerotic heart disease of native coronary artery without angina pectoris: Secondary | ICD-10-CM | POA: Diagnosis not present

## 2024-10-06 DIAGNOSIS — R10A1 Flank pain, right side: Secondary | ICD-10-CM | POA: Diagnosis present

## 2024-10-06 DIAGNOSIS — N201 Calculus of ureter: Secondary | ICD-10-CM | POA: Diagnosis not present

## 2024-10-06 LAB — CBC WITH DIFFERENTIAL/PLATELET
Abs Immature Granulocytes: 0.02 K/uL (ref 0.00–0.07)
Basophils Absolute: 0.1 K/uL (ref 0.0–0.1)
Basophils Relative: 0 %
Eosinophils Absolute: 0.4 K/uL (ref 0.0–0.5)
Eosinophils Relative: 3 %
HCT: 44.3 % (ref 39.0–52.0)
Hemoglobin: 15.1 g/dL (ref 13.0–17.0)
Immature Granulocytes: 0 %
Lymphocytes Relative: 24 %
Lymphs Abs: 3.2 K/uL (ref 0.7–4.0)
MCH: 32.9 pg (ref 26.0–34.0)
MCHC: 34.1 g/dL (ref 30.0–36.0)
MCV: 96.5 fL (ref 80.0–100.0)
Monocytes Absolute: 1 K/uL (ref 0.1–1.0)
Monocytes Relative: 7 %
Neutro Abs: 9 K/uL — ABNORMAL HIGH (ref 1.7–7.7)
Neutrophils Relative %: 66 %
Platelets: 305 K/uL (ref 150–400)
RBC: 4.59 MIL/uL (ref 4.22–5.81)
RDW: 14.8 % (ref 11.5–15.5)
WBC: 13.6 K/uL — ABNORMAL HIGH (ref 4.0–10.5)
nRBC: 0 % (ref 0.0–0.2)

## 2024-10-06 LAB — COMPREHENSIVE METABOLIC PANEL WITH GFR
ALT: 22 U/L (ref 0–44)
AST: 21 U/L (ref 15–41)
Albumin: 4.1 g/dL (ref 3.5–5.0)
Alkaline Phosphatase: 112 U/L (ref 38–126)
Anion gap: 13 (ref 5–15)
BUN: 8 mg/dL (ref 6–20)
CO2: 19 mmol/L — ABNORMAL LOW (ref 22–32)
Calcium: 8.9 mg/dL (ref 8.9–10.3)
Chloride: 109 mmol/L (ref 98–111)
Creatinine, Ser: 0.83 mg/dL (ref 0.61–1.24)
GFR, Estimated: >60 mL/min
Glucose, Bld: 131 mg/dL — ABNORMAL HIGH (ref 70–99)
Potassium: 3.8 mmol/L (ref 3.5–5.1)
Sodium: 141 mmol/L (ref 135–145)
Total Bilirubin: 0.4 mg/dL (ref 0.0–1.2)
Total Protein: 7.1 g/dL (ref 6.5–8.1)

## 2024-10-06 LAB — URINALYSIS, ROUTINE W REFLEX MICROSCOPIC
Bilirubin Urine: NEGATIVE
Glucose, UA: NEGATIVE mg/dL
Ketones, ur: NEGATIVE mg/dL
Leukocytes,Ua: NEGATIVE
Nitrite: NEGATIVE
Protein, ur: 30 mg/dL — AB
RBC / HPF: >50 RBC/hpf (ref 0–5)
Specific Gravity, Urine: 1.015 (ref 1.005–1.030)
pH: 5 (ref 5.0–8.0)

## 2024-10-06 LAB — LIPASE, BLOOD: Lipase: 18 U/L (ref 11–51)

## 2024-10-06 LAB — MAGNESIUM: Magnesium: 2.1 mg/dL (ref 1.7–2.4)

## 2024-10-06 MED ORDER — ONDANSETRON 4 MG PO TBDP: 4.0000 mg | ORAL_TABLET | Freq: Three times a day (TID) | ORAL | 0 refills | Status: AC | PRN

## 2024-10-06 MED ORDER — KETOROLAC TROMETHAMINE 15 MG/ML IJ SOLN
15.0000 mg | Freq: Once | INTRAMUSCULAR | Status: AC
  Administered 2024-10-06: 15 mg via INTRAVENOUS
  Filled 2024-10-06: qty 1

## 2024-10-06 MED ORDER — TAMSULOSIN HCL 0.4 MG PO CAPS
0.4000 mg | ORAL_CAPSULE | Freq: Once | ORAL | Status: AC
  Administered 2024-10-06: 0.4 mg via ORAL
  Filled 2024-10-06: qty 1

## 2024-10-06 MED ORDER — TAMSULOSIN HCL 0.4 MG PO CAPS: 0.4000 mg | ORAL_CAPSULE | Freq: Every day | ORAL | 0 refills | Status: AC

## 2024-10-06 MED ORDER — ONDANSETRON HCL 4 MG/2ML IJ SOLN
4.0000 mg | Freq: Once | INTRAMUSCULAR | Status: AC
  Administered 2024-10-06: 4 mg via INTRAVENOUS
  Filled 2024-10-06: qty 2

## 2024-10-06 MED ORDER — LACTATED RINGERS IV BOLUS
1000.0000 mL | Freq: Once | INTRAVENOUS | Status: AC
  Administered 2024-10-06: 1000 mL via INTRAVENOUS

## 2024-10-06 MED ORDER — HYDROMORPHONE HCL 1 MG/ML IJ SOLN
1.0000 mg | Freq: Once | INTRAMUSCULAR | Status: AC
  Administered 2024-10-06: 1 mg via INTRAVENOUS
  Filled 2024-10-06: qty 1

## 2024-10-06 MED ORDER — OXYCODONE-ACETAMINOPHEN 5-325 MG PO TABS: 1.0000 | ORAL_TABLET | Freq: Four times a day (QID) | ORAL | 0 refills | Status: AC | PRN

## 2024-10-06 NOTE — ED Triage Notes (Signed)
 Pt c/o right flank pain that radiates to right groin that started earlier tonight  Pt denies any urinary sx but states his urine has a strong odor  Pt has hx of kidney stones  Pt vomiting during triage

## 2024-10-06 NOTE — ED Provider Notes (Signed)
 " [place] [facility] AT [facility] Provider Note   CSN: [id number] Arrival date & time: 10/06/24  0209     Patient presents with: Flank Pain   Nathan Waters is a 43 y.o. male.   HPI Patient presents for right flank pain.  Medical history includes HTN, HLD, migraines, CAD, asthma, nephrolithiasis, lymphoma.  Lymphoma was identified and treated in 2021.  He follows with Dr. MARLA for every 60-month surveillance.  Earlier today, he had onset of a mild pain in area of right flank.  This was primarily in his back area.  This evening, severity of pain worsens.  It has migrated to right lower quadrant and right groin.  He had onset of nausea and vomiting shortly prior to arrival.  He has not taken anything for analgesia thus far tonight.  Symptoms are reminiscent of prior nephrolithiasis.    Prior to Admission medications  Medication Sig Start Date End Date Taking? Authorizing Provider  ondansetron  (ZOFRAN -ODT) 4 MG disintegrating tablet Take 1 tablet (4 mg total) by mouth every 8 (eight) hours as needed. 10/06/24  Yes Melvenia Motto, MD  oxyCODONE -acetaminophen  (PERCOCET/ROXICET) 5-325 MG tablet Take 1 tablet by mouth every 6 (six) hours as needed for up to 3 days for severe pain (pain score 7-10). 10/06/24 10/09/24 Yes Melvenia Motto, MD  tamsulosin  (FLOMAX ) 0.4 MG CAPS capsule Take 1 capsule (0.4 mg total) by mouth daily. 10/06/24  Yes Melvenia Motto, MD  albuterol  (PROVENTIL  HFA;VENTOLIN  HFA) 108 (90 Base) MCG/ACT inhaler Inhale 2 puffs into the lungs every 6 (six) hours as needed for wheezing or shortness of breath.    [provider]  aspirin  EC 81 MG tablet Take 81 mg by mouth daily. Swallow whole.    [provider]  atorvastatin  (LIPITOR) 40 MG tablet Take 1 tablet (40 mg total) by mouth daily. 05/09/23   Melvenia Manus BRAVO, MD  Cholecalciferol  1.25 MG (50000 UT) capsule Take 1 capsule (50,000 Units total) by mouth once a week. 12/16/22   Golda Lynwood PARAS, MD   hydrOXYzine  (VISTARIL ) 50 MG capsule TAKE 1 CAPSULE(50 MG) BY MOUTH AT BEDTIME AS NEEDED 10/24/23   Melvenia Manus BRAVO, MD  ibuprofen  (ADVIL ) 200 MG tablet Take 800 mg by mouth every 8 (eight) hours as needed (headache).    [provider]  ibuprofen  (ADVIL ) 600 MG tablet Take 1 tablet (600 mg total) by mouth every 6 (six) hours as needed. 05/30/23   Horton, Charmaine FALCON, MD  lidocaine -prilocaine  (EMLA ) cream Apply 1 application  topically once. Prior to procedure.    [provider]  omeprazole  (PRILOSEC) 20 MG capsule TAKE 1 CAPSULE(20 MG) BY MOUTH DAILY 10/24/23   Tobie Suzzane MARLA, MD  tamsulosin  (FLOMAX ) 0.4 MG CAPS capsule Take 1 capsule (0.4 mg total) by mouth daily. 08/29/23   Gerldine Lauraine BROCKS, FNP    Allergies: Penicillins    Review of Systems  Gastrointestinal:  Positive for abdominal pain, nausea and vomiting.  Genitourinary:  Positive for flank pain.  All other systems reviewed and are negative.   Updated Vital Signs BP (!) 155/114   Pulse 88   Temp 97.8 F (36.6 C) (Oral)   Resp (!) 23   Ht 5' 11 (1.803 m)   Wt 122.5 kg   SpO2 96%   BMI 37.66 kg/m   Physical Exam Vitals and nursing note reviewed.  Constitutional:      General: He is not in acute distress.    Appearance: Normal appearance. He  is well-developed. He is not ill-appearing, toxic-appearing or diaphoretic.  HENT:     Head: Normocephalic and atraumatic.     Right Ear: External ear normal.     Left Ear: External ear normal.     Nose: Nose normal.  Eyes:     Extraocular Movements: Extraocular movements intact.     Conjunctiva/sclera: Conjunctivae normal.  Cardiovascular:     Rate and Rhythm: Normal rate and regular rhythm.  Pulmonary:     Effort: Pulmonary effort is normal. No respiratory distress.  Abdominal:     General: There is no distension.     Palpations: Abdomen is soft.     Tenderness: There is no abdominal tenderness.  Musculoskeletal:        General: No swelling. Normal range  of motion.     Cervical back: Normal range of motion and neck supple.  Skin:    General: Skin is warm and dry.     Coloration: Skin is not jaundiced or pale.  Neurological:     General: No focal deficit present.     Mental Status: He is alert and oriented to person, place, and time.  Psychiatric:        Mood and Affect: Mood normal.        Behavior: Behavior normal.     (all labs ordered are listed, but only abnormal results are displayed) Labs Reviewed  COMPREHENSIVE METABOLIC PANEL WITH GFR - Abnormal; Notable for the following components:      Result Value   CO2 19 (*)    Glucose, Bld 131 (*)    All other components within normal limits  CBC WITH DIFFERENTIAL/PLATELET - Abnormal; Notable for the following components:   WBC 13.6 (*)    Neutro Abs 9.0 (*)    All other components within normal limits  URINALYSIS, ROUTINE W REFLEX MICROSCOPIC - Abnormal; Notable for the following components:   APPearance HAZY (*)    Hgb urine dipstick LARGE (*)    Protein, ur 30 (*)    Bacteria, UA RARE (*)    All other components within normal limits  LIPASE, BLOOD  MAGNESIUM    EKG: None  Radiology: CT RENAL STONE STUDY Result Date: 10/06/2024 EXAM: CT UROGRAM 10/06/2024 02:45:14 AM TECHNIQUE: CT of the abdomen and pelvis was performed before and after the administration of intravenous contrast as per CT urogram protocol. Multiplanar reformatted images as well as MIP urogram images are provided for review. Automated exposure control, iterative reconstruction, and/or weight based adjustment of the mA/kV was utilized to reduce the radiation dose to as low as reasonably achievable. COMPARISON: None available. CLINICAL HISTORY: Right-sided flank pain. FINDINGS: LOWER CHEST: No acute abnormality. LIVER: The liver is unremarkable. GALLBLADDER AND BILE DUCTS: The gallbladder has been surgically removed. No biliary ductal dilatation. SPLEEN: The spleen has also been surgically removed. PANCREAS: No  acute abnormality. ADRENAL GLANDS: No acute abnormality. KIDNEYS, URETERS AND BLADDER: Fullness of the collecting system on the right is noted, extending into the ureter. A small 2 mm proximal right ureteral stone is noted. The more distal right ureter is within normal limits. No stones in the left kidney or ureter. No hydronephrosis on the left. No perinephric or periureteral stranding. The bladder is decompressed. GI AND BOWEL: Stomach demonstrates no acute abnormality. Small bowel is unremarkable. No obstructive or inflammatory changes of the colon are seen. The appendix is within normal limits. There is no bowel obstruction. PERITONEUM AND RETROPERITONEUM: No ascites. No free air. VASCULATURE: Aorta is  normal in caliber. LYMPH NODES: No lymphadenopathy. REPRODUCTIVE ORGANS: The prostate is within normal limits. BONES AND SOFT TISSUES: No acute osseous abnormality is noted. Postsurgical changes in the lumbar spine are noted. No focal soft tissue abnormality. IMPRESSION: 1. Small 2 mm proximal right ureteral stone with fullness of the collecting system extending into the ureter, with normal caliber of the more distal ureter. Electronically signed by: Nathan Devonshire MD 10/06/2024 02:57 AM EST RP Workstation: [id number]     Procedures   Medications Ordered in the ED  HYDROmorphone  (DILAUDID ) injection 1 mg (1 mg Intravenous Given 10/06/24 0351)  ondansetron  (ZOFRAN ) injection 4 mg (4 mg Intravenous Given 10/06/24 0347)  ketorolac  (TORADOL ) 15 MG/ML injection 15 mg (15 mg Intravenous Given 10/06/24 0355)  lactated ringers  bolus 1,000 mL (1,000 mLs Intravenous New Bag/Given 10/06/24 0359)  tamsulosin  (FLOMAX ) capsule 0.4 mg (0.4 mg Oral Given 10/06/24 0344)                                    Medical Decision Making Amount and/or Complexity of Data Reviewed Labs: ordered. Radiology: ordered.  Risk Prescription drug management.   This patient presents to the ED for concern of right flank pain,  this involves an extensive number of treatment options, and is a complaint that carries with it a high risk of complications and morbidity.  The differential diagnosis includes nephrolithiasis, colitis, constipation, bowel obstruction, cholecystitis   Co morbidities / Chronic conditions that complicate the patient evaluation  HTN, HLD, migraines, CAD, asthma, nephrolithiasis, lymphoma   Additional history obtained:  Additional history obtained from EMR External records from outside source obtained and reviewed including N/A   Lab Tests:  I Ordered, and personally interpreted labs.  The pertinent results include: Leukocytosis present.  Kidney function and electrolytes are normal.  Urinalysis does not show evidence of infection.   Imaging Studies ordered:  I ordered imaging studies including CT of abdomen and pelvis I independently visualized and interpreted imaging which showed 2 mm proximal right ureteral stone I agree with the radiologist interpretation   Cardiac Monitoring: / EKG:  The patient was maintained on a cardiac monitor.  I personally viewed and interpreted the cardiac monitored which showed an underlying rhythm of: Sinus rhythm   Problem List / ED Course / Critical interventions / Medication management  Patient presenting for right flank pain.  Onset of mild pain was this morning.  Pain worsened in severity this evening and he developed nausea and vomiting.  On arrival in the ED, he does have ongoing nausea.  No significant tenderness is present on exam.  Symptoms are consistent with nephrolithiasis.  Zofran  was ordered for nausea.  Multimodal pain control was ordered.  Workup was initiated.  CT scan confirms right-sided ureteral stone.  Fortunately, size is small.  Patient's lab work shows normal kidney function and no evidence of UTI.  He did have improved symptoms while in the ED.  He was advised to treat symptoms at home, drink 20 fluids, and to follow-up with  urology.  He is stable for discharge at this time. I ordered medication including Dilaudid  and Toradol  for analgesia, IV fluids for hydration, Zofran  for nausea, tamsulosin  for nephrolithiasis Reevaluation of the patient after these medicines showed that the patient improved I have reviewed the patients home medicines and have made adjustments as needed  Social Determinants of Health:  Lives independently     Final diagnoses:  Kidney stone    ED Discharge Orders          Ordered    tamsulosin  (FLOMAX ) 0.4 MG CAPS capsule  Daily        10/06/24 0511    ondansetron  (ZOFRAN -ODT) 4 MG disintegrating tablet  Every 8 hours PRN        10/06/24 0511    oxyCODONE -acetaminophen  (PERCOCET/ROXICET) 5-325 MG tablet  Every 6 hours PRN        10/06/24 0511               Melvenia Motto, MD 10/06/24 [phone]  "

## 2024-10-06 NOTE — Discharge Instructions (Addendum)
 You have a 2 mm kidney stone in your right ureter.  This is the cause of your pain.  Take 600 mg of ibuprofen  every 6 hours.  Additionally, there were prescriptions that were sent to your pharmacy: -Tamsulosin  is to relax the muscle cells in your urine system to also help pass the stone. - Oxycodone  is a narcotic pain medication.  Take this only as needed. -Take ondansetron  as needed for nausea.  Drink lots of fluids to help pass the stone.  Call the telephone number below to schedule a follow-up appointment with urology.  If you have uncontrolled symptoms at home, or any new or worsening symptoms of concern, return to the emergency department.

## 2024-10-08 ENCOUNTER — Telehealth: Payer: Self-pay | Admitting: Urology

## 2024-10-08 NOTE — Telephone Encounter (Signed)
 Called pt to let him know we have forwarded his hospital encounter to MD McKenzie to review. Pt was made aware MD is not in office due to the holidays but someone will reach back out with his recommendations once he returns

## 2024-10-08 NOTE — Telephone Encounter (Signed)
 Has a kidney stone and wants to be seen asap he went to ER 12/21

## 2024-10-22 ENCOUNTER — Telehealth: Payer: Self-pay

## 2024-10-22 DIAGNOSIS — N200 Calculus of kidney: Secondary | ICD-10-CM

## 2024-10-22 NOTE — Telephone Encounter (Signed)
-----   Message from Belvie Clara, MD sent at 10/22/2024  8:28 AM EST ----- KUb in the next week ----- Message ----- From: Sammie Exie HERO, CMA Sent: 10/08/2024   1:51 PM EST To: Belvie LITTIE Clara, MD  Please review and advise on f/u plan

## 2024-10-22 NOTE — Telephone Encounter (Signed)
 Called patient to give MD McKenzie follow up recommendations I left a detailed message stating to goget an xray completed at [facility]

## 2024-10-23 ENCOUNTER — Ambulatory Visit (HOSPITAL_COMMUNITY)
Admission: RE | Admit: 2024-10-23 | Discharge: 2024-10-23 | Disposition: A | Discharge status: Home / Self Care | Source: Ambulatory Visit | Attending: Urology | Admitting: Urology

## 2024-10-23 DIAGNOSIS — N200 Calculus of kidney: Secondary | ICD-10-CM | POA: Diagnosis present

## 2024-10-25 ENCOUNTER — Telehealth: Payer: Self-pay | Admitting: Cardiology

## 2024-10-25 NOTE — Progress Notes (Unsigned)
 Complex Care Management Note Care Guide Note  10/25/2024 Name: Nathan Waters MRN: [id number] DOB: [birth year]   Complex Care Management Outreach Attempts: An unsuccessful telephone outreach was attempted today to offer the patient information about available complex care management services.  Follow Up Plan:  Additional outreach attempts will be made to offer the patient complex care management information and services.   Encounter Outcome:  No Answer  [facility]  [facility] Guide Direct Dial:   Fax: [phone]

## 2024-10-30 NOTE — Telephone Encounter (Signed)
 Called patient to give MD KUB results for patient, patient voiced his understanding
